# Patient Record
Sex: Female | Born: 1940
Health system: Southern US, Community
[De-identification: ages and names within clinical notes are randomized; demographics above are authoritative.]

## PROBLEM LIST (undated history)

## (undated) DIAGNOSIS — R1310 Dysphagia, unspecified: Secondary | ICD-10-CM

## (undated) DIAGNOSIS — C50919 Malignant neoplasm of unspecified site of unspecified female breast: Secondary | ICD-10-CM

## (undated) DIAGNOSIS — R011 Cardiac murmur, unspecified: Secondary | ICD-10-CM

## (undated) DIAGNOSIS — C801 Malignant (primary) neoplasm, unspecified: Secondary | ICD-10-CM

## (undated) DIAGNOSIS — E119 Type 2 diabetes mellitus without complications: Secondary | ICD-10-CM

## (undated) DIAGNOSIS — I499 Cardiac arrhythmia, unspecified: Secondary | ICD-10-CM

## (undated) DIAGNOSIS — I10 Essential (primary) hypertension: Secondary | ICD-10-CM

## (undated) DIAGNOSIS — G4730 Sleep apnea, unspecified: Secondary | ICD-10-CM

## (undated) DIAGNOSIS — F419 Anxiety disorder, unspecified: Secondary | ICD-10-CM

## (undated) DIAGNOSIS — T7840XA Allergy, unspecified, initial encounter: Secondary | ICD-10-CM

## (undated) HISTORY — DX: Sleep apnea, unspecified: G47.30

## (undated) HISTORY — DX: Type 2 diabetes mellitus without complications: E11.9

## (undated) HISTORY — DX: Dysphagia, unspecified: R13.10

## (undated) HISTORY — DX: Allergy, unspecified, initial encounter: T78.40XA

## (undated) HISTORY — DX: Cardiac murmur, unspecified: R01.1

## (undated) HISTORY — DX: Anxiety disorder, unspecified: F41.9

## (undated) HISTORY — DX: Essential (primary) hypertension: I10

## (undated) HISTORY — DX: Cardiac arrhythmia, unspecified: I49.9

## (undated) HISTORY — PX: BREAST EXCISIONAL BIOPSY: SUR124

---

## 2011-06-20 DIAGNOSIS — R05 Cough: Secondary | ICD-10-CM | POA: Insufficient documentation

## 2011-06-20 DIAGNOSIS — R059 Cough, unspecified: Secondary | ICD-10-CM | POA: Insufficient documentation

## 2011-06-20 DIAGNOSIS — I10 Essential (primary) hypertension: Secondary | ICD-10-CM | POA: Insufficient documentation

## 2012-03-15 DIAGNOSIS — M8580 Other specified disorders of bone density and structure, unspecified site: Secondary | ICD-10-CM | POA: Insufficient documentation

## 2012-12-29 DIAGNOSIS — M2390 Unspecified internal derangement of unspecified knee: Secondary | ICD-10-CM | POA: Insufficient documentation

## 2012-12-29 DIAGNOSIS — IMO0002 Reserved for concepts with insufficient information to code with codable children: Secondary | ICD-10-CM | POA: Insufficient documentation

## 2012-12-29 DIAGNOSIS — M1710 Unilateral primary osteoarthritis, unspecified knee: Secondary | ICD-10-CM | POA: Insufficient documentation

## 2013-10-13 DIAGNOSIS — E119 Type 2 diabetes mellitus without complications: Secondary | ICD-10-CM | POA: Insufficient documentation

## 2013-10-13 DIAGNOSIS — E1169 Type 2 diabetes mellitus with other specified complication: Secondary | ICD-10-CM | POA: Insufficient documentation

## 2014-05-12 HISTORY — PX: BREAST LUMPECTOMY: SHX2

## 2014-11-23 DIAGNOSIS — M1A00X0 Idiopathic chronic gout, unspecified site, without tophus (tophi): Secondary | ICD-10-CM | POA: Insufficient documentation

## 2015-02-15 DIAGNOSIS — D0512 Intraductal carcinoma in situ of left breast: Secondary | ICD-10-CM | POA: Insufficient documentation

## 2015-02-15 DIAGNOSIS — D241 Benign neoplasm of right breast: Secondary | ICD-10-CM | POA: Insufficient documentation

## 2015-06-12 DIAGNOSIS — E559 Vitamin D deficiency, unspecified: Secondary | ICD-10-CM | POA: Insufficient documentation

## 2015-06-13 DIAGNOSIS — Z86010 Personal history of colonic polyps: Secondary | ICD-10-CM

## 2015-06-13 DIAGNOSIS — Z860101 Personal history of adenomatous and serrated colon polyps: Secondary | ICD-10-CM

## 2015-06-13 HISTORY — DX: Personal history of adenomatous and serrated colon polyps: Z86.0101

## 2015-06-13 HISTORY — DX: Personal history of colonic polyps: Z86.010

## 2015-07-09 LAB — HM COLONOSCOPY

## 2015-07-17 DIAGNOSIS — K635 Polyp of colon: Secondary | ICD-10-CM | POA: Insufficient documentation

## 2015-07-17 LAB — HM COLONOSCOPY

## 2016-05-01 DIAGNOSIS — E1169 Type 2 diabetes mellitus with other specified complication: Secondary | ICD-10-CM | POA: Insufficient documentation

## 2016-05-01 DIAGNOSIS — E785 Hyperlipidemia, unspecified: Secondary | ICD-10-CM

## 2016-08-24 DIAGNOSIS — E669 Obesity, unspecified: Secondary | ICD-10-CM | POA: Insufficient documentation

## 2017-11-05 ENCOUNTER — Ambulatory Visit: Payer: Medicare Other | Admitting: Cardiovascular Disease

## 2017-11-05 ENCOUNTER — Encounter: Payer: Self-pay | Admitting: Cardiovascular Disease

## 2017-11-05 VITALS — BP 142/62 | HR 66 | Ht 63.0 in | Wt 192.0 lb

## 2017-11-05 DIAGNOSIS — I10 Essential (primary) hypertension: Secondary | ICD-10-CM

## 2017-11-05 DIAGNOSIS — R011 Cardiac murmur, unspecified: Secondary | ICD-10-CM

## 2017-11-05 DIAGNOSIS — E1169 Type 2 diabetes mellitus with other specified complication: Secondary | ICD-10-CM | POA: Diagnosis not present

## 2017-11-05 DIAGNOSIS — E785 Hyperlipidemia, unspecified: Secondary | ICD-10-CM

## 2017-11-05 NOTE — Patient Instructions (Signed)
Your physician recommends that you return for lab work FASTING to check cholesterol.  Your physician has requested that you have an echocardiogram @ [address] - [address]rd [address]. Echocardiography is a painless test that uses sound waves to create images of your heart. It provides your doctor with information about the size and shape of your heart and how well your heart's chambers and valves are working. This procedure takes approximately one hour. There are no restrictions for this procedure.  Your physician discussed the importance of regular exercise and recommended that you start or continue a regular exercise program for good health. Dr. Oval Linsey recommends a goal of 150 minutes of exercise each week.   Your physician recommends that you schedule a follow-up appointment in: [place] with Dr. Oval Linsey

## 2017-11-05 NOTE — Progress Notes (Signed)
Cardiology Office Note   Date:  11/07/2017   ID:  Eleora Sutherland, DOB 12-29-1940, MRN [id number]  PCP:  Deretha Emory, MD  Cardiologist:   Skeet Latch, MD   Chief Complaint  Patient presents with  . New Patient (Initial Visit)      History of Present Illness: Danayah Smyre is a 77 y.o. female with hypertension, breast cancer, and diabetes who is being seen today for the evaluation of a heart murmur at the request of Rodriguez-Southworth, S*.  Ms. Fittro recently moved to [place] from [place].  Her husband passed 02/2017 then her mother passed 06/2017.  She moved to be closer to her sons.  She established care with her PCP and was noted to have a murmur.  Her blood pressure was also elevated, but she was not taking her BP medication at that time.  After the passing of her husband and mother she wasn't taking great care of herself and was sporadic with her medication.  Lately she has been doing much better.  She is seeing a Social worker and is back on a positive course.  She hasn't been getting much exercise but denies exertional chest pain or shortness of breath.  She has mild edema that improves with elevation.  She denies orthopnea or PND.  Her BP at home fluctuates.  Ms. Bluett was seeing a cardiologist in [place] due to poorly controlled hypertension.  She had a stress test around 2017 that was reportedly negative for ischemia.  She was started on metoprolol which has helped her to feel less short of breath.  Past Medical History:  Diagnosis Date  . Diabetes mellitus without complication ([place])   . Hypertension   . Irregular heart beat     History reviewed. No pertinent surgical history.   Current Outpatient Medications  Medication Sig Dispense Refill  . allopurinol (ZYLOPRIM) 100 MG tablet Take 100 mg by mouth daily.  3  . aspirin 81 MG EC tablet Take 1 tablet by mouth daily.    . Biotin 10000 MCG TBDP 1 qd    . buPROPion (WELLBUTRIN XL) 300 MG 24 hr tablet TAKE  ONE TABLET (300 MG DOSE) BY MOUTH DAILY.  3  . cetirizine (ZYRTEC) 10 MG tablet TAKE ONE TABLET (10 MG TOTAL) BY MOUTH DAILY.  9  . Cholecalciferol (VITAMIN D3) 2000 units TABS Take by mouth.    Marland Kitchen glucose blood (ONE TOUCH ULTRA TEST) test strip TEST FASTING BLOOD SUGAR AND AND 2 HOURS AFTER MEALS    . losartan-hydrochlorothiazide (HYZAAR) 100-25 MG tablet Take by mouth.    . metFORMIN (GLUCOPHAGE-XR) 500 MG 24 hr tablet Take by mouth.    . metoprolol succinate (TOPROL-XL) 25 MG 24 hr tablet Take 25 mg by mouth daily.  3  . ONETOUCH DELICA LANCETS 03J MISC TEST GLUCOSE DAILY FASTING AND 2 HOURS AFTER MEALS     No current facility-administered medications for this visit.     Allergies:   Patient has no known allergies.    Social History:  The patient  reports that she has never smoked. She has never used smokeless tobacco. She reports that she does not use drugs.   Family History:  The patient's family history includes COPD in her mother; Cancer in her father; Heart attack in her maternal grandfather; Heart failure in her mother; Rectal cancer in her maternal grandmother.    ROS:  Please see the history of present illness.   Otherwise, review of systems are positive for hair  thinning.   All other systems are reviewed and negative.    PHYSICAL EXAM: VS:  BP (!) 142/62   Pulse 66   Ht 5\' 3"  (1.6 m)   Wt 192 lb (87.1 kg)   BMI 34.01 kg/m  , BMI Body mass index is 34.01 kg/m. GENERAL:  Well appearing HEENT:  Pupils equal round and reactive, fundi not visualized, oral mucosa unremarkable NECK:  No jugular venous distention, waveform within normal limits, carotid upstroke brisk and symmetric, no bruits, no thyromegaly LYMPHATICS:  No cervical adenopathy LUNGS:  Clear to auscultation bilaterally HEART:  RRR.  PMI not displaced or sustained,S1 and S2 within normal limits, no S3, no S4, no clicks, no rubs, III/VI systolic murmur at the LUSB ABD:  Flat, positive bowel sounds normal in  frequency in pitch, no bruits, no rebound, no guarding, no midline pulsatile mass, no hepatomegaly, no splenomegaly EXT:  2 plus pulses throughout, no edema, no cyanosis no clubbing SKIN:  No rashes no nodules NEURO:  Cranial nerves II through XII grossly intact, motor grossly intact throughout PSYCH:  Cognitively intact, oriented to person place and time    EKG:  EKG is not ordered today.  Recent Labs: No results found for requested labs within last 8760 hours.   10/15/2017: TSH 2.06, free T4 1.09 Hemoglobin A1c 6.6% Sodium 141, potassium 3.9, BUN 11, creatinine 0.83  Lipid Panel No results found for: CHOL, TRIG, HDL, CHOLHDL, VLDL, LDLCALC, LDLDIRECT    Wt Readings from Last 3 Encounters:  11/05/17 192 lb (87.1 kg)      ASSESSMENT AND PLAN:  # Murmur:  Systolic murmur on exam.  Concerning for aortic stenosis.  We will get an echo to assess.  She is asymptomatic.  # Hypertension: BP above goal.  She wants to work on diet and exercise before increasing medication. Continue losartan/HCTZ and metoprolol.  # CV Disease Prevention: Check fasting lipids.  Given her DM, she likely needs a statin.    Current medicines are reviewed at length with the patient today.  The patient does not have concerns regarding medicines.  The following changes have been made:  no change  Labs/ tests ordered today include:   Orders Placed This Encounter  Procedures  . Lipid panel  . ECHOCARDIOGRAM COMPLETE     Disposition:   FU with Jana Swartzlander C. Oval Linsey, MD, [facility] in 3 months.      Signed, Mariel Gaudin C. Oval Linsey, MD, [facility]  11/07/2017 11:32 PM    [place]

## 2017-11-07 ENCOUNTER — Encounter: Payer: Self-pay | Admitting: Cardiovascular Disease

## 2017-11-07 DIAGNOSIS — R011 Cardiac murmur, unspecified: Secondary | ICD-10-CM | POA: Insufficient documentation

## 2017-11-07 DIAGNOSIS — F32A Depression, unspecified: Secondary | ICD-10-CM | POA: Insufficient documentation

## 2017-11-07 DIAGNOSIS — F329 Major depressive disorder, single episode, unspecified: Secondary | ICD-10-CM | POA: Insufficient documentation

## 2017-11-17 ENCOUNTER — Other Ambulatory Visit: Payer: Self-pay

## 2017-11-17 ENCOUNTER — Ambulatory Visit (HOSPITAL_COMMUNITY): Payer: Medicare Other | Attending: Cardiovascular Disease

## 2017-11-17 DIAGNOSIS — Z6834 Body mass index (BMI) 34.0-34.9, adult: Secondary | ICD-10-CM | POA: Insufficient documentation

## 2017-11-17 DIAGNOSIS — E669 Obesity, unspecified: Secondary | ICD-10-CM | POA: Insufficient documentation

## 2017-11-17 DIAGNOSIS — E119 Type 2 diabetes mellitus without complications: Secondary | ICD-10-CM | POA: Insufficient documentation

## 2017-11-17 DIAGNOSIS — I10 Essential (primary) hypertension: Secondary | ICD-10-CM | POA: Diagnosis not present

## 2017-11-17 DIAGNOSIS — R011 Cardiac murmur, unspecified: Secondary | ICD-10-CM | POA: Diagnosis not present

## 2017-11-23 ENCOUNTER — Telehealth: Payer: Self-pay | Admitting: Cardiovascular Disease

## 2017-11-23 NOTE — Telephone Encounter (Signed)
Patient made aware of results and verbalized understanding.  Notes recorded by Skeet Latch, MD on 11/18/2017 at 2:16 PM EDT Echo shows that her heart is squeezing well. In fact it squeezes harder than average, which is likely why she has a murmur. No evidence of any significant valvular heart disease.

## 2017-11-23 NOTE — Telephone Encounter (Signed)
New Message     Patient returning nurses call for test results.

## 2018-01-21 DIAGNOSIS — R0683 Snoring: Secondary | ICD-10-CM

## 2018-01-21 DIAGNOSIS — Z1239 Encounter for other screening for malignant neoplasm of breast: Secondary | ICD-10-CM

## 2018-01-21 DIAGNOSIS — I10 Essential (primary) hypertension: Secondary | ICD-10-CM

## 2018-01-21 DIAGNOSIS — Z853 Personal history of malignant neoplasm of breast: Secondary | ICD-10-CM

## 2018-02-03 ENCOUNTER — Other Ambulatory Visit: Payer: Self-pay | Admitting: Internal Medicine

## 2018-02-03 DIAGNOSIS — Z1231 Encounter for screening mammogram for malignant neoplasm of breast: Secondary | ICD-10-CM

## 2018-02-09 ENCOUNTER — Ambulatory Visit: Payer: Medicare Other | Admitting: Cardiovascular Disease

## 2018-02-09 ENCOUNTER — Encounter: Payer: Self-pay | Admitting: Cardiovascular Disease

## 2018-02-09 VITALS — BP 168/80 | HR 54 | Ht 63.0 in | Wt 194.4 lb

## 2018-02-09 DIAGNOSIS — F331 Major depressive disorder, recurrent, moderate: Secondary | ICD-10-CM

## 2018-02-09 DIAGNOSIS — E7800 Pure hypercholesterolemia, unspecified: Secondary | ICD-10-CM | POA: Diagnosis not present

## 2018-02-09 DIAGNOSIS — I10 Essential (primary) hypertension: Secondary | ICD-10-CM | POA: Diagnosis not present

## 2018-02-09 DIAGNOSIS — R011 Cardiac murmur, unspecified: Secondary | ICD-10-CM

## 2018-02-09 MED ORDER — AMLODIPINE BESYLATE 5 MG PO TABS: 5.0000 mg | ORAL_TABLET | Freq: Every day | ORAL | 1 refills | Status: DC

## 2018-02-09 NOTE — Progress Notes (Signed)
Cardiology Office Note   Date:  02/09/2018   ID:  Jasmine Lopez, DOB [birth year], MRN [id number]  PCP:  Deretha Emory, MD  Cardiologist:   Skeet Latch, MD   No chief complaint on file.    History of Present Illness: Jasmine Lopez is a 77 y.o. female with hypertension, breast cancer, and diabetes here for follow up.  She was initially seen 10/2017 for heart murmur.  [name] recently moved to [place] from [place].  Her husband passed 02/2017 then her mother passed 06/2017.  She moved to be closer to her sons.  She established care with her PCP and was noted to have a murmur.  [name] was seeing a cardiologist in [place] due to poorly controlled hypertension.  She had a stress test around 2017 that was reportedly negative for ischemia.  She was started on metoprolol which has helped her to feel less short of breath.  She was referred for an echocardiogram 11/17/2017 that revealed LVEF 65 to 70% and no LVH or intracavitary gradient.  She had no valvular heart disease.  2 weeks ago she decided that her mood had been better so she stopped her Wellbutrin.  Since then she has been feeling very anxious and stressed.  She thinks this is because of the anniversary of her husband's passing.  She was encouraged to start exercising at her last appointment but has been unable to do so.  Her blood pressure has been persistently elevated.  It typically is in the 384Y systolic.  She saw her PCP 01/2018 and was started on atorvastatin due to hyperlipidemia.    Past Medical History:  Diagnosis Date  . Diabetes mellitus without complication ([place])   . Hypertension   . Irregular heart beat     History reviewed. No pertinent surgical history.   Current Outpatient Medications  Medication Sig Dispense Refill  . allopurinol (ZYLOPRIM) 100 MG tablet Take 100 mg by mouth daily.  3  . aspirin 81 MG EC tablet Take 1 tablet by mouth daily.    . Biotin 10000 MCG TBDP 1 qd    . buPROPion  (WELLBUTRIN XL) 300 MG 24 hr tablet TAKE ONE TABLET (300 MG DOSE) BY MOUTH DAILY.  3  . cetirizine (ZYRTEC) 10 MG tablet TAKE ONE TABLET (10 MG TOTAL) BY MOUTH DAILY.  9  . Cholecalciferol (VITAMIN D3) 2000 units TABS Take by mouth.    Marland Kitchen glucose blood (ONE TOUCH ULTRA TEST) test strip TEST FASTING BLOOD SUGAR AND AND 2 HOURS AFTER MEALS    . losartan-hydrochlorothiazide (HYZAAR) 100-25 MG tablet Take by mouth.    . metFORMIN (GLUCOPHAGE-XR) 500 MG 24 hr tablet Take by mouth.    . metoprolol succinate (TOPROL-XL) 25 MG 24 hr tablet Take 25 mg by mouth daily.  3  . ONETOUCH DELICA LANCETS 65L MISC TEST GLUCOSE DAILY FASTING AND 2 HOURS AFTER MEALS    . amLODipine (NORVASC) 5 MG tablet Take 1 tablet (5 mg total) by mouth daily. 90 tablet 1   No current facility-administered medications for this visit.     Allergies:   Patient has no known allergies.    Social History:  The patient  reports that she has never smoked. She has never used smokeless tobacco. She reports that she does not use drugs.   Family History:  The patient's family history includes COPD in her mother; Cancer in her father; Heart attack in her maternal grandfather; Heart failure in her mother; Rectal cancer in  her maternal grandmother.    ROS:  Please see the history of present illness.   Otherwise, review of systems are positive for hair thinning.   All other systems are reviewed and negative.    PHYSICAL EXAM: VS:  BP (!) 168/80   Pulse (!) 54   Ht 5\' 3"  (1.6 m)   Wt 194 lb 6.4 oz (88.2 kg)   BMI 34.44 kg/m  , BMI Body mass index is 34.44 kg/m. GENERAL:  Well appearing HEENT: Pupils equal round and reactive, fundi not visualized, oral mucosa unremarkable NECK:  No jugular venous distention, waveform within normal limits, carotid upstroke brisk and symmetric, no bruits, no thyromegaly LYMPHATICS:  No cervical adenopathy LUNGS:  Clear to auscultation bilaterally HEART:  RRR.  PMI not displaced or sustained,S1 and S2  within normal limits, no S3, no S4, no clicks, no rubs, II/VI systolic murmur at the LUSB ABD:  Flat, positive bowel sounds normal in frequency in pitch, no bruits, no rebound, no guarding, no midline pulsatile mass, no hepatomegaly, no splenomegaly EXT:  2 plus pulses throughout, no edema, no cyanosis no clubbing SKIN:  No rashes no nodules NEURO:  Cranial nerves II through XII grossly intact, motor grossly intact throughout PSYCH:  Cognitively intact, oriented to person place and time   EKG:  EKG is ordered today. 02/09/18: Sinus bradycardia.  Rate 54 bpm.  Nonspecific T wave abnormalities.  Echo 11/17/17: Study Conclusions  - Left ventricle: The cavity size was normal. Systolic function was   hyperdynamic. The estimated ejection fraction was in the range of   70% to 75%. Wall motion was normal; there were no regional wall   motion abnormalities. Features are consistent with a pseudonormal   left ventricular filling pattern, with concomitant abnormal   relaxation and increased filling pressure (grade 2 diastolic   dysfunction). - Mitral valve: Valve area by pressure half-time: 2.22 cm^2. - Left atrium: The atrium was moderately dilated. - Pulmonary arteries: PA peak pressure: 31 mm Hg (S).  Recent Labs: No results found for requested labs within last 8760 hours.   10/15/2017: TSH 2.06, free T4 1.09 Hemoglobin A1c 6.6% Sodium 141, potassium 3.9, BUN 11, creatinine 0.83  01/21/2018: Total cholesterol 198, triglycerides 101, HDL 55, LDL 123  Lipid Panel No results found for: CHOL, TRIG, HDL, CHOLHDL, VLDL, LDLCALC, LDLDIRECT    Wt Readings from Last 3 Encounters:  02/09/18 194 lb 6.4 oz (88.2 kg)  11/05/17 192 lb (87.1 kg)      ASSESSMENT AND PLAN:  # Murmur:  Systolic murmur likely 2/2 hyperdynamic LV.  No evidence of LVOT gradient or valvular disease.    # Hypertension: BP above goal.  She isn't really working on exercise.  She stopped her Wellbutrin and I think that  poorly controlled depression is making it hard for her to exercise.  I suggested that she restart it and talk to her primary care provider before stopping it in the future.  Continue metoprolol, losartan and HCTZ.  We will add amlodipine 5 mg daily.  # Hyperlipidemia: LDL 123 01/2018.  She was appropriately started on atorvastatin.  She will have repeat labs with her PCP in 3 months.   # Depression: Resume Wellbutrin as above.   Current medicines are reviewed at length with the patient today.  The patient does not have concerns regarding medicines.  The following changes have been made: Start amlodipine 5 mg daily.  Labs/ tests ordered today include:   Orders Placed This Encounter  Procedures  . EKG 12-Lead     Disposition:   FU with Shamarie Call C. Oval Linsey, MD, [facility] in 4 months.  PharmD in 1 month.     Signed, Freda Jaquith C. Oval Linsey, MD, [facility]  02/09/2018 5:31 PM    [place]

## 2018-02-09 NOTE — Patient Instructions (Signed)
Medication Instructions:  START AMLODIPINE 5 MG DAILY   Labwork: NONE  Testing/Procedures: NONE  Follow-Up: Your physician recommends that you schedule a follow-up appointment in: [place] physician wants you to follow-up in: [place] [facility]  You will receive a reminder letter in the mail two months in advance. If you don't receive a letter, please call our office to schedule the follow-up appointment.  If you need a refill on your cardiac medications before your next appointment, please call your pharmacy.

## 2018-02-18 ENCOUNTER — Other Ambulatory Visit: Payer: Self-pay | Admitting: Internal Medicine

## 2018-02-18 DIAGNOSIS — Z853 Personal history of malignant neoplasm of breast: Secondary | ICD-10-CM

## 2018-03-09 ENCOUNTER — Ambulatory Visit
Admission: RE | Admit: 2018-03-09 | Discharge: 2018-03-09 | Disposition: A | Discharge status: Home / Self Care | Payer: Medicare Other | Source: Ambulatory Visit | Attending: Internal Medicine | Admitting: Internal Medicine

## 2018-03-09 DIAGNOSIS — Z853 Personal history of malignant neoplasm of breast: Secondary | ICD-10-CM

## 2018-03-16 ENCOUNTER — Ambulatory Visit (INDEPENDENT_AMBULATORY_CARE_PROVIDER_SITE_OTHER): Payer: Medicare Other | Admitting: Pharmacist

## 2018-03-16 VITALS — BP 142/68 | HR 58 | Ht 63.0 in | Wt 194.6 lb

## 2018-03-16 DIAGNOSIS — I10 Essential (primary) hypertension: Secondary | ICD-10-CM

## 2018-03-16 MED ORDER — AMLODIPINE BESYLATE 10 MG PO TABS: 10.0000 mg | ORAL_TABLET | Freq: Every day | ORAL | 1 refills | Status: DC

## 2018-03-16 NOTE — Patient Instructions (Addendum)
Return for a  follow up appointment in 4 weeks  Check your blood pressure at home daily (if able) and keep record of the readings.  Take your BP meds as follows:  *STOP taking amlodipine 5mg * *START taking amlodipine 10mg  daily*  Bring all of your meds, your BP cuff and your record of home blood pressures to your next appointment.  Exercise as you're able, try to walk approximately 30 minutes per day.  Keep salt intake to a minimum, especially watch canned and prepared boxed foods.  Eat more fresh fruits and vegetables and fewer canned items.  Avoid eating in fast food restaurants.    HOW TO TAKE YOUR BLOOD PRESSURE: . Rest 5 minutes before taking your blood pressure. .  Don't smoke or drink caffeinated beverages for at least 30 minutes before. . Take your blood pressure before (not after) you eat. . Sit comfortably with your back supported and both feet on the floor (don't cross your legs). . Elevate your arm to heart level on a table or a desk. . Use the proper sized cuff. It should fit smoothly and snugly around your bare upper arm. There should be enough room to slip a fingertip under the cuff. The bottom edge of the cuff should be 1 inch above the crease of the elbow. . Ideally, take 3 measurements at one sitting and record the average.

## 2018-03-16 NOTE — Progress Notes (Signed)
Patient ID: Audreena Sachdeva                 DOB: 1940-06-13                      MRN: [id number]     HPI: Nury Nebergall is a 77 y.o. female referred by Dr. Oval Linsey to HTN clinic. PMH includes hypertension, breast cancer, murmur, diabetes, and depression. Denies headaches, swelling, no chest pain, or dizziness. She resumed Wellbutrin for depression management but still experiencing some symptoms of depression and difficulty getting out of the house. Reports walking a little bit more this week but not back to her normal regimen of walking 3-4 times per week.  Patient reports compliance with all medication as prescribed.  rrent HTN meds:  Amlodipine 5mg  daily (evening) Losartan-HCTZ 100-25mg  daily (evening) Metoprolol succinate 25mg  daily (evening)  BP goal: <130/80  Family History: patient's family history includes COPD in her mother; Cancer in her father; Heart attack in her maternal grandfather; Heart failure in her mother; Rectal cancer in her maternal grandmother.   Social History: patient  reports that she has never smoked. She has never used smokeless tobacco. She reports that she does not use drugs.   Diet: eating less fatty food and less salty foods. Going out less.  Exercise: not walking lately  Home BP readings: no records available; 150/70 pulse 66 few days ago per patient recollection  Wt Readings from Last 3 Encounters:  03/16/18 194 lb 9.6 oz (88.3 kg)  02/09/18 194 lb 6.4 oz (88.2 kg)  11/05/17 192 lb (87.1 kg)   BP Readings from Last 3 Encounters:  03/16/18 (!) 142/68  02/09/18 (!) 168/80  11/05/17 (!) 142/62   Pulse Readings from Last 3 Encounters:  03/16/18 (!) 58  02/09/18 (!) 54  11/05/17 66    Past Medical History:  Diagnosis Date  . Diabetes mellitus without complication ([place])   . Hypertension   . Irregular heart beat     Current Outpatient Medications on File Prior to Visit  Medication Sig Dispense Refill  . allopurinol (ZYLOPRIM) 100 MG tablet  Take 100 mg by mouth daily.  3  . aspirin 81 MG EC tablet Take 1 tablet by mouth daily.    . Biotin 10000 MCG TBDP 1 qd    . buPROPion (WELLBUTRIN XL) 300 MG 24 hr tablet TAKE ONE TABLET (300 MG DOSE) BY MOUTH DAILY.  3  . cetirizine (ZYRTEC) 10 MG tablet TAKE ONE TABLET (10 MG TOTAL) BY MOUTH DAILY.  9  . Cholecalciferol (VITAMIN D3) 2000 units TABS Take by mouth.    Marland Kitchen glucose blood (ONE TOUCH ULTRA TEST) test strip TEST FASTING BLOOD SUGAR AND AND 2 HOURS AFTER MEALS    . losartan-hydrochlorothiazide (HYZAAR) 100-25 MG tablet Take by mouth.    . metFORMIN (GLUCOPHAGE-XR) 500 MG 24 hr tablet Take by mouth.    . metoprolol succinate (TOPROL-XL) 25 MG 24 hr tablet Take 25 mg by mouth daily.  3  . ONETOUCH DELICA LANCETS 51W MISC TEST GLUCOSE DAILY FASTING AND 2 HOURS AFTER MEALS     No current facility-administered medications on file prior to visit.     No Known Allergies  Blood pressure (!) 142/68, pulse (!) 58, height 5\' 3"  (1.6 m), weight 194 lb 9.6 oz (88.3 kg).  HTN (hypertension) BP remains above desired goal of <130/80 but improved from previous readings. Patient still working on positive lifestyle modifications but having problems to achieve her  goals due to depression. PCP is adjusting her medication for depression and she is trying to eat better and walk more.  Will increase amlodipine dose to 10mg  daily and follow up with HTN clinic in 4 weeks.  Brooke Steinhilber Rodriguez-Guzman PharmD, BCPS, [place] [address] [place] [address] 03/17/2018 7:59 AM

## 2018-03-17 ENCOUNTER — Encounter: Payer: Self-pay | Admitting: Pharmacist

## 2018-03-17 NOTE — Assessment & Plan Note (Signed)
BP remains above desired goal of <130/80 but improved from previous readings. Patient still working on positive lifestyle modifications but having problems to achieve her goals due to depression. PCP is adjusting her medication for depression and she is trying to eat better and walk more.  Will increase amlodipine dose to 10mg  daily and follow up with HTN clinic in 4 weeks.

## 2018-04-08 ENCOUNTER — Other Ambulatory Visit: Payer: Self-pay | Admitting: Cardiovascular Disease

## 2018-04-12 MED ORDER — AMLODIPINE BESYLATE 5 MG PO TABS: 5.0000 mg | ORAL_TABLET | Freq: Every day | ORAL | 3 refills | Status: DC

## 2018-04-13 ENCOUNTER — Ambulatory Visit (INDEPENDENT_AMBULATORY_CARE_PROVIDER_SITE_OTHER): Payer: Medicare Other | Admitting: Pharmacist Clinician (PhC)/ Clinical Pharmacy Specialist

## 2018-04-13 DIAGNOSIS — I10 Essential (primary) hypertension: Secondary | ICD-10-CM | POA: Diagnosis not present

## 2018-04-13 NOTE — Progress Notes (Signed)
Patient ID: Jasmine Lopez                 DOB: 1940/12/10                      MRN: [id number]     HPI: Jasmine Lopez is a 77 y.o. female referred by Dr. Oval Lopez to HTN clinic. PMH includes hypertension, breast cancer, murmur, diabetes, and depression.   Today she is here for follow up and reports feeling much better in the past couple of weeks.  She has managed to get her depression under some control and has been walking more recently.    At her last visit with CVRR, her amlodipine was increased from 5 mg to 10 mg daily.  At that time she took all of her medications at bedtime, but read somewhere that morning was the best, so switched them all to mornings about 10 days ago.  She admits she has not noticed any change in her home blood pressure readings.  Today she denies headaches, swelling, lower extremity edema, chest pain, or dizziness.  Patient reports compliance with all medication as prescribed.  Current HTN meds:  Amlodipine 10mg  daily (am) Losartan-HCTZ 100-25mg  daily (am) - now in 2 tabs - combo currently on backorder at her pharmacy Metoprolol succinate 25mg  daily (am)  BP goal: <130/80  Family History: patient's family history includes COPD in her mother; Cancer in her father; Heart attack in her maternal grandfather; Heart failure in her mother; Rectal cancer in her maternal grandmother.   Social History: patient  reports that she has never smoked. She has never used smokeless tobacco. She reports that she does not use drugs.   Has been drinking 3 mugs of coffee per day recently, but has cut back.  Also likes Coke/Pepsi, still 2-3 cans per week.  No alcohol  Diet: eating less fatty food and less salty foods. Has been eating out less and trying to monitor her diet more carefully  Exercise: started walking in [place] so she can be out of the weather  Home BP readings: no records available;   Wt Readings from Last 3 Encounters:  03/16/18 194 lb 9.6 oz (88.3 kg)    02/09/18 194 lb 6.4 oz (88.2 kg)  11/05/17 192 lb (87.1 kg)   BP Readings from Last 3 Encounters:  03/16/18 (!) 142/68  02/09/18 (!) 168/80  11/05/17 (!) 142/62   Pulse Readings from Last 3 Encounters:  03/16/18 (!) 58  02/09/18 (!) 54  11/05/17 66    Past Medical History:  Diagnosis Date  . Diabetes mellitus without complication ([place])   . Hypertension   . Irregular heart beat     Current Outpatient Medications on File Prior to Visit  Medication Sig Dispense Refill  . allopurinol (ZYLOPRIM) 100 MG tablet Take 100 mg by mouth daily.  3  . amLODipine (NORVASC) 10 MG tablet Take 1 tablet (10 mg total) by mouth daily. 30 tablet 1  . aspirin 81 MG EC tablet Take 1 tablet by mouth daily.    . Biotin 10000 MCG TBDP 1 qd    . buPROPion (WELLBUTRIN XL) 300 MG 24 hr tablet TAKE ONE TABLET (300 MG DOSE) BY MOUTH DAILY.  3  . cetirizine (ZYRTEC) 10 MG tablet TAKE ONE TABLET (10 MG TOTAL) BY MOUTH DAILY.  9  . Cholecalciferol (VITAMIN D3) 2000 units TABS Take by mouth.    Marland Kitchen glucose blood (ONE TOUCH ULTRA TEST) test strip TEST  FASTING BLOOD SUGAR AND AND 2 HOURS AFTER MEALS    . losartan-hydrochlorothiazide (HYZAAR) 100-25 MG tablet Take by mouth.    . metFORMIN (GLUCOPHAGE-XR) 500 MG 24 hr tablet Take by mouth.    . metoprolol succinate (TOPROL-XL) 25 MG 24 hr tablet Take 25 mg by mouth daily.  3  . ONETOUCH DELICA LANCETS 69I MISC TEST GLUCOSE DAILY FASTING AND 2 HOURS AFTER MEALS    . amLODipine (NORVASC) 5 MG tablet Take 1 tablet (5 mg total) by mouth daily. (Patient not taking: Reported on 04/13/2018) 90 tablet 3   No current facility-administered medications on file prior to visit.     No Known Allergies  There were no vitals taken for this visit.  No problem-specific Assessment & Plan notes found for this encounter.  Tommy Medal PharmD CPP [facility] [address] [place] [address] 04/13/2018 10:16 AM

## 2018-04-13 NOTE — Patient Instructions (Addendum)
Return for a a follow up appointment in 6 weeks  Your blood pressure today is 136/62  Check your blood pressure at home daily and keep record of the readings.  Take your BP meds as follows:  AM;  Losartan 100 mg, Hydrochlorothiazide 25 mg  PM:  Amlodipine 10 mg, metoprolol 25 mg  Bring all of your meds, your BP cuff and your record of home blood pressures to your next appointment.  Exercise as you're able, try to walk approximately 30 minutes per day.  Keep salt intake to a minimum, especially watch canned and prepared boxed foods.  Eat more fresh fruits and vegetables and fewer canned items.  Avoid eating in fast food restaurants.    HOW TO TAKE YOUR BLOOD PRESSURE: . Rest 5 minutes before taking your blood pressure. .  Don't smoke or drink caffeinated beverages for at least 30 minutes before. . Take your blood pressure before (not after) you eat. . Sit comfortably with your back supported and both feet on the floor (don't cross your legs). . Elevate your arm to heart level on a table or a desk. . Use the proper sized cuff. It should fit smoothly and snugly around your bare upper arm. There should be enough room to slip a fingertip under the cuff. The bottom edge of the cuff should be 1 inch above the crease of the elbow. . Ideally, take 3 measurements at one sitting and record the average.

## 2018-04-15 ENCOUNTER — Other Ambulatory Visit: Payer: Self-pay | Admitting: Internal Medicine

## 2018-04-15 NOTE — Progress Notes (Signed)
I received a fax from Dr Hyman Hopes chiropractor at [facility] who is not improving from her back pain with adjustments and he recommends MRI of her Thoracic spine without contrast.  Hx f breast CA stage 1. Pt needs to come in for office visit, I asked Tiana to call her and make her apt.

## 2018-04-15 NOTE — Assessment & Plan Note (Signed)
Today her blood pressure is almost to the goal of < 130/80.  I am going to divide her medications so that she takes losartan and hctz in the mornings, amlodipine and metoprolol at night.  She was also given a log sheet with specific instructions on how to check her home blood pressure regularly for [place].  We will see her back in 6-8 weeks for follow up.  She was praised on her increase in physical activity and dietary restraints, especially at this time of the year.

## 2018-04-22 ENCOUNTER — Ambulatory Visit: Payer: Medicare Other | Admitting: Internal Medicine

## 2018-04-22 ENCOUNTER — Encounter: Payer: Self-pay | Admitting: Internal Medicine

## 2018-04-22 VITALS — BP 120/70 | HR 60 | Temp 97.7°F | Ht 63.0 in | Wt 194.8 lb

## 2018-04-22 DIAGNOSIS — R21 Rash and other nonspecific skin eruption: Secondary | ICD-10-CM | POA: Diagnosis not present

## 2018-04-22 DIAGNOSIS — E1165 Type 2 diabetes mellitus with hyperglycemia: Secondary | ICD-10-CM

## 2018-04-22 DIAGNOSIS — I10 Essential (primary) hypertension: Secondary | ICD-10-CM

## 2018-04-22 DIAGNOSIS — M546 Pain in thoracic spine: Secondary | ICD-10-CM | POA: Diagnosis not present

## 2018-04-22 NOTE — Patient Instructions (Addendum)
TRY  STEVIA  OR MONK FRUIT SWEATER WHICH ARE NATURAL SWEETNERS AND NOT ASSOCIATED WITH CANCER.   TRY CHIA SEED  PUDDIN  TRY HYDROCORTISONE CREAM 1% ON YOUR NECK TWICE A DAY FOR 7 DAYS, THEN ONLY AS NEEDED FOR THE NECK RASH Hives Hives (urticaria) are itchy, red, swollen areas on your skin. Hives can show up on any part of your body, and they can vary in size. They can be as small as the tip of a pen or much larger. Hives often fade within 24 hours (acute hives). In other cases, new hives show up after old ones fade. This can continue for many days or weeks (chronic hives). Hives are caused by your body's reaction to an irritant or to something that you are allergic to (trigger). You can get hives right after being around a trigger or hours later. Hives do not spread from person to person (are not contagious). Hives may get worse if you scratch them, if you exercise, or if you have worries (emotional stress). Follow these instructions at home: Medicines  Take or apply over-the-counter and prescription medicines only as told by your doctor.  If you were prescribed an antibiotic medicine, use it as told by your doctor. Do not stop taking the antibiotic even if you start to feel better. Skin Care  Apply cool, wet cloths (cool compresses) to the itchy, red, swollen areas.  Do not scratch your skin. Do not rub your skin. General instructions  Do not take hot showers or baths. This can make itching worse.  Do not wear tight clothes.  Use sunscreen and wear clothing that covers your skin when you are outside.  Avoid any triggers that cause your hives. Keep a journal to help you keep track of what causes your hives. Write down: ? What medicines you take. ? What you eat and drink. ? What products you use on your skin.  Keep all follow-up visits as told by your doctor. This is important. Contact a doctor if:  Your symptoms are not better with medicine.  Your joints are painful or  swollen. Get help right away if:  You have a fever.  You have belly pain.  Your tongue or lips are swollen.  Your eyelids are swollen.  Your chest or throat feels tight.  You have trouble breathing or swallowing. These symptoms may be an emergency. Do not wait to see if the symptoms will go away. Get medical help right away. Call your local emergency services (911 in the [place] Do not drive yourself to the hospital. This information is not intended to replace advice given to you by your health care provider. Make sure you discuss any questions you have with your health care provider. Document Released: 02/05/2008 Document Revised: 10/04/2015 Document Reviewed: 02/14/2015 Elsevier Interactive Patient Education  2018 [employer].

## 2018-04-22 NOTE — Progress Notes (Addendum)
Subjective:     Patient ID: Jasmine Lopez , female    DOB: [birth year] , 77 y.o.   MRN: [id number]   Chief Complaint  Patient presents with  . Hypertension  . Rash    around neck     HPI 1-Pt is here for HTN FU     She met with Dr Atilano Median office and discussed how totake her medications and was split bid instead of taking only ones in the am.  120-136/70 Her glucose has been 125.  2- itchy neck qhs only and gets a rash. She has not been wearing any new jewelry or new clothing. She has not changed her face lotion. Does not get a rash anywhere else.  3- The last time I saw her, we discussed about her getting a sleeping test. She has heard from Neurology for sleep test, but needs to re-schedule this. 4- She has been going to a chiropractor  And has been getting great results with less pain and flexibility, but the Dr is concerned because her mid thoracic pain is not improving and has been 3 months since she has been having visits with them and due to her past hx of breast CA, her chiropractor recommended she gets thoracic MRI and sent a rx with written instruction.   Past Medical History:  Diagnosis Date  . Diabetes mellitus without complication ([place])   . Hypertension   . Irregular heart beat      Family History  Problem Relation Age of Onset  . Heart failure Mother   . COPD Mother   . Cancer Father   . Rectal cancer Maternal Grandmother   . Heart attack Maternal Grandfather     Current Outpatient Medications:  .  allopurinol (ZYLOPRIM) 100 MG tablet, Take 100 mg by mouth daily., Disp: , Rfl: 3 .  amLODipine (NORVASC) 10 MG tablet, Take 1 tablet (10 mg total) by mouth daily., Disp: 30 tablet, Rfl: 1 .  aspirin 81 MG EC tablet, Take 1 tablet by mouth daily., Disp: , Rfl:  .  atorvastatin (LIPITOR) 20 MG tablet, Take by mouth., Disp: , Rfl:  .  Biotin 10000 MCG TBDP, 1 qd, Disp: , Rfl:  .  buPROPion (WELLBUTRIN XL) 300 MG 24 hr tablet, TAKE ONE TABLET (300 MG DOSE) BY MOUTH  DAILY., Disp: , Rfl: 3 .  cetirizine (ZYRTEC) 10 MG tablet, TAKE ONE TABLET (10 MG TOTAL) BY MOUTH DAILY., Disp: , Rfl: 9 .  Cholecalciferol (VITAMIN D3) 2000 units TABS, Take by mouth., Disp: , Rfl:  .  glucose blood (ONE TOUCH ULTRA TEST) test strip, TEST FASTING BLOOD SUGAR AND AND 2 HOURS AFTER MEALS, Disp: , Rfl:  .  losartan-hydrochlorothiazide (HYZAAR) 100-25 MG tablet, Take by mouth., Disp: , Rfl:  .  metFORMIN (GLUCOPHAGE-XR) 500 MG 24 hr tablet, Take by mouth., Disp: , Rfl:  .  metoprolol succinate (TOPROL-XL) 25 MG 24 hr tablet, Take 25 mg by mouth daily., Disp: , Rfl: 3 .  ONETOUCH DELICA LANCETS 25W MISC, TEST GLUCOSE DAILY FASTING AND 2 HOURS AFTER MEALS, Disp: , Rfl:    No Known Allergies   Review of Systems  Constitutional: Negative for appetite change, chills, diaphoresis and fever.  HENT: Negative for tinnitus.   Eyes: Negative for discharge.  Respiratory: Positive for cough. Negative for chest tightness and shortness of breath.        Has been coughing a lot and feels a tickle in her throat off and on, She feels PND, and  drinking water makes it go away.   Cardiovascular: Negative for chest pain, palpitations and leg swelling.       Knee highs support is helping with prevention of edema.   Musculoskeletal: Positive for back pain. Negative for gait problem.  Skin: Positive for rash. Negative for wound.  Neurological: Negative for headaches.       She hit the back on her L occipital scalp 2 weeks ago, no LOC, no N/V, the area was just sore to touch.   Hematological: Negative for adenopathy.  Psychiatric/Behavioral:       + snoring   Today's Vitals   04/22/18 1414  BP: 120/70  Pulse: 60  Temp: 97.7 F (36.5 C)  TempSrc: Oral  SpO2: 95%  Weight: 194 lb 12.8 oz (88.4 kg)  Height: '5\' 3"'  (1.6 m)   Body mass index is 34.51 kg/m.   Objective:  Physical Exam  Constitutional: She is oriented to person, place, and time. She appears well-developed and well-nourished.  No distress.  HENT:  Head: Normocephalic and atraumatic.  Right Ear: External ear normal.  Left Ear: External ear normal.  Nose: Nose normal.  Eyes: Conjunctivae are normal. Right eye exhibits no discharge. Left eye exhibits no discharge. No scleral icterus.  Neck: Neck supple. No thyromegaly present.  No carotid bruits bilaterally  Cardiovascular: Normal rate and regular rhythm.  2/6 murmur heard. Pulmonary/Chest: Effort normal and breath sounds normal. No respiratory distress.  SKIN- mild hives on lower neck only, none on face or thorax.  Musculoskeletal: Normal range of motion. She exhibits no edema.  Lymphadenopathy: She has no cervical adenopathy.  Neurological: She is alert and oriented to person, place, and time.  Skin: Skin is warm and dry. Capillary refill takes less than 2 seconds. She has fine hives on her anterior neck, but none on face or arms. She is not diaphoretic.  Psychiatric: She has a normal mood and affect. Her behavior is normal. Judgment and thought content normal.  Back- has point tenderness on mid thoracic vertebral region Nursing note reviewed. Assessment And Plan:    1. Thoracic spine pain-  - MR Thoracic Spine Wo Contrast; Future  2. Essential hypertension-  - CBC no Diff - CMP14 + Anion Gap  3. Uncontrolled type 2 diabetes mellitus with hyperglycemia ([place])- chronic - Hemoglobin A1c She will continue same meds. We will inform her of her results when they are back.  4- Rash- possibly some allergic reaction. May take Claritin prn and use hydrocortisone over the counter bid as needed.  [name], PA-C

## 2018-04-23 ENCOUNTER — Other Ambulatory Visit: Payer: Self-pay | Admitting: Internal Medicine

## 2018-04-23 LAB — CBC
Hematocrit: 38 % (ref 34.0–46.6)
Hemoglobin: 13.2 g/dL (ref 11.1–15.9)
MCH: 30.2 pg (ref 26.6–33.0)
MCHC: 34.7 g/dL (ref 31.5–35.7)
MCV: 87 fL (ref 79–97)
Platelets: 262 10*3/uL (ref 150–450)
RBC: 4.37 x10E6/uL (ref 3.77–5.28)
RDW: 12.8 % (ref 12.3–15.4)
WBC: 5.6 10*3/uL (ref 3.4–10.8)

## 2018-04-23 LAB — CMP14 + ANION GAP
ALT: 19 IU/L (ref 0–32)
AST: 16 IU/L (ref 0–40)
Albumin/Globulin Ratio: 1.6 (ref 1.2–2.2)
Albumin: 4.1 g/dL (ref 3.5–4.8)
Alkaline Phosphatase: 78 IU/L (ref 39–117)
Anion Gap: 14 mmol/L (ref 10.0–18.0)
BUN/Creatinine Ratio: 15 (ref 12–28)
BUN: 13 mg/dL (ref 8–27)
Bilirubin Total: 0.3 mg/dL (ref 0.0–1.2)
CO2: 24 mmol/L (ref 20–29)
Calcium: 9.3 mg/dL (ref 8.7–10.3)
Chloride: 102 mmol/L (ref 96–106)
Creatinine, Ser: 0.84 mg/dL (ref 0.57–1.00)
GFR calc Af Amer: 79 mL/min/{1.73_m2} (ref >59)
GFR calc non Af Amer: 68 mL/min/{1.73_m2} (ref >59)
Globulin, Total: 2.6 g/dL (ref 1.5–4.5)
Glucose: 105 mg/dL — ABNORMAL HIGH (ref 65–99)
Potassium: 4.4 mmol/L (ref 3.5–5.2)
Sodium: 140 mmol/L (ref 134–144)
Total Protein: 6.7 g/dL (ref 6.0–8.5)

## 2018-04-23 LAB — HEMOGLOBIN A1C
Est. average glucose Bld gHb Est-mCnc: 157 mg/dL
Hgb A1c MFr Bld: 7.1 % — ABNORMAL HIGH (ref 4.8–5.6)

## 2018-04-27 ENCOUNTER — Other Ambulatory Visit: Payer: Self-pay

## 2018-04-29 ENCOUNTER — Other Ambulatory Visit: Payer: Self-pay

## 2018-04-29 MED ORDER — METFORMIN HCL ER 500 MG PO TB24: 500.0000 mg | ORAL_TABLET | Freq: Two times a day (BID) | ORAL | 0 refills | Status: DC

## 2018-05-10 ENCOUNTER — Encounter: Payer: Self-pay | Admitting: Internal Medicine

## 2018-05-25 ENCOUNTER — Ambulatory Visit: Payer: Medicare Other

## 2018-05-26 ENCOUNTER — Other Ambulatory Visit: Payer: Self-pay

## 2018-05-26 MED ORDER — ONETOUCH ULTRA MINI W/DEVICE KIT: PACK | 1 refills | Status: DC

## 2018-06-01 ENCOUNTER — Ambulatory Visit (INDEPENDENT_AMBULATORY_CARE_PROVIDER_SITE_OTHER): Payer: Medicare Other | Admitting: Pharmacist Clinician (PhC)/ Clinical Pharmacy Specialist

## 2018-06-01 DIAGNOSIS — I10 Essential (primary) hypertension: Secondary | ICD-10-CM | POA: Diagnosis not present

## 2018-06-01 MED ORDER — VALSARTAN 160 MG PO TABS: 160.0000 mg | ORAL_TABLET | Freq: Every day | ORAL | 3 refills | Status: DC

## 2018-06-01 MED ORDER — CHLORTHALIDONE 25 MG PO TABS: 12.5000 mg | ORAL_TABLET | Freq: Every day | ORAL | 3 refills | Status: DC

## 2018-06-01 NOTE — Patient Instructions (Signed)
Return for a a follow up appointment with Dr. Oval Linsey on Feb 3  Your blood pressure today is 176/90  Check your blood pressure at home daily (if able) and keep record of the readings.  Take your BP meds as follows:  AM: valsartan 160 mg, chlorthalidone 12.5 mg (1/2 tablet)  PM: amlodipine 10 mg, metoprolol succ 25 mg  Bring all of your meds, your BP cuff and your record of home blood pressures to your next appointment.  Exercise as you're able, try to walk approximately 30 minutes per day.  Keep salt intake to a minimum, especially watch canned and prepared boxed foods.  Eat more fresh fruits and vegetables and fewer canned items.  Avoid eating in fast food restaurants.    HOW TO TAKE YOUR BLOOD PRESSURE: . Rest 5 minutes before taking your blood pressure. .  Don't smoke or drink caffeinated beverages for at least 30 minutes before. . Take your blood pressure before (not after) you eat. . Sit comfortably with your back supported and both feet on the floor (don't cross your legs). . Elevate your arm to heart level on a table or a desk. . Use the proper sized cuff. It should fit smoothly and snugly around your bare upper arm. There should be enough room to slip a fingertip under the cuff. The bottom edge of the cuff should be 1 inch above the crease of the elbow. . Ideally, take 3 measurements at one sitting and record the average.

## 2018-06-01 NOTE — Progress Notes (Signed)
Patient ID: Jasmine Lopez                 DOB: 10/06/1940                      MRN: [id number]     HPI: Jasmine Lopez is a 78 y.o. female referred by Dr. Oval Linsey to HTN clinic. PMH includes hypertension, breast cancer, murmur, diabetes, and depression.   Today she is here for follow up and reports feeling much better in the past couple of weeks.  She has managed to get her depression under some control and has been walking more recently.    At her last visit with [facility], her amlodipine was increased from 5 mg to 10 mg daily.  At that time she took all of her medications at bedtime, but read somewhere that morning was the best, so switched them all to mornings about 10 days ago.  She admits she has not noticed any change in her home blood pressure readings.  Today she denies headaches, swelling, lower extremity edema, chest pain, or dizziness.  Patient reports compliance with all medication as prescribed. Has gained weight over holidays  Was without losartan and hctz for about 1 week, ran out, pharmacy didn't refill, didn't notice.     Current HTN meds:  Amlodipine 59m daily (am) Losartan-HCTZ 100-231mdaily (am) - now in 2 tabs - combo currently on backorder at her pharmacy Metoprolol succinate 2537maily (am)  BP goal: <130/80  Family History: patient's family history includes COPD in her mother; Cancer in her father; Heart attack in her maternal grandfather; Heart failure in her mother; Rectal cancer in her maternal grandmother.   Social History: patient  reports that she has never smoked. She has never used smokeless tobacco. She reports that she does not use drugs.   Has been drinking 3 mugs of coffee per day recently, but has cut back.  Also likes Coke/Pepsi, still 2-3 cans per week.  No alcohol  Diet: eating less fatty food and less salty foods. Has been eating out less and trying to monitor her diet more carefully  Exercise[place] be [place] of  [place] weather  Home BP readings: no records available;  Wt Readings from Last 3 Encounters:  04/22/18 194 lb 12.8 oz (88.4 kg)  03/16/18 194 lb 9.6 oz (88.3 kg)  02/09/18 194 lb 6.4 oz (88.2 kg)   BP Readings from Last 3 Encounters:  06/01/18 (!) 176/90  04/22/18 120/70  04/13/18 136/62   Pulse Readings from Last 3 Encounters:  06/01/18 60  04/22/18 60  04/13/18 75    Past Medical History:  Diagnosis Date  . Diabetes mellitus without complication ([place] . Hypertension   . Irregular heart beat     Current Outpatient Medications on File Prior to Visit  Medication Sig Dispense Refill  . aspirin 81 MG EC tablet Take 1 tablet by mouth daily.    . Blood Glucose Monitoring Suppl (ONE TOUCH ULTRA MINI) w/Device KIT Use as directed to check blood sugars 2 times per day dx:e11.65 1 each 1  . glucose blood (ONE TOUCH ULTRA TEST) test strip TEST FASTING BLOOD SUGAR AND AND 2 HOURS AFTER MEALS    . metFORMIN (GLUCOPHAGE-XR) 500 MG 24 hr tablet Take 1 tablet (500 mg total) by mouth 2 (two) times daily. 180 tablet 0  . ONETOUCH DELICA LANCETS [id number] TEST GLUCOSE DAILY FASTING AND 2  HOURS AFTER MEALS    . allopurinol (ZYLOPRIM) 100 MG tablet Take 100 mg by mouth daily.  3  . amLODipine (NORVASC) 10 MG tablet Take 1 tablet (10 mg total) by mouth daily. (Patient not taking: Reported on 06/01/2018) 30 tablet 1  . atorvastatin (LIPITOR) 20 MG tablet Take by mouth.    . Biotin 10000 MCG TBDP 1 qd    . buPROPion (WELLBUTRIN XL) 300 MG 24 hr tablet TAKE ONE TABLET (300 MG DOSE) BY MOUTH DAILY.  3  . cetirizine (ZYRTEC) 10 MG tablet TAKE ONE TABLET (10 MG TOTAL) BY MOUTH DAILY.  9  . Cholecalciferol (VITAMIN D3) 2000 units TABS Take by mouth.    . hydrochlorothiazide (HYDRODIURIL) 50 MG tablet Take 50 mg by mouth daily.    Marland Kitchen losartan (COZAAR) 50 MG tablet Take 50 mg by mouth daily.    Marland Kitchen losartan-hydrochlorothiazide (HYZAAR) 100-25 MG tablet Take by mouth.    . metoprolol succinate (TOPROL-XL) 25  MG 24 hr tablet Take 25 mg by mouth daily.  3   No current facility-administered medications on file prior to visit.     No Known Allergies  Blood pressure (!) 176/90, pulse 60, resp. rate 14.  HTN (hypertension) Patient with uncontrolled blood pressure today in the office, most likely related to being without losartan and hctz for about 1 week.  Will switch her today to valsartan 160 mg and chlorthalidone 12.5 mg, both once daily.  Hopefully we will see her approach goal readings with this combination, in addition to her amlodipine and metoprolol.  She was encouraged to exercise, has [employer], but has not yet used it.  Suggested she go to her local [employer] and join the group.  She is scheduled to see Dr. Oval Linsey in early February, and we can see her soon afterward if further follow-up is needed.    Tommy Medal PharmD CPP [facility] [address] [place] [address] 06/02/2018 8:30 AM

## 2018-06-02 ENCOUNTER — Encounter: Payer: Self-pay | Admitting: Pharmacist Clinician (PhC)/ Clinical Pharmacy Specialist

## 2018-06-02 NOTE — Assessment & Plan Note (Signed)
Patient with uncontrolled blood pressure today in the office, most likely related to being without losartan and hctz for about 1 week.  Will switch her today to valsartan 160 mg and chlorthalidone 12.5 mg, both once daily.  Hopefully we will see her approach goal readings with this combination, in addition to her amlodipine and metoprolol.  She was encouraged to exercise, has [employer], but has not yet used it.  Suggested she go to her local [employer] and join the group.  She is scheduled to see Dr. Oval Linsey in early February, and we can see her soon afterward if further follow-up is needed.

## 2018-06-03 ENCOUNTER — Other Ambulatory Visit: Payer: Self-pay | Admitting: Internal Medicine

## 2018-06-03 DIAGNOSIS — E1165 Type 2 diabetes mellitus with hyperglycemia: Secondary | ICD-10-CM

## 2018-06-03 MED ORDER — BLOOD GLUCOSE MONITOR KIT: PACK | 0 refills | Status: DC

## 2018-06-08 ENCOUNTER — Other Ambulatory Visit: Payer: Self-pay

## 2018-06-08 MED ORDER — CONTOUR BLOOD GLUCOSE SYSTEM W/DEVICE KIT: PACK | 0 refills | Status: DC

## 2018-06-14 ENCOUNTER — Ambulatory Visit: Payer: Medicare Other | Admitting: Cardiovascular Disease

## 2018-06-15 ENCOUNTER — Ambulatory Visit
Admission: RE | Admit: 2018-06-15 | Discharge: 2018-06-15 | Disposition: A | Discharge status: Home / Self Care | Payer: Medicare Other | Source: Ambulatory Visit | Attending: Internal Medicine | Admitting: Internal Medicine

## 2018-06-15 ENCOUNTER — Ambulatory Visit: Payer: Medicare Other | Admitting: Internal Medicine

## 2018-06-15 ENCOUNTER — Encounter: Payer: Self-pay | Admitting: Internal Medicine

## 2018-06-15 VITALS — BP 142/92 | HR 64 | Temp 97.9°F | Ht 63.0 in | Wt 195.6 lb

## 2018-06-15 DIAGNOSIS — R059 Cough, unspecified: Secondary | ICD-10-CM

## 2018-06-15 DIAGNOSIS — T7840XA Allergy, unspecified, initial encounter: Secondary | ICD-10-CM

## 2018-06-15 DIAGNOSIS — J309 Allergic rhinitis, unspecified: Secondary | ICD-10-CM | POA: Diagnosis not present

## 2018-06-15 DIAGNOSIS — I10 Essential (primary) hypertension: Secondary | ICD-10-CM | POA: Diagnosis not present

## 2018-06-15 DIAGNOSIS — R05 Cough: Secondary | ICD-10-CM | POA: Diagnosis not present

## 2018-06-15 MED ORDER — LORATADINE 10 MG PO TABS: 10.0000 mg | ORAL_TABLET | Freq: Every day | ORAL | 2 refills | Status: DC

## 2018-06-15 MED ORDER — IPRATROPIUM BROMIDE HFA 17 MCG/ACT IN AERS: INHALATION_SPRAY | RESPIRATORY_TRACT | 0 refills | Status: DC

## 2018-06-15 MED ORDER — MONTELUKAST SODIUM 10 MG PO TABS: 10.0000 mg | ORAL_TABLET | Freq: Every day | ORAL | 2 refills | Status: DC

## 2018-06-15 NOTE — Progress Notes (Signed)
Subjective:     Patient ID: Jasmine Lopez , female    DOB: 12-15-1940 , 78 y.o.   MRN: [id number]   Chief Complaint  Patient presents with  . URI    Patient states when she goes outside she has a nonproductive cough. patient states she has some post nasal drip  . Rash    patient states she has some itching on her hands and has some dry skin.    HPI 1-  She  Has hx of chronic cough that comes when wet and cold off and on. If pretty and sunny she sneezing's and coughs. If she changes environments, she starts having a lot of nose congestion. It has happened when she went to a restaurant, anther time when ridding with her son who smokes, but was not smoking in the car. Her schedule has not allowed her to go see ENT. Last time she had a chest xray > 5 + year. She does take generic Zyrtec qd. Wonders if she should switch.  She denies fever, chills or night sweats. Cough is non productive. She does get coughing fits at times. Today she has not coughed. In the past when she did this, her past provider placed her on 2 different antibiotics which did not work, and finally the [employer] worked. So she thought she needs to get a Zpack today.  Past Medical History:  Diagnosis Date  . Diabetes mellitus without complication ([place])   . Hypertension   . Irregular heart beat      Family History  Problem Relation Age of Onset  . Heart failure Mother   . COPD Mother   . Cancer Father   . Rectal cancer Maternal Grandmother   . Heart attack Maternal Grandfather      Current Outpatient Medications:  .  allopurinol (ZYLOPRIM) 100 MG tablet, Take 100 mg by mouth daily., Disp: , Rfl: 3 .  amLODipine (NORVASC) 10 MG tablet, Take 1 tablet (10 mg total) by mouth daily., Disp: 30 tablet, Rfl: 1 .  aspirin 81 MG EC tablet, Take 1 tablet by mouth daily., Disp: , Rfl:  .  atorvastatin (LIPITOR) 20 MG tablet, Take by mouth., Disp: , Rfl:  .  Biotin 10000 MCG TBDP, 1 qd, Disp: , Rfl:  .  blood glucose meter kit and  supplies KIT, Dispense based on patient and insurance preference. Use up to two times daily as directed. (FOR ICD-9 250.00, 250.01)., Disp: 1 each, Rfl: 0 .  Blood Glucose Monitoring Suppl (CONTOUR BLOOD GLUCOSE SYSTEM) w/Device KIT, Check blood sugars two times daily. Please provide patient with glucose meter that is covered by her insurance., Disp: 1 each, Rfl: 0 .  buPROPion (WELLBUTRIN XL) 300 MG 24 hr tablet, TAKE ONE TABLET (300 MG DOSE) BY MOUTH DAILY., Disp: , Rfl: 3 .  cetirizine (ZYRTEC) 10 MG tablet, TAKE ONE TABLET (10 MG TOTAL) BY MOUTH DAILY., Disp: , Rfl: 9 .  chlorthalidone (HYGROTON) 25 MG tablet, Take 0.5 tablets (12.5 mg total) by mouth daily., Disp: 15 tablet, Rfl: 3 .  Cholecalciferol (VITAMIN D3) 2000 units TABS, Take by mouth., Disp: , Rfl:  .  glucose blood (ONE TOUCH ULTRA TEST) test strip, TEST FASTING BLOOD SUGAR AND AND 2 HOURS AFTER MEALS, Disp: , Rfl:  .  losartan-hydrochlorothiazide (HYZAAR) 100-25 MG tablet, Take by mouth., Disp: , Rfl:  .  metFORMIN (GLUCOPHAGE-XR) 500 MG 24 hr tablet, Take 1 tablet (500 mg total) by mouth 2 (two) times daily., Disp: 180 tablet,  Rfl: 0 .  metoprolol succinate (TOPROL-XL) 25 MG 24 hr tablet, Take 25 mg by mouth daily., Disp: , Rfl: 3 .  ONETOUCH DELICA LANCETS 62M MISC, TEST GLUCOSE DAILY FASTING AND 2 HOURS AFTER MEALS, Disp: , Rfl:    Allergies  Allergen Reactions  . Benadryl [Diphenhydramine] Itching    Benadryl cream     Review of Systems  Constitutional: Negative for chills, diaphoresis and fever.  HENT: Positive for postnasal drip, rhinorrhea and sneezing. Negative for congestion, ear discharge, ear pain and sore throat.   Eyes: Negative for discharge.  Respiratory: Positive for cough and chest tightness. Negative for shortness of breath, wheezing and stridor.   Cardiovascular: Negative for chest pain, palpitations and leg swelling.  Musculoskeletal: Negative for myalgias.  Skin: Negative for rash.   Allergic/Immunologic: Positive for environmental allergies.  Neurological: Negative for dizziness and headaches.  Hematological: Negative for adenopathy.     Today's Vitals   06/15/18 1111  BP: (!) 142/92  Pulse: 64  Temp: 97.9 F (36.6 C)  TempSrc: Oral  SpO2: 96%  Weight: 195 lb 9.6 oz (88.7 kg)  Height: '5\' 3"'  (1.6 m)  PainSc: 0-No pain   Body mass index is 34.65 kg/m.   Objective:  Physical Exam Vitals signs and nursing note reviewed.  Constitutional:      General: She is not in acute distress.    Appearance: Normal appearance. She is not toxic-appearing.  HENT:     Head: Normocephalic.     Right Ear: Tympanic membrane, ear canal and external ear normal.     Left Ear: Tympanic membrane, ear canal and external ear normal.     Nose: Rhinorrhea present. No congestion.     Mouth/Throat:     Pharynx: Oropharyngeal exudate present. No posterior oropharyngeal erythema.  Eyes:     General: No scleral icterus.       Right eye: Discharge present.        Left eye: No discharge.     Conjunctiva/sclera: Conjunctivae normal.  Neck:     Musculoskeletal: Neck supple. No neck rigidity.  Cardiovascular:     Rate and Rhythm: Normal rate and regular rhythm.     Heart sounds: No murmur.  Pulmonary:     Effort: Pulmonary effort is normal.     Breath sounds: Normal breath sounds.  Musculoskeletal: Normal range of motion.  Lymphadenopathy:     Cervical: No cervical adenopathy.  Skin:    General: Skin is warm and dry.     Coloration: Skin is not jaundiced.     Findings: No rash.  Neurological:     Mental Status: She is alert and oriented to person, place, and time.     Coordination: Coordination normal.     Gait: Gait normal.  Psychiatric:        Mood and Affect: Mood normal.        Behavior: Behavior normal.        Thought Content: Thought content normal.        Judgment: Judgment normal.         Assessment And Plan:    1. Cough- chronic with bronchospasms and provoked  with exposure to environment. I will have her switch her Zyrtec to Loratadine, and I also placed her on Singulair 0 mg qd, and Atrovent HFA to use prior to going to the new environments or cold.  - Ambulatory referral to ENT - DG Chest 2 View; Future  2. Allergic state, initial encounter- a new ENT  referral placed.  - Ambulatory referral to ENT  We will inform her when the CXR report is back.        Mollyann Halbert RODRIGUEZ-SOUTHWORTH, PA-C

## 2018-06-18 ENCOUNTER — Encounter: Payer: Self-pay | Admitting: Cardiology

## 2018-06-18 ENCOUNTER — Ambulatory Visit: Payer: Medicare Other | Admitting: Cardiology

## 2018-06-18 DIAGNOSIS — E785 Hyperlipidemia, unspecified: Secondary | ICD-10-CM

## 2018-06-18 DIAGNOSIS — I10 Essential (primary) hypertension: Secondary | ICD-10-CM | POA: Diagnosis not present

## 2018-06-18 DIAGNOSIS — E1169 Type 2 diabetes mellitus with other specified complication: Secondary | ICD-10-CM

## 2018-06-18 MED ORDER — CARVEDILOL 6.25 MG PO TABS: 6.2500 mg | ORAL_TABLET | Freq: Two times a day (BID) | ORAL | 2 refills | Status: DC

## 2018-06-18 NOTE — Assessment & Plan Note (Signed)
On oral agents 

## 2018-06-18 NOTE — Progress Notes (Addendum)
06/18/2018 Jasmine Lopez   1941-04-06  [id number]  Primary Physician Glendale Chard, MD Primary Cardiologist: Dr Lucretia Field  HPI: [name] is a pleasant 78 year old female followed by Dr. Baird Cancer and Dr. Oval Linsey with a history of hypertension, non-insulin-dependent diabetes, and dyslipidemia.  Echocardiogram in July 2018 showed normal LV function.  She has been followed at our hypertensive clinic and we have had some difficulty in getting her blood pressure under good control.  Today she tells me that she is having a hard time cutting the chlorthalidone in half and does not want to take it anymore.  Her pressure at home runs in the 675 systolic.  Blood pressure by me with a large cuff was 152/72.  She denies any unusual chest pain or shortness of breath.  She has chronic bradycardia and is on low-dose Toprol.   Current Outpatient Medications  Medication Sig Dispense Refill  . allopurinol (ZYLOPRIM) 100 MG tablet Take 100 mg by mouth daily.  3  . amLODipine (NORVASC) 10 MG tablet Take 1 tablet (10 mg total) by mouth daily. 30 tablet 1  . Biotin 10000 MCG TBDP 1 qd    . blood glucose meter kit and supplies KIT Dispense based on patient and insurance preference. Use up to two times daily as directed. (FOR ICD-9 250.00, 250.01). 1 each 0  . buPROPion (WELLBUTRIN XL) 300 MG 24 hr tablet TAKE ONE TABLET (300 MG DOSE) BY MOUTH DAILY.  3  . Cholecalciferol (VITAMIN D3) 2000 units TABS Take by mouth.    Marland Kitchen glucose blood (ONE TOUCH ULTRA TEST) test strip TEST FASTING BLOOD SUGAR AND AND 2 HOURS AFTER MEALS    . ipratropium (ATROVENT HFA) 17 MCG/ACT inhaler 2 puffs before going out in the cold or different enviroment that causes coughs. 1 Inhaler 0  . loratadine (CLARITIN) 10 MG tablet Take 1 tablet (10 mg total) by mouth daily. 30 tablet 2  . losartan-hydrochlorothiazide (HYZAAR) 100-25 MG tablet Take by mouth.    . metFORMIN (GLUCOPHAGE-XR) 500 MG 24 hr tablet Take 1 tablet (500 mg total) by mouth 2  (two) times daily. 180 tablet 0  . montelukast (SINGULAIR) 10 MG tablet Take 1 tablet (10 mg total) by mouth daily. 30 tablet 2  . ONETOUCH DELICA LANCETS 91M MISC TEST GLUCOSE DAILY FASTING AND 2 HOURS AFTER MEALS    . carvedilol (COREG) 6.25 MG tablet Take 1 tablet (6.25 mg total) by mouth 2 (two) times daily. 60 tablet 2   No current facility-administered medications for this visit.     Allergies  Allergen Reactions  . Ace Inhibitors Cough  . Benadryl [Diphenhydramine] Itching    Benadryl cream    Past Medical History:  Diagnosis Date  . Diabetes mellitus without complication ([place])   . Hypertension   . Irregular heart beat     Social History   Socioeconomic History  . Marital status: Widowed    Spouse name: Not on file  . Number of children: Not on file  . Years of education: Not on file  . Highest education level: Not on file  Occupational History  . Not on file  Social Needs  . Financial resource strain: Not on file  . Food insecurity:    Worry: Not on file    Inability: Not on file  . Transportation needs:    Medical: Not on file    Non-medical: Not on file  Tobacco Use  . Smoking status: Never Smoker  . Smokeless tobacco: Never Used  Substance and Sexual Activity  . Alcohol use: Never    Frequency: Never  . Drug use: Never  . Sexual activity: Not on file  Lifestyle  . Physical activity:    Days per week: Not on file    Minutes per session: Not on file  . Stress: Not on file  Relationships  . Social connections:    Talks on phone: Not on file    Gets together: Not on file    Attends religious service: Not on file    Active member of club or organization: Not on file    Attends meetings of clubs or organizations: Not on file    Relationship status: Not on file  . Intimate partner violence:    Fear of current or ex partner: Not on file    Emotionally abused: Not on file    Physically abused: Not on file    Forced sexual activity: Not on file    Other Topics Concern  . Not on file  Social History Narrative  . Not on file     Family History  Problem Relation Age of Onset  . Heart failure Mother   . COPD Mother   . Cancer Father   . Rectal cancer Maternal Grandmother   . Heart attack Maternal Grandfather      Review of Systems: General: negative for chills, fever, night sweats or weight changes.  Cardiovascular: negative for chest pain, dyspnea on exertion, edema, orthopnea, palpitations, paroxysmal nocturnal dyspnea or shortness of breath Dermatological: negative for rash Respiratory: negative for wheezing Urologic: negative for hematuria Abdominal: negative for nausea, vomiting, diarrhea, bright red blood per rectum, melena, or hematemesis Neurologic: negative for visual changes, syncope, or dizziness All other systems reviewed and are otherwise negative except as noted above.    Blood pressure (!) 157/77, pulse 87, height '5\' 3"'  (1.6 m), weight 196 lb 6.4 oz (89.1 kg), SpO2 97 %.  General appearance: alert, cooperative, no distress and mildly obese Lungs: clear to auscultation bilaterally Heart: regular rate and rhythm with 2/6 systolic murmur AOV and LSB Extremities: no edema Skin: Skin color, texture, turgor normal. No rashes or lesions Neurologic: Grossly normal   ASSESSMENT AND PLAN:   Essential hypertension D/c benazapril - cough 06/20/11 She is having a hard time cutting chlorthalidone -will stop this Try Coreg 6.25 mg BID instead of Toprol- may have more effect of B/P but we probably won't be able to titrate secondary to bradycardia.   Type 2 diabetes mellitus with hyperlipidemia (HCC) On oral agents   PLAN  Coreg 6.25 mg BID- stop Toprol, stop chlorthalidone, stop ASA.  F/U in a few weeks, if her B/P remains high consider adding clonidine.   Kerin Ransom PA-C 06/18/2018 3:57 PM

## 2018-06-18 NOTE — Patient Instructions (Addendum)
Medication Instructions:  START Coreg 6.25mg  take 1 tablet twice a day   STOP Metoprolol STOP Aspirin  STOP Chlorthalidone If you need a refill on your cardiac medications before your next appointment, please call your pharmacy.   Lab work: None  If you have labs (blood work) drawn today and your tests are completely normal, you will receive your results only by: Marland Kitchen MyChart Message (if you have MyChart) OR . A paper copy in the mail If you have any lab test that is abnormal or we need to change your treatment, we will call you to review the results.  Testing/Procedures: None   Follow-Up: At [facility], you and your health needs are our priority.  As part of our continuing mission to provide you with exceptional heart care, we have created designated Provider Care Teams.  These Care Teams include your primary Cardiologist (physician) and Advanced Practice Providers (APPs -  Physician Assistants and Nurse Practitioners) who all work together to provide you with the care you need, when you need it. . Your physician recommends that you schedule a follow-up appointment in: 3-4 weeks with Kerin Ransom, PA-C  Any Other Special Instructions Will Be Listed Below (If Applicable).

## 2018-06-18 NOTE — Assessment & Plan Note (Signed)
D/c benazapril - cough 06/20/11 She is having a hard time cutting chlorthalidone -will stop this Try Coreg 6.25 mg BID instead of Toprol- may have more effect of B/P but we probably won't be able to titrate secondary to bradycardia.

## 2018-06-24 ENCOUNTER — Other Ambulatory Visit: Payer: Self-pay | Admitting: Cardiology

## 2018-06-24 MED ORDER — LOSARTAN POTASSIUM-HCTZ 100-25 MG PO TABS: 1.0000 | ORAL_TABLET | Freq: Every day | ORAL | 5 refills | Status: DC

## 2018-06-24 NOTE — Telephone Encounter (Signed)
New Message     *STAT* If patient is at the pharmacy, call can be transferred to refill team.   1. Which medications need to be refilled? (please list name of each medication and dose if known) losartan  2. Which pharmacy/location (including street and city if local pharmacy) is medication to be sent to? [facility] [address]   3. Do they need a 30 day or 90 day supply? 30   Patient states that [facility] does not have any losartan in stock needs rx sent to [facility] but it should just be losartan only.

## 2018-06-24 NOTE — Telephone Encounter (Signed)
Rx(s) sent to pharmacy electronically.  

## 2018-06-28 ENCOUNTER — Encounter: Payer: Self-pay | Admitting: Nurse Practitioner

## 2018-06-28 ENCOUNTER — Ambulatory Visit: Payer: Medicare Other | Admitting: Nurse Practitioner

## 2018-06-28 VITALS — BP 150/82 | HR 65 | Temp 97.7°F | Ht 63.8 in | Wt 198.8 lb

## 2018-06-28 DIAGNOSIS — I10 Essential (primary) hypertension: Secondary | ICD-10-CM

## 2018-06-28 DIAGNOSIS — R059 Cough, unspecified: Secondary | ICD-10-CM

## 2018-06-28 DIAGNOSIS — J209 Acute bronchitis, unspecified: Secondary | ICD-10-CM | POA: Diagnosis not present

## 2018-06-28 DIAGNOSIS — R05 Cough: Secondary | ICD-10-CM

## 2018-06-28 MED ORDER — PREDNISONE 10 MG (21) PO TBPK: ORAL_TABLET | ORAL | 0 refills | Status: DC

## 2018-06-28 MED ORDER — MY MDI PORTABLE NEBULISER MISC: 1.0000 | 0 refills | Status: DC

## 2018-06-28 NOTE — Progress Notes (Signed)
Subjective:     Patient ID: Jasmine Lopez , female    DOB: July 01, 1940 , 78 y.o.   MRN: [id number]   Chief Complaint  Patient presents with  . URI    patient states she has some chest congestion and a productive cough. patient states when she uses her inhaler it causes her not to be able to talk as much. patient denies having any fever patient states she is currently using her inhaler and mucinex.    HPI  Blood sugar was 108 this morning.   URI   This is a new problem. The current episode started in the past 7 days. The problem has been gradually worsening. There has been no fever. Associated symptoms include coughing. Pertinent negatives include no chest pain or wheezing. She has tried nothing for the symptoms. The treatment provided no relief.     Past Medical History:  Diagnosis Date  . Diabetes mellitus without complication ([place])   . Hypertension   . Irregular heart beat      Family History  Problem Relation Age of Onset  . Heart failure Mother   . COPD Mother   . Cancer Father   . Rectal cancer Maternal Grandmother   . Heart attack Maternal Grandfather      Current Outpatient Medications:  .  allopurinol (ZYLOPRIM) 100 MG tablet, Take 100 mg by mouth daily., Disp: , Rfl: 3 .  amLODipine (NORVASC) 10 MG tablet, Take 1 tablet (10 mg total) by mouth daily., Disp: 30 tablet, Rfl: 1 .  Biotin 10000 MCG TBDP, 1 qd, Disp: , Rfl:  .  blood glucose meter kit and supplies KIT, Dispense based on patient and insurance preference. Use up to two times daily as directed. (FOR ICD-9 250.00, 250.01)., Disp: 1 each, Rfl: 0 .  buPROPion (WELLBUTRIN XL) 300 MG 24 hr tablet, TAKE ONE TABLET (300 MG DOSE) BY MOUTH DAILY., Disp: , Rfl: 3 .  carvedilol (COREG) 6.25 MG tablet, Take 1 tablet (6.25 mg total) by mouth 2 (two) times daily., Disp: 60 tablet, Rfl: 2 .  Cholecalciferol (VITAMIN D3) 2000 units TABS, Take by mouth., Disp: , Rfl:  .  glucose blood (ONE TOUCH ULTRA TEST) test strip,  TEST FASTING BLOOD SUGAR AND AND 2 HOURS AFTER MEALS, Disp: , Rfl:  .  ipratropium (ATROVENT HFA) 17 MCG/ACT inhaler, 2 puffs before going out in the cold or different enviroment that causes coughs., Disp: 1 Inhaler, Rfl: 0 .  loratadine (CLARITIN) 10 MG tablet, Take 1 tablet (10 mg total) by mouth daily., Disp: 30 tablet, Rfl: 2 .  losartan-hydrochlorothiazide (HYZAAR) 100-25 MG tablet, Take 1 tablet by mouth daily., Disp: 30 tablet, Rfl: 5 .  metFORMIN (GLUCOPHAGE-XR) 500 MG 24 hr tablet, Take 1 tablet (500 mg total) by mouth 2 (two) times daily., Disp: 180 tablet, Rfl: 0 .  montelukast (SINGULAIR) 10 MG tablet, Take 1 tablet (10 mg total) by mouth daily., Disp: 30 tablet, Rfl: 2 .  ONETOUCH DELICA LANCETS 30Q MISC, TEST GLUCOSE DAILY FASTING AND 2 HOURS AFTER MEALS, Disp: , Rfl:    Allergies  Allergen Reactions  . Ace Inhibitors Cough  . Benadryl [Diphenhydramine] Itching    Benadryl cream     Review of Systems  Constitutional: Negative.   Eyes: Negative for photophobia.  Respiratory: Positive for cough. Negative for chest tightness and wheezing.   Cardiovascular: Negative.  Negative for chest pain, palpitations and leg swelling.     Today's Vitals   06/28/18 1143  BP: Marland Kitchen)  150/82  Pulse: 65  Temp: 97.7 F (36.5 C)  TempSrc: Oral  SpO2: 97%  Weight: 198 lb 12.8 oz (90.2 kg)  Height: 5' 3.8" (1.621 m)  PainSc: 0-No pain   Body mass index is 34.34 kg/m.   Objective:  Physical Exam Constitutional:      Appearance: Normal appearance. She is not ill-appearing.  Cardiovascular:     Rate and Rhythm: Normal rate and regular rhythm.     Pulses: Normal pulses.     Heart sounds: Normal heart sounds. No murmur.  Pulmonary:     Effort: Pulmonary effort is normal.     Breath sounds: Examination of the right-upper field reveals wheezing. Examination of the left-upper field reveals wheezing. Examination of the right-lower field reveals wheezing. Examination of the left-lower field  reveals wheezing. Wheezing present.     Comments: Dry cough noted Neurological:     General: No focal deficit present.     Mental Status: She is alert and oriented to person, place, and time.  Psychiatric:        Mood and Affect: Mood normal.        Behavior: Behavior normal.        Thought Content: Thought content normal.        Judgment: Judgment normal.         Assessment And Plan:     1. Cough  Dry cough noted and mild wheezing noted bilaterally  Will send an MDI to see if helps with a better inhale  Will also treat with prednisone taper advised to monitor blood sugar - Nebulizers (MY MDI PORTABLE NEBULISER) MISC; 1 each by Does not apply route as directed.  Dispense: 1 each; Refill: 0 - predniSONE (STERAPRED UNI-[name] 21 TAB) 10 MG (21) TBPK tablet; Take as directed  Dispense: 21 tablet; Refill: 0  2. Acute bronchitis, unspecified organism  See #1 - predniSONE (STERAPRED UNI-[name] 21 TAB) 10 MG (21) TBPK tablet; Take as directed  Dispense: 21 tablet; Refill: 0       [name], FNP

## 2018-06-29 ENCOUNTER — Ambulatory Visit: Payer: Medicare Other | Admitting: Internal Medicine

## 2018-06-30 ENCOUNTER — Telehealth: Payer: Self-pay | Admitting: Cardiology

## 2018-06-30 MED ORDER — LOSARTAN POTASSIUM 100 MG PO TABS: 100.0000 mg | ORAL_TABLET | Freq: Every day | ORAL | 3 refills | Status: DC

## 2018-06-30 MED ORDER — HYDROCHLOROTHIAZIDE 25 MG PO TABS: 25.0000 mg | ORAL_TABLET | Freq: Every day | ORAL | 3 refills | Status: DC

## 2018-06-30 NOTE — Telephone Encounter (Signed)
Try splitting it in 2 prescriptions.   Losartan 100mg  daily  HCTZ 25mg  daily

## 2018-06-30 NOTE — Telephone Encounter (Signed)
New Message   Pt c/o medication issue:  1. Name of Medication: Losartan -hydrochlorothiazide 100-25mg   2. How are you currently taking this medication (dosage and times per day)? Currently not taking  3. Are you having a reaction (difficulty breathing--STAT)? No  4. What is your medication issue? PT says no drug store has the Losartan and she has not taken it at all because she has never been able to get it filled. She has been taking Zalsartan.160mg   PT states Kerin Ransom wanted to change her from the Zalsartan to Losartan but she cant get it filled   Please call

## 2018-06-30 NOTE — Telephone Encounter (Signed)
Please advise, patient has seen pharmacy in the past for HTN, she is having issues getting her Losartan/HCTZ filled from her pharmacy, they are telling her it is on backorder every time she tries to fill it.  Any recommendations on another type of medication she could use while this is on backorder? Thank you!

## 2018-06-30 NOTE — Telephone Encounter (Signed)
Called patient, she agreed to attempt to have the 2 separate RX's.  Patient medication sent to pharmacy.  Med list updated.

## 2018-07-06 ENCOUNTER — Encounter: Payer: Self-pay | Admitting: Nurse Practitioner

## 2018-07-09 ENCOUNTER — Other Ambulatory Visit: Payer: Self-pay | Admitting: Internal Medicine

## 2018-07-11 ENCOUNTER — Other Ambulatory Visit: Payer: Self-pay | Admitting: Internal Medicine

## 2018-07-14 ENCOUNTER — Other Ambulatory Visit: Payer: Self-pay

## 2018-07-14 ENCOUNTER — Ambulatory Visit: Payer: Medicare Other | Admitting: Cardiology

## 2018-07-14 ENCOUNTER — Encounter: Payer: Self-pay | Admitting: Cardiology

## 2018-07-14 DIAGNOSIS — R011 Cardiac murmur, unspecified: Secondary | ICD-10-CM

## 2018-07-14 DIAGNOSIS — I5189 Other ill-defined heart diseases: Secondary | ICD-10-CM | POA: Insufficient documentation

## 2018-07-14 DIAGNOSIS — I10 Essential (primary) hypertension: Secondary | ICD-10-CM

## 2018-07-14 DIAGNOSIS — E119 Type 2 diabetes mellitus without complications: Secondary | ICD-10-CM

## 2018-07-14 DIAGNOSIS — K429 Umbilical hernia without obstruction or gangrene: Secondary | ICD-10-CM | POA: Insufficient documentation

## 2018-07-14 NOTE — Assessment & Plan Note (Signed)
On oral agents 

## 2018-07-14 NOTE — Assessment & Plan Note (Signed)
>>  ASSESSMENT AND PLAN FOR NON-INSULIN DEPENDENT TYPE 2 DIABETES MELLITUS (HCC) WRITTEN ON 07/14/2018  4:17 PM BY KILROY, LUKE K, PA-C  On oral agents

## 2018-07-14 NOTE — Assessment & Plan Note (Signed)
Benign outflow tract murmur secondary to hyperdynamic LVF-EF 70-75%

## 2018-07-14 NOTE — Patient Instructions (Signed)
Medication Instructions:  Your physician recommends that you continue on your current medications as directed. Please refer to the Current Medication list given to you today. If you need a refill on your cardiac medications before your next appointment, please call your pharmacy.   Lab work: None  If you have labs (blood work) drawn today and your tests are completely normal, you will receive your results only by: . MyChart Message (if you have MyChart) OR . A paper copy in the mail If you have any lab test that is abnormal or we need to change your treatment, we will call you to review the results.  Testing/Procedures: None   Follow-Up: At [facility], you and your health needs are our priority.  As part of our continuing mission to provide you with exceptional heart care, we have created designated Provider Care Teams.  These Care Teams include your primary Cardiologist (physician) and Advanced Practice Providers (APPs -  Physician Assistants and Nurse Practitioners) who all work together to provide you with the care you need, when you need it. You will need a follow up appointment in 6 months.  Please call our office 2 months in advance to schedule this appointment.  You may see Tiffany Silverton, MD or one of the following Advanced Practice Providers on your designated Care Team:   Luke Kilroy, PA-C Krista Kroeger, PA-C . Callie Goodrich, PA-C  Any Other Special Instructions Will Be Listed Below (If Applicable).    

## 2018-07-14 NOTE — Assessment & Plan Note (Signed)
Better control on her current regimen.

## 2018-07-14 NOTE — Progress Notes (Signed)
07/14/2018 Jasmine Lopez   1940/07/07  [id number]  Primary Physician Glendale Chard, MD Primary Cardiologist: Dr Oval Linsey  HPI: Patient is a pleasant 78 year old female with a history of hypertension, non-insulin-dependent diabetes, and dyslipidemia.  Prior echocardiogram in July 2018 showed preserved LV function.  We seen her off and on for hypertension.  She developed a cough with an ACE inhibitor and was changed to an ARB.  She has some baseline bradycardia and was not able to tolerate higher doses of Toprol in the past.  I saw her in the office 06/18/2018.  Her blood pressure was not well controlled.  She was having a hard time with chlorthalidone, it was too small to cut.  I suggested we stop her Toprol, add carvedilol 6.25 twice daily, stop her chlorthalidone and stop her aspirin.  She is back in the office today in follow-up.  Since I saw her last she has been doing well.  She does have an upper respiratory type issue, she even saw an ENT doctor, eventually she was told it was nothing serious and that she needs to "rest her vocal cords".   Current Outpatient Medications  Medication Sig Dispense Refill  . allopurinol (ZYLOPRIM) 100 MG tablet Take 100 mg by mouth daily.  3  . amLODipine (NORVASC) 10 MG tablet Take 1 tablet (10 mg total) by mouth daily. 30 tablet 1  . Biotin 10000 MCG TBDP 1 qd    . blood glucose meter kit and supplies KIT Dispense based on patient and insurance preference. Use up to two times daily as directed. (FOR ICD-9 250.00, 250.01). 1 each 0  . buPROPion (WELLBUTRIN XL) 300 MG 24 hr tablet TAKE ONE TABLET (300 MG DOSE) BY MOUTH DAILY.  3  . carvedilol (COREG) 6.25 MG tablet Take 1 tablet (6.25 mg total) by mouth 2 (two) times daily. 60 tablet 2  . Cholecalciferol (VITAMIN D3) 2000 units TABS Take by mouth.    Marland Kitchen glucose blood (ONE TOUCH ULTRA TEST) test strip TEST FASTING BLOOD SUGAR AND AND 2 HOURS AFTER MEALS    . hydrochlorothiazide (HYDRODIURIL) 25 MG tablet Take  1 tablet (25 mg total) by mouth daily. 90 tablet 3  . ipratropium (ATROVENT HFA) 17 MCG/ACT inhaler INHALE 2 PUFFS BEFORE GOING OUT IN THE COLD OR DIFFERENT ENVIROMENT THAT CAUSES COUGHS. 12.9 Inhaler 0  . loratadine (CLARITIN) 10 MG tablet Take 1 tablet (10 mg total) by mouth daily. 30 tablet 2  . losartan (COZAAR) 100 MG tablet Take 1 tablet (100 mg total) by mouth daily. 90 tablet 3  . metFORMIN (GLUCOPHAGE-XR) 500 MG 24 hr tablet TAKE 1 TABLET BY MOUTH DAILY WITH EVENING MEAL 90 tablet 0  . montelukast (SINGULAIR) 10 MG tablet Take 1 tablet (10 mg total) by mouth daily. 30 tablet 2  . ONETOUCH DELICA LANCETS 41Y MISC TEST GLUCOSE DAILY FASTING AND 2 HOURS AFTER MEALS     No current facility-administered medications for this visit.     Allergies  Allergen Reactions  . Ace Inhibitors Cough  . Benadryl [Diphenhydramine] Itching    Benadryl cream    Past Medical History:  Diagnosis Date  . Diabetes mellitus without complication ([place])   . Hypertension   . Irregular heart beat     Social History   Socioeconomic History  . Marital status: Widowed    Spouse name: Not on file  . Number of children: Not on file  . Years of education: Not on file  . Highest education level:  Not on file  Occupational History  . Not on file  Social Needs  . Financial resource strain: Not on file  . Food insecurity:    Worry: Not on file    Inability: Not on file  . Transportation needs:    Medical: Not on file    Non-medical: Not on file  Tobacco Use  . Smoking status: Never Smoker  . Smokeless tobacco: Never Used  Substance and Sexual Activity  . Alcohol use: Never    Frequency: Never  . Drug use: Never  . Sexual activity: Not on file  Lifestyle  . Physical activity:    Days per week: Not on file    Minutes per session: Not on file  . Stress: Not on file  Relationships  . Social connections:    Talks on phone: Not on file    Gets together: Not on file    Attends religious service:  Not on file    Active member of club or organization: Not on file    Attends meetings of clubs or organizations: Not on file    Relationship status: Not on file  . Intimate partner violence:    Fear of current or ex partner: Not on file    Emotionally abused: Not on file    Physically abused: Not on file    Forced sexual activity: Not on file  Other Topics Concern  . Not on file  Social History Narrative  . Not on file     Family History  Problem Relation Age of Onset  . Heart failure Mother   . COPD Mother   . Cancer Father   . Rectal cancer Maternal Grandmother   . Heart attack Maternal Grandfather      Review of Systems: General: negative for chills, fever, night sweats or weight changes.  Cardiovascular: negative for chest pain, dyspnea on exertion, edema, orthopnea, palpitations, paroxysmal nocturnal dyspnea or shortness of breath Dermatological: negative for rash Respiratory: negative for cough or wheezing Urologic: negative for hematuria Abdominal: negative for nausea, vomiting, diarrhea, bright red blood per rectum, melena, or hematemesis Neurologic: negative for visual changes, syncope, or dizziness All other systems reviewed and are otherwise negative except as noted above.    Blood pressure 137/70, pulse 68, height _0  (1.6 m), weight 195 lb (88.5 kg).  General appearance: alert, cooperative, no distress and mildly obese Neck: no JVD and thyroid not enlarged, symmetric, no tenderness/mass/nodules Lungs: clear to auscultation bilaterally Heart: regular rate and rhythm and systolic ejection murmur Extremities: extremities normal, atraumatic, no cyanosis or edema and no edema Skin: Skin color, texture, turgor normal. No rashes or lesions Neurologic: Grossly normal   ASSESSMENT AND PLAN:   Essential hypertension Better control on her current regimen.   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  Kerin Ransom PA-C 07/14/2018 4:17 PM

## 2018-07-15 DIAGNOSIS — R490 Dysphonia: Secondary | ICD-10-CM | POA: Insufficient documentation

## 2018-07-15 DIAGNOSIS — T7840XA Allergy, unspecified, initial encounter: Secondary | ICD-10-CM | POA: Insufficient documentation

## 2018-07-15 HISTORY — DX: Dysphonia: R49.0

## 2018-07-22 ENCOUNTER — Ambulatory Visit: Payer: Medicare Other

## 2018-07-22 ENCOUNTER — Ambulatory Visit: Payer: Medicare Other | Admitting: Internal Medicine

## 2018-07-29 ENCOUNTER — Ambulatory Visit: Payer: Medicare Other | Admitting: Internal Medicine

## 2018-07-29 ENCOUNTER — Ambulatory Visit: Payer: Medicare Other

## 2018-08-01 ENCOUNTER — Other Ambulatory Visit: Payer: Self-pay | Admitting: Internal Medicine

## 2018-08-10 ENCOUNTER — Other Ambulatory Visit: Payer: Self-pay | Admitting: Internal Medicine

## 2018-08-10 ENCOUNTER — Telehealth: Payer: Self-pay | Admitting: Internal Medicine

## 2018-08-10 NOTE — Telephone Encounter (Signed)
I called the patient about scheduling virtual AWV via video.  She said that she will try to get her son to help her and then call back. VDM (DD)

## 2018-09-06 ENCOUNTER — Other Ambulatory Visit: Payer: Self-pay | Admitting: Internal Medicine

## 2018-09-13 ENCOUNTER — Other Ambulatory Visit: Payer: Self-pay | Admitting: Cardiology

## 2018-09-15 ENCOUNTER — Telehealth: Payer: Self-pay | Admitting: Internal Medicine

## 2018-09-15 NOTE — Telephone Encounter (Signed)
I called the patient and left a message asking her to call me to schedule virtual AWV if interested. VDM (DD)

## 2018-09-17 ENCOUNTER — Other Ambulatory Visit: Payer: Self-pay | Admitting: Internal Medicine

## 2018-09-17 ENCOUNTER — Other Ambulatory Visit: Payer: Self-pay | Admitting: Cardiovascular Disease

## 2018-09-17 NOTE — Telephone Encounter (Deleted)
Amlodipine 5 mg refilled. 

## 2018-10-02 ENCOUNTER — Other Ambulatory Visit: Payer: Self-pay | Admitting: Internal Medicine

## 2018-10-14 ENCOUNTER — Ambulatory Visit (INDEPENDENT_AMBULATORY_CARE_PROVIDER_SITE_OTHER): Payer: Medicare Other

## 2018-10-14 ENCOUNTER — Other Ambulatory Visit: Payer: Self-pay

## 2018-10-14 ENCOUNTER — Ambulatory Visit: Payer: Medicare Other | Admitting: Internal Medicine

## 2018-10-14 ENCOUNTER — Ambulatory Visit
Admission: RE | Admit: 2018-10-14 | Discharge: 2018-10-14 | Disposition: A | Discharge status: Home / Self Care | Payer: Medicare Other | Source: Ambulatory Visit | Attending: Internal Medicine | Admitting: Internal Medicine

## 2018-10-14 ENCOUNTER — Other Ambulatory Visit: Payer: Self-pay | Admitting: Internal Medicine

## 2018-10-14 ENCOUNTER — Encounter: Payer: Self-pay | Admitting: Internal Medicine

## 2018-10-14 VITALS — BP 148/78 | HR 66 | Temp 98.2°F | Ht 62.8 in | Wt 192.0 lb

## 2018-10-14 DIAGNOSIS — E1169 Type 2 diabetes mellitus with other specified complication: Secondary | ICD-10-CM

## 2018-10-14 DIAGNOSIS — M25512 Pain in left shoulder: Secondary | ICD-10-CM

## 2018-10-14 DIAGNOSIS — E785 Hyperlipidemia, unspecified: Secondary | ICD-10-CM | POA: Diagnosis not present

## 2018-10-14 DIAGNOSIS — R682 Dry mouth, unspecified: Secondary | ICD-10-CM | POA: Insufficient documentation

## 2018-10-14 DIAGNOSIS — Z0000 Encounter for general adult medical examination without abnormal findings: Secondary | ICD-10-CM

## 2018-10-14 DIAGNOSIS — R05 Cough: Secondary | ICD-10-CM | POA: Diagnosis not present

## 2018-10-14 DIAGNOSIS — M898X1 Other specified disorders of bone, shoulder: Secondary | ICD-10-CM

## 2018-10-14 DIAGNOSIS — E1165 Type 2 diabetes mellitus with hyperglycemia: Secondary | ICD-10-CM

## 2018-10-14 DIAGNOSIS — R8290 Unspecified abnormal findings in urine: Secondary | ICD-10-CM

## 2018-10-14 DIAGNOSIS — E782 Mixed hyperlipidemia: Secondary | ICD-10-CM

## 2018-10-14 DIAGNOSIS — M65331 Trigger finger, right middle finger: Secondary | ICD-10-CM

## 2018-10-14 DIAGNOSIS — R059 Cough, unspecified: Secondary | ICD-10-CM

## 2018-10-14 DIAGNOSIS — R0781 Pleurodynia: Secondary | ICD-10-CM

## 2018-10-14 DIAGNOSIS — R413 Other amnesia: Secondary | ICD-10-CM

## 2018-10-14 DIAGNOSIS — I10 Essential (primary) hypertension: Secondary | ICD-10-CM

## 2018-10-14 LAB — POCT URINALYSIS DIPSTICK
Bilirubin, UA: NEGATIVE
Blood, UA: NEGATIVE
Glucose, UA: NEGATIVE
Ketones, UA: NEGATIVE
Nitrite, UA: NEGATIVE
Protein, UA: NEGATIVE
Spec Grav, UA: 1.025 (ref 1.010–1.025)
Urobilinogen, UA: 0.2 E.U./dL
pH, UA: 6 (ref 5.0–8.0)

## 2018-10-14 LAB — POCT UA - MICROALBUMIN
Albumin/Creatinine Ratio, Urine, POC: <30
Creatinine, POC: 300 mg/dL
Microalbumin Ur, POC: 10 mg/L

## 2018-10-14 NOTE — Patient Instructions (Signed)

## 2018-10-14 NOTE — Addendum Note (Signed)
Addended by: Kellie Simmering on: 10/14/2018 01:42 PM   Modules accepted: Orders

## 2018-10-14 NOTE — Progress Notes (Signed)
Subjective:   Jasmine Lopez is a 78 y.o. female who presents for Medicare Annual (Subsequent) preventive examination.  Review of Systems:  n/a Cardiac Risk Factors include: advanced age (>60mn, >>64women);hypertension;diabetes mellitus;obesity (BMI >30kg/m2)     Objective:     Vitals: BP (!) 148/78 (BP Location: Left Arm, Patient Position: Sitting, Cuff Size: Normal)   Pulse 66   Temp 98.2 F (36.8 C) (Oral)   Ht 5' 2.8" (1.595 m)   Wt 192 lb (87.1 kg)   SpO2 99%   BMI 34.23 kg/m   Body mass index is 34.23 kg/m.  Advanced Directives 10/14/2018  Does Patient Have a Medical Advance Directive? Yes  Type of Advance Directive [place]? No - copy requested    Tobacco Social History   Tobacco Use  Smoking Status Never Smoker  Smokeless Tobacco Never Used     Counseling given: Not Answered   Clinical Intake:  Pre-visit preparation completed: Yes  Pain : No/denies pain Pain Score: 0-No pain     Nutritional Status: BMI > 30  Obese Nutritional Risks: None Diabetes: Yes CBG done?: No Did pt. bring in CBG monitor from home?: No  How often do you need to have someone help you when you read instructions, pamphlets, or other written materials from your doctor or pharmacy?: 1 - Never What is the last grade level you completed in school?: Master's Degree  Interpreter Needed?: No  Information entered by :: NAllen LPN  Past Medical History:  Diagnosis Date  . Diabetes mellitus without complication ([place]   . Hypertension   . Irregular heart beat    History reviewed. No pertinent surgical history. Family History  Problem Relation Age of Onset  . Heart failure Mother   . COPD Mother   . Cancer Father   . Rectal cancer Maternal Grandmother   . Heart attack Maternal Grandfather    Social History   Socioeconomic History  . Marital status: Widowed    Spouse name: Not on file  . Number of  children: Not on file  . Years of education: Not on file  . Highest education level: Not on file  Occupational History  . Occupation: retired  SScientific laboratory technician . Financial resource strain: Not hard at all  . Food insecurity:    Worry: Never true    Inability: Never true  . Transportation needs:    Medical: No    Non-medical: No  Tobacco Use  . Smoking status: Never Smoker  . Smokeless tobacco: Never Used  Substance and Sexual Activity  . Alcohol use: Never    Frequency: Never  . Drug use: Never  . Sexual activity: Not Currently  Lifestyle  . Physical activity:    Days per week: 2 days    Minutes per session: 30 min  . Stress: Not at all  Relationships  . Social connections:    Talks on phone: Not on file    Gets together: Not on file    Attends religious service: Not on file    Active member of club or organization: Not on file    Attends meetings of clubs or organizations: Not on file    Relationship status: Not on file  Other Topics Concern  . Not on file  Social History Narrative  . Not on file    Outpatient Encounter Medications as of 10/14/2018  Medication Sig  . allopurinol (ZYLOPRIM) 100 MG tablet TAKE  1 TABLET BY MOUTH EVERY DAY  . amLODipine (NORVASC) 10 MG tablet Take 1 tablet (10 mg total) by mouth daily.  . Biotin 10000 MCG TBDP 1 qd  . blood glucose meter kit and supplies KIT Dispense based on patient and insurance preference. Use up to two times daily as directed. (FOR ICD-9 250.00, 250.01).  Marland Kitchen buPROPion (WELLBUTRIN XL) 300 MG 24 hr tablet TAKE ONE TABLET (300 MG DOSE) BY MOUTH DAILY.  . carvedilol (COREG) 6.25 MG tablet TAKE 1 TABLET BY MOUTH TWICE A DAY  . chlorthalidone (HYGROTON) 25 MG tablet   . Cholecalciferol (VITAMIN D3) 2000 units TABS Take by mouth.  Marland Kitchen glucose blood (ONE TOUCH ULTRA TEST) test strip TEST FASTING BLOOD SUGAR AND AND 2 HOURS AFTER MEALS  . loratadine (CLARITIN) 10 MG tablet TAKE 1 TABLET BY MOUTH EVERY DAY  . metFORMIN  (GLUCOPHAGE-XR) 500 MG 24 hr tablet TAKE 1 TABLET BY MOUTH DAILY WITH EVENING MEAL  . ONETOUCH DELICA LANCETS 96V MISC TEST GLUCOSE DAILY FASTING AND 2 HOURS AFTER MEALS  . hydrochlorothiazide (HYDRODIURIL) 25 MG tablet Take 1 tablet (25 mg total) by mouth daily.  Marland Kitchen ipratropium (ATROVENT HFA) 17 MCG/ACT inhaler INHALE 2 PUFFS BEFORE GOING OUT IN THE COLD OR DIFFERENT ENVIROMENT THAT CAUSES COUGHS. (Patient not taking: Reported on 10/14/2018)  . losartan (COZAAR) 100 MG tablet Take 1 tablet (100 mg total) by mouth daily.  . montelukast (SINGULAIR) 10 MG tablet TAKE 1 TABLET BY MOUTH EVERY DAY (Patient not taking: Reported on 10/14/2018)   No facility-administered encounter medications on file as of 10/14/2018.     Activities of Daily Living In your present state of health, do you have any difficulty performing the following activities: 10/14/2018  Hearing? N  Vision? N  Difficulty concentrating or making decisions? N  Walking or climbing stairs? N  Dressing or bathing? N  Doing errands, shopping? N  Preparing Food and eating ? N  Using the Toilet? N  In the past six months, have you accidently leaked urine? N  Do you have problems with loss of bowel control? N  Managing your Medications? N  Managing your Finances? N  Housekeeping or managing your Housekeeping? N    Patient Care Team: Glendale Chard, MD as PCP - General (Internal Medicine) Skeet Latch, MD as PCP - Cardiology (Cardiology)    Assessment:   This is a routine wellness examination for Adan.  Exercise Activities and Dietary recommendations Current Exercise Habits: Home exercise routine, Type of exercise: walking, Time (Minutes): 30, Frequency (Times/Week): 2, Weekly Exercise (Minutes/Week): 60  Goals    . Weight (lb) < 200 lb (90.7 kg)     Wants to lose 20 pounds       Fall Risk Fall Risk  10/14/2018 06/15/2018 04/22/2018  Falls in the past year? 0 0 0  Risk for fall due to : Medication side effect - -  Follow  up Falls prevention discussed;Education provided - -   Is the patient's home free of loose throw rugs in walkways, pet beds, electrical cords, etc?   yes      Grab bars in the bathroom? no      Handrails on the stairs?   yes      Adequate lighting?   yes  Timed Get Up and Go performed: n/a  Depression Screen PHQ 2/9 Scores 10/14/2018 06/15/2018 04/22/2018  PHQ - 2 Score 0 0 0  PHQ- 9 Score 3 - -     Cognitive Function  6CIT Screen 10/14/2018  What Year? 0 points  What month? 0 points  What time? 0 points  Count back from 20 0 points  Months in reverse 0 points  Repeat phrase 0 points  Total Score 0     There is no immunization history on file for this patient.  Qualifies for Shingles Vaccine? yes  Screening Tests Health Maintenance  Topic Date Due  . FOOT EXAM  08/31/1952  . OPHTHALMOLOGY EXAM  08/31/1952  . URINE MICROALBUMIN  08/31/1952  . TETANUS/TDAP  08/31/1961  . DEXA SCAN  09/01/2007  . PNA vac Low Risk Adult (1 of 2 - PCV13) 09/01/2007  . HEMOGLOBIN A1C  10/22/2018  . INFLUENZA VACCINE  12/11/2018    Cancer Screenings: Lung: Low Dose CT Chest recommended if Age 82-80 years, 30 pack-year currently smoking OR have quit w/in 15years. Patient does not qualify. Breast:  Up to date on Mammogram? Yes   Up to date of Bone Density/Dexa? Yes Colorectal: not required  Additional Screenings: : Hepatitis C Screening: n/a     Plan:    Wants to lose 20 pounds. Declines vaccinations   I have personally reviewed and noted the following in the patient's chart:   . Medical and social history . Use of alcohol, tobacco or illicit drugs  . Current medications and supplements . Functional ability and status . Nutritional status . Physical activity . Advanced directives . List of other physicians . Hospitalizations, surgeries, and ER visits in previous 12 months . Vitals . Screenings to include cognitive, depression, and falls . Referrals and appointments  In  addition, I have reviewed and discussed with patient certain preventive protocols, quality metrics, and best practice recommendations. A written personalized care plan for preventive services as well as general preventive health recommendations were provided to patient.     Kellie Simmering, LPN  05/12/32

## 2018-10-14 NOTE — Progress Notes (Addendum)
Subjective:     Patient ID: Jasmine Lopez , female    DOB: February 04, 1941 , 78 y.o.   MRN: [id number]   Chief Complaint  Patient presents with  . Hypertension  . Diabetes    HPI Pt is here for DM and HTN FU and medicare wellness visit with our LPN. She is accompanied by her son.  Her fasting glucose  Has been 130-140 BP's 135-140/ 68-70 She has hx of intermittent dry eyes and mouth, and her lids have been getting itching and saline and antihistanmines is not helpin 2- L trap/ scapula pain off and on x 6 month. Chiropractor visits have helped but recommended her to get an xray of her L shoulder due to her breast cancer hx.  3- has been dealing with chronic cough and her dentist gave her a zpack for dental infection and seemed to help her cough, but has returned. She also has been having itching of both her eye lids and has dry mouth all the time. She did not research and wonders if she has Sjogren's and would like to be tested.   Past Medical History:  Diagnosis Date  . Diabetes mellitus without complication ([place])   . Hypertension   . Irregular heart beat      Family History  Problem Relation Age of Onset  . Heart failure Mother   . COPD Mother   . Cancer Father   . Rectal cancer Maternal Grandmother   . Heart attack Maternal Grandfather      Current Outpatient Medications:  .  allopurinol (ZYLOPRIM) 100 MG tablet, TAKE 1 TABLET BY MOUTH EVERY DAY, Disp: 90 tablet, Rfl: 0 .  amLODipine (NORVASC) 10 MG tablet, Take 1 tablet (10 mg total) by mouth daily., Disp: 30 tablet, Rfl: 1 .  Biotin 10000 MCG TBDP, 1 qd, Disp: , Rfl:  .  blood glucose meter kit and supplies KIT, Dispense based on patient and insurance preference. Use up to two times daily as directed. (FOR ICD-9 250.00, 250.01)., Disp: 1 each, Rfl: 0 .  buPROPion (WELLBUTRIN XL) 300 MG 24 hr tablet, TAKE ONE TABLET (300 MG DOSE) BY MOUTH DAILY., Disp: , Rfl: 3 .  carvedilol (COREG) 6.25 MG tablet, TAKE 1 TABLET BY MOUTH  TWICE A DAY, Disp: 180 tablet, Rfl: 3 .  chlorthalidone (HYGROTON) 25 MG tablet, , Disp: , Rfl:  .  Cholecalciferol (VITAMIN D3) 2000 units TABS, Take by mouth., Disp: , Rfl:  .  glucose blood (ONE TOUCH ULTRA TEST) test strip, TEST FASTING BLOOD SUGAR AND AND 2 HOURS AFTER MEALS, Disp: , Rfl:  .  hydrochlorothiazide (HYDRODIURIL) 25 MG tablet, Take 1 tablet (25 mg total) by mouth daily., Disp: 90 tablet, Rfl: 3 .  ipratropium (ATROVENT HFA) 17 MCG/ACT inhaler, INHALE 2 PUFFS BEFORE GOING OUT IN THE COLD OR DIFFERENT ENVIROMENT THAT CAUSES COUGHS. (Patient not taking: Reported on 10/14/2018), Disp: 12.9 Inhaler, Rfl: 0 .  loratadine (CLARITIN) 10 MG tablet, TAKE 1 TABLET BY MOUTH EVERY DAY, Disp: 90 tablet, Rfl: 0 .  losartan (COZAAR) 100 MG tablet, Take 1 tablet (100 mg total) by mouth daily., Disp: 90 tablet, Rfl: 3 .  metFORMIN (GLUCOPHAGE-XR) 500 MG 24 hr tablet, TAKE 1 TABLET BY MOUTH DAILY WITH EVENING MEAL, Disp: 90 tablet, Rfl: 0 .  montelukast (SINGULAIR) 10 MG tablet, TAKE 1 TABLET BY MOUTH EVERY DAY (Patient not taking: Reported on 10/14/2018), Disp: 90 tablet, Rfl: 0 .  ONETOUCH DELICA LANCETS 51W MISC, TEST GLUCOSE DAILY FASTING AND  2 HOURS AFTER MEALS, Disp: , Rfl:    Allergies  Allergen Reactions  . Ace Inhibitors Cough  . Benadryl [Diphenhydramine] Itching    Benadryl cream     Review of Systems  Review of Systems  Constitutional: Negative for diaphoresis and unexpected weight change.  HENT: Negative for tinnitus.   Eyes: Negative for visual disturbance.  Respiratory: Negative for chest tightness and shortness of breath.   Cardiovascular: Negative for chest pain, palpitations and leg swelling.  Gastrointestinal: Negative for constipation, diarrhea and nausea.  Endocrine: Negative for polydipsia, polyphagia and polyuria.  Genitourinary: Negative for dysuria and frequency.  Skin: Negative for rash and wound. Foot exam shows no wounds, calluses or decreased sensation.   Musculoske- has pain on L mid scapula and below it, and not getting better. See HPI Neurological: Negative for dizziness, speech difficulty, weakness, numbness and headaches. Son is concerned about her memory because she asks him some times things she they already talked about. Today's Vitals   10/14/18 1251  BP: (!) 148/78  Pulse: 66  Temp: 98.2 F (36.8 C)  TempSrc: Oral  SpO2: 99%  Weight: 192 lb (87.1 kg)  Height: 5' 2.8" (1.595 m)   Body mass index is 34.23 kg/m.   Objective:  Physical Exam  Constitutional: She is oriented to person, place, and time. She appears well-developed and well-nourished. No distress.  HENT:  Head: Normocephalic and atraumatic.  Right Ear: External ear normal.  Left Ear: External ear normal.  Nose: Nose normal.  Eyes: Conjunctivae are normal. Right eye exhibits no discharge. Left eye exhibits no discharge. No scleral icterus.her upper eye lids are dray but not scaly and slightly erythematous. They are not hot or red.   Neck: Neck supple. No thyromegaly present.  No carotid bruits bilaterally  Cardiovascular: Normal rate and regular rhythm.  No murmur heard. Pulmonary/Chest: Effort normal and breath sounds normal. No respiratory distress.  Musculoskeletal: Normal range of motion. She exhibits no edema. Feet with no wounds, ulcers or calluses.  Lymphadenopathy: she has no cervical adenopathy.  Neurological: She is alert and oriented to person, place, and time.  Skin: Skin is warm and dry. Capillary refill takes less than 2 seconds. No rash noted. She is not diaphoretic.  Psychiatric: She has a normal mood and affect. Her behavior is normal. Judgment and thought content normal.  Nursing note reviewed.    Assessment And Plan:  1. Cough- chronic. Had neg xray 06/2018. Seems to be allergy related.   2. Essential hypertension- not optimal control yet. I will await on her labs before I make any changes on her meds.  - CBC no Diff - CMP14 + Anion Gap -  Lipid Profile  3. Uncontrolled type 2 diabetes mellitus with hyperglycemia (HCC) chronic - Hemoglobin A1c - Ambulatory referral to Ophthalmology  4. Dry mouth, unspecified- chronic  - Sjogren's syndrome antibods(ssa + ssb) - Allergens(96) Foods  5. Memory change- normal MMS today.     Advised to do brain exercises and son will look for an app that is offered online.   6. Trigger finger, right middle finger- new. Offered to send her to hand specialist and I reviewed the treatments they could offer her, but she will hold off on this. Will continue doing her hand ROM.    7- cloudy urine- asymptomatic. Urine sent out for a culture.    We will inform her about her results via her son Dolores Hoose. She has signed [employer] form.  Fu in 3 months.  Cristan Scherzer RODRIGUEZ-SOUTHWORTH, PA-C    THE PATIENT IS ENCOURAGED TO PRACTICE SOCIAL DISTANCING DUE TO THE COVID-19 PANDEMIC.

## 2018-10-14 NOTE — Patient Instructions (Signed)
[name] , Thank you for taking time to come for your Medicare Wellness Visit. I appreciate your ongoing commitment to your health goals. Please review the following plan we discussed and let me know if I can assist you in the future.   Screening recommendations/referrals: Colonoscopy: requesting Mammogram: 02/2018 Bone Density: 5 yrs ago per patient Recommended yearly ophthalmology/optometry visit for glaucoma screening and checkup Recommended yearly dental visit for hygiene and checkup  Vaccinations: Influenza vaccine: declines Pneumococcal vaccine: declines Tdap vaccine: declines Shingles vaccine: discussed    Advanced directives: Please bring a copy of your POA (Power of Attorney) and/or Living Will to your next appointment.    Conditions/risks identified: Obesity  Next appointment: 10/14/2018   Preventive Care 65 Years and Older, Female Preventive care refers to lifestyle choices and visits with your health care provider that can promote health and wellness. What does preventive care include?  A yearly physical exam. This is also called an annual well check.  Dental exams once or twice a year.  Routine eye exams. Ask your health care provider how often you should have your eyes checked.  Personal lifestyle choices, including:  Daily care of your teeth and gums.  Regular physical activity.  Eating a healthy diet.  Avoiding tobacco and drug use.  Limiting alcohol use.  Practicing safe sex.  Taking low-dose aspirin every day.  Taking vitamin and mineral supplements as recommended by your health care provider. What happens during an annual well check? The services and screenings done by your health care provider during your annual well check will depend on your age, overall health, lifestyle risk factors, and family history of disease. Counseling  Your health care provider may ask you questions about your:  Alcohol use.  Tobacco use.  Drug use.  Emotional  well-being.  Home and relationship well-being.  Sexual activity.  Eating habits.  History of falls.  Memory and ability to understand (cognition).  Work and work Statistician.  Reproductive health. Screening  You may have the following tests or measurements:  Height, weight, and BMI.  Blood pressure.  Lipid and cholesterol levels. These may be checked every 5 years, or more frequently if you are over 28 years old.  Skin check.  Lung cancer screening. You may have this screening every year starting at age 78 if you have a 30-pack-year history of smoking and currently smoke or have quit within the past 15 years.  Fecal occult blood test (FOBT) of the stool. You may have this test every year starting at age 57.  Flexible sigmoidoscopy or colonoscopy. You may have a sigmoidoscopy every 5 years or a colonoscopy every 10 years starting at age 76.  Hepatitis C blood test.  Hepatitis B blood test.  Sexually transmitted disease (STD) testing.  Diabetes screening. This is done by checking your blood sugar (glucose) after you have not eaten for a while (fasting). You may have this done every 1-3 years.  Bone density scan. This is done to screen for osteoporosis. You may have this done starting at age 31.  Mammogram. This may be done every 1-2 years. Talk to your health care provider about how often you should have regular mammograms. Talk with your health care provider about your test results, treatment options, and if necessary, the need for more tests. Vaccines  Your health care provider may recommend certain vaccines, such as:  Influenza vaccine. This is recommended every year.  Tetanus, diphtheria, and acellular pertussis (Tdap, Td) vaccine. You may need a  Td booster every 10 years.  Zoster vaccine. You may need this after age 75.  Pneumococcal 13-valent conjugate (PCV13) vaccine. One dose is recommended after age 76.  Pneumococcal polysaccharide (PPSV23) vaccine. One  dose is recommended after age 47. Talk to your health care provider about which screenings and vaccines you need and how often you need them. This information is not intended to replace advice given to you by your health care provider. Make sure you discuss any questions you have with your health care provider. Document Released: 05/25/2015 Document Revised: 01/16/2016 Document Reviewed: 02/27/2015 Elsevier Interactive Patient Education  2017 [place] Prevention in the Home Falls can cause injuries. They can happen to people of all ages. There are many things you can do to make your home safe and to help prevent falls. What can I do on the outside of my home?  Regularly fix the edges of walkways and driveways and fix any cracks.  Remove anything that might make you trip as you walk through a door, such as a raised step or threshold.  Trim any bushes or trees on the path to your home.  Use bright outdoor lighting.  Clear any walking paths of anything that might make someone trip, such as rocks or tools.  Regularly check to see if handrails are loose or broken. Make sure that both sides of any steps have handrails.  Any raised decks and porches should have guardrails on the edges.  Have any leaves, snow, or ice cleared regularly.  Use sand or salt on walking paths during winter.  Clean up any spills in your garage right away. This includes oil or grease spills. What can I do in the bathroom?  Use night lights.  Install grab bars by the toilet and in the tub and shower. Do not use towel bars as grab bars.  Use non-skid mats or decals in the tub or shower.  If you need to sit down in the shower, use a plastic, non-slip stool.  Keep the floor dry. Clean up any water that spills on the floor as soon as it happens.  Remove soap buildup in the tub or shower regularly.  Attach bath mats securely with double-sided non-slip rug tape.  Do not have throw rugs and other  things on the floor that can make you trip. What can I do in the bedroom?  Use night lights.  Make sure that you have a light by your bed that is easy to reach.  Do not use any sheets or blankets that are too big for your bed. They should not hang down onto the floor.  Have a firm chair that has side arms. You can use this for support while you get dressed.  Do not have throw rugs and other things on the floor that can make you trip. What can I do in the kitchen?  Clean up any spills right away.  Avoid walking on wet floors.  Keep items that you use a lot in easy-to-reach places.  If you need to reach something above you, use a strong step stool that has a grab bar.  Keep electrical cords out of the way.  Do not use floor polish or wax that makes floors slippery. If you must use wax, use non-skid floor wax.  Do not have throw rugs and other things on the floor that can make you trip. What can I do with my stairs?  Do not leave any items on the stairs.  Make sure that there are handrails on both sides of the stairs and use them. Fix handrails that are broken or loose. Make sure that handrails are as long as the stairways.  Check any carpeting to make sure that it is firmly attached to the stairs. Fix any carpet that is loose or worn.  Avoid having throw rugs at the top or bottom of the stairs. If you do have throw rugs, attach them to the floor with carpet tape.  Make sure that you have a light switch at the top of the stairs and the bottom of the stairs. If you do not have them, ask someone to add them for you. What else can I do to help prevent falls?  Wear shoes that:  Do not have high heels.  Have rubber bottoms.  Are comfortable and fit you well.  Are closed at the toe. Do not wear sandals.  If you use a stepladder:  Make sure that it is fully opened. Do not climb a closed stepladder.  Make sure that both sides of the stepladder are locked into place.  Ask  someone to hold it for you, if possible.  Clearly mark and make sure that you can see:  Any grab bars or handrails.  First and last steps.  Where the edge of each step is.  Use tools that help you move around (mobility aids) if they are needed. These include:  Canes.  Walkers.  Scooters.  Crutches.  Turn on the lights when you go into a dark area. Replace any light bulbs as soon as they burn out.  Set up your furniture so you have a clear path. Avoid moving your furniture around.  If any of your floors are uneven, fix them.  If there are any pets around you, be aware of where they are.  Review your medicines with your doctor. Some medicines can make you feel dizzy. This can increase your chance of falling. Ask your doctor what other things that you can do to help prevent falls. This information is not intended to replace advice given to you by your health care provider. Make sure you discuss any questions you have with your health care provider. Document Released: 02/22/2009 Document Revised: 10/04/2015 Document Reviewed: 06/02/2014 Elsevier Interactive Patient Education  2017 [employer].

## 2018-10-15 LAB — CMP14 + ANION GAP
ALT: 21 IU/L (ref 0–32)
AST: 21 IU/L (ref 0–40)
Albumin/Globulin Ratio: 1.8 (ref 1.2–2.2)
Albumin: 4.6 g/dL (ref 3.7–4.7)
Alkaline Phosphatase: 75 IU/L (ref 39–117)
Anion Gap: 18 mmol/L (ref 10.0–18.0)
BUN/Creatinine Ratio: 12 (ref 12–28)
BUN: 12 mg/dL (ref 8–27)
Bilirubin Total: 0.4 mg/dL (ref 0.0–1.2)
CO2: 24 mmol/L (ref 20–29)
Calcium: 9.8 mg/dL (ref 8.7–10.3)
Chloride: 100 mmol/L (ref 96–106)
Creatinine, Ser: 1.04 mg/dL — ABNORMAL HIGH (ref 0.57–1.00)
GFR calc Af Amer: 60 mL/min/{1.73_m2} (ref >59)
GFR calc non Af Amer: 52 mL/min/{1.73_m2} — ABNORMAL LOW (ref >59)
Globulin, Total: 2.6 g/dL (ref 1.5–4.5)
Glucose: 110 mg/dL — ABNORMAL HIGH (ref 65–99)
Potassium: 4.1 mmol/L (ref 3.5–5.2)
Sodium: 142 mmol/L (ref 134–144)
Total Protein: 7.2 g/dL (ref 6.0–8.5)

## 2018-10-15 LAB — CBC
Hematocrit: 41.5 % (ref 34.0–46.6)
Hemoglobin: 14.3 g/dL (ref 11.1–15.9)
MCH: 29.9 pg (ref 26.6–33.0)
MCHC: 34.5 g/dL (ref 31.5–35.7)
MCV: 87 fL (ref 79–97)
Platelets: 243 10*3/uL (ref 150–450)
RBC: 4.79 x10E6/uL (ref 3.77–5.28)
RDW: 12.6 % (ref 11.7–15.4)
WBC: 5.6 10*3/uL (ref 3.4–10.8)

## 2018-10-15 LAB — LIPID PANEL
Chol/HDL Ratio: 3.4 ratio (ref 0.0–4.4)
Cholesterol, Total: 208 mg/dL — ABNORMAL HIGH (ref 100–199)
HDL: 62 mg/dL (ref >39)
LDL Calculated: 131 mg/dL — ABNORMAL HIGH (ref 0–99)
Triglycerides: 77 mg/dL (ref 0–149)
VLDL Cholesterol Cal: 15 mg/dL (ref 5–40)

## 2018-10-15 LAB — HEMOGLOBIN A1C
Est. average glucose Bld gHb Est-mCnc: 148 mg/dL
Hgb A1c MFr Bld: 6.8 % — ABNORMAL HIGH (ref 4.8–5.6)

## 2018-10-19 LAB — ALLERGENS(96) FOODS
Allergen Apple, IgE: <0.1 kU/L
Allergen Banana IgE: <0.1 kU/L
Allergen Barley IgE: <0.1 kU/L
Allergen Cabbage IgE: <0.1 kU/L
Allergen Carrot IgE: <0.1 kU/L
Allergen Celery IgE: <0.1 kU/L
Allergen Cinnamon IgE: <0.1 kU/L
Allergen Coconut IgE: <0.1 kU/L
Allergen Corn, IgE: <0.1 kU/L
Allergen Garlic IgE: <0.1 kU/L
Allergen Gluten IgE: <0.1 kU/L
Allergen Grapefruit IgE: <0.1 kU/L
Allergen Green Bell Pepper IgE: <0.1 kU/L
Allergen Green Pea IgE: <0.1 kU/L
Allergen Lamb IgE: <0.1 kU/L
Allergen Lettuce IgE: <0.1 kU/L
Allergen Melon IgE: <0.1 kU/L
Allergen Oat IgE: <0.1 kU/L
Allergen Onion IgE: <0.1 kU/L
Allergen Pear IgE: <0.1 kU/L
Allergen Potato, White IgE: <0.1 kU/L
Allergen Rice IgE: <0.1 kU/L
Allergen Salmon IgE: <0.1 kU/L
Allergen Strawberry IgE: <0.1 kU/L
Allergen Sweet Potato IgE: <0.1 kU/L
Allergen Tomato, IgE: <0.1 kU/L
Allergen Watermelon IgE: <0.1 kU/L
Allergen, Peach f95: <0.1 kU/L
Beef IgE: <0.1 kU/L
Chicken IgE: <0.1 kU/L
Chocolate/Cacao IgE: <0.1 kU/L
Clam IgE: <0.1 kU/L
Codfish IgE: <0.1 kU/L
Coffee: <0.1 kU/L
Cranberry IgE: <0.1 kU/L
Egg White IgE: <0.1 kU/L
F020-IgE Almond: <0.1 kU/L
F023-IgE Crab: <0.1 kU/L
F045-IgE Yeast: <0.1 kU/L
F076-IgE Alpha Lactalbumin: <0.1 kU/L
F077-IgE Beta Lactoglobulin: <0.1 kU/L
F078-IgE Casein: <0.1 kU/L
F080-IgE Lobster: <0.1 kU/L
F081-IgE Cheese, Cheddar Type: <0.1 kU/L
F089-IgE Mustard: <0.1 kU/L
F096-IgE Avocado: <0.1 kU/L
F202-IgE Cashew Nut: <0.1 kU/L
F214-IgE Spinach: <0.1 kU/L
F300-IgE Goat's Milk: <0.1 kU/L
F342-IgE Olive, Black: <0.1 kU/L
F343-IgE Raspberry: <0.1 kU/L
IgE Egg (Yolk): <0.1 kU/L
Lemon: <0.1 kU/L
Lima Bean IgE: <0.1 kU/L
Malt: <0.1 kU/L
Mushroom IgE: <0.1 kU/L
Orange: <0.1 kU/L
Peanut IgE: <0.1 kU/L
Pineapple IgE: <0.1 kU/L
Pork IgE: <0.1 kU/L
Red Beet: <0.1 kU/L
Rye IgE: <0.1 kU/L
Scallop IgE: <0.1 kU/L
Sesame Seed IgE: <0.1 kU/L
Shrimp IgE: <0.1 kU/L
Soybean IgE: <0.1 kU/L
Tuna: <0.1 kU/L
Walnut IgE: <0.1 kU/L
Wheat IgE: <0.1 kU/L
Whey: <0.1 kU/L
White Bean IgE: <0.1 kU/L

## 2018-10-19 LAB — SJOGREN'S SYNDROME ANTIBODS(SSA + SSB)
ENA SSA (RO) Ab: <0.2 AI (ref 0.0–0.9)
ENA SSB (LA) Ab: <0.2 AI (ref 0.0–0.9)

## 2018-10-28 DIAGNOSIS — E782 Mixed hyperlipidemia: Secondary | ICD-10-CM | POA: Insufficient documentation

## 2018-11-18 ENCOUNTER — Other Ambulatory Visit: Payer: Self-pay | Admitting: Internal Medicine

## 2018-11-18 NOTE — Progress Notes (Unsigned)
Will you please check with [facility] about her urine culture results. They are not in her chart.

## 2018-11-29 NOTE — Progress Notes (Signed)
[facility] Note  11/30/2018     CHIEF COMPLAINT Patient presents for Diabetic Eye Exam   HISTORY OF PRESENT ILLNESS: Jasmine Lopez is a 78 y.o. female who presents to the clinic today for:   HPI    Diabetic Eye Exam    Vision is stable.  Associated Symptoms Flashes.  Negative for Distortion, Pain, Photophobia, Trauma, Jaw Claudication, Fever, Fatigue, Floaters, Blind Spot, Redness, Glare, Scalp Tenderness, Shoulder/Hip pain and Weight Loss.  Diabetes characteristics include Type 2 and taking oral medications.  This started 4 years ago.  Blood sugar level fluctuates.  Last Blood Glucose 140.  Last A1C 6.8 (June 2020).  I, the attending physician,  performed the HPI with the patient and updated documentation appropriately.          Comments    HgA1c: 6.8 BS: 140 Patient states her vision has been stable in both eyes.  She complains of occasional flash of light when moving her eyes from side to side.  She denies any eye pain or discomfort.  Patient recently moved back to the [place] area from [place] after she lost her Mother and Husband and has not yet established a general ophthalmologist.       Last edited by Bernarda Caffey, MD on 11/30/2018  1:54 PM. (History)     NIDDM for about 4-5 years. Patient states blood sugars fluctuate, last a1c was 6.8 in June 2020. Vision seems stable in both eyes. Occasional flash if moves eyes from side to side.   Referring physician: Shelby Mattocks, PA-C [address],  [place] [id number]  HISTORICAL INFORMATION:   Selected notes from the MEDICAL RECORD NUMBER Referred by Dr. Baird Cancer for concern of diabetic retinopathy LEE:  Ocular Hx- PMH-   CURRENT MEDICATIONS: No current outpatient medications on file. (Ophthalmic Drugs)   No current facility-administered medications for this visit.  (Ophthalmic Drugs)   Current Outpatient Medications (Other)  Medication Sig  .  allopurinol (ZYLOPRIM) 100 MG tablet TAKE 1 TABLET BY MOUTH EVERY DAY  . amLODipine (NORVASC) 10 MG tablet Take 1 tablet (10 mg total) by mouth daily.  . Biotin 10000 MCG TBDP 1 qd  . blood glucose meter kit and supplies KIT Dispense based on patient and insurance preference. Use up to two times daily as directed. (FOR ICD-9 250.00, 250.01).  Marland Kitchen buPROPion (WELLBUTRIN XL) 300 MG 24 hr tablet TAKE ONE TABLET (300 MG DOSE) BY MOUTH DAILY.  . carvedilol (COREG) 6.25 MG tablet TAKE 1 TABLET BY MOUTH TWICE A DAY  . chlorthalidone (HYGROTON) 25 MG tablet   . Cholecalciferol (VITAMIN D3) 2000 units TABS Take by mouth.  Marland Kitchen glucose blood (ONE TOUCH ULTRA TEST) test strip TEST FASTING BLOOD SUGAR AND AND 2 HOURS AFTER MEALS  . hydrochlorothiazide (HYDRODIURIL) 25 MG tablet Take 1 tablet (25 mg total) by mouth daily.  Marland Kitchen ipratropium (ATROVENT HFA) 17 MCG/ACT inhaler INHALE 2 PUFFS BEFORE GOING OUT IN THE COLD OR DIFFERENT ENVIROMENT THAT CAUSES COUGHS. (Patient not taking: Reported on 10/14/2018)  . loratadine (CLARITIN) 10 MG tablet TAKE 1 TABLET BY MOUTH EVERY DAY  . losartan (COZAAR) 100 MG tablet Take 1 tablet (100 mg total) by mouth daily.  . metFORMIN (GLUCOPHAGE-XR) 500 MG 24 hr tablet TAKE 1 TABLET BY MOUTH DAILY WITH EVENING MEAL  . montelukast (SINGULAIR) 10 MG tablet TAKE 1 TABLET BY MOUTH EVERY DAY (Patient not taking: Reported on 10/14/2018)  . ONETOUCH DELICA LANCETS 59F MISC TEST  GLUCOSE DAILY FASTING AND 2 HOURS AFTER MEALS   No current facility-administered medications for this visit.  (Other)      REVIEW OF SYSTEMS: ROS    Positive for: Eyes   Negative for: Constitutional, Gastrointestinal, Neurological, Skin, Genitourinary, Musculoskeletal, HENT, Endocrine, Cardiovascular, Respiratory, Psychiatric, Allergic/Imm, Heme/Lymph   Last edited by Doneen Poisson on 11/30/2018  1:29 PM. (History)       ALLERGIES Allergies  Allergen Reactions  . Ace Inhibitors Cough  . Benadryl  [Diphenhydramine] Itching    Benadryl cream    PAST MEDICAL HISTORY Past Medical History:  Diagnosis Date  . Diabetes mellitus without complication ([place])   . Hypertension   . Irregular heart beat    History reviewed. No pertinent surgical history.  FAMILY HISTORY Family History  Problem Relation Age of Onset  . Heart failure Mother   . COPD Mother   . Cancer Father   . Rectal cancer Maternal Grandmother   . Heart attack Maternal Grandfather     SOCIAL HISTORY Social History   Tobacco Use  . Smoking status: Never Smoker  . Smokeless tobacco: Never Used  Substance Use Topics  . Alcohol use: Never    Frequency: Never  . Drug use: Never         OPHTHALMIC EXAM:  Base Eye Exam    Visual Acuity (Snellen - Linear)      Right Left   Dist [place] 20/40 -2 20/30 -3   Dist ph [place] 20/30 -2 20/25 +2       Tonometry (Tonopen, 1:27 PM)      Right Left   Pressure 13 13       Pupils      Dark Light Shape React APD   Right 3 2 Round Brisk 0   Left 3 2 Round Brisk 0       Visual Fields (Counting fingers)      Left Right    Full Full       Extraocular Movement      Right Left    Full Full       Neuro/Psych    Oriented x3: Yes   Mood/Affect: Normal       Dilation    Both eyes: 1.0% Mydriacyl, 2.5% Phenylephrine @ 1:27 PM        Slit Lamp and Fundus Exam    Slit Lamp Exam      Right Left   Lids/Lashes Dermatochalasis - upper lid Dermatochalasis - upper lid   Conjunctiva/Sclera mild melanosis mild melanosis   Cornea mild arcus, 1-2+ PEE, mild tear film debris mild arcus, 3+ PEE, decreased TBUT   Anterior Chamber deep and clear deep and clear   Iris round and dilated, no NVI round and dilated, no NVI   Lens 2+ ns, 2-3+cortical 2+ ns, 2-3+cortical   Vitreous syneresis, PVD syneresis       Fundus Exam      Right Left   Disc pink and sharp pink and sharp   C/D Ratio 0.55 0.5   Macula flat, blunted foveal reflex, no heme or edema flat, blunted foveal  reflex, no heme or edema   Vessels Vascular attenuation Vascular attenuation   Periphery attached, focal pigment clump at 07:00, no heme attached, no heme        Refraction    Wearing Rx   Has bifocals but does not wear.  Did not bring today.       Manifest Refraction  Sphere Cylinder Axis Dist VA   Right +0.50 +0.50 180 20/25-2   Left +1.25 +0.50 030 20/25-1          IMAGING AND PROCEDURES  Imaging and Procedures for '@TODAY' @  OCT, Retina - OU - Both Eyes       Right Eye Quality was good. Central Foveal Thickness: 223. Progression has no prior data. Findings include normal foveal contour, no IRF, no SRF.   Left Eye Quality was good. Central Foveal Thickness: 225. Progression has no prior data. Findings include normal foveal contour, no IRF, no SRF.   Notes *Images captured and stored on drive  Diagnosis / Impression:  OU: NFP, no IRF/SRF No DME OU  Clinical management:  See below  Abbreviations: NFP - Normal foveal profile. CME - cystoid macular edema. PED - pigment epithelial detachment. IRF - intraretinal fluid. SRF - subretinal fluid. EZ - ellipsoid zone. ERM - epiretinal membrane. ORA - outer retinal atrophy. ORT - outer retinal tubulation. SRHM - subretinal hyper-reflective material                 ASSESSMENT/PLAN:    ICD-10-CM   1. Diabetes mellitus type 2 without retinopathy ([place])  E11.9   2. Retinal edema  H35.81 OCT, Retina - OU - Both Eyes  3. Essential hypertension  I10   4. Hypertensive retinopathy of both eyes  H35.033   5. Combined forms of age-related cataract of both eyes  H25.813     1,2. Diabetes mellitus, type 2 without retinopathy OU - The incidence, risk factors for progression, natural history and treatment options for diabetic retinopathy  were discussed with patient.   - The need for close monitoring of blood glucose, blood pressure, and serum lipids, avoiding cigarette or any type of tobacco, and the need for long term  follow up was also discussed with patient. - f/u in 1 year, sooner prn  3,4. Hypertensive retinopathy OU - discussed importance of tight BP control - monitor  5. Mixed cataracts OU - The symptoms of cataract, surgical options, and treatments and risks were discussed with patient. - discussed diagnosis and progression - approaching visual significance - pt recently moved to [place] from [place], has not established w/ a general ophthalmologist yet - will [facility] to [facility] for cataract evaluation and establishment of primary eye care   Ophthalmic Meds Ordered this visit:  No orders of the defined types were placed in this encounter.      Return 12 months, for DFE, OCT.  There are no Patient Instructions on file for this visit.   Explained the diagnoses, plan, and follow up with the patient and they expressed understanding.  Patient expressed understanding of the importance of proper follow up care.   This document serves as a record of services personally performed by Gardiner Sleeper, MD, PhD. It was created on their behalf by Ernest Mallick, OA, an ophthalmic assistant. The creation of this record is the provider's dictation and/or activities during the visit.    Electronically signed by: Ernest Mallick, OA  07.20.2020 1:57 PM    Gardiner Sleeper, M.D., Ph.D. Diseases & Surgery of the Retina and Vitreous [facility]  I have reviewed the above documentation for accuracy and completeness, and I agree with the above. Gardiner Sleeper, M.D., Ph.D. 11/30/18 1:57 PM    Abbreviations: M myopia (nearsighted); A astigmatism; H hyperopia (farsighted); P presbyopia; Mrx spectacle prescription;  CTL contact lenses; OD right eye; OS  left eye; OU both eyes  XT exotropia; ET esotropia; PEK punctate epithelial keratitis; PEE punctate epithelial erosions; DES dry eye syndrome; MGD meibomian gland dysfunction; ATs artificial tears; PFAT's preservative free artificial  tears; [place] nuclear sclerotic cataract; PSC posterior subcapsular cataract; ERM epi-retinal membrane; PVD posterior vitreous detachment; RD retinal detachment; DM diabetes mellitus; DR diabetic retinopathy; NPDR non-proliferative diabetic retinopathy; PDR proliferative diabetic retinopathy; CSME clinically significant macular edema; DME diabetic macular edema; dbh dot blot hemorrhages; CWS cotton wool spot; POAG primary open angle glaucoma; C/D cup-to-disc ratio; HVF humphrey visual field; GVF goldmann visual field; OCT optical coherence tomography; IOP intraocular pressure; BRVO Branch retinal vein occlusion; CRVO central retinal vein occlusion; CRAO central retinal artery occlusion; BRAO branch retinal artery occlusion; RT retinal tear; SB scleral buckle; PPV pars plana vitrectomy; VH Vitreous hemorrhage; PRP panretinal laser photocoagulation; IVK intravitreal kenalog; VMT vitreomacular traction; MH Macular hole;  NVD neovascularization of the disc; NVE neovascularization elsewhere; AREDS age related eye disease study; ARMD age related macular degeneration; POAG primary open angle glaucoma; EBMD epithelial/anterior basement membrane dystrophy; ACIOL anterior chamber intraocular lens; IOL intraocular lens; PCIOL posterior chamber intraocular lens; Phaco/IOL phacoemulsification with intraocular lens placement; [place] photorefractive keratectomy; LASIK laser assisted in situ keratomileusis; HTN hypertension; DM diabetes mellitus; COPD chronic obstructive pulmonary disease

## 2018-11-30 ENCOUNTER — Other Ambulatory Visit: Payer: Self-pay

## 2018-11-30 ENCOUNTER — Encounter (INDEPENDENT_AMBULATORY_CARE_PROVIDER_SITE_OTHER): Payer: Self-pay | Admitting: Ophthalmology

## 2018-11-30 ENCOUNTER — Ambulatory Visit (INDEPENDENT_AMBULATORY_CARE_PROVIDER_SITE_OTHER): Payer: Medicare Other | Admitting: Ophthalmology

## 2018-11-30 DIAGNOSIS — H3581 Retinal edema: Secondary | ICD-10-CM

## 2018-11-30 DIAGNOSIS — H35033 Hypertensive retinopathy, bilateral: Secondary | ICD-10-CM | POA: Diagnosis not present

## 2018-11-30 DIAGNOSIS — I10 Essential (primary) hypertension: Secondary | ICD-10-CM | POA: Diagnosis not present

## 2018-11-30 DIAGNOSIS — E119 Type 2 diabetes mellitus without complications: Secondary | ICD-10-CM | POA: Diagnosis not present

## 2018-11-30 DIAGNOSIS — H25813 Combined forms of age-related cataract, bilateral: Secondary | ICD-10-CM

## 2018-12-01 LAB — URINE CULTURE

## 2018-12-07 ENCOUNTER — Encounter: Payer: Self-pay | Admitting: Nurse Practitioner

## 2018-12-07 ENCOUNTER — Ambulatory Visit: Payer: Medicare Other | Admitting: Nurse Practitioner

## 2018-12-07 ENCOUNTER — Ambulatory Visit (INDEPENDENT_AMBULATORY_CARE_PROVIDER_SITE_OTHER): Payer: Medicare Other | Admitting: Nurse Practitioner

## 2018-12-07 ENCOUNTER — Other Ambulatory Visit: Payer: Self-pay

## 2018-12-07 VITALS — BP 140/74 | HR 66

## 2018-12-07 DIAGNOSIS — R05 Cough: Secondary | ICD-10-CM | POA: Diagnosis not present

## 2018-12-07 DIAGNOSIS — R053 Chronic cough: Secondary | ICD-10-CM

## 2018-12-07 NOTE — Progress Notes (Signed)
Virtual Visit via video   This visit type was conducted due to national recommendations for restrictions regarding the COVID-19 Pandemic (e.g. social distancing) in an effort to limit this patient's exposure and mitigate transmission in our community.  Due to her co-morbid illnesses, this patient is at least at moderate risk for complications without adequate follow up.  This format is felt to be most appropriate for this patient at this time.  All issues noted in this document were discussed and addressed.  A limited physical exam was performed with this format.    This visit type was conducted due to national recommendations for restrictions regarding the COVID-19 Pandemic (e.g. social distancing) in an effort to limit this patient's exposure and mitigate transmission in our community.  Patients identity confirmed using two different identifiers.  This format is felt to be most appropriate for this patient at this time.  All issues noted in this document were discussed and addressed.  No physical exam was performed (except for noted visual exam findings with Video Visits).    Date:  12/07/2018   ID:  Jasmine Lopez, DOB [birth year], MRN [id number]  Patient Location:  Home - spoke with [name]  Provider location:   Office    Chief Complaint:  Persistent cough  History of Present Illness:    Jasmine Lopez is a 78 y.o. female who presents via video conferencing for a telehealth visit today.    The patient does not have symptoms concerning for COVID-19 infection (fever, chills, cough, or new shortness of breath).   She is having a persistent cough taking claritin.  Seasonal sinus.  She has been to ENT for evaluation and has a deviated septum.  With the change of atmosphere she will also have the coughing.  When she is eating she will also have coughing as well.   Cough This is a chronic problem. The current episode started more than 1 month ago. The cough is non-productive.  Associated symptoms include postnasal drip. Pertinent negatives include no chest pain, fever, nasal congestion or shortness of breath. The symptoms are aggravated by other. Treatments tried: antihistamine. There is no history of asthma.     Past Medical History:  Diagnosis Date  . Diabetes mellitus without complication ([place])   . Hypertension   . Irregular heart beat    No past surgical history on file.   Current Meds  Medication Sig  . allopurinol (ZYLOPRIM) 100 MG tablet TAKE 1 TABLET BY MOUTH EVERY DAY  . amLODipine (NORVASC) 10 MG tablet Take 1 tablet (10 mg total) by mouth daily.  . Biotin 10000 MCG TBDP 1 qd  . blood glucose meter kit and supplies KIT Dispense based on patient and insurance preference. Use up to two times daily as directed. (FOR ICD-9 250.00, 250.01).  Marland Kitchen buPROPion (WELLBUTRIN XL) 300 MG 24 hr tablet TAKE ONE TABLET (300 MG DOSE) BY MOUTH DAILY.  . carvedilol (COREG) 6.25 MG tablet TAKE 1 TABLET BY MOUTH TWICE A DAY  . chlorthalidone (HYGROTON) 25 MG tablet   . Cholecalciferol (VITAMIN D3) 2000 units TABS Take by mouth.  Marland Kitchen glucose blood (ONE TOUCH ULTRA TEST) test strip TEST FASTING BLOOD SUGAR AND AND 2 HOURS AFTER MEALS  . hydrochlorothiazide (HYDRODIURIL) 25 MG tablet Take 1 tablet (25 mg total) by mouth daily.  Marland Kitchen ipratropium (ATROVENT HFA) 17 MCG/ACT inhaler INHALE 2 PUFFS BEFORE GOING OUT IN THE COLD OR DIFFERENT ENVIROMENT THAT CAUSES COUGHS.  Marland Kitchen loratadine (CLARITIN) 10 MG tablet TAKE 1  TABLET BY MOUTH EVERY DAY  . losartan (COZAAR) 100 MG tablet Take 1 tablet (100 mg total) by mouth daily.  . metFORMIN (GLUCOPHAGE-XR) 500 MG 24 hr tablet TAKE 1 TABLET BY MOUTH DAILY WITH EVENING MEAL  . ONETOUCH DELICA LANCETS 04U MISC TEST GLUCOSE DAILY FASTING AND 2 HOURS AFTER MEALS     Allergies:   Ace inhibitors and Benadryl [diphenhydramine]   Social History   Tobacco Use  . Smoking status: Never Smoker  . Smokeless tobacco: Never Used  Substance Use Topics  .  Alcohol use: Never    Frequency: Never  . Drug use: Never     Family Hx: The patient's family history includes COPD in her mother; Cancer in her father; Heart attack in her maternal grandfather; Heart failure in her mother; Rectal cancer in her maternal grandmother.  ROS:   Please see the history of present illness.    Review of Systems  Constitutional: Negative for fever.  HENT: Positive for postnasal drip.   Respiratory: Positive for cough. Negative for shortness of breath.   Cardiovascular: Negative for chest pain.    All other systems reviewed and are negative.   Labs/Other Tests and Data Reviewed:    Recent Labs: 10/14/2018: ALT 21; BUN 12; Creatinine, Ser 1.04; Hemoglobin 14.3; Platelets 243; Potassium 4.1; Sodium 142   Recent Lipid Panel Lab Results  Component Value Date/Time   CHOL 208 (H) 10/14/2018 02:22 PM   TRIG 77 10/14/2018 02:22 PM   HDL 62 10/14/2018 02:22 PM   CHOLHDL 3.4 10/14/2018 02:22 PM   LDLCALC 131 (H) 10/14/2018 02:22 PM    Wt Readings from Last 3 Encounters:  10/14/18 192 lb (87.1 kg)  10/14/18 192 lb (87.1 kg)  07/14/18 195 lb (88.5 kg)     Exam:    Vital Signs:  BP 140/74   Pulse 66     Physical Exam  Constitutional: She is oriented to person, place, and time and well-developed, well-nourished, and in no distress. No distress.  Neurological: She is alert and oriented to person, place, and time.  Psychiatric: Mood, memory, affect and judgment normal.    ASSESSMENT & PLAN:    1. Persistent cough  Will send her for a barium swallow with a Mobile.  If negative will refer to lung specialist     COVID-19 Education: The signs and symptoms of COVID-19 were discussed with the patient and how to seek care for testing (follow up with PCP or arrange E-visit).  The importance of social distancing was discussed today.  Patient Risk:   After full review of this patients clinical status, I feel that they are at least moderate risk at this  time.   Medication Adjustments/Labs and Tests Ordered: Current medicines are reviewed at length with the patient today.  Concerns regarding medicines are outlined above.   Tests Ordered: No orders of the defined types were placed in this encounter.   Medication Changes: No orders of the defined types were placed in this encounter.   Disposition:  Follow up prn  Signed, [name], FNP

## 2018-12-18 ENCOUNTER — Other Ambulatory Visit: Payer: Self-pay | Admitting: Internal Medicine

## 2018-12-22 ENCOUNTER — Other Ambulatory Visit: Payer: Self-pay | Admitting: Nurse Practitioner

## 2018-12-22 DIAGNOSIS — R1310 Dysphagia, unspecified: Secondary | ICD-10-CM

## 2018-12-22 DIAGNOSIS — R059 Cough, unspecified: Secondary | ICD-10-CM

## 2018-12-22 DIAGNOSIS — R05 Cough: Secondary | ICD-10-CM

## 2018-12-27 ENCOUNTER — Other Ambulatory Visit (HOSPITAL_COMMUNITY): Payer: Self-pay | Admitting: *Deleted

## 2018-12-27 DIAGNOSIS — R1310 Dysphagia, unspecified: Secondary | ICD-10-CM

## 2018-12-28 ENCOUNTER — Ambulatory Visit: Payer: Medicare Other | Admitting: Nurse Practitioner

## 2018-12-28 ENCOUNTER — Other Ambulatory Visit: Payer: Self-pay

## 2018-12-28 ENCOUNTER — Encounter: Payer: Self-pay | Admitting: Nurse Practitioner

## 2018-12-28 VITALS — BP 124/76 | HR 60 | Temp 98.3°F | Ht 63.4 in | Wt 195.6 lb

## 2018-12-28 DIAGNOSIS — Z86000 Personal history of in-situ neoplasm of breast: Secondary | ICD-10-CM | POA: Insufficient documentation

## 2018-12-28 DIAGNOSIS — N644 Mastodynia: Secondary | ICD-10-CM

## 2018-12-28 NOTE — Progress Notes (Signed)
Subjective:     Patient ID: Jasmine Lopez , female    DOB: March 20, 1941 , 78 y.o.   MRN: [id number]   Chief Complaint  Patient presents with  . Referral    patient needs a referral for oncology. patient stated she has history of breast cancer.    HPI  In 2015-2016 she had cancer cells after having left breast pain.  She was diagnosed with ductal carcinoma in situ.  She did not have chemo at that time however she reports she had taken an oral agent.  At that time she had been seeing Dr. Laurance [facility] in [place].  She relocated in 2018 and did not have any further follow up with Oncology.   Today she comes in reporting a 2 week history of having similar pain to her left breast underneath after having a "too tight" sports bra.  She is concerned this could be "breast cancer"    Past Medical History:  Diagnosis Date  . Diabetes mellitus without complication ([place])   . Hypertension   . Irregular heart beat      Family History  Problem Relation Age of Onset  . Heart failure Mother   . COPD Mother   . Cancer Father   . Rectal cancer Maternal Grandmother   . Heart attack Maternal Grandfather      Current Outpatient Medications:  .  allopurinol (ZYLOPRIM) 100 MG tablet, TAKE 1 TABLET BY MOUTH EVERY DAY, Disp: 90 tablet, Rfl: 0 .  amLODipine (NORVASC) 10 MG tablet, Take 1 tablet (10 mg total) by mouth daily., Disp: 30 tablet, Rfl: 1 .  Biotin 10000 MCG TBDP, 1 qd, Disp: , Rfl:  .  blood glucose meter kit and supplies KIT, Dispense based on patient and insurance preference. Use up to two times daily as directed. (FOR ICD-9 250.00, 250.01)., Disp: 1 each, Rfl: 0 .  buPROPion (WELLBUTRIN XL) 300 MG 24 hr tablet, TAKE ONE TABLET (300 MG DOSE) BY MOUTH DAILY., Disp: , Rfl: 3 .  carvedilol (COREG) 6.25 MG tablet, TAKE 1 TABLET BY MOUTH TWICE A DAY, Disp: 180 tablet, Rfl: 3 .  chlorthalidone (HYGROTON) 25 MG tablet, , Disp: , Rfl:  .  Cholecalciferol (VITAMIN D3) 2000 units TABS, Take by  mouth., Disp: , Rfl:  .  glucose blood (ONE TOUCH ULTRA TEST) test strip, TEST FASTING BLOOD SUGAR AND AND 2 HOURS AFTER MEALS, Disp: , Rfl:  .  hydrochlorothiazide (HYDRODIURIL) 25 MG tablet, Take 1 tablet (25 mg total) by mouth daily., Disp: 90 tablet, Rfl: 3 .  loratadine (CLARITIN) 10 MG tablet, TAKE 1 TABLET BY MOUTH EVERY DAY, Disp: 90 tablet, Rfl: 0 .  losartan (COZAAR) 100 MG tablet, Take 1 tablet (100 mg total) by mouth daily., Disp: 90 tablet, Rfl: 3 .  metFORMIN (GLUCOPHAGE-XR) 500 MG 24 hr tablet, TAKE 1 TABLET BY MOUTH DAILY WITH EVENING MEAL, Disp: 90 tablet, Rfl: 0 .  ONETOUCH DELICA LANCETS 08M MISC, TEST GLUCOSE DAILY FASTING AND 2 HOURS AFTER MEALS, Disp: , Rfl:    Allergies  Allergen Reactions  . Ace Inhibitors Cough  . Benadryl [Diphenhydramine] Itching    Benadryl cream     Review of Systems  Constitutional: Negative.   Respiratory: Negative.  Negative for cough.   Cardiovascular: Negative.  Negative for chest pain, palpitations and leg swelling.  Genitourinary:       Left breast pain/tenderness  Musculoskeletal: Negative.   Neurological: Negative for dizziness and headaches.  Psychiatric/Behavioral: Negative.  Today's Vitals   12/28/18 1124  BP: 124/76  Pulse: 60  Temp: 98.3 F (36.8 C)  TempSrc: Oral  Weight: 195 lb 9.6 oz (88.7 kg)  Height: 5' 3.4" (1.61 m)  PainSc: 0-No pain   Body mass index is 34.21 kg/m.   Objective:  Physical Exam Constitutional:      Appearance: Normal appearance.  Cardiovascular:     Rate and Rhythm: Normal rate and regular rhythm.     Pulses: Normal pulses.     Heart sounds: Normal heart sounds.  Pulmonary:     Effort: Pulmonary effort is normal.     Breath sounds: Normal breath sounds.  Chest:     Breasts:        Right: Normal. No mass or tenderness.        Left: Tenderness (left medial breast) present. No mass.  Musculoskeletal: Normal range of motion.  Skin:    General: Skin is warm and dry.      Capillary Refill: Capillary refill takes less than 2 seconds.  Neurological:     General: No focal deficit present.     Mental Status: She is alert and oriented to person, place, and time.  Psychiatric:        Mood and Affect: Mood normal.        Behavior: Behavior normal.        Thought Content: Thought content normal.        Judgment: Judgment normal.         Assessment And Plan:   1. Pain of left breast  Tenderness to medial breast  Will send for diagnostic mammogram for further evaluation - MM Digital Diagnostic Unilat L; Future  2. History of ductal carcinoma in situ (DCIS) of breast  Treated in 2014-2015 with oral chemo and had been followed by Oncology   [name], FNP    THE PATIENT IS ENCOURAGED TO PRACTICE SOCIAL DISTANCING DUE TO THE COVID-19 PANDEMIC.

## 2018-12-29 ENCOUNTER — Other Ambulatory Visit: Payer: Self-pay | Admitting: Internal Medicine

## 2018-12-29 ENCOUNTER — Other Ambulatory Visit: Payer: Self-pay | Admitting: Nurse Practitioner

## 2018-12-29 DIAGNOSIS — N644 Mastodynia: Secondary | ICD-10-CM

## 2018-12-31 ENCOUNTER — Ambulatory Visit (HOSPITAL_COMMUNITY)
Admission: RE | Admit: 2018-12-31 | Discharge: 2018-12-31 | Disposition: A | Discharge status: Home / Self Care | Payer: Medicare Other | Source: Ambulatory Visit | Attending: Nurse Practitioner | Admitting: Nurse Practitioner

## 2018-12-31 ENCOUNTER — Other Ambulatory Visit: Payer: Self-pay

## 2018-12-31 DIAGNOSIS — R1310 Dysphagia, unspecified: Secondary | ICD-10-CM

## 2018-12-31 DIAGNOSIS — R059 Cough, unspecified: Secondary | ICD-10-CM

## 2018-12-31 DIAGNOSIS — R05 Cough: Secondary | ICD-10-CM | POA: Diagnosis present

## 2018-12-31 NOTE — Progress Notes (Addendum)
Modified Barium Swallow Progress Note  Patient Details  Name: Jasmine Lopez MRN: [id number] Date of Birth: [birth year]  Today's Date: 12/31/2018  Modified Barium Swallow completed.  Full report located under Chart Review in the Imaging Section.  Brief recommendations include the following:  Clinical Impression  Pt demonstrates normal oral and oropharyngeal function. Discussed symptoms suggesting cough hypersensitivty syndrome and encouraged her to discuss with MD. OP SLP can address cough therapy if warranted.    Swallow Evaluation Recommendations       SLP Diet Recommendations: Regular solids;Thin liquid   Liquid Administration via: Cup;Straw   Medication Administration: Whole meds with liquid   Supervision: Patient able to self feed                  Herbie [place], MA [address] Pager [phone] Office [phone]   Lynann Beaver 12/31/2018,2:08 PM

## 2019-01-04 ENCOUNTER — Ambulatory Visit
Admission: RE | Admit: 2019-01-04 | Discharge: 2019-01-04 | Disposition: A | Discharge status: Home / Self Care | Payer: Medicare Other | Source: Ambulatory Visit | Attending: Nurse Practitioner | Admitting: Nurse Practitioner

## 2019-01-04 ENCOUNTER — Other Ambulatory Visit: Payer: Self-pay

## 2019-01-04 DIAGNOSIS — N644 Mastodynia: Secondary | ICD-10-CM

## 2019-01-10 ENCOUNTER — Other Ambulatory Visit: Payer: Self-pay | Admitting: Nurse Practitioner

## 2019-01-10 DIAGNOSIS — R05 Cough: Secondary | ICD-10-CM

## 2019-01-10 DIAGNOSIS — R053 Chronic cough: Secondary | ICD-10-CM

## 2019-01-20 ENCOUNTER — Encounter: Payer: Self-pay | Admitting: Internal Medicine

## 2019-01-20 ENCOUNTER — Ambulatory Visit (INDEPENDENT_AMBULATORY_CARE_PROVIDER_SITE_OTHER): Payer: Medicare Other | Admitting: Internal Medicine

## 2019-01-20 ENCOUNTER — Telehealth: Payer: Self-pay

## 2019-01-20 ENCOUNTER — Other Ambulatory Visit: Payer: Self-pay

## 2019-01-20 VITALS — BP 132/78 | HR 73 | Temp 98.5°F | Ht 63.4 in | Wt 194.8 lb

## 2019-01-20 DIAGNOSIS — E1169 Type 2 diabetes mellitus with other specified complication: Secondary | ICD-10-CM

## 2019-01-20 DIAGNOSIS — E782 Mixed hyperlipidemia: Secondary | ICD-10-CM | POA: Diagnosis not present

## 2019-01-20 DIAGNOSIS — R011 Cardiac murmur, unspecified: Secondary | ICD-10-CM

## 2019-01-20 DIAGNOSIS — I10 Essential (primary) hypertension: Secondary | ICD-10-CM | POA: Diagnosis not present

## 2019-01-20 DIAGNOSIS — R05 Cough: Secondary | ICD-10-CM | POA: Diagnosis not present

## 2019-01-20 DIAGNOSIS — E785 Hyperlipidemia, unspecified: Secondary | ICD-10-CM

## 2019-01-20 DIAGNOSIS — R059 Cough, unspecified: Secondary | ICD-10-CM

## 2019-01-20 NOTE — Addendum Note (Signed)
Addended by: Nicki Guadalajara on: 01/20/2019 02:07 PM   Modules accepted: Orders

## 2019-01-20 NOTE — Telephone Encounter (Signed)
  Spoke with patient, she is ok with coming in for labs on Monday 01/24/2019 [place], Sunday Spillers, PA-C  Candiss Norse T, CMA        Please inform them that I was reviewing all the labs pt had and noticed the allergy panel test from June did not get ran and I tried to add it to the blood we have, but was not enough blood. They may come Monday to have it done.

## 2019-01-20 NOTE — Patient Instructions (Addendum)
DO A DIARY RECORDING WHEN YOUR COUGH GETS WORSE AND ALSO LOG THE FOODS YOU ARE EATING AND SEE IF THERE IS A COMMON FOOD IN WHICH THOSE DAYS YOU ARE COUGHING MORE.  IF YOU FIND IT, THEN ELIMINATE IT FOR A WEEK AND SEE HOW YOU DO.   PLEASE START WALKING AGAIN AND WORK ON REDUCING YOUR PORTIONS TO HELP YOU LOOSE WEIGHT.  IF YOU WANT SWEETS FIND RESEPIES MADE WITH [employer]   PLEASE BRING YOUR GLUCOSE READINGS NEXT VISIT. YOUR FASTING GLUCOSE SHOULD BE 100 OR LESS. GLUCOSE AFTER EATING SHOULD BE 140 OR LESS.   PLEASE BRING THE RECORDS OF YOUR IMMUNIZATIONS NEXT TIME AS WELL, SO WE CAN UPDATE OUR RECORDS.   AS I WAS LOOKING BACK AT YOUR PAST LABS I NOTICED THE ALLERGY TEST PANEL I ORDERED THE LAB COULD NOT RAN IT DUE TO NOT HAVING ENOUGH SPECIMEN, SO I AM ASKING THE LAB TO ADD IT TO THE CURRENT BLOOD WE HAVE TODAY.

## 2019-01-20 NOTE — Progress Notes (Addendum)
Subjective:     Patient ID: Jasmine Lopez , female    DOB: [birth year] , 78 y.o.   MRN: [id number]   Chief Complaint  Patient presents with  . Hypertension    f/u review test results     HPI Here for HTN  And DM FU. And also wants to revdiew barium swallow which was told is normal by the radiologist and was given reflux education and she has been following this. She has never seen a pulmonologist.  Per her son her cough has been getting worse x 1.5 y. She gets into a coughing fit. She has seen ENT last year with no findings and was given exercises for her vocal cords to do which help  while doing them.  Has also seen cardiology who made changes in her meds and helped but the cough came back.  She now lives in an old house that smells like mildew and her son wonders if she could be tested for mold.  She has not been walking.  Past Medical History:  Diagnosis Date  . Diabetes mellitus without complication ([place])   . Hypertension   . Irregular heart beat     Hear murmur  Family History  Problem Relation Age of Onset  . Heart failure Mother   . COPD Mother   . Cancer Father   . Rectal cancer Maternal Grandmother   . Heart attack Maternal Grandfather      Current Outpatient Medications:  .  allopurinol (ZYLOPRIM) 100 MG tablet, TAKE 1 TABLET BY MOUTH EVERY DAY, Disp: 90 tablet, Rfl: 0 .  amLODipine (NORVASC) 10 MG tablet, Take 1 tablet (10 mg total) by mouth daily., Disp: 30 tablet, Rfl: 1 .  Biotin 10000 MCG TBDP, 1 qd, Disp: , Rfl:  .  blood glucose meter kit and supplies KIT, Dispense based on patient and insurance preference. Use up to two times daily as directed. (FOR ICD-9 250.00, 250.01)., Disp: 1 each, Rfl: 0 .  buPROPion (WELLBUTRIN XL) 300 MG 24 hr tablet, TAKE ONE TABLET (300 MG DOSE) BY MOUTH DAILY., Disp: , Rfl: 3 .  carvedilol (COREG) 6.25 MG tablet, TAKE 1 TABLET BY MOUTH TWICE A DAY, Disp: 180 tablet, Rfl: 3 .  Cholecalciferol (VITAMIN D3) 2000 units TABS, Take  by mouth., Disp: , Rfl:  .  glucose blood (ONE TOUCH ULTRA TEST) test strip, TEST FASTING BLOOD SUGAR AND AND 2 HOURS AFTER MEALS, Disp: , Rfl:  .  metFORMIN (GLUCOPHAGE-XR) 500 MG 24 hr tablet, TAKE 1 TAB BY MOUTH TWICE A DAY, Disp: 180 tablet, Rfl: 1 .  ONETOUCH DELICA LANCETS 88F MISC, TEST GLUCOSE DAILY FASTING AND 2 HOURS AFTER MEALS, Disp: , Rfl:  .  chlorthalidone (HYGROTON) 25 MG tablet, , Disp: , Rfl:  .  hydrochlorothiazide (HYDRODIURIL) 25 MG tablet, Take 1 tablet (25 mg total) by mouth daily., Disp: 90 tablet, Rfl: 3 .  loratadine (CLARITIN) 10 MG tablet, TAKE 1 TABLET BY MOUTH EVERY DAY, Disp: 90 tablet, Rfl: 0 .  losartan (COZAAR) 100 MG tablet, Take 1 tablet (100 mg total) by mouth daily., Disp: 90 tablet, Rfl: 3   Allergies  Allergen Reactions  . Ace Inhibitors Cough  . Benadryl [Diphenhydramine] Itching    Benadryl cream     Review of Systems  + for intermittent cough, post nasal drainage, heart murmur( last echo 11/2017), occasional night time feet numbness, but does not know if associated with elevated glucose. Denies CP, SOB, edema, orthopnea or PND. Her  cough does get worse with laying down on her back. Denies rashes, poor healing wounds.  Today's Vitals   01/20/19 1134  BP: 132/78  Pulse: 73  Temp: 98.5 F (36.9 C)  TempSrc: Oral  Weight: 194 lb 12.8 oz (88.4 kg)  Height: 5' 3.4" (1.61 m)   Body mass index is 34.07 kg/m.   Objective:  Physical Exam  She is here with her son who is helping with the history. Constitutional: She is oriented to person, place, and time. She appears well-developed and well-nourished. No distress.  HENT:  Head: Normocephalic and atraumatic.  Right Ear: External ear normal.  Left Ear: External ear normal.  Nose: Nose normal.  Eyes: Conjunctivae are normal. Right eye exhibits no discharge. Left eye exhibits no discharge. No scleral icterus.  Neck: Neck supple. No thyromegaly present.  No carotid bruits bilaterally   Cardiovascular: Normal rate and regular rhythm.  A faint murmur heard. Pulmonary/Chest: Effort normal and breath sounds normal. No respiratory distress.  Musculoskeletal: Normal range of motion. She exhibits no edema.  Lymphadenopathy:    She has no cervical adenopathy.  Neurological: She is alert and oriented to person, place, and time.  Skin: Skin is warm and dry. Capillary refill takes less than 2 seconds. No rash noted. She is not diaphoretic.  Psychiatric: She has a normal mood and affect. Her behavior is normal. Judgment and thought content normal.  Nursing note reviewed. Assessment And Plan:    1. Cough- persistent. Has had neg ENT eval, normal swallow test, cardiology work up. - Ambulatory referral to Pulmonology - Fungus culture, urine - [id number] Stachybotrys atra - Allergen Profile, Mold - Allergens(96) Foods- this had been cancelled in June. I tried to add it, but cant be ran. I will inform pt or son if they want to come get it next week.   2. Essential hypertension- stable. May continue current meds.  - CBC with Diff - CMP14 + Anion Gap  3. Type 2 diabetes mellitus with hyperlipidemia ([place])- chronic. Needs to be more strict with her diet avoiding sweets and needs to get back to walking.  - Hemoglobin A1c  4. Mixed hyperlipidemia- chronic. Has normal thyroid function last year.  - Lipid Profile - TSH - T4, Free - T3, free  5. Heart murmur- chronic. Needs yearly cardiology FU.  - Ambulatory referral to Cardiology  FU in 1-2 weeks to review labs with her and her son.   Lorin Gawron RODRIGUEZ-SOUTHWORTH, PA-C    THE PATIENT IS ENCOURAGED TO PRACTICE SOCIAL DISTANCING DUE TO THE COVID-19 PANDEMIC.

## 2019-01-23 LAB — CBC WITH DIFFERENTIAL/PLATELET
Basophils Absolute: 0.1 10*3/uL (ref 0.0–0.2)
Basos: 1 %
EOS (ABSOLUTE): 0.3 10*3/uL (ref 0.0–0.4)
Eos: 6 %
Hematocrit: 41.2 % (ref 34.0–46.6)
Hemoglobin: 13.4 g/dL (ref 11.1–15.9)
Immature Grans (Abs): 0 10*3/uL (ref 0.0–0.1)
Immature Granulocytes: 0 %
Lymphocytes Absolute: 2 10*3/uL (ref 0.7–3.1)
Lymphs: 37 %
MCH: 29.5 pg (ref 26.6–33.0)
MCHC: 32.5 g/dL (ref 31.5–35.7)
MCV: 91 fL (ref 79–97)
Monocytes Absolute: 0.5 10*3/uL (ref 0.1–0.9)
Monocytes: 9 %
Neutrophils Absolute: 2.5 10*3/uL (ref 1.4–7.0)
Neutrophils: 47 %
Platelets: 238 10*3/uL (ref 150–450)
RBC: 4.55 x10E6/uL (ref 3.77–5.28)
RDW: 13.3 % (ref 11.7–15.4)
WBC: 5.4 10*3/uL (ref 3.4–10.8)

## 2019-01-23 LAB — ALLERGEN PROFILE, MOLD
Alternaria Alternata IgE: <0.1 kU/L
Aspergillus Fumigatus IgE: <0.1 kU/L
Aureobasidi Pullulans IgE: <0.1 kU/L
Candida Albicans IgE: <0.1 kU/L
Cladosporium Herbarum IgE: <0.1 kU/L
M009-IgE Fusarium proliferatum: <0.1 kU/L
M014-IgE Epicoccum purpur: <0.1 kU/L
Mucor Racemosus IgE: <0.1 kU/L
Penicillium Chrysogen IgE: <0.1 kU/L
Phoma Betae IgE: <0.1 kU/L
Setomelanomma Rostrat: <0.1 kU/L
Stemphylium Herbarum IgE: <0.1 kU/L

## 2019-01-23 LAB — M024-IGE STACHYBOTRYS ATRA: M024-IgE Stachybotrys atra: <0.1 kU/L

## 2019-01-23 LAB — CMP14 + ANION GAP
ALT: 14 IU/L (ref 0–32)
AST: 17 IU/L (ref 0–40)
Albumin/Globulin Ratio: 1.7 (ref 1.2–2.2)
Albumin: 4.3 g/dL (ref 3.7–4.7)
Alkaline Phosphatase: 65 IU/L (ref 39–117)
Anion Gap: 17 mmol/L (ref 10.0–18.0)
BUN/Creatinine Ratio: 16 (ref 12–28)
BUN: 16 mg/dL (ref 8–27)
Bilirubin Total: 0.3 mg/dL (ref 0.0–1.2)
CO2: 23 mmol/L (ref 20–29)
Calcium: 9.6 mg/dL (ref 8.7–10.3)
Chloride: 101 mmol/L (ref 96–106)
Creatinine, Ser: 0.98 mg/dL (ref 0.57–1.00)
GFR calc Af Amer: 65 mL/min/{1.73_m2} (ref >59)
GFR calc non Af Amer: 56 mL/min/{1.73_m2} — ABNORMAL LOW (ref >59)
Globulin, Total: 2.6 g/dL (ref 1.5–4.5)
Glucose: 136 mg/dL — ABNORMAL HIGH (ref 65–99)
Potassium: 4.4 mmol/L (ref 3.5–5.2)
Sodium: 141 mmol/L (ref 134–144)
Total Protein: 6.9 g/dL (ref 6.0–8.5)

## 2019-01-23 LAB — TSH: TSH: 1.49 u[IU]/mL (ref 0.450–4.500)

## 2019-01-23 LAB — T4, FREE: Free T4: 1.06 ng/dL (ref 0.82–1.77)

## 2019-01-23 LAB — HEMOGLOBIN A1C
Est. average glucose Bld gHb Est-mCnc: 151 mg/dL
Hgb A1c MFr Bld: 6.9 % — ABNORMAL HIGH (ref 4.8–5.6)

## 2019-01-23 LAB — LIPID PANEL
Chol/HDL Ratio: 4.1 ratio (ref 0.0–4.4)
Cholesterol, Total: 194 mg/dL (ref 100–199)
HDL: 47 mg/dL (ref >39)
LDL Chol Calc (NIH): 132 mg/dL — ABNORMAL HIGH (ref 0–99)
Triglycerides: 84 mg/dL (ref 0–149)
VLDL Cholesterol Cal: 15 mg/dL (ref 5–40)

## 2019-01-23 LAB — T3, FREE: T3, Free: 2.9 pg/mL (ref 2.0–4.4)

## 2019-01-24 ENCOUNTER — Other Ambulatory Visit: Payer: Self-pay

## 2019-01-25 ENCOUNTER — Telehealth: Payer: Self-pay

## 2019-01-25 NOTE — Telephone Encounter (Signed)
I called patient to see if the pharmacy has called her stating that her metformin is on the recall list. I have left her a v/m to call the office. YRL,RMA

## 2019-01-28 LAB — FUNGUS CULTURE, BLOOD

## 2019-02-02 ENCOUNTER — Other Ambulatory Visit: Payer: Self-pay

## 2019-02-02 ENCOUNTER — Ambulatory Visit: Payer: Medicare Other | Admitting: Internal Medicine

## 2019-02-02 ENCOUNTER — Encounter: Payer: Self-pay | Admitting: Internal Medicine

## 2019-02-02 ENCOUNTER — Other Ambulatory Visit: Payer: Medicare Other

## 2019-02-02 DIAGNOSIS — R05 Cough: Secondary | ICD-10-CM | POA: Diagnosis not present

## 2019-02-02 DIAGNOSIS — R058 Other specified cough: Secondary | ICD-10-CM | POA: Insufficient documentation

## 2019-02-02 MED ORDER — PANTOPRAZOLE SODIUM 40 MG PO TBEC: 40.0000 mg | DELAYED_RELEASE_TABLET | Freq: Every day | ORAL | 2 refills | Status: DC

## 2019-02-02 MED ORDER — BENZONATATE 200 MG PO CAPS: 200.0000 mg | ORAL_CAPSULE | Freq: Three times a day (TID) | ORAL | 1 refills | Status: DC | PRN

## 2019-02-02 MED ORDER — FAMOTIDINE 20 MG PO TABS: ORAL_TABLET | ORAL | 11 refills | Status: DC

## 2019-02-02 MED ORDER — PREDNISONE 10 MG PO TABS: ORAL_TABLET | ORAL | 0 refills | Status: DC

## 2019-02-02 NOTE — Progress Notes (Signed)
Jasmine Lopez, female    DOB: April 23, 1941,     MRN: [id number]   Brief patient profile:  33 yobf never smoker hay fever as child better p left [place] in her 48s and then started pattern of chronic cough after h1n1 and worse  fall 2018 so referred to pulmonary clinic 02/02/2019      History of Present Illness  02/02/2019  Pulmonary/ 1st office eval/Shaquala Broeker  Chief Complaint  Patient presents with  . Pulmonary Consult    Self referral. Pt c/o cough x 2 yrs- non prod and occurs when she is exposed to certain smells and occ when she lies down.   cough is daily assoc hoarseness with ent eval 08/14/8183  [facility]  "neg" - no notes in care everywhere Dyspnea:  Can walk up to an hour  Cough: dry hack/ better while asleep and not waking her up / worse with smells certain foods with sense of globus worse immediately when lies down then resolves within  Few min and does not disturb sleep Sleep: sits up for a while immediately then later able to lie flat/ sleep fine SABA use: none  Prednisone helped / mint or water helps  No obvious day to day or daytime variability or assoc excess/ purulent sputum or mucus plugs or hemoptysis or cp or chest tightness, subjective wheeze or overt sinus or hb symptoms.   sleeping without nocturnal  or early am exacerbation  of respiratory  c/o's or need for noct saba. Also denies any obvious fluctuation of symptoms with weather or environmental changes or other aggravating or alleviating factors except as outlined above   No unusual exposure hx or h/o childhood pna/ asthma or knowledge of premature birth.  Current Allergies, Complete Past Medical History, Past Surgical History, Family History, and Social History were reviewed in [employer] record.  ROS  The following are not active complaints unless bolded Hoarseness, sore throat, dysphagia, dental problems, itching, sneezing,  nasal congestion or discharge of excess mucus or purulent  secretions, ear ache,   fever, chills, sweats, unintended wt loss or wt gain, classically pleuritic or exertional cp,  orthopnea pnd or arm/hand swelling  or leg swelling, presyncope, palpitations, abdominal pain, anorexia, nausea, vomiting, diarrhea  or change in bowel habits or change in bladder habits, change in stools or change in urine, dysuria, hematuria,  rash, arthralgias, visual complaints, headache, numbness, weakness or ataxia or problems with walking or coordination,  change in mood or  memory.             Past Medical History:  Diagnosis Date  . Diabetes mellitus without complication ([place])   . Hypertension   . Irregular heart beat     Outpatient Medications Prior to Visit  Medication Sig Dispense Refill  . acetaminophen (TYLENOL) 500 MG tablet Take 500 mg by mouth every 6 (six) hours as needed.    Marland Kitchen allopurinol (ZYLOPRIM) 100 MG tablet TAKE 1 TABLET BY MOUTH EVERY DAY 90 tablet 0  . amLODipine (NORVASC) 5 MG tablet Take 1 tablet by mouth daily.    Marland Kitchen aspirin 325 MG EC tablet Take 325 mg by mouth every 4 (four) hours as needed for pain.    . Biotin 10000 MCG TBDP 1 qd    . blood glucose meter kit and supplies KIT Dispense based on patient and insurance preference. Use up to two times daily as directed. (FOR ICD-9 250.00, 250.01). 1 each 0  . buPROPion (WELLBUTRIN XL) 300 MG 24  hr tablet TAKE ONE TABLET (300 MG DOSE) BY MOUTH DAILY.  3  . carvedilol (COREG) 6.25 MG tablet TAKE 1 TABLET BY MOUTH TWICE A DAY 180 tablet 3  . Cholecalciferol (VITAMIN D3) 2000 units TABS Take by mouth.    Marland Kitchen Cod Liver Oil 1000 MG CAPS Take 1 capsule by mouth daily.    . Garlic 7867 MG CAPS Take 1 capsule by mouth daily.    Marland Kitchen glucose blood (ONE TOUCH ULTRA TEST) test strip TEST FASTING BLOOD SUGAR AND AND 2 HOURS AFTER MEALS    . hydrochlorothiazide (HYDRODIURIL) 25 MG tablet Take 1 tablet (25 mg total) by mouth daily. 90 tablet 3  . loratadine (CLARITIN) 10 MG tablet TAKE 1 TABLET BY MOUTH EVERY DAY  90 tablet 0  . losartan (COZAAR) 100 MG tablet Take 1 tablet (100 mg total) by mouth daily. 90 tablet 3  . metFORMIN (GLUCOPHAGE-XR) 500 MG 24 hr tablet TAKE 1 TAB BY MOUTH TWICE A DAY 180 tablet 1  . nystatin (MYCOSTATIN) 100000 UNIT/ML suspension Take 5 mLs by mouth 4 (four) times daily as needed.    Glory Rosebush DELICA LANCETS 67M MISC TEST GLUCOSE DAILY FASTING AND 2 HOURS AFTER MEALS    . amLODipine (NORVASC) 10 MG tablet Take 1 tablet (10 mg total) by mouth daily. 30 tablet 1  . chlorthalidone (HYGROTON) 25 MG tablet         Objective:     BP (!) 156/78 (BP Location: Left Arm, Cuff Size: Normal)   Pulse (!) 56   Temp (!) 97.2 F (36.2 C) (Oral)   Ht _0  (1.626 m)   Wt 197 lb 12.8 oz (89.7 kg)   SpO2 97% Comment: on RA  BMI 33.95 kg/m   SpO2: 97 %(on RA)   amb hoarse bf nad   HEENT : pt wearing mask not removed for exam due to covid -19 concerns.    NECK :  without JVD/Nodes/TM/ nl carotid upstrokes bilaterally   LUNGS: no acc muscle use,  Nl contour chest which is clear to A and P bilaterally without cough on insp or exp maneuvers   CV:  RRR  no s3 or murmur or increase in P2, and no edema   ABD:  soft and nontender with nl inspiratory excursion in the supine position. No bruits or organomegaly appreciated, bowel sounds nl  MS:  Nl gait/ ext warm without deformities, calf tenderness, cyanosis or clubbing No obvious joint restrictions   SKIN: warm and dry without lesions    NEURO:  alert, approp, nl sensorium with  no motor or cerebellar deficits apparent.       Assessment   Upper airway cough syndrome Onset ? p H1N1 but daily since fall 0947  - cyclical cough rx 0/96/2836 with tessalon suppression   The most common causes of chronic cough in immunocompetent adults include the following: upper airway cough syndrome (UACS), previously referred to as postnasal drip syndrome (PNDS), which is caused by variety of rhinosinus conditions; (2) asthma; (3) GERD; (4)  chronic bronchitis from cigarette smoking or other inhaled environmental irritants; (5) nonasthmatic eosinophilic bronchitis; and (6) bronchiectasis.   These conditions, singly or in combination, have accounted for up to 94% of the causes of chronic cough in prospective studies.   Other conditions have constituted no >6% of the causes in prospective studies These have included bronchogenic carcinoma, chronic interstitial pneumonia, sarcoidosis, left ventricular failure, ACEI-induced cough, and aspiration from a condition associated with pharyngeal dysfunction.  Chronic cough is often simultaneously caused by more than one condition. A single cause has been found from 38 to 82% of the time, multiple causes from 18 to 62%. Multiply caused cough has been the result of three diseases up to 42% of the time.       Upper airway cough syndrome (previously labeled PNDS),  is so named because it's frequently impossible to sort out how much is  CR/sinusitis with freq throat clearing (which can be related to primary GERD)   vs  causing  secondary (" extra esophageal")  GERD from wide swings in gastric pressure that occur with throat clearing, often  promoting self use of mint and menthol lozenges that reduce the lower esophageal sphincter tone and exacerbate the problem further in a cyclical fashion.   These are the same pts (now being labeled as having "irritable larynx syndrome" by some cough centers) who not infrequently have a history of having failed to tolerate ace inhibitors,  dry powder inhalers or biphosphonates or report having atypical/extraesophageal reflux symptoms that don't respond to standard doses of PPI  and are easily confused as having aecopd or asthma flares by even experienced allergists/ pulmonologists (myself included).  Of the three most common causes of  Sub-acute / recurrent or chronic cough, only one (GERD)  can actually contribute to/ trigger  the other two (asthma and post nasal  drip syndrome)  and perpetuate the cylce of cough.  While not intuitively obvious, many patients with chronic low grade reflux do not cough until there is a primary insult that disturbs the protective epithelial barrier and exposes sensitive nerve endings.   This is typically viral but can due to PNDS and  either may apply here.     >>> The point is that once this occurs, it is difficult to eliminate the cycle  using anything but a maximally effective acid suppression regimen at least in the short run, accompanied by an appropriate diet to address non acid GERD and control / eliminate the cough itself for at least 3 days with tessalon around the clock then use it prn  Also added 6 days of Prednisone in case of component of Th-2 driven upper or lower airways inflammation (if cough responds short term only to relapse befor return while will on rx for uacs that would point to allergic rhinitis/ asthma or eos bronchitis)    >>> f/u in 4 weeks with ENT eval review w/a    Total time devoted to counseling  > 50 % of initial 60 min office visit:  review case with pt/ discussion of options/alternatives/ personally creating written customized instructions  in presence of pt  then going over those specific  Instructions directly with the pt including how to use all of the meds but in particular covering each new medication in detail and the difference between the maintenance= "automatic" meds and the prns using an action plan format for the latter (If this problem/symptom => do that organization reading Left to right).  Please see AVS from this visit for a full list of these instructions which I personally wrote for this pt and  are unique to this visit.       Christinia Gully, MD 02/02/2019

## 2019-02-02 NOTE — Patient Instructions (Signed)
The key to effective treatment for your cough is eliminating the non-stop cycle of cough you're stuck in long enough to let your airway heal completely and then see if there is anything still making you cough once you stop the cough suppression, but this should take no more than 5 days to figure out  First take tessalon 200 mg up to every 6 hour until no cough at all x 3 straight days   Prednisone 10 mg take  4 each am x 2 days,   2 each am x 2 days,  1 each am x 2 days and stop (this is to eliminate allergies and inflammation from coughing)  Protonix (pantoprazole) Take 30-60 min before first meal of the day and Pepcid 20 mg one bedtime plus chlorpheniramine 4 mg x1 at bedtime (both available over the counter)  until cough is completely gone for at least a week without the need for cough suppression  GERD (REFLUX)  is an extremely common cause of respiratory symptoms, many times with no significant heartburn at all.    It can be treated with medication, but also with lifestyle changes including avoidance of late meals, excessive alcohol, smoking cessation, and avoid fatty foods, chocolate, peppermint, colas, red wine, and acidic juices such as orange juice.  NO MINT OR MENTHOL PRODUCTS SO NO COUGH DROPS   USE HARD CANDY INSTEAD (jolley ranchers or Stover's or Lifesavers (all available in sugarless versions) NO OIL BASED VITAMINS - use powdered substitutes.  Please schedule a follow up office visit in 4 weeks, sooner if needed  with all medications /inhalers/ solutions in hand so we can verify exactly what you are taking. This includes all medications from all doctors and over the counters  We will do cxr on return

## 2019-02-03 ENCOUNTER — Encounter: Payer: Self-pay | Admitting: Internal Medicine

## 2019-02-03 ENCOUNTER — Ambulatory Visit: Payer: Medicare Other | Admitting: Internal Medicine

## 2019-02-03 NOTE — Assessment & Plan Note (Addendum)
Onset ? p H1N1 but daily since fall [id number]  - cyclical cough rx [id number] with tessalon suppression   The most common causes of chronic cough in immunocompetent adults include the following: upper airway cough syndrome (UACS), previously referred to as postnasal drip syndrome (PNDS), which is caused by variety of rhinosinus conditions; (2) asthma; (3) GERD; (4) chronic bronchitis from cigarette smoking or other inhaled environmental irritants; (5) nonasthmatic eosinophilic bronchitis; and (6) bronchiectasis.   These conditions, singly or in combination, have accounted for up to 94% of the causes of chronic cough in prospective studies.   Other conditions have constituted no >6% of the causes in prospective studies These have included bronchogenic carcinoma, chronic interstitial pneumonia, sarcoidosis, left ventricular failure, ACEI-induced cough, and aspiration from a condition associated with pharyngeal dysfunction.    Chronic cough is often simultaneously caused by more than one condition. A single cause has been found from 38 to 82% of the time, multiple causes from 18 to 62%. Multiply caused cough has been the result of three diseases up to 42% of the time.       Upper airway cough syndrome (previously labeled PNDS),  is so named because it's frequently impossible to sort out how much is  CR/sinusitis with freq throat clearing (which can be related to primary GERD)   vs  causing  secondary (" extra esophageal")  GERD from wide swings in gastric pressure that occur with throat clearing, often  promoting self use of mint and menthol lozenges that reduce the lower esophageal sphincter tone and exacerbate the problem further in a cyclical fashion.   These are the same pts (now being labeled as having "irritable larynx syndrome" by some cough centers) who not infrequently have a history of having failed to tolerate ace inhibitors,  dry powder inhalers or biphosphonates or report having  atypical/extraesophageal reflux symptoms that don't respond to standard doses of PPI  and are easily confused as having aecopd or asthma flares by even experienced allergists/ pulmonologists (myself included).  Of the three most common causes of  Sub-acute / recurrent or chronic cough, only one (GERD)  can actually contribute to/ trigger  the other two (asthma and post nasal drip syndrome)  and perpetuate the cylce of cough.  While not intuitively obvious, many patients with chronic low grade reflux do not cough until there is a primary insult that disturbs the protective epithelial barrier and exposes sensitive nerve endings.   This is typically viral but can due to PNDS and  either may apply here.     >>> The point is that once this occurs, it is difficult to eliminate the cycle  using anything but a maximally effective acid suppression regimen at least in the short run, accompanied by an appropriate diet to address non acid GERD and control / eliminate the cough itself for at least 3 days with tessalon around the clock then use it prn and  Also added 6 days of Prednisone in case of component of Th-2 driven upper or lower airways inflammation (if cough responds short term only to relapse befor return while will on rx for uacs that would point to allergic rhinitis/ asthma or eos bronchitis)    >>> f/u in 4 weeks with ENT eval review w/a    Total time devoted to counseling  > 50 % of initial 60 min office visit:  review case with pt/ discussion of options/alternatives/ personally creating written customized instructions  in presence of pt  then going over  those specific  Instructions directly with the pt including how to use all of the meds but in particular covering each new medication in detail and the difference between the maintenance= "automatic" meds and the prns using an action plan format for the latter (If this problem/symptom => do that organization reading Left to right).  Please see AVS from  this visit for a full list of these instructions which I personally wrote for this pt and  are unique to this visit.

## 2019-02-04 ENCOUNTER — Other Ambulatory Visit: Payer: Self-pay | Admitting: Internal Medicine

## 2019-02-04 DIAGNOSIS — Z853 Personal history of malignant neoplasm of breast: Secondary | ICD-10-CM

## 2019-02-04 DIAGNOSIS — Z1231 Encounter for screening mammogram for malignant neoplasm of breast: Secondary | ICD-10-CM

## 2019-02-14 ENCOUNTER — Encounter: Payer: Self-pay | Admitting: Speech Pathology

## 2019-02-14 ENCOUNTER — Other Ambulatory Visit: Payer: Self-pay

## 2019-02-14 ENCOUNTER — Ambulatory Visit: Payer: Medicare Other | Attending: Nurse Practitioner | Admitting: Speech Pathology

## 2019-02-14 ENCOUNTER — Telehealth: Payer: Self-pay

## 2019-02-14 DIAGNOSIS — R1310 Dysphagia, unspecified: Secondary | ICD-10-CM | POA: Diagnosis present

## 2019-02-14 DIAGNOSIS — R498 Other voice and resonance disorders: Secondary | ICD-10-CM | POA: Insufficient documentation

## 2019-02-14 NOTE — Patient Instructions (Signed)
  [email or web address]    ABDOMINAL BREATHING FOR VCD   . Shoulders down - this is a cue to relax . Place your hand on your abdomen - this helps you focus on easy abdominal breath support - the best and most relaxed way to breathe . Breathe in through your nose and fill your belly with air, watching your hand move outward . Breathe out through your mouth and watch your belly move in. An audible "sh"  may help   Think of your belly as a balloon, when you fill with air (inhale), the balloon gets bigger. As the air goes out (exhale), the balloon deflates.  If you are having difficulty coordinating this, lay on your back with a plastic cup on your belly and repeat the above steps, watching you belly move up with inhalation and down with exhalations  Practice breathing in and out in front of a mirror, watching your belly Breathe in for a count of 5 and breathe out for a count of 5  Now as you breathe out, get a picture of relaxing in your mind Feel the constant in-out of your breathing with your belly Picture the tension in your throat and chest evaporate like steam, melting away and FEEL it do so Picture your throat opening up so wide that a grapefruit or softball could fit through your throat.   Practice this throughout the day when you are not having symptoms. For example: in the car, when watching TV, before medications. Regular practice when you are feeling well is important.  Make it automatic and use it at the first sense of throat tightness to prevent or suppress the VCD. You may start with the inhale or exhale.    Be patient when completing the breathing. It may take several minutes to start feeling relief  Use the breathing to "pre-treat" yourself before a known trigger for VCD. Possible triggers could be: change in air temperature, strong odors, perfume, and exercise.   There's an App for that: [email or web address]  I will also email you some handouts I  will scan into your email.  Provided by: Georgiann Hahn. SLP [phone]

## 2019-02-14 NOTE — Telephone Encounter (Signed)
LVM for pt and or pt son to call the office to receive the message below from [facility], [place], PA-C  [place], [place], [place]        Please call her son and tell him, I was supposed to review the results the week after I saw her with him, but I still have not see them.  The cell count, chemistry, thyroid test and culture were negative. Her DM test is stable, was 6.8 now is 6.9. Her bad cholesterol LDL is a little up at 132 and for a diabetic needs to be 70 or less to prevent cardiac event. I dont see a cholesterol medication in her list, so I would recommend placing her on one if OK with her.

## 2019-02-14 NOTE — Therapy (Addendum)
[facility] [address] [place], [place], [address] Phone: [phone]   Fax:  [phone]  Speech Language Pathology Evaluation  Patient Details  Name: Jasmine Lopez MRN: [id number] Date of Birth: [birth year] Referring Provider (SLP): Dr. Christinia Gully   Encounter Date: 02/14/2019  End of Session - 02/14/19 1211    Visit Number  1    Number of Visits  17    Date for SLP Re-Evaluation  04/11/19    SLP Start Time  1059    SLP Stop Time   1138    SLP Time Calculation (min)  39 min    Activity Tolerance  Patient tolerated treatment well       Past Medical History:  Diagnosis Date  . Diabetes mellitus without complication ([place])   . Hypertension   . Irregular heart beat     Past Surgical History:  Procedure Laterality Date  . BREAST EXCISIONAL BIOPSY Bilateral    Speech Therapy Telehealth Visit:  I connected with Lucile Coch today at 11:00y Webex video conference and verified that I am speaking with the correct person using two identifiers.  I discussed the limitations, risks, security and privacy concerns of performing an evaluation and management service by Webex and the availability of in person appointments.  I also discussed with the patient that there may be a patient responsible charge related to this service. The patient expressed understanding and agreed to proceed.    The patient's address was confirmed.  Identified to the patient that therapist is a licensed Electrical engineer in the state of [place].  Verified phone # as [phone] to call in case of technical difficulties.       There were no vitals filed for this visit.  Subjective Assessment - 02/14/19 1155    Subjective  "My son pointed out when I coughed  - I didn't even realize it"    Patient is accompained by:  Family member   daughter present at end of eval   Currently in Pain?  No/denies         SLP Evaluation [facility] -  02/14/19 1155      SLP Visit Information   SLP Received On  02/14/19    Referring Provider (SLP)  Dr. Christinia Gully    Onset Date  2018    Medical Diagnosis  Chronic Cough      Subjective   Patient/Family Stated Goal  "To get this cough under control"      General Information   HPI  Ms. Castellani has h/o cough for over 2 years. She has been evaluated by GI, ENT, PCP without relief. She is referred to [place] by pulmonolgy. MBSS 01/02/19 WNL oropharyngeal swallow.    Mobility Status  walks independently      Balance Screen   Has the patient fallen in the past 6 months  No    Has the patient had a decrease in activity level because of a fear of falling?   No    Is the patient reluctant to leave their home because of a fear of falling?   No      Prior Functional Status   Cognitive/Linguistic Baseline  Within functional limits    Type of Home  House     Lives With  Alone    Available Support  Family    Vocation  Retired      Cognition   Overall Cognitive Status  Within Functional Limits for tasks assessed  Auditory Comprehension   Overall Auditory Comprehension  Appears within functional limits for tasks assessed      Verbal Expression   Overall Verbal Expression  Appears within functional limits for tasks assessed      Oral Motor/Sensory Function   Overall Oral Motor/Sensory Function  Appears within functional limits for tasks assessed      Motor Speech   Overall Motor Speech  Impaired    Respiration  Impaired    Level of Impairment  Sentence    Phonation  Hoarse    Resonance  Within functional limits    Articulation  Within functional limitis    Intelligibility  Intelligible    Motor Planning  Witnin functional limits    Phonation  Impaired    Vocal Abuses  Habitual Cough/Throat Clear    Tension Present  Neck;Shoulder    Volume  Appropriate    Pitch  Appropriate                      SLP Education - 02/14/19 1209    Education Details  practice abdominal  sniff/blow with shoulders down - follow you tube video link for abdominal breathing practice; cough/throat clear alternatives, VCD handouts    Person(s) Educated  Patient;Child(ren)    Methods  Explanation;Demonstration;Verbal cues;Handout;Other (comment)   visual cues   Comprehension  Verbalized understanding;Returned demonstration;Verbal cues required       SLP Short Term Goals - 02/14/19 1225      SLP SHORT TERM GOAL #1   Title  Pt will converse for 8 minutes using abdominal breathing with occasional min A over 2 sessions    Time  4    Period  Weeks    Status  New      SLP SHORT TERM GOAL #2   Title  Pt will report successful achievement of relaxation techniques 2x outside of therpay sessions over 3 sessions    Time  4    Period  Weeks    Status  New      SLP SHORT TERM GOAL #3   Title  Pt will report decreased frequency of VCD symptoms outside of therapy session by 25% subjectively       SLP Long Term Goals - 02/14/19 1227      SLP LONG TERM GOAL #1   Title  Pt will demonstrate or report use of abdominal breathing with sniff/blow to reduce VCD episode with occasional min A    Time  8    Period  Weeks    Status  New      SLP LONG TERM GOAL #2   Title  Pt will converse for 12 minutes with abdominal breathing with rare min A    Time  8    Period  Weeks    Status  New      SLP LONG TERM GOAL #3   Title  Pt will report decreased frequency of VCD symptoms outside of therapy room by 50%    Time  8    Period  Weeks    Status  New       Plan - 02/14/19 1220    Clinical Impression Statement  Ms. Saragoza presents today with c/o moderate to severe chronic cough/vocal cord dysfunction. She scored a 36/40 on the Cough Severity Index, rating all items a 3-4/4. She reports coughing throughout the day. She states that she has to leave restaurants and stores until cough ceases. She feels emabrassed when coughing in  public. Her children become frustrated with her cough at times.  Ms.  adayla radebaugh that she avoids places and her social life is restricted due to her cough. Ms. Allaway identified 3 triggers for her cough: car deoderizers, lysol and smoke. She has been on predisone as prescribed by pulmonologist and she does feel her coughing is reduced. Ms. Bingle says she can't control her cough, resulting in increased anxiety during couging episdes. Occasionally she can feel a tickle in the back of her throat prior to cougining episode. Ms. Janik also shares concern re: her "gravelly" voice. Educated pt that chronic cough can irritate and inflame the vocal folds and that reducing couging may elimiate hoarseness. Today, I introduced abdominal breathing with pt. She required usual mod A to complete abdominal vs thoracic respiration. She also required mod A for sniff/blow. I recommend skilled ST to reduce chronic cough episodes and duration of episodes for improved QOL.    Speech Therapy Frequency  2x / week    Duration  --   8 weeks or 17 visits   Treatment/Interventions  SLP instruction and feedback;Compensatory strategies;Functional tasks;Compensatory techniques;Internal/external aids;Patient/family education;Other (comment)    Potential to Achieve Goals  Good    Potential Considerations  Severity of impairments;Other (comment)   duration of cough   Consulted and Agree with Plan of Care  Patient       Patient will benefit from skilled therapeutic intervention in order to improve the following deficits and impairments:   Other voice and resonance disorders    Problem List Patient Active Problem List   Diagnosis Date Noted  . Upper airway cough syndrome 02/02/2019  . History of ductal carcinoma in situ (DCIS) of breast 12/28/2018  . Pain of left breast 12/28/2018  . Mixed hyperlipidemia 10/28/2018  . Trigger finger, right middle finger 10/14/2018  . Dry mouth, unspecified 10/14/2018  . Allergic 07/15/2018  . Hoarseness of voice 07/15/2018  . Umbilical hernia 99991111  .  Diastolic dysfunction 99991111  . Depression 11/07/2017  . Heart murmur 11/07/2017  . Obesity (BMI 30-39.9) 08/24/2016  . Type 2 diabetes mellitus with hyperlipidemia ([place]) 05/01/2016  . Colon polyps 07/17/2015  . Vitamin D deficiency 06/12/2015  . Ductal carcinoma in situ (DCIS) of left breast 02/15/2015  . Papilloma of right breast 02/15/2015  . Idiopathic chronic gout 11/23/2014  . Non-insulin dependent type 2 diabetes mellitus ([place]) 10/13/2013  . Internal derangement of knee 12/29/2012  . Osteoarthrosis involving lower leg 12/29/2012  . Osteopenia 03/15/2012  . Essential hypertension 06/20/2011  . Cough 06/20/2011    Kody Brandl, Annye Rusk MS, CCC-SLP 02/14/2019, 12:30 PM  [facility] [address] [place], [place], [address] Phone: [phone]   Fax:  [phone]  Name: TAIMANE RIBEIRO MRN: [id number] Date of Birth: [birth year]

## 2019-02-15 ENCOUNTER — Encounter: Payer: Self-pay | Admitting: Cardiology

## 2019-02-15 ENCOUNTER — Ambulatory Visit: Payer: Medicare Other | Admitting: Cardiology

## 2019-02-15 ENCOUNTER — Telehealth: Payer: Self-pay

## 2019-02-15 VITALS — BP 143/74 | HR 65 | Temp 98.4°F | Ht 64.0 in | Wt 196.0 lb

## 2019-02-15 DIAGNOSIS — E119 Type 2 diabetes mellitus without complications: Secondary | ICD-10-CM | POA: Diagnosis not present

## 2019-02-15 DIAGNOSIS — R011 Cardiac murmur, unspecified: Secondary | ICD-10-CM

## 2019-02-15 DIAGNOSIS — I5189 Other ill-defined heart diseases: Secondary | ICD-10-CM

## 2019-02-15 DIAGNOSIS — R05 Cough: Secondary | ICD-10-CM

## 2019-02-15 DIAGNOSIS — I10 Essential (primary) hypertension: Secondary | ICD-10-CM

## 2019-02-15 DIAGNOSIS — R059 Cough, unspecified: Secondary | ICD-10-CM

## 2019-02-15 NOTE — Assessment & Plan Note (Signed)
Chronic- work up in progress- she has seen ENT, she is to have an endoscopy

## 2019-02-15 NOTE — Assessment & Plan Note (Signed)
Benign outflow tract murmur secondary to hyperdynamic LVF-EF 70-75%

## 2019-02-15 NOTE — Progress Notes (Signed)
Cardiology Office Note:    Date:  02/15/2019   ID:  [name], [name] 11/05/1940, MRN [id number]  PCP:  Shelby Mattocks, PA-C  Cardiologist:  Skeet Latch, MD  Electrophysiologist:  None   Referring MD: Rodriguez-Southworth, S*   No chief complaint on file. Referred by her PCP  History of Present Illness:    Jasmine Lopez is a 78 y.o. female with a hx of HTN, NIDDM, HLD, and chronic cough.  Echo done in July 2019 showed normal LVF, no significant valvular heart disease, and grade 2 DD. Her last OV with cardiology was March 2020.    She is in the office today for f/u of her HTN.  The patient is in the process of an evaluation for chronic cough.  She just finished a steroid dose pack.  She notes her B/P has been running higher than usual.  She also admitted to me that she was not taking her Coreg twice a day, just once a day because she felt she was on too many medications and it was the only medication she had to take in the pm.    Past Medical History:  Diagnosis Date  . Diabetes mellitus without complication ([place])   . Hypertension   . Irregular heart beat     Past Surgical History:  Procedure Laterality Date  . BREAST EXCISIONAL BIOPSY Bilateral     Current Medications: Current Meds  Medication Sig  . acetaminophen (TYLENOL) 500 MG tablet Take 500 mg by mouth every 6 (six) hours as needed.  Marland Kitchen allopurinol (ZYLOPRIM) 100 MG tablet TAKE 1 TABLET BY MOUTH EVERY DAY  . amLODipine (NORVASC) 5 MG tablet Take 1 tablet by mouth daily.  Marland Kitchen aspirin 325 MG EC tablet Take 325 mg by mouth every 4 (four) hours as needed for pain.  . benzonatate (TESSALON) 200 MG capsule Take 1 capsule (200 mg total) by mouth 3 (three) times daily as needed for cough.  . Biotin 10000 MCG TBDP 1 qd  . blood glucose meter kit and supplies KIT Dispense based on patient and insurance preference. Use up to two times daily as directed. (FOR ICD-9 250.00, 250.01).  Marland Kitchen buPROPion (WELLBUTRIN  XL) 300 MG 24 hr tablet TAKE ONE TABLET (300 MG DOSE) BY MOUTH DAILY.  . carvedilol (COREG) 6.25 MG tablet TAKE 1 TABLET BY MOUTH TWICE A DAY  . Cholecalciferol (VITAMIN D3) 2000 units TABS Take by mouth.  . famotidine (PEPCID) 20 MG tablet One after supper  . glucose blood (ONE TOUCH ULTRA TEST) test strip TEST FASTING BLOOD SUGAR AND AND 2 HOURS AFTER MEALS  . hydrochlorothiazide (HYDRODIURIL) 25 MG tablet Take 1 tablet (25 mg total) by mouth daily.  Marland Kitchen loratadine (CLARITIN) 10 MG tablet TAKE 1 TABLET BY MOUTH EVERY DAY  . losartan (COZAAR) 100 MG tablet Take 1 tablet (100 mg total) by mouth daily.  . metFORMIN (GLUCOPHAGE) 500 MG tablet Take 500 mg by mouth daily.  Glory Rosebush DELICA LANCETS 37J MISC TEST GLUCOSE DAILY FASTING AND 2 HOURS AFTER MEALS  . pantoprazole (PROTONIX) 40 MG tablet Take 1 tablet (40 mg total) by mouth daily. Take 30-60 min before first meal of the day     Allergies:   Ace inhibitors and Benadryl [diphenhydramine]   Social History   Socioeconomic History  . Marital status: Widowed    Spouse name: Not on file  . Number of children: Not on file  . Years of education: Not on file  . Highest education  level: Not on file  Occupational History  . Occupation: retired  Scientific laboratory technician  . Financial resource strain: Not hard at all  . Food insecurity    Worry: Never true    Inability: Never true  . Transportation needs    Medical: No    Non-medical: No  Tobacco Use  . Smoking status: Never Smoker  . Smokeless tobacco: Never Used  Substance and Sexual Activity  . Alcohol use: Never    Frequency: Never  . Drug use: Never  . Sexual activity: Not Currently  Lifestyle  . Physical activity    Days per week: 2 days    Minutes per session: 30 min  . Stress: Not at all  Relationships  . Social Herbalist on phone: Not on file    Gets together: Not on file    Attends religious service: Not on file    Active member of club or organization: Not on file     Attends meetings of clubs or organizations: Not on file    Relationship status: Not on file  Other Topics Concern  . Not on file  Social History Narrative  . Not on file     Family History: The patient's family history includes COPD in her mother; Cancer in her father; Heart attack in her maternal grandfather; Heart failure in her mother; Rectal cancer in her maternal grandmother.  ROS:   Please see the history of present illness.     All other systems reviewed and are negative.  EKGs/Labs/Other Studies Reviewed:    The following studies were reviewed today: Echo 11/18/2018  Recent Labs: 01/20/2019: ALT 14; BUN 16; Creatinine, Ser 0.98; Hemoglobin 13.4; Platelets 238; Potassium 4.4; Sodium 141; TSH 1.490  Recent Lipid Panel    Component Value Date/Time   CHOL 194 01/20/2019 1255   TRIG 84 01/20/2019 1255   HDL 47 01/20/2019 1255   CHOLHDL 4.1 01/20/2019 1255   LDLCALC 132 (H) 01/20/2019 1255    Physical Exam:    VS:  BP (!) 143/74   Pulse 65   Temp 98.4 F (36.9 C)   Ht _0  (1.626 m)   Wt 196 lb (88.9 kg)   SpO2 96%   BMI 33.64 kg/m     Wt Readings from Last 3 Encounters:  02/15/19 196 lb (88.9 kg)  02/02/19 197 lb 12.8 oz (89.7 kg)  01/20/19 194 lb 12.8 oz (88.4 kg)     GEN:  Well nourished, well developed in no acute distress HEENT: Normal NECK: No JVD; No carotid bruits LYMPHATICS: No lymphadenopathy CARDIAC: RRR 1-2 systolic murmur LSB, no murmurs, rubs, gallops RESPIRATORY:  Clear to auscultation without rales, wheezing or rhonchi  ABDOMEN: Soft, non-tender, non-distended MUSCULOSKELETAL:  No edema; No deformity  SKIN: Warm and dry NEUROLOGIC:  Alert and oriented x 3 PSYCHIATRIC:  Normal affect   ASSESSMENT:    Cough Chronic- work up in progress- she has seen ENT, she is to have an endoscopy  Essential hypertension Under good control on her past regimin- Coreg BID, Losartan, HCTZ, and Norvasc. Recent increase secondary to steroid dose pack and  non compliance with BID Coreg dosing  Diastolic dysfunction Grade 2 DD on echo July 2019- no symptoms or findings to suggest CHF  Non-insulin dependent type 2 diabetes mellitus ([place]) On oral agents  Heart murmur Benign outflow tract murmur secondary to hyperdynamic LVF-EF 70-75%  PLAN:    Increase Coreg back to scheduled dose of 6.25 mg BID.  I think her B/P will come back down now that she has completed her dose pack.  Keep f/u with Dr Oval Linsey in Nov as scheduled.    Medication Adjustments/Labs and Tests Ordered: Current medicines are reviewed at length with the patient today.  Concerns regarding medicines are outlined above.  No orders of the defined types were placed in this encounter.  No orders of the defined types were placed in this encounter.   Patient Instructions  Medication Instructions:  TAKE COREG TWICE A DAY  If you need a refill on your cardiac medications before your next appointment, please call your pharmacy.   Lab work: NONE  If you have labs (blood work) drawn today and your tests are completely normal, you will receive your results only by: Marland Kitchen MyChart Message (if you have MyChart) OR . A paper copy in the mail If you have any lab test that is abnormal or we need to change your treatment, we will call you to review the results.  Testing/Procedures: NONE   Follow-Up: At [facility], you and your health needs are our priority.  As part of our continuing mission to provide you with exceptional heart care, we have created designated Provider Care Teams.  These Care Teams include your primary Cardiologist (physician) and Advanced Practice Providers (APPs -  Physician Assistants and Nurse Practitioners) who all work together to provide you with the care you need, when you need it.  . FOLLOW UP WITH DR Yale AS SCHEDULED  Any Other Special Instructions Will Be Listed Below (If Applicable).      Signed, Kerin Ransom, PA-C  02/15/2019 4:01 PM    [facility]

## 2019-02-15 NOTE — Assessment & Plan Note (Signed)
Under good control on her past regimin- Coreg BID, Losartan, HCTZ, and Norvasc. Recent increase secondary to steroid dose pack and non compliance with BID Coreg dosing

## 2019-02-15 NOTE — Assessment & Plan Note (Signed)
Grade 2 DD on echo July 2019- no symptoms or findings to suggest CHF

## 2019-02-15 NOTE — Telephone Encounter (Signed)
Pt returned my call today, gave her Sunday Spillers message down below. Pt did not wish to start any other meds at this time, she would like to work on her cholesterol and then speak with Sunday Spillers about it at her next appt.  Pt also stated she did not complete all of her blood work, the allergen test was not done pt stated she will be in tomorrow to have test done  Rodriguez-Southworth, Sunday Spillers, Iantha Fallen T, CMA        Please call her son and tell him, I was supposed to review the results the week after I saw her with him, but I still have not see them.  The cell count, chemistry, thyroid test and culture were negative. Her DM test is stable, was 6.8 now is 6.9. Her bad cholesterol LDL is a little up at 132 and for a diabetic needs to be 70 or less to prevent cardiac event. I dont see a cholesterol medication in her list, so I would recommend placing her on one if OK with her.

## 2019-02-15 NOTE — Patient Instructions (Signed)
Medication Instructions:  TAKE COREG TWICE A DAY  If you need a refill on your cardiac medications before your next appointment, please call your pharmacy.   Lab work: NONE  If you have labs (blood work) drawn today and your tests are completely normal, you will receive your results only by: Marland Kitchen MyChart Message (if you have MyChart) OR . A paper copy in the mail If you have any lab test that is abnormal or we need to change your treatment, we will call you to review the results.  Testing/Procedures: NONE   Follow-Up: At [facility], you and your health needs are our priority.  As part of our continuing mission to provide you with exceptional heart care, we have created designated Provider Care Teams.  These Care Teams include your primary Cardiologist (physician) and Advanced Practice Providers (APPs -  Physician Assistants and Nurse Practitioners) who all work together to provide you with the care you need, when you need it.  . FOLLOW UP WITH DR [place] AS SCHEDULED  Any Other Special Instructions Will Be Listed Below (If Applicable).

## 2019-02-15 NOTE — Assessment & Plan Note (Signed)
>>  ASSESSMENT AND PLAN FOR NON-INSULIN DEPENDENT TYPE 2 DIABETES MELLITUS ([facility]) WRITTEN ON 02/15/2019  4:01 PM BY KILROY, LUKE K, PA-C  On oral agents

## 2019-02-15 NOTE — Assessment & Plan Note (Signed)
On oral agents 

## 2019-02-16 ENCOUNTER — Other Ambulatory Visit: Payer: Medicare Other

## 2019-02-17 ENCOUNTER — Telehealth: Payer: Self-pay

## 2019-02-17 NOTE — Telephone Encounter (Signed)
LVM for pt to call the office or stop by today for more lab work

## 2019-02-21 ENCOUNTER — Other Ambulatory Visit: Payer: Self-pay

## 2019-02-21 ENCOUNTER — Encounter: Payer: Self-pay | Admitting: Speech Pathology

## 2019-02-21 ENCOUNTER — Ambulatory Visit: Payer: Medicare Other | Admitting: Speech Pathology

## 2019-02-21 DIAGNOSIS — R498 Other voice and resonance disorders: Secondary | ICD-10-CM

## 2019-02-21 NOTE — Patient Instructions (Signed)
  Good seeing you today, Jasmine Lopez!  You have done a great job practicing your belly sniff and blow - I'm impressed - you even tried it during a coughing spell!  We are adding relaxation to your abdominal breathing. After you practice sniff blow with your belly, add some relaxation, reducing tension in your neck, throat and shoulders - picture the beach  Practice this so it becomes natural and you don't have to think, just go into the breathing/relaxation when you sense a cough coming - or before you go somewhere that you know may cause coughing. For example the detergent aisle or your sons car - start the sniff blow a few minutes before you encounter those places.  Read aloud slowly, taking a belly sniff at the periods - this will feel awkward at first - the goal is to have you talking with this type of breath.  Be aware of your voice "fry" and any tension in your throat - this means you need to breathe - don't try to push words out with your throat when you are running out of air - just breathe  Re-read the handouts every once in a while to be refreshed about what we have done in therapy

## 2019-02-22 NOTE — Therapy (Signed)
[name] [address] [place], [place], [address] Phone: [phone]   Fax:  [phone]  Speech Language Pathology Treatment  Patient Details  Name: Jasmine Lopez MRN: [id number] Date of Birth: 05-Jun-1940 Referring Provider (SLP): Dr. Christinia Gully   Encounter Date: 02/21/2019  End of Session - 02/22/19 0959    Visit Number  2    Number of Visits  17    Date for SLP Re-Evaluation  04/11/19       Past Medical History:  Diagnosis Date  . Diabetes mellitus without complication ([place])   . Hypertension   . Irregular heart beat     Past Surgical History:  Procedure Laterality Date  . BREAST EXCISIONAL BIOPSY Bilateral     There were no vitals filed for this visit.  Subjective Assessment - 02/22/19 0949    Subjective  "I have been practicing the belly breathing - it is easier laying down - I used a cup"        Speech Therapy Telehealth Visit:  I connected with Jasmine Lopez today at 13:20 by [employer] video conference and verified that I am speaking with the correct person using two identifiers.  I discussed the limitations, risks, security and privacy concerns of performing an evaluation and management service by Webex and the availability of in person appointments.  I also discussed with the patient that there may be a patient responsible charge related to this service. The patient expressed understanding and agreed to proceed.    The patient's address was confirmed.  Identified to the patient that therapist is a licensed Psychologist, clinical in the state of [place].  Verified phone # as [phone] to call in case of technical difficulties.        ADULT SLP TREATMENT - 02/22/19 0949      General Information   Behavior/Cognition  Alert;Cooperative;Pleasant mood      Treatment Provided   Treatment provided  Cognitive-Linquistic      Cognitive-Linquistic Treatment   Treatment focused on   Voice;Patient/family/caregiver education    Skilled Treatment  Pt with excellent compliance practicing abdominal breathing with sniff/blow. She reported 1 couging episode in her son's car - he uses air freshener, but takes it out when [place] enters his car. Jasmine Lopez reported she did attempt to use abdmonimal sniff and blow during this episode. She did state this helped reduce the duration of the coughing epidose. Today we added relaxation strategies to her breathing practice. Guided pt through relaxing her neck, shoulders and throat and using a beach image. Pt to practice this at home 3x a day. Instructed to start her abdominal sniff blow before she knows her cough will be triggered. For example start the breathing 5 minutes before she walks down the detergent aisle, or 15 minutes before she gets in her son's car, to pre-treat her cough. In structed speech task at sentence level, trained pt to use abdominal breathing with usual mod A.      Assessment / Recommendations / Plan   Plan  Continue with current plan of care      Progression Toward Goals   Progression toward goals  Progressing toward goals       SLP Education - 02/22/19 0956    Education Details  add relaxation to breathing practice, pre-treat cough when you are entering situations that trigger the cough,when you hear vocal fry - take a breath   handout emailed to pt   Person(s) Educated  Patient  Methods  Explanation;Demonstration;Verbal cues;Handout    Comprehension  Verbalized understanding;Returned demonstration;Verbal cues required;Need further instruction       SLP Short Term Goals - 02/22/19 0958      SLP SHORT TERM GOAL #1   Title  Pt will converse for 8 minutes using abdominal breathing with occasional min A over 2 sessions    Time  4    Period  Weeks    Status  On-going      SLP SHORT TERM GOAL #2   Title  Pt will report successful achievement of relaxation techniques 2x outside of therpay sessions over 3  sessions    Time  4    Period  Weeks    Status  On-going      SLP SHORT TERM GOAL #3   Title  Pt will report decreased frequency of VCD symptoms outside of therapy session by 25% subjectively    Status  On-going       SLP Long Term Goals - 02/22/19 0958      SLP LONG TERM GOAL #1   Title  Pt will demonstrate or report use of abdominal breathing with sniff/blow to reduce VCD episode with occasional min A    Time  8    Period  Weeks    Status  On-going      SLP LONG TERM GOAL #2   Title  Pt will converse for 12 minutes with abdominal breathing with rare min A    Time  8    Period  Weeks    Status  On-going      SLP LONG TERM GOAL #3   Title  Pt will report decreased frequency of VCD symptoms outside of therapy room by 50%    Time  8    Period  Weeks    Status  On-going       Plan - 02/22/19 [id number]    Clinical Impression Statement  Jasmine Lopez presents today with c/o moderate to severe chronic cough/vocal cord dysfunction. She scored a 36/40 on the Cough Severity Index, rating all items a 3-4/4. She reports coughing throughout the day. She states that she has to leave restaurants and stores until cough ceases. She feels emabrassed when coughing in public. Her children become frustrated with her cough at times.  [name] that she avoids places and her social life is restricted due to her cough. Jasmine Lopez identified 3 triggers for her cough: car deoderizers, lysol and smoke. She has been on predisone as prescribed by pulmonologist and she does feel her coughing is reduced. Jasmine Lopez says she can't control her cough, resulting in increased anxiety during couging episdes. Occasionally she can feel a tickle in the back of her throat prior to cougining episode. Jasmine Lopez also shares concern re: her "gravelly" voice. Educated pt that chronic cough can irritate and inflame the vocal folds and that reducing couging may elimiate hoarseness. Today, I introduced abdominal breathing with pt. She  required usual mod A to complete abdominal vs thoracic respiration. She also required mod A for sniff/blow. I recommend skilled ST to reduce chronic cough episodes and duration of episodes for improved QOL.       Patient will benefit from skilled therapeutic intervention in order to improve the following deficits and impairments:   Other voice and resonance disorders    Problem List Patient Active Problem List   Diagnosis Date Noted  . Upper airway cough syndrome 02/02/2019  . History of ductal carcinoma  in situ (DCIS) of breast 12/28/2018  . Pain of left breast 12/28/2018  . Mixed hyperlipidemia 10/28/2018  . Trigger finger, right middle finger 10/14/2018  . Dry mouth, unspecified 10/14/2018  . Allergic 07/15/2018  . Hoarseness of voice 07/15/2018  . Umbilical hernia 99991111  . Diastolic dysfunction 99991111  . Depression 11/07/2017  . Heart murmur 11/07/2017  . Obesity (BMI 30-39.9) 08/24/2016  . Type 2 diabetes mellitus with hyperlipidemia ([facility] [facility]) 05/01/2016  . Colon polyps 07/17/2015  . Vitamin D deficiency 06/12/2015  . Ductal carcinoma in situ (DCIS) of left breast 02/15/2015  . Papilloma of right breast 02/15/2015  . Idiopathic chronic gout 11/23/2014  . Non-insulin dependent type 2 diabetes mellitus ([place]) 10/13/2013  . Internal derangement of knee 12/29/2012  . Osteoarthrosis involving lower leg 12/29/2012  . Osteopenia 03/15/2012  . Essential hypertension 06/20/2011  . Cough 06/20/2011    Lovvorn, Annye Rusk MS, CCC-SLP 02/22/2019, 9:59 AM  [facility] [address] [place], [place], [address] Phone: [phone]   Fax:  [phone]   Name: MARRISSA TEZENO MRN: [id number] Date of Birth: July 02, 1940

## 2019-02-23 ENCOUNTER — Other Ambulatory Visit: Payer: Self-pay

## 2019-02-23 ENCOUNTER — Ambulatory Visit: Payer: Medicare Other

## 2019-02-23 DIAGNOSIS — R1310 Dysphagia, unspecified: Secondary | ICD-10-CM

## 2019-02-23 DIAGNOSIS — R498 Other voice and resonance disorders: Secondary | ICD-10-CM | POA: Diagnosis not present

## 2019-02-23 NOTE — Therapy (Signed)
[name] [address] [name], [place], [address] Phone: [phone]   Fax:  [phone]  Speech Language Pathology Treatment  Patient Details  Name: Jasmine Lopez MRN: [id number] Date of Birth: 06/14/1940 Referring Provider (SLP): Dr. Christinia Gully   Encounter Date: 02/23/2019  End of Session - 02/23/19 1228    Visit Number  3    Number of Visits  17    Date for SLP Re-Evaluation  04/11/19    SLP Start Time  34    SLP Stop Time   1015    SLP Time Calculation (min)  40 min    Activity Tolerance  Patient tolerated treatment well       Past Medical History:  Diagnosis Date  . Diabetes mellitus without complication ([place])   . Hypertension   . Irregular heart beat     Past Surgical History:  Procedure Laterality Date  . BREAST EXCISIONAL BIOPSY Bilateral     There were no vitals filed for this visit.  Subjective Assessment - 02/23/19 0943    Subjective  "We went out to eat and I got "throaty" - I practiced my breathing."    Currently in Pain?  No/denies            ADULT SLP TREATMENT - 02/23/19 0945      General Information   Behavior/Cognition  Alert;Cooperative;Pleasant mood      Treatment Provided   Treatment provided  Cognitive-Linquistic      Cognitive-Linquistic Treatment   Treatment focused on  Voice;Patient/family/caregiver education    Skilled Treatment  Pt was seen via Webex at her home. She gave permission for this visit and for her insurance to be charged. Additionally, SLP verified pt indentity for today's visit. Pt "caught" herself starting to cough at a restaurant yesterday, but breathed focusing on abdominal breathing (AB) and drank water and got through it. Question and answer task - pt with 90% AB, with SLP cue for pt to take second breath PRN instead of rush and get all the utterance out on one breath. SLP educated pt about 2 more relaxation images (her picture of the beach,  and picturing her throat widening to the size of a grapefruit while feeling the tension/cough feeling subside/rela). SLP told pt that could have been something else she used yesterday at the restaurant. Pt states coughing has decr'd frequency approx 75% since initiation of ST. And pt with two throat clears today during session - likely due to amount of talking pt was doing bringing on feeling of throat clear/cough necessary. In conversation pt was attempting to use AB however would easily lose track of abdominal movemetn and would compensate by arching her back. Since pt was not successful at practicing - SLP suggested she set an alarm to work on her homework.       Assessment / Recommendations / Plan   Plan  Continue with current plan of care      Progression Toward Goals   Progression toward goals  Progressing toward goals       SLP Education - 02/23/19 1228    Education Details  set alarm for homework may be helpful    Person(s) Educated  Patient    Methods  Explanation   visual cues   Comprehension  Verbalized understanding       SLP Short Term Goals - 02/23/19 1230      SLP SHORT TERM GOAL #1   Title  Pt will converse for  8 minutes using abdominal breathing with occasional min A over 2 sessions    Time  4    Period  Weeks    Status  On-going      SLP SHORT TERM GOAL #2   Title  Pt will report successful achievement of relaxation techniques 2x outside of therpay sessions over 3 sessions    Time  4    Period  Weeks    Status  On-going      SLP SHORT TERM GOAL #3   Title  Pt will report decreased frequency of VCD symptoms outside of therapy session by 25% subjectively    Status  On-going       SLP Long Term Goals - 02/22/19 0958      SLP LONG TERM GOAL #1   Title  Pt will demonstrate or report use of abdominal breathing with sniff/blow to reduce VCD episode with occasional min A    Time  8    Period  Weeks    Status  On-going      SLP LONG TERM GOAL #2   Title  Pt  will converse for 12 minutes with abdominal breathing with rare min A    Time  8    Period  Weeks    Status  On-going      SLP LONG TERM GOAL #3   Title  Pt will report decreased frequency of VCD symptoms outside of therapy room by 50%    Time  8    Period  Weeks    Status  On-going       Plan - 02/23/19 1228    Clinical Impression Statement  [name] presents today with c/o moderate to severe chronic cough/vocal cord dysfunction, but she reports improving. She scored a 36/40 on the Cough Severity Index, rating all items a 3-4/4. She reports her coughing throughout the day has decr'd approx 75% since initiation of ST. She states that she has had to leave restaurants and stores until cough ceases. She feels emabrassed when coughing in public. Her children become frustrated with her cough at times.  [name] that she avoids places and her social life is restricted due to her cough. [name] identified 3 triggers for her cough: car deoderizers, lysol and smoke. She has been on predisone as prescribed by pulmonologist and she does feel her coughing is reduced. [name] says she can't control her cough, resulting in increased anxiety during couging episdes. Occasionally she can feel a tickle in the back of her throat prior to cougining episode. [name] also shares concern re: her "gravelly" voice. Educated pt that chronic cough can irritate and inflame the vocal folds and that reducing couging may elimiate hoarseness. Today, I introduced abdominal breathing with pt. She required usual mod A to complete abdominal vs thoracic respiration. She also required mod A for sniff/blow. I recommend skilled ST to reduce chronic cough episodes and duration of episodes for improved QOL.    Speech Therapy Frequency  2x / week    Duration  --   8 weeks/17 visits   Treatment/Interventions  SLP instruction and feedback;Compensatory strategies;Functional tasks;Compensatory techniques;Internal/external  aids;Patient/family education;Other (comment)    Potential to Achieve Goals  Good    Potential Considerations  Severity of impairments;Other (comment)    Consulted and Agree with Plan of Care  Patient       Patient will benefit from skilled therapeutic intervention in order to improve the following deficits and impairments:  Other voice and resonance disorders  Dysphagia, unspecified type    Problem List Patient Active Problem List   Diagnosis Date Noted  . Upper airway cough syndrome 02/02/2019  . History of ductal carcinoma in situ (DCIS) of breast 12/28/2018  . Pain of left breast 12/28/2018  . Mixed hyperlipidemia 10/28/2018  . Trigger finger, right middle finger 10/14/2018  . Dry mouth, unspecified 10/14/2018  . Allergic 07/15/2018  . Hoarseness of voice 07/15/2018  . Umbilical hernia 99991111  . Diastolic dysfunction 99991111  . Depression 11/07/2017  . Heart murmur 11/07/2017  . Obesity (BMI 30-39.9) 08/24/2016  . Type 2 diabetes mellitus with hyperlipidemia ([facility]) 05/01/2016  . Colon polyps 07/17/2015  . Vitamin D deficiency 06/12/2015  . Ductal carcinoma in situ (DCIS) of left breast 02/15/2015  . Papilloma of right breast 02/15/2015  . Idiopathic chronic gout 11/23/2014  . Non-insulin dependent type 2 diabetes mellitus ([place]) 10/13/2013  . Internal derangement of knee 12/29/2012  . Osteoarthrosis involving lower leg 12/29/2012  . Osteopenia 03/15/2012  . Essential hypertension 06/20/2011  . Cough 06/20/2011    Alfons Sulkowski ,MS, CCC-SLP  02/23/2019, 12:30 PM  [facility] [address] [place], [place], [address] Phone: [phone]   Fax:  [phone]   Name: JONATHA HABICHT MRN: [id number] Date of Birth: [birth year]

## 2019-02-25 ENCOUNTER — Other Ambulatory Visit: Payer: Self-pay | Admitting: Internal Medicine

## 2019-02-25 DIAGNOSIS — R05 Cough: Secondary | ICD-10-CM

## 2019-02-25 DIAGNOSIS — R058 Other specified cough: Secondary | ICD-10-CM

## 2019-02-28 ENCOUNTER — Ambulatory Visit: Payer: Medicare Other | Admitting: Speech Pathology

## 2019-03-02 ENCOUNTER — Ambulatory Visit: Payer: Medicare Other | Admitting: Speech Pathology

## 2019-03-02 ENCOUNTER — Other Ambulatory Visit: Payer: Self-pay

## 2019-03-02 ENCOUNTER — Encounter: Payer: Self-pay | Admitting: Speech Pathology

## 2019-03-02 DIAGNOSIS — R498 Other voice and resonance disorders: Secondary | ICD-10-CM | POA: Diagnosis not present

## 2019-03-02 NOTE — Therapy (Signed)
[facility] [address] [place], [place], [address] Phone: [phone]   Fax:  [phone]  Speech Language Pathology Treatment  Patient Details  Name: Jasmine Lopez MRN: [id number] Date of Birth: 1941/04/05 Referring Provider (SLP): Dr. Christinia Gully   Encounter Date: 03/02/2019  End of Session - 03/02/19 1201    Visit Number  4    Number of Visits  17    Date for SLP Re-Evaluation  04/11/19    SLP Start Time  1025   pt arrived late   SLP Stop Time   1104    SLP Time Calculation (min)  39 min    Activity Tolerance  Patient tolerated treatment well       Past Medical History:  Diagnosis Date  . Diabetes mellitus without complication ([facility])   . Hypertension   . Irregular heart beat     Past Surgical History:  Procedure Laterality Date  . BREAST EXCISIONAL BIOPSY Bilateral     There were no vitals filed for this visit.  Subjective Assessment - 03/02/19 1147    Subjective  "I thought I was home free from the cough, but I see that I am not"    Currently in Pain?  No/denies            ADULT SLP TREATMENT - 03/02/19 1016      General Information   Behavior/Cognition  Alert;Cooperative;Pleasant mood      Cognitive-Linquistic Treatment   Treatment focused on  Voice;Patient/family/caregiver education    Skilled Treatment  Pt reports she has utilized sniff-blow in the grocery store to reduce her cough episode successfully.  She didn't feel it coming on, but once she began coughing, she was able to use sniff blow to control cough. Pt expressed pleasure that she didn't have to leave the store like she usually does, but was able to stand in place and use her breathing. Pt enters room with gravely voice.  Initially she required 4 cues to relax her shoulders, as they would elevate with abdominal breathing in isolation. Pt ID's when she is "doing it wrong" and is able to correct herself with rare min A. Pt  reports she has practiced relaxation at home with her breathing. She demonstrated this today with rare min A. Structured speech task where pt utilized abdominal sniff blow before each sentence generation in picture description task with occasional min verbal cues and modeling. Vocal quality does improve with adequate breath support. Of note, pt cleared her throat 8x this session. We practice throat clear alternatives that will need to be reinforced next session. Pt instructed to put a sign up where she talks on the phone for "Shoulders down" and "sniff blow" to facilitate carry over in conversations at home. She does report a greater than 50% reduction in coughing episodes and duration      Assessment / Recommendations / Plan   Plan  Continue with current plan of care      Progression Toward Goals   Progression toward goals  Progressing toward goals       SLP Education - 03/02/19 1153    Education Details  Practice breathing all day - throughout the day. Add relaxation 3x a day. Reduce throat clears!, Use signs to remind you to sniff/blow with your belly and keep shoulders and neck relaxed    Person(s) Educated  Patient    Methods  Explanation;Demonstration;Handout;Verbal cues    Comprehension  Verbalized understanding;Returned demonstration;Verbal cues required  SLP Short Term Goals - 03/02/19 1159      SLP SHORT TERM GOAL #1   Title  Pt will converse for 8 minutes using abdominal breathing with occasional min A over 2 sessions    Time  3    Period  Weeks    Status  On-going      SLP SHORT TERM GOAL #2   Title  Pt will report successful achievement of relaxation techniques 2x outside of therpay sessions over 3 sessions    Time  3    Period  Weeks    Status  On-going      SLP SHORT TERM GOAL #3   Title  Pt will report decreased frequency of VCD symptoms outside of therapy session by 25% subjectively    Status  Achieved       SLP Long Term Goals - 03/02/19 1200      SLP  LONG TERM GOAL #1   Title  Pt will demonstrate or report use of abdominal breathing with sniff/blow to reduce VCD episode with occasional min A    Time  7    Period  Weeks    Status  On-going      SLP LONG TERM GOAL #2   Title  Pt will converse for 12 minutes with abdominal breathing with rare min A    Time  7    Period  Weeks    Status  On-going      SLP LONG TERM GOAL #3   Title  Pt will report decreased frequency of VCD symptoms outside of therapy room by 75%    Time  8    Period  Weeks    Status  Revised   upgraded from 50% to 75%      Plan - 03/02/19 1155    Clinical Impression Statement  Jasmine Lopez continues to  practice abdominal sniff/blow at home. She noticies when she is breathing wrong (abdoman in on inspiration) and can self correct with min A.  She has had success using this strategy to control her cough in her son's car and at the grocery store. Jasmine Lopez continues to require cues to reduce tension in her shoulders and neck. Frequent throat clears persist, ongoing training for throat clear alternative. Continue skilled ST to reduce chronic cough for improved QOL    Speech Therapy Frequency  2x / week    Duration  --   8 weeks or 17 visits   Treatment/Interventions  SLP instruction and feedback;Compensatory strategies;Functional tasks;Compensatory techniques;Internal/external aids;Patient/family education;Other (comment)    Potential to Achieve Goals  Good       Patient will benefit from skilled therapeutic intervention in order to improve the following deficits and impairments:   Other voice and resonance disorders    Problem List Patient Active Problem List   Diagnosis Date Noted  . Upper airway cough syndrome 02/02/2019  . History of ductal carcinoma in situ (DCIS) of breast 12/28/2018  . Pain of left breast 12/28/2018  . Mixed hyperlipidemia 10/28/2018  . Trigger finger, right middle finger 10/14/2018  . Dry mouth, unspecified 10/14/2018  . Allergic  07/15/2018  . Hoarseness of voice 07/15/2018  . Umbilical hernia 99991111  . Diastolic dysfunction 99991111  . Depression 11/07/2017  . Heart murmur 11/07/2017  . Obesity (BMI 30-39.9) 08/24/2016  . Type 2 diabetes mellitus with hyperlipidemia ([place]) 05/01/2016  . Colon polyps 07/17/2015  . Vitamin D deficiency 06/12/2015  . Ductal carcinoma in situ (DCIS) of left  breast 02/15/2015  . Papilloma of right breast 02/15/2015  . Idiopathic chronic gout 11/23/2014  . Non-insulin dependent type 2 diabetes mellitus ([place]) 10/13/2013  . Internal derangement of knee 12/29/2012  . Osteoarthrosis involving lower leg 12/29/2012  . Osteopenia 03/15/2012  . Essential hypertension 06/20/2011  . Cough 06/20/2011    Lovvorn, Annye Rusk MS, CCC-SLP 03/02/2019, 12:01 PM  [facility] [address] [place], [place], [address] Phone: [phone]   Fax:  [phone]   Name: ADDALYNN SOKOLOV MRN: [id number] Date of Birth: [birth year]

## 2019-03-02 NOTE — Patient Instructions (Signed)
  Review your handouts  Shoulders down!!   Continue to practice sniff/blow all day - when you are driving, watching TV, when it's your turn to listen on the phone  Continue to practice relaxation with your breathing  Read aloud with belly sniff and blow at the periods with relaxed shoulders  Reduce throat clears - take a hard swallow, sip or forceful HUH and swallow instead of throat clearing  Use signs to remind you to sniff blow and shoulders down

## 2019-03-04 ENCOUNTER — Ambulatory Visit: Payer: Medicare Other | Admitting: Internal Medicine

## 2019-03-04 ENCOUNTER — Encounter: Payer: Self-pay | Admitting: Internal Medicine

## 2019-03-04 ENCOUNTER — Other Ambulatory Visit: Payer: Self-pay

## 2019-03-04 ENCOUNTER — Ambulatory Visit (INDEPENDENT_AMBULATORY_CARE_PROVIDER_SITE_OTHER): Payer: Medicare Other

## 2019-03-04 DIAGNOSIS — R05 Cough: Secondary | ICD-10-CM

## 2019-03-04 DIAGNOSIS — R058 Other specified cough: Secondary | ICD-10-CM

## 2019-03-04 NOTE — Progress Notes (Signed)
Jasmine Lopez, female    DOB: Jan 08, 1941,     MRN: [id number]   Brief patient profile:  58 yobf never smoker hay fever as child better p left [place] in her 40s and then started pattern of chronic cough after h1n1 and worse  fall 2018 so referred to pulmonary clinic 02/02/2019      History of Present Illness  02/02/2019  Pulmonary/ 1st office eval/Saleh Ulbrich  Chief Complaint  Patient presents with  . Pulmonary Consult    Self referral. Pt c/o cough x 2 yrs- non prod and occurs when she is exposed to certain smells and occ when she lies down.   cough is daily assoc hoarseness with ent eval 01/16/7892  wolicki  "neg" - no notes in care everywhere Dyspnea:  Can walk up to an hour  Cough: dry hack/ better while asleep and not waking her up / worse with smells certain foods with sense of globus worse immediately when lies down then resolves within  Few min and does not disturb sleep Sleep: sits up for a while immediately then later able to lie flat/ sleep fine SABA use: none  Prednisone helped / mint or water helps rec The key to effective treatment for your cough is eliminating   First take tessalon 200 mg up to every 6 hour until no cough at all x 3 straight days  Prednisone 10 mg take  4 each am x 2 days,   2 each am x 2 days,  1 each am x 2 days and stop (this is to eliminate allergies and inflammation from coughing) Protonix (pantoprazole) Take 30-60 min before first meal of the day and Pepcid 20 mg one bedtime plus chlorpheniramine 4 mg x1 at bedtime GERD (REFLUX)  Please schedule a follow up office visit in 4 weeks, sooner if needed  with all medications /inhalers/ solutions in hand so we can verify exactly what you are taking. This includes all medications from all doctors and over the counters We will do cxr on return    03/04/2019  f/u ov/Bassem Bernasconi re:  cough x 2016  Chief Complaint  Patient presents with  . Follow-up    Cough is 50% better since the last visit.    Dyspnea:  Not  limited by breathing from desired activities   Cough: dry  Very sensitive to temp changes and perfume Sleeping: sleeps fine  SABA use: none  02: none  Has not tried 1st gen H1 blockers per recs     No obvious day to day or daytime variability or assoc excess/ purulent sputum or mucus plugs or hemoptysis or cp or chest tightness, subjective wheeze or overt sinus or hb symptoms.   Sleeping flat  without nocturnal  or early am exacerbation  of respiratory  c/o's or need for noct saba. Also denies any obvious fluctuation of symptoms with weather or environmental changes or other aggravating or alleviating factors except as outlined above   No unusual exposure hx or h/o childhood pna/ asthma or knowledge of premature birth.  Current Allergies, Complete Past Medical History, Past Surgical History, Family History, and Social History were reviewed in [employer] record.  ROS  The following are not active complaints unless bolded Hoarseness, sore throat, dysphagia, dental problems, itching, sneezing,  nasal congestion or discharge of excess mucus or purulent secretions, ear ache,   fever, chills, sweats, unintended wt loss or wt gain, classically pleuritic or exertional cp,  orthopnea pnd or arm/hand swelling  or leg swelling, presyncope, palpitations, abdominal pain, anorexia, nausea, vomiting, diarrhea  or change in bowel habits or change in bladder habits, change in stools or change in urine, dysuria, hematuria,  rash, arthralgias, visual complaints, headache, numbness, weakness or ataxia or problems with walking or coordination,  change in mood or  memory.        Current Meds  Medication Sig  . acetaminophen (TYLENOL) 500 MG tablet Take 500 mg by mouth every 6 (six) hours as needed.  Marland Kitchen allopurinol (ZYLOPRIM) 100 MG tablet TAKE 1 TABLET BY MOUTH EVERY DAY  . amLODipine (NORVASC) 5 MG tablet Take 1 tablet by mouth daily.  Marland Kitchen aspirin 325 MG EC tablet Take 325 mg by mouth every  4 (four) hours as needed for pain.  . benzonatate (TESSALON) 200 MG capsule Take 1 capsule (200 mg total) by mouth 3 (three) times daily as needed for cough.  . Biotin 10000 MCG TBDP 1 qd  . blood glucose meter kit and supplies KIT Dispense based on patient and insurance preference. Use up to two times daily as directed. (FOR ICD-9 250.00, 250.01).  Marland Kitchen buPROPion (WELLBUTRIN XL) 300 MG 24 hr tablet TAKE ONE TABLET (300 MG DOSE) BY MOUTH DAILY.  . carvedilol (COREG) 6.25 MG tablet TAKE 1 TABLET BY MOUTH TWICE A DAY  . Cholecalciferol (VITAMIN D3) 2000 units TABS Take by mouth.  Marland Kitchen Cod Liver Oil 1000 MG CAPS Take 1 capsule by mouth daily.  . famotidine (PEPCID) 20 MG tablet One after supper  . Garlic 3976 MG CAPS Take 1 capsule by mouth daily.  Marland Kitchen glucose blood (ONE TOUCH ULTRA TEST) test strip TEST FASTING BLOOD SUGAR AND AND 2 HOURS AFTER MEALS  . loratadine (CLARITIN) 10 MG tablet TAKE 1 TABLET BY MOUTH EVERY DAY  . metFORMIN (GLUCOPHAGE) 500 MG tablet Take 500 mg by mouth daily.  Glory Rosebush DELICA LANCETS 73A MISC TEST GLUCOSE DAILY FASTING AND 2 HOURS AFTER MEALS  . pantoprazole (PROTONIX) 40 MG tablet Take 1 tablet (40 mg total) by mouth daily. Take 30-60 min before first meal of the day           Past Medical History:  Diagnosis Date  . Diabetes mellitus without complication ([place])   . Hypertension   . Irregular heart beat        Objective:    amb  mod obese bf nad occ throat clearing    Wt Readings from Last 3 Encounters:  03/04/19 196 lb (88.9 kg)  02/15/19 196 lb (88.9 kg)  02/02/19 197 lb 12.8 oz (89.7 kg)     Vital signs reviewed - Note on arrival 02 sats  98% on RA      HEENT : pt wearing mask not removed for exam due to covid -19 concerns.    NECK :  without JVD/Nodes/TM/ nl carotid upstrokes bilaterally   LUNGS: no acc muscle use,  Nl contour chest which is clear to A and P bilaterally without cough on insp or exp maneuvers   CV:  RRR  no s3 or murmur or  increase in P2, and no edema   ABD: obese  soft and nontender with nl inspiratory excursion in the supine position. No bruits or organomegaly appreciated, bowel sounds nl  MS:  Nl gait/ ext warm without deformities, calf tenderness, cyanosis or clubbing No obvious joint restrictions   SKIN: warm and dry without lesions    NEURO:  alert, approp, nl sensorium with  no motor or cerebellar deficits  apparent.           Assessment

## 2019-03-04 NOTE — Progress Notes (Signed)
Left detailed msg with results ok per DPR 

## 2019-03-04 NOTE — Patient Instructions (Addendum)
An hour before bed take pepcid 20 mg and chlorpheniramine 4 mg   For drainage / throat tickle try take CHLORPHENIRAMINE  4 mg  (Chlortab 4mg   at [employer] should be easiest to find in the green box)  take one every 4 hours as needed - available over the counter- may cause drowsiness so start with just a bedtime dose or two and see how you tolerate it before trying in daytime     Please remember to go to the  x-ray department  for your tests - we will call you with the results when they are available     Please schedule a follow up office visit in 6 weeks, call sooner if needed with all medications /inhalers/ solutions in hand so we can verify exactly what you are taking. This includes all medications from all doctors and over the [place] separate them into two bags:  the ones you take automatically, no matter what, vs the ones you take just when you feel you need them "BAG #2 is UP TO YOU"  - this will really help [place] help you take your medications more effectively.

## 2019-03-07 ENCOUNTER — Encounter: Payer: Self-pay | Admitting: Speech Pathology

## 2019-03-07 ENCOUNTER — Encounter: Payer: Self-pay | Admitting: Internal Medicine

## 2019-03-07 ENCOUNTER — Ambulatory Visit: Payer: Medicare Other | Admitting: Speech Pathology

## 2019-03-07 ENCOUNTER — Other Ambulatory Visit: Payer: Self-pay

## 2019-03-07 DIAGNOSIS — R498 Other voice and resonance disorders: Secondary | ICD-10-CM

## 2019-03-07 NOTE — Therapy (Signed)
[facility] [address] [place], [place], [address] Phone: [phone]   Fax:  [phone]  Speech Language Pathology Treatment  Patient Details  Name: Jasmine Lopez MRN: [id number] Date of Birth: [birth year] Referring Provider (SLP): Dr. Christinia Gully   Encounter Date: 03/07/2019  End of Session - 03/07/19 1512    Visit Number  5    Number of Visits  17    Date for SLP Re-Evaluation  04/11/19    SLP Start Time  65    SLP Stop Time   [id number]    SLP Time Calculation (min)  42 min    Activity Tolerance  Patient tolerated treatment well       Past Medical History:  Diagnosis Date  . Diabetes mellitus without complication ([place])   . Hypertension   . Irregular heart beat     Past Surgical History:  Procedure Laterality Date  . BREAST EXCISIONAL BIOPSY Bilateral     There were no vitals filed for this visit.  Subjective Assessment - 03/07/19 1456    Subjective  "I thought it would get worse going from hot to cold in the weather, but it didn't"    Currently in Pain?  No/denies            ADULT SLP TREATMENT - 03/07/19 1243      General Information   Behavior/Cognition  Alert;Cooperative;Pleasant mood      Treatment Provided   Treatment provided  Cognitive-Linquistic      Cognitive-Linquistic Treatment   Treatment focused on  Voice;Patient/family/caregiver education    Skilled Treatment  Pt reports using sniff/blow to reduce duration of VCD episode on several occasions. She also states "I'm starting to sound like my old self" re: her voice. Pt felt a cough coming due to cooking smoke and was able to prevent cough by using sniff blow and going outside to eliminate trigger. Pt had VCD episode in ST. Pt stopped cough with using sniff blow and relaxation with verbal cues from ST. Instructed pt not to talk or use her vocal folds again until she feels the tickle or cough episode has passed. Pt also cued to  sniff/blow after throat clears. Educated pt that throat clear and cough are both part of the same symptom. She cleared her throat 3x during session with out awareness until I pointed it out to her and cued her to cycles of sniff and blow      Assessment / Recommendations / Plan   Plan  Continue with current plan of care      Progression Toward Goals   Progression toward goals  Progressing toward goals       SLP Education - 03/07/19 1509    Education Details  continue sniff blow throughout the day, continue reduce shoulder/neck tension, reduce/elimiate throat clears, sniff/blow to pretreat VCD symptoms    Person(s) Educated  Patient    Methods  Explanation;Demonstration;Verbal cues;Handout    Comprehension  Verbalized understanding;Returned demonstration;Verbal cues required       SLP Short Term Goals - 03/07/19 1511      SLP SHORT TERM GOAL #1   Title  Pt will converse for 8 minutes using abdominal breathing with occasional min A over 2 sessions    Baseline  03/07/19    Time  2    Period  Weeks    Status  On-going      SLP SHORT TERM GOAL #2   Title  Pt will report successful  achievement of relaxation techniques 2x outside of therpay sessions over 3 sessions    Baseline  03/07/19;    Time  2    Period  Weeks    Status  On-going      SLP SHORT TERM GOAL #3   Title  Pt will report decreased frequency of VCD symptoms outside of therapy session by 25% subjectively    Status  Achieved       SLP Long Term Goals - 03/07/19 1512      SLP LONG TERM GOAL #1   Title  Pt will demonstrate or report use of abdominal breathing with sniff/blow to reduce VCD episode with occasional min A    Baseline  03/07/19;    Time  6    Period  Weeks    Status  On-going      SLP LONG TERM GOAL #2   Title  Pt will converse for 12 minutes with abdominal breathing with rare min A    Time  6    Period  Weeks    Status  On-going      SLP LONG TERM GOAL #3   Title  Pt will report decreased  frequency of VCD symptoms outside of therapy room by 75%    Time  6    Period  Weeks    Status  On-going   upgraded from 50% to 75%      Plan - 03/07/19 1510    Clinical Impression Statement  Jasmine Lopez continues to  practice abdominal sniff/blow at home with relaxation  She has had success using this strategy to control her cough when she was exposed to cooking smoke at her daughters house and when she feels a "tickle" . Jasmine Lopez continues to require cues to reduce tension in her shoulders and neck. Frequent throat clears persist, ongoing training for throat clear alternative. Continue skilled ST to reduce chronic cough for improved QOL    Speech Therapy Frequency  2x / week    Duration  --   8 weeks or 17 visits      Patient will benefit from skilled therapeutic intervention in order to improve the following deficits and impairments:   Other voice and resonance disorders    Problem List Patient Active Problem List   Diagnosis Date Noted  . Upper airway cough syndrome 02/02/2019  . History of ductal carcinoma in situ (DCIS) of breast 12/28/2018  . Pain of left breast 12/28/2018  . Mixed hyperlipidemia 10/28/2018  . Trigger finger, right middle finger 10/14/2018  . Dry mouth, unspecified 10/14/2018  . Allergic 07/15/2018  . Hoarseness of voice 07/15/2018  . Umbilical hernia 99991111  . Diastolic dysfunction 99991111  . Depression 11/07/2017  . Heart murmur 11/07/2017  . Obesity (BMI 30-39.9) 08/24/2016  . Type 2 diabetes mellitus with hyperlipidemia ([place]) 05/01/2016  . Colon polyps 07/17/2015  . Vitamin D deficiency 06/12/2015  . Ductal carcinoma in situ (DCIS) of left breast 02/15/2015  . Papilloma of right breast 02/15/2015  . Idiopathic chronic gout 11/23/2014  . Non-insulin dependent type 2 diabetes mellitus ([place]) 10/13/2013  . Internal derangement of knee 12/29/2012  . Osteoarthrosis involving lower leg 12/29/2012  . Osteopenia 03/15/2012  . Essential  hypertension 06/20/2011  . Cough 06/20/2011    Jeffree Cazeau, Annye Rusk MS, CCC-SLP 03/07/2019, 3:13 PM  [facility] [address] [place], [place], [address] Phone: [phone]   Fax:  [phone]   Name: Jasmine Lopez MRN:  [id number] Date of Birth: 1940-08-21

## 2019-03-07 NOTE — Assessment & Plan Note (Signed)
Onset ? p H1N1 but daily since fall 99991111  - cyclical cough rx 123456 with tessalon suppression   Add 1st gen H1 blockers per guidelines    Advised:  The standardized cough guidelines published in Chest by Lissa Morales in 2006 are still the best available and consist of a multiple step process (up to 12!) , not a single office visit,  and are intended  to address this problem logically,  with an alogrithm dependent on response to empiric treatment at  each progressive step  to determine a specific diagnosis with  minimal addtional testing needed. Therefore if adherence is an issue or can't be accurately verified,  it's very unlikely the standard evaluation and treatment will be successful here.    Furthermore, response to therapy (other than acute cough suppression, which should only be used short term with avoidance of narcotic containing cough syrups if possible), can be a gradual process for which the patient is not likely to  perceive immediate benefit.  Unlike going to an eye doctor where the best perscription is almost always the first one and is immediately effective, this is almost never the case in the management of chronic cough syndromes. Therefore the patient needs to commit up front to consistently adhere to recommendations  for up to 6 weeks of therapy directed at the likely underlying problem(s) before the response can be reasonably evaluated.    F/u in 6 weeks with all meds in hand using a trust but verify approach to confirm accurate Medication  Reconciliation The principal here is that until we are certain that the  patients are doing what we've asked, it makes no sense to ask them to do more.

## 2019-03-07 NOTE — Patient Instructions (Signed)
   Use sniff/blow when you feel a tickle come on, to try to prevent cough  When you are having a tickle, throat clear or cough, use sniff blow and relaxation until the sensation has passed - No Talking during this until the sensation has passed  Use sniff/blow all day, even when you are talking, sniff, then talk   You are doing a great job - I know it is work and you have to think about it  Cough and throat clears are the same - they are both part of the vocal cord dysfunction or chronic cough

## 2019-03-09 ENCOUNTER — Ambulatory Visit: Payer: Medicare Other | Admitting: Speech Pathology

## 2019-03-11 ENCOUNTER — Other Ambulatory Visit: Payer: Self-pay

## 2019-03-11 ENCOUNTER — Ambulatory Visit
Admission: RE | Admit: 2019-03-11 | Discharge: 2019-03-11 | Disposition: A | Discharge status: Home / Self Care | Payer: Medicare Other | Source: Ambulatory Visit | Attending: Internal Medicine | Admitting: Internal Medicine

## 2019-03-11 DIAGNOSIS — Z853 Personal history of malignant neoplasm of breast: Secondary | ICD-10-CM

## 2019-03-14 ENCOUNTER — Encounter: Payer: Self-pay | Admitting: Speech Pathology

## 2019-03-14 ENCOUNTER — Other Ambulatory Visit: Payer: Self-pay

## 2019-03-14 ENCOUNTER — Ambulatory Visit: Payer: Medicare Other | Attending: Nurse Practitioner | Admitting: Speech Pathology

## 2019-03-14 DIAGNOSIS — R1310 Dysphagia, unspecified: Secondary | ICD-10-CM | POA: Insufficient documentation

## 2019-03-14 DIAGNOSIS — R498 Other voice and resonance disorders: Secondary | ICD-10-CM | POA: Insufficient documentation

## 2019-03-14 NOTE — Therapy (Signed)
[facility] [address] [place], [place], [address] Phone: [phone]   Fax:  [phone]  Speech Language Pathology Treatment  Patient Details  Name: Jasmine Lopez MRN: [id number] Date of Birth: 1940-09-24 Referring Provider (SLP): Dr. Christinia Gully   Encounter Date: 03/14/2019  End of Session - 03/14/19 1524    Visit Number  6    Number of Visits  17    Date for SLP Re-Evaluation  04/11/19    SLP Start Time  0930    SLP Stop Time   1013    SLP Time Calculation (min)  43 min    Activity Tolerance  Patient tolerated treatment well       Past Medical History:  Diagnosis Date  . Diabetes mellitus without complication ([place])   . Hypertension   . Irregular heart beat     Past Surgical History:  Procedure Laterality Date  . BREAST EXCISIONAL BIOPSY Bilateral     There were no vitals filed for this visit.  Subjective Assessment - 03/14/19 1516    Subjective  "I haven't regressed - I'm still at 60% to 70%    Currently in Pain?  No/denies            ADULT SLP TREATMENT - 03/14/19 1516      General Information   Behavior/Cognition  Alert;Cooperative;Pleasant mood      Cognitive-Linquistic Treatment   Treatment focused on  Voice;Patient/family/caregiver education    Skilled Treatment  Pt reports she has remained at 70% improvement in coughing. She continues to strain her voice with glottal fry when she runs out of air. Targeted used of more frequent abdominal sniff during simple conversation today. I pointed out when she uses appropriate breath support her voice is more clear. Pt conversed with abdominal sniff for 18 minutes without cough or throat clear. Pt reports she has been able to use sniff/blow to reduce duration of coughing episodes. She demonstrated 4 throat clears today - she is unaware when she clears her throat. I suggested she have family members raise their finger when they hear her clear her  throat. Pt instructed to take a sniff when she hears her voice get gravelly or feels tension in her throat due to running out of air in conversation      Assessment / Recommendations / [place] with current plan of care      Progression Toward Goals   Progression toward goals  Progressing toward goals       SLP Education - 03/14/19 1522    Education Details  compensations for VCD       SLP Short Term Goals - 03/14/19 1523      SLP SHORT TERM GOAL #1   Title  Pt will converse for 8 minutes using abdominal breathing with occasional min A over 2 sessions    Baseline  03/07/19; 03/14/19    Time  2    Period  Weeks    Status  Achieved      SLP SHORT TERM GOAL #2   Title  Pt will report successful achievement of relaxation techniques 2x outside of therpay sessions over 3 sessions    Baseline  03/07/19;    Time  2    Period  Weeks    Status  Achieved      SLP SHORT TERM GOAL #3   Title  Pt will report decreased frequency of VCD symptoms outside of therapy session by  25% subjectively    Status  Achieved       SLP Long Term Goals - 03/14/19 1523      SLP LONG TERM GOAL #1   Title  Pt will demonstrate or report use of abdominal breathing with sniff/blow to reduce VCD episode with occasional min A    Baseline  03/07/19; 03/14/19    Time  5    Period  Weeks    Status  On-going      SLP LONG TERM GOAL #2   Title  Pt will converse for 12 minutes with abdominal breathing with rare min A    Time  5    Period  Weeks    Status  On-going      SLP LONG TERM GOAL #3   Title  Pt will report decreased frequency of VCD symptoms outside of therapy room by 75%    Time  5    Period  Weeks    Status  On-going   upgraded from 50% to 75%      Plan - 03/14/19 1522    Clinical Impression Statement  Ms. Applebaum continues to  practice abdominal sniff/blow at home with relaxation  She has had success using this strategy to control her cough when she was exposed to cooking smoke at  her daughters house and when she feels a "tickle" . Ms. Gleim continues to require cues to reduce tension in her shoulders and neck. Frequent throat clears persist, ongoing training for throat clear alternative. Continue skilled ST to reduce chronic cough for improved QOL    Speech Therapy Frequency  2x / week    Duration  --   8 weeks or 17 visits   Treatment/Interventions  SLP instruction and feedback;Compensatory strategies;Functional tasks;Compensatory techniques;Internal/external aids;Patient/family education;Other (comment)    Potential to Achieve Goals  Good    Potential Considerations  Severity of impairments;Other (comment)       Patient will benefit from skilled therapeutic intervention in order to improve the following deficits and impairments:   Other voice and resonance disorders    Problem List Patient Active Problem List   Diagnosis Date Noted  . Upper airway cough syndrome 02/02/2019  . History of ductal carcinoma in situ (DCIS) of breast 12/28/2018  . Pain of left breast 12/28/2018  . Mixed hyperlipidemia 10/28/2018  . Trigger finger, right middle finger 10/14/2018  . Dry mouth, unspecified 10/14/2018  . Allergic 07/15/2018  . Hoarseness of voice 07/15/2018  . Umbilical hernia 99991111  . Diastolic dysfunction 99991111  . Depression 11/07/2017  . Heart murmur 11/07/2017  . Obesity (BMI 30-39.9) 08/24/2016  . Type 2 diabetes mellitus with hyperlipidemia ([place]) 05/01/2016  . Colon polyps 07/17/2015  . Vitamin D deficiency 06/12/2015  . Ductal carcinoma in situ (DCIS) of left breast 02/15/2015  . Papilloma of right breast 02/15/2015  . Idiopathic chronic gout 11/23/2014  . Non-insulin dependent type 2 diabetes mellitus ([place]) 10/13/2013  . Internal derangement of knee 12/29/2012  . Osteoarthrosis involving lower leg 12/29/2012  . Osteopenia 03/15/2012  . Essential hypertension 06/20/2011  . Cough 06/20/2011    Lovvorn, Annye Rusk MS, CCC-SLP 03/14/2019, 3:24  PM  [facility] [address] [place], [place], [address] Phone: [phone]   Fax:  [phone]   Name: Jasmine Lopez MRN: [id number] Date of Birth: June 09, 1940

## 2019-03-14 NOTE — Patient Instructions (Signed)
Great job today talking with a gentle voice and sniffing during conversation  Go over your speech notes with your family so they can support your relaxed throat and gentle voice  Let the person you are talking with know that you may have to stop and sniff in a weird spot in the conversation and they should try to not interrupt you - you can use your finger to let them know you aren't done   Tell family to limit air fresheners, scented candles perfumes etc during larger holiday gatherings - have your niece tell them  Practice sniffing more frequently during conversations  When you hear crackle voice - you need to take a break and relax tension in your throat - gentle voice  Have someone put up a finger every time you clear your throat

## 2019-03-16 ENCOUNTER — Other Ambulatory Visit: Payer: Self-pay

## 2019-03-16 ENCOUNTER — Ambulatory Visit: Payer: Medicare Other

## 2019-03-16 DIAGNOSIS — R498 Other voice and resonance disorders: Secondary | ICD-10-CM

## 2019-03-16 NOTE — Patient Instructions (Signed)
Use a note card or put a note in your notes app with all the "tools" you have to lessen or eliminate the feeling of VCD

## 2019-03-16 NOTE — Therapy (Signed)
[facility] [address] [place], [place], [address] Phone: [phone]   Fax:  [phone]  Speech Language Pathology Treatment  Patient Details  Name: Jasmine Lopez MRN: [id number] Date of Birth: Mar 16, 1941 Referring Provider (SLP): Dr. Christinia Gully   Encounter Date: 03/16/2019  End of Session - 03/16/19 1053    Visit Number  7    Number of Visits  17    Date for SLP Re-Evaluation  04/11/19    SLP Start Time  0932    SLP Stop Time   [id number]    SLP Time Calculation (min)  43 min    Activity Tolerance  Patient tolerated treatment well       Past Medical History:  Diagnosis Date  . Diabetes mellitus without complication ([place])   . Hypertension   . Irregular heart beat     Past Surgical History:  Procedure Laterality Date  . BREAST EXCISIONAL BIOPSY Bilateral     There were no vitals filed for this visit.  Subjective Assessment - 03/16/19 0940    Subjective  Pt brought her water to therapy.            ADULT SLP TREATMENT - 03/16/19 0949      General Information   Behavior/Cognition  Alert;Cooperative;Pleasant mood      Cognitive-Linquistic Treatment   Treatment focused on  Voice;Patient/family/caregiver education    Skilled Treatment  Pt reports still 70% improvement with cough. SLP added relaxation visualization as a "tool" to use for those times when pt has a surprise episode. Pt with vocal fry throughout conversation however pt c/o putting "a lot of thought" into using the abdominal breathing (AB) so SLP did not target this in conversation today. Approx 35 minutes into session pt with more intense feeling of need to throat - pt was cued to use relaxation visualization, feeling AB, sniff-blow, Using this episode as an example SLP suggested pt write down all the "tools" she has learned so far on a small notecard or in a note in a notes app so she can more efficiently bring strategies to mind  to quell  the VCD symptom. 6 throat clears today - 4 of those in last 8 minutes of therapy      Assessment / Recommendations / [place] with current plan of care      Progression Toward Goals   Progression toward goals  Progressing toward goals       SLP Education - 03/16/19 1052    Education Details  using a note card for ways to decr VCD symptoms    Person(s) Educated  Patient    Methods  Explanation    Comprehension  Verbalized understanding       SLP Short Term Goals - 03/16/19 1053      SLP SHORT TERM GOAL #1   Title  Pt will converse for 8 minutes using abdominal breathing with occasional min A over 2 sessions    Baseline  03/07/19; 03/14/19    Time  2    Period  Weeks    Status  Achieved      SLP SHORT TERM GOAL #2   Title  Pt will report successful achievement of relaxation techniques 2x outside of therpay sessions over 3 sessions    Baseline  03/07/19;    Time  2    Period  Weeks    Status  Achieved      SLP SHORT TERM GOAL #  3   Title  Pt will report decreased frequency of VCD symptoms outside of therapy session by 25% subjectively    Status  Achieved       SLP Long Term Goals - 03/16/19 1053      SLP LONG TERM GOAL #1   Title  Pt will demonstrate or report use of abdominal breathing with sniff/blow to reduce VCD episode with occasional min A    Baseline  03/07/19; 03/14/19    Time  5    Period  Weeks    Status  On-going      SLP LONG TERM GOAL #2   Title  Pt will converse for 12 minutes with abdominal breathing with rare min A    Time  5    Period  Weeks    Status  On-going      SLP LONG TERM GOAL #3   Title  Pt will report decreased frequency of VCD symptoms outside of therapy room by 75%    Time  5    Period  Weeks    Status  On-going   upgraded from 50% to 75%      Plan - 03/16/19 1053    Clinical Impression Statement  Ms. Loyal continues to  practice abdominal sniff/blow at home with relaxation  She has had success using this strategy to  control her cough when she was exposed to cooking smoke at her daughters house and when she feels a "tickle" . Ms. Musto continues to require cues to reduce tension in her shoulders and neck. Frequent throat clears persist, ongoing training for throat clear alternative. Continue skilled ST to reduce chronic cough for improved QOL    Speech Therapy Frequency  2x / week    Duration  --   8 weeks or 17 visits   Treatment/Interventions  SLP instruction and feedback;Compensatory strategies;Functional tasks;Compensatory techniques;Internal/external aids;Patient/family education;Other (comment)    Potential to Achieve Goals  Good    Potential Considerations  Severity of impairments;Other (comment)       Patient will benefit from skilled therapeutic intervention in order to improve the following deficits and impairments:   Other voice and resonance disorders    Problem List Patient Active Problem List   Diagnosis Date Noted  . Upper airway cough syndrome 02/02/2019  . History of ductal carcinoma in situ (DCIS) of breast 12/28/2018  . Pain of left breast 12/28/2018  . Mixed hyperlipidemia 10/28/2018  . Trigger finger, right middle finger 10/14/2018  . Dry mouth, unspecified 10/14/2018  . Allergic 07/15/2018  . Hoarseness of voice 07/15/2018  . Umbilical hernia 99991111  . Diastolic dysfunction 99991111  . Depression 11/07/2017  . Heart murmur 11/07/2017  . Obesity (BMI 30-39.9) 08/24/2016  . Type 2 diabetes mellitus with hyperlipidemia ([place]) 05/01/2016  . Colon polyps 07/17/2015  . Vitamin D deficiency 06/12/2015  . Ductal carcinoma in situ (DCIS) of left breast 02/15/2015  . Papilloma of right breast 02/15/2015  . Idiopathic chronic gout 11/23/2014  . Non-insulin dependent type 2 diabetes mellitus ([place]) 10/13/2013  . Internal derangement of knee 12/29/2012  . Osteoarthrosis involving lower leg 12/29/2012  . Osteopenia 03/15/2012  . Essential hypertension 06/20/2011  . Cough  06/20/2011    Caprisha Bridgett ,MS, CCC-SLP  03/16/2019, 10:55 AM  [facility] [address] [place], [place], [address] Phone: [phone]   Fax:  [phone]   Name: DENYLA TAM MRN: [id number] Date of Birth: 11/19/1940

## 2019-03-17 ENCOUNTER — Telehealth: Payer: Self-pay

## 2019-03-17 NOTE — Telephone Encounter (Signed)
Spoke with pt gave her providers message  Rodriguez[place], Sandrea Matte  Candiss Norse T, CMA        Please inform her that her mammogram is normal.

## 2019-03-21 ENCOUNTER — Ambulatory Visit: Payer: Medicare Other | Admitting: Speech Pathology

## 2019-03-22 ENCOUNTER — Telehealth: Payer: Self-pay

## 2019-03-22 ENCOUNTER — Other Ambulatory Visit: Payer: Self-pay | Admitting: Nurse Practitioner

## 2019-03-22 MED ORDER — AMOXICILLIN 500 MG PO TABS: ORAL_TABLET | ORAL | 0 refills | Status: DC

## 2019-03-22 NOTE — Telephone Encounter (Signed)
I have sent a prescription for amoxicillin take 4 tablets by mouth 1 hour prior to dental procedure

## 2019-03-22 NOTE — Telephone Encounter (Signed)
First time going to dentist since she has been in [place] has always had a antibiotic whenever going to the dentist because she has high blood pressure, and a heart mumor she is not sure what antibiotic it was it's been over 28yrs ago. She has called the pharmacy to see if they can send over a request pharmacy stated she would need to have her Dr. Gabriel Carina send over a order since they don't have one in the system.

## 2019-03-22 NOTE — Telephone Encounter (Signed)
LVM with message from provider   Per Doreene Burke  I have sent a prescription for amoxicillin take 4 tablets by mouth 1 hour prior to dental procedure

## 2019-03-22 NOTE — Telephone Encounter (Signed)
Did they send a request for a refill?

## 2019-03-22 NOTE — Telephone Encounter (Signed)
Pt called asking for 1 day antibiotic she has a dentist appt tomorrow 03/23/2019 and stated she always has one before getting her teeth cleaned.  I made pt aware that her provider is not in the office until Thursday, pt stated she seen Dr. Laurance Flatten before in the past and asked if she can do it. I advised pt to have her pharmacy send over a request

## 2019-03-22 NOTE — Telephone Encounter (Signed)
Not yet

## 2019-03-23 ENCOUNTER — Ambulatory Visit: Payer: Medicare Other | Admitting: Speech Pathology

## 2019-03-25 ENCOUNTER — Ambulatory Visit: Payer: Medicare Other

## 2019-03-25 DIAGNOSIS — R1310 Dysphagia, unspecified: Secondary | ICD-10-CM

## 2019-03-25 DIAGNOSIS — R498 Other voice and resonance disorders: Secondary | ICD-10-CM | POA: Diagnosis not present

## 2019-03-25 NOTE — Therapy (Signed)
[facility] [address] [place], [place], [address] Phone: [phone]   Fax:  [phone]  Speech Language Pathology Treatment  Patient Details  Name: Jasmine Lopez MRN: [id number] Date of Birth: January 07, 1941 Referring Provider (SLP): Dr. Christinia Gully   Encounter Date: 03/25/2019  End of Session - 03/25/19 1543    Visit Number  8    Number of Visits  17    Date for SLP Re-Evaluation  04/11/19    SLP Start Time  [id number]    SLP Stop Time   1445    SLP Time Calculation (min)  42 min    Activity Tolerance  Patient tolerated treatment well       Past Medical History:  Diagnosis Date  . Diabetes mellitus without complication ([place])   . Hypertension   . Irregular heart beat     Past Surgical History:  Procedure Laterality Date  . BREAST EXCISIONAL BIOPSY Bilateral     There were no vitals filed for this visit.  Subjective Assessment - 03/25/19 1406    Subjective  "It (note card) has been helpful for helping me relax and focus. I used to get anxious (about coughing)."            ADULT SLP TREATMENT - 03/25/19 1408      General Information   Behavior/Cognition  Alert;Cooperative;Pleasant mood      Cognitive-Linquistic Treatment   Treatment focused on  Voice    Skilled Treatment  Pt continues with vocal fry and SLP reiterated how to mitigate vocal fry. "I know, I keep that "groggy" voice sometimes, I'm trying to make it better." SLP worked with pt on reducing vocal fry with consistent cues to feel abdominal muscles working when speaking and to keep pitch up throughout the entire utterance - pt completed sentences SLP began with 50% succcess with WNL voicing throughout her utterance. Pt clearing her throat throughout the session, saying, "I know I'm not supposed to do that, it feels dry. I need to stop and drink my water (pt water bottle was sitting beside her)." Pt did so, 3 times during the session, take some  water. Pt ID'd when she felt symptoms of vocal cord dysfunction (VCD) and then gave a weak drawn out sniff for first 15 minutes of session. SLP corrected pt to give short, forceful sniff and provided rationale for this (which was provided previous session as well). Once pt performed quick sniff she said, "Oh yeah, I feel it now - like my throat opened up." however she did not use note card and began talking again and then cycle started over again. SLP reviewed previous SLP notes that pt must not talk until feeling of cough/throat clear subsides.  SLP told pt to practice "good voice" and her abdominal breathing 15-20 minutes twice each day when reading 1-2 sentences at a time.       Assessment / Recommendations / Plan   Plan  Continue with current plan of care      Progression Toward Goals   Progression toward goals  Not progressing toward goals (comment)   ? pt's practice at home      SLP Education - 03/25/19 1542    Education Details  quick sniff and easy blow instead of long drawn out sniff/inhale, rationale for quick sniff-blow, must not talk until s/sx VCD subside    Person(s) Educated  Patient    Methods  Explanation;Demonstration;Verbal cues    Comprehension  Verbalized understanding;Returned demonstration;Verbal  cues required;Need further instruction       SLP Short Term Goals - 03/25/19 1544      SLP SHORT TERM GOAL #1   Title  Pt will converse for 8 minutes using abdominal breathing with occasional min A over 2 sessions    Baseline  03/07/19; 03/14/19    Time  2    Period  Weeks    Status  Achieved      SLP SHORT TERM GOAL #2   Title  Pt will report successful achievement of relaxation techniques 2x outside of therpay sessions over 3 sessions    Baseline  03/07/19;    Time  2    Period  Weeks    Status  Achieved      SLP SHORT TERM GOAL #3   Title  Pt will report decreased frequency of VCD symptoms outside of therapy session by 25% subjectively    Status  Achieved        SLP Long Term Goals - 03/25/19 1425      SLP LONG TERM GOAL #1   Title  Pt will demonstrate or report use of abdominal breathing with sniff/blow to reduce VCD episode with occasional min A    Baseline  03/07/19; 03/14/19, 03-25-19    Time  4    Period  Weeks    Status  On-going      SLP LONG TERM GOAL #2   Title  Pt will converse for 12 minutes with abdominal breathing with rare min A    Time  4    Period  Weeks    Status  On-going      SLP LONG TERM GOAL #3   Title  Pt will report decreased frequency of VCD symptoms outside of therapy room by 75%    Status  Achieved   upgraded from 50% to 75%      Plan - 03/25/19 1543    Clinical Impression Statement  Pt cont to present with s/sx vocal cord dysfunction (VCD) during session today, req'd cues from SLP to subside including quick sniff-blow and cues not to talk again until s/sx subside. Jasmine Lopez continues to require cues to reduce tension in her shoulders and neck. Frequent throat clears persist, ongoing training for throat clear alternative. Continue skilled ST to reduce chronic cough for improved QOL    Speech Therapy Frequency  2x / week    Duration  --   8 weeks or 17 visits   Treatment/Interventions  SLP instruction and feedback;Compensatory strategies;Functional tasks;Compensatory techniques;Internal/external aids;Patient/family education;Other (comment)    Potential to Achieve Goals  Good    Potential Considerations  Severity of impairments;Other (comment)       Patient will benefit from skilled therapeutic intervention in order to improve the following deficits and impairments:   Other voice and resonance disorders  Dysphagia, unspecified type    Problem List Patient Active Problem List   Diagnosis Date Noted  . Upper airway cough syndrome 02/02/2019  . History of ductal carcinoma in situ (DCIS) of breast 12/28/2018  . Pain of left breast 12/28/2018  . Mixed hyperlipidemia 10/28/2018  . Trigger finger, right  middle finger 10/14/2018  . Dry mouth, unspecified 10/14/2018  . Allergic 07/15/2018  . Hoarseness of voice 07/15/2018  . Umbilical hernia [id number]  . Diastolic dysfunction 99991111  . Depression 11/07/2017  . Heart murmur 11/07/2017  . Obesity (BMI 30-39.9) 08/24/2016  . Type 2 diabetes mellitus with hyperlipidemia ([place]) 05/01/2016  . Colon polyps 07/17/2015  .  Vitamin D deficiency 06/12/2015  . Ductal carcinoma in situ (DCIS) of left breast 02/15/2015  . Papilloma of right breast 02/15/2015  . Idiopathic chronic gout 11/23/2014  . Non-insulin dependent type 2 diabetes mellitus ([facility]) 10/13/2013  . Internal derangement of knee 12/29/2012  . Osteoarthrosis involving lower leg 12/29/2012  . Osteopenia 03/15/2012  . Essential hypertension 06/20/2011  . Cough 06/20/2011    Ahmiyah Coil ,MS, CCC-SLP  03/25/2019, 3:45 PM  [facility] [address] [place], [place], [address] Phone: [phone]   Fax:  [phone]   Name: SHEINDY KLIPP MRN: [id number] Date of Birth: [birth year]

## 2019-03-28 ENCOUNTER — Telehealth: Payer: Self-pay | Admitting: Speech Pathology

## 2019-03-28 ENCOUNTER — Ambulatory Visit: Payer: Medicare Other | Admitting: Speech Pathology

## 2019-03-28 NOTE — Telephone Encounter (Signed)
Phoned Jasmine Lopez at 2:15, after 2 attempts to join [place] today, once and 9:30 then at 2:00. She was unable to join meetings. Reviewed strategies for abdominal sniff/blow, reviewed gentle onset voice and eliminating throat clears. Pt's voice remains harsh and hoarse. Reviewed possible ENT consult with Dr. Blenda Nicely. Jasmine Lopez reports cough remains controlled with abdominal sniff  Blow. Informed pt her next session is Monday 04/04/19 at 2:00 pm. She affirms she will have a family member present to assist with joining [facility] telehealth ST session.

## 2019-03-30 ENCOUNTER — Ambulatory Visit: Payer: Medicare Other | Admitting: Speech Pathology

## 2019-04-01 ENCOUNTER — Telehealth: Payer: Self-pay | Admitting: Internal Medicine

## 2019-04-01 DIAGNOSIS — R490 Dysphonia: Secondary | ICD-10-CM

## 2019-04-01 DIAGNOSIS — J383 Other diseases of vocal cords: Secondary | ICD-10-CM

## 2019-04-01 NOTE — Telephone Encounter (Signed)
lmtcb for pt.  

## 2019-04-04 ENCOUNTER — Other Ambulatory Visit: Payer: Self-pay

## 2019-04-04 ENCOUNTER — Ambulatory Visit: Payer: Medicare Other | Admitting: Speech Pathology

## 2019-04-04 ENCOUNTER — Encounter: Payer: Self-pay | Admitting: Speech Pathology

## 2019-04-04 DIAGNOSIS — R498 Other voice and resonance disorders: Secondary | ICD-10-CM | POA: Diagnosis not present

## 2019-04-04 NOTE — Telephone Encounter (Signed)
Order placed.   LM informing patient.   Nothing further needed at this time.

## 2019-04-04 NOTE — Telephone Encounter (Signed)
Spoke with pt, states that her speech therapist is recommending that she get a referral to a ENT for hoarseness, vocal cord dysfunction.  Pt wants to know if MW is willing to refer her.  Pt wishes to be referred to Dr. Gavin Pound through [facility].    MW please advise if you're ok with this referral.  Thanks!

## 2019-04-04 NOTE — Therapy (Signed)
[facility] [address] [place], [place], [address] Phone: [phone]   Fax:  [phone]  Speech Language Pathology Treatment  Patient Details  Name: Jasmine Lopez MRN: [id number] Date of Birth: [birth year] Referring Provider (SLP): Dr. Christinia Gully   Encounter Date: 04/04/2019  End of Session - 04/04/19 1458    Visit Number  10    Number of Visits  17    Date for SLP Re-Evaluation  04/22/19    SLP Start Time  [id number]    SLP Stop Time   1440    SLP Time Calculation (min)  35 min    Activity Tolerance  Patient tolerated treatment well       Past Medical History:  Diagnosis Date  . Diabetes mellitus without complication ([place])   . Hypertension   . Irregular heart beat     Past Surgical History:  Procedure Laterality Date  . BREAST EXCISIONAL BIOPSY Bilateral     There were no vitals filed for this visit.  Speech Therapy Telehealth Visit:  I connected with Jasmine Lopez today at 14:00 by [employer] and verified that I am speaking with the correct person using two identifiers.  I discussed the limitations, risks, security and privacy concerns of performing an evaluation and management service by [facility] and the availability of in person appointments.  I also discussed with the patient that there may be a patient responsible charge related to this service. The patient expressed understanding and agreed to proceed.    The patient's address was confirmed.  Identified to the patient that therapist is a licensed Psychologist, clinical in the state of [place].  Verified phone # as [phone] to call in case of technical difficulties.      Subjective Assessment - 04/04/19 1442    Subjective  "The control of the cough has released me of anxiety"    Currently in Pain?  No/denies             Speech Therapy Progress Note  Dates of Reporting Period: 02/14/19 to 04/04/19  Objective Reports of  Subjective Statement: Pt and son report coughing reduced to 1x a day  Objective Measurements: Pt continues to use strained voice with throat clears   Goal Update: Continue goals  Plan: Continue POC  Reason Skilled Services are Required: Pt continues to cough daily and is using strained hoarse voice with throat clears, if not improved, VCD may remain      ADULT SLP TREATMENT - 04/04/19 1443      General Information   Behavior/Cognition  Alert;Cooperative;Pleasant mood      Treatment Provided   Treatment provided  Cognitive-Linquistic      Cognitive-Linquistic Treatment   Treatment focused on  Voice    Skilled Treatment  Jasmine Lopez reports continued improvement in reduction of cough. She states she coughs around 1x a day and is able to control the cough with the sniff blow breathing. Her son, [name], who briefly joined the telehealth session also reported short, controlled cough 1x a day. Jasmine Lopez continues to work on reducing her throat clears. Often, she is not awanre she clears her throat. I instructed Jasmine Lopez to giver her a little signal to alert her to her throat clearing frequency. Jasmine Lopez requires ongoing cueing for relaxation of her shoulders and neck usually. Abdominal sniff duirng conversation targeted today. Pt required occasional min modeling and verbal cues to carryover sniff to conversation. [name] and [place] educated to not talk when she is  coughing, but to stop talking, sniff and blow until the sensation passes.      Progression Toward Goals   Progression toward goals  Progressing toward goals       SLP Education - 04/04/19 1447    Education Details  Continue audible sniff and blow with abdomen - practice throughout the day, add in relaxation    Person(s) Educated  Patient;Child(ren)    Methods  Explanation;Demonstration;Verbal cues;Handout    Comprehension  Verbalized understanding;Returned demonstration;Need further instruction       SLP Short Term Goals -  04/04/19 1449      SLP SHORT TERM GOAL #1   Title  Pt will converse for 8 minutes using abdominal breathing with occasional min A over 2 sessions    Baseline  03/07/19; 03/14/19    Time  2    Period  Weeks    Status  Achieved      SLP SHORT TERM GOAL #2   Title  Pt will report successful achievement of relaxation techniques 2x outside of therpay sessions over 3 sessions    Baseline  03/07/19;    Time  2    Period  Weeks    Status  Achieved      SLP SHORT TERM GOAL #3   Title  Pt will report decreased frequency of VCD symptoms outside of therapy session by 25% subjectively    Status  Achieved       SLP Long Term Goals - 04/04/19 1450      SLP LONG TERM GOAL #1   Title  Pt will demonstrate or report use of abdominal breathing with sniff/blow to reduce VCD episode with occasional min A    Baseline  03/07/19; 03/14/19, 03-25-19, 04/04/19    Time  3    Period  Weeks    Status  Achieved      SLP LONG TERM GOAL #2   Title  Pt will converse for 12 minutes with abdominal breathing with rare min A    Time  3    Period  Weeks    Status  On-going      SLP LONG TERM GOAL #3   Title  Pt will report decreased frequency of VCD symptoms outside of therapy room by 75%    Status  Achieved   upgraded from 50% to 75%      Plan - 04/04/19 1448    Clinical Impression Statement  Pt cont to present with s/sx vocal cord dysfunction (VCD) during session today, req'd cues from SLP to subside including quick sniff-blow and cues not to talk again until s/sx subside. Jasmine Lopez continues to require cues to reduce tension in her shoulders and neck. Frequent throat clears persist, ongoing training for throat clear alternative. Continue skilled ST to reduce chronic cough for improved QOL    Speech Therapy Frequency  1x /week    Duration  --   8 weeks or 17 visits   Treatment/Interventions  SLP instruction and feedback;Compensatory strategies;Functional tasks;Compensatory techniques;Internal/external  aids;Patient/family education;Other (comment)    Potential to Achieve Goals  Good    Potential Considerations  Severity of impairments       Patient will benefit from skilled therapeutic intervention in order to improve the following deficits and impairments:   Other voice and resonance disorders    Problem List Patient Active Problem List   Diagnosis Date Noted  . Upper airway cough syndrome 02/02/2019  . History of ductal carcinoma in situ (DCIS) of breast 12/28/2018  .  Pain of left breast 12/28/2018  . Mixed hyperlipidemia 10/28/2018  . Trigger finger, right middle finger 10/14/2018  . Dry mouth, unspecified 10/14/2018  . Allergic 07/15/2018  . Hoarseness of voice 07/15/2018  . Umbilical hernia 99991111  . Diastolic dysfunction 99991111  . Depression 11/07/2017  . Heart murmur 11/07/2017  . Obesity (BMI 30-39.9) 08/24/2016  . Type 2 diabetes mellitus with hyperlipidemia ([place]) 05/01/2016  . Colon polyps 07/17/2015  . Vitamin D deficiency 06/12/2015  . Ductal carcinoma in situ (DCIS) of left breast 02/15/2015  . Papilloma of right breast 02/15/2015  . Idiopathic chronic gout 11/23/2014  . Non-insulin dependent type 2 diabetes mellitus ([place]) 10/13/2013  . Internal derangement of knee 12/29/2012  . Osteoarthrosis involving lower leg 12/29/2012  . Osteopenia 03/15/2012  . Essential hypertension 06/20/2011  . Cough 06/20/2011    Lovvorn, Annye Rusk MS, CCC-SLP 04/04/2019, 3:01 PM  [facility] [address] [place], [place], [address] Phone: [phone]   Fax:  [phone]   Name: AVITA LARGAESPADA MRN: [id number] Date of Birth: 1940-12-15

## 2019-04-04 NOTE — Patient Instructions (Signed)
     Great job sensing when a cough is coming and using the sniff blow to help it pass. Audible sniffs and blows are OK and can be helpful  Continue the good work using a gentle voice with little strain. When you feel the strain, sniff, blow and relax tension in shoulders and nect  If you get to see Dr. Blenda Nicely, let her know that you feel strain when you talk in your "normal" pitch  When your voice gets hoarse or gravelly, take a big sniff  Practice conversation using sniff then talk - you need to sniff more frequently than you realize  Jasmine Lopez will give you a signal when you clear your throat - sniff blow after he lets you know  Have a happy Thanksgiving!

## 2019-04-04 NOTE — Telephone Encounter (Signed)
Fine with me, dx hoarseness / vcd

## 2019-04-04 NOTE — Telephone Encounter (Signed)
Pt returning call.  [phone]. Referral to Dr. Glenetta Borg(?) ENT

## 2019-04-05 ENCOUNTER — Ambulatory Visit: Payer: Medicare Other | Admitting: Cardiovascular Disease

## 2019-04-05 ENCOUNTER — Encounter: Payer: Self-pay | Admitting: Cardiovascular Disease

## 2019-04-05 VITALS — BP 150/80 | HR 58 | Ht 64.0 in | Wt 199.0 lb

## 2019-04-05 DIAGNOSIS — R413 Other amnesia: Secondary | ICD-10-CM | POA: Diagnosis not present

## 2019-04-05 DIAGNOSIS — I10 Essential (primary) hypertension: Secondary | ICD-10-CM

## 2019-04-05 DIAGNOSIS — E7800 Pure hypercholesterolemia, unspecified: Secondary | ICD-10-CM | POA: Diagnosis not present

## 2019-04-05 DIAGNOSIS — Z5181 Encounter for therapeutic drug level monitoring: Secondary | ICD-10-CM

## 2019-04-05 MED ORDER — ATORVASTATIN CALCIUM 40 MG PO TABS: 40.0000 mg | ORAL_TABLET | Freq: Every day | ORAL | 3 refills | Status: DC

## 2019-04-05 MED ORDER — AMLODIPINE BESYLATE 10 MG PO TABS: 10.0000 mg | ORAL_TABLET | Freq: Every day | ORAL | 3 refills | Status: DC

## 2019-04-05 NOTE — Patient Instructions (Addendum)
Medication Instructions:  INCREASE YOUR AMLODIPINE TO 10 MG DAILY  START ATORVASTATIN 40 MG DAILY   *If you need a refill on your cardiac medications before your next appointment, please call your pharmacy*  Lab Work: FASTING LP/CMET IN 6-8 WEEKS   Testing/Procedures: NONE   Follow-Up: At [facility], you and your health needs are our priority.  As part of our continuing mission to provide you with exceptional heart care, we have created designated Provider Care Teams.  These Care Teams include your primary Cardiologist (physician) and Advanced Practice Providers (APPs -  Physician Assistants and Nurse Practitioners) who all work together to provide you with the care you need, when you need it.  Your next appointment:   4 month(s)  The format for your next appointment:   Either In Person or Virtual  Provider:   You may see Skeet Latch, MD or one of the following Advanced Practice Providers on your designated Care Team:    Kerin Ransom, PA-C  [place], [place]  Coletta Memos, [place]  Your physician recommends that you schedule a follow-up appointment in: [place] have been referred to NEUROLOGY   IF YOU DO NOT HEAR FROM THEM YOU CAN CALL THEM DIRECTLY AT [place] [facility]    Ambulatory referral to Neurology  Where: [facility] Address: [address] [place] [address] Phone: [phone] Expires: 04/04/2020 (requested)   Other Instructions  You will receive a phone call from the PREP exercise and nutrition program to schedule an initial assessment.

## 2019-04-05 NOTE — Progress Notes (Addendum)
Cardiology Office Note   Date:  04/05/2019   ID:  Jasmine, Lopez 08-11-1940, MRN [id number]  PCP:  Shelby Mattocks, PA-C  Cardiologist:   Skeet Latch, MD   No chief complaint on file.   History of Present Illness: Jasmine Lopez is a 78 y.o. female with hypertension, breast cancer, and diabetes here for follow up.  She was initially seen 10/2017 for heart murmur.  [name] recently moved to [place] from [place].  Her husband passed 02/2017 then her mother passed 06/2017.  She moved to be closer to her sons.  She established care with her PCP and was noted to have a murmur.  [name] was seeing a cardiologist in [place] due to poorly controlled hypertension.  She had a stress test around 2017 that was reportedly negative for ischemia.  She was started on metoprolol which helped her to feel less short of breath.  She was referred for an echocardiogram 11/17/2017 that revealed LVEF 65 to 70% and no LVH or intracavitary gradient.  She had no valvular heart disease. Her BP was above goal so amlodipine was added.  She followed up with Kerin Ransom on 06/2018 and her BP was still elevated.  She had a hard time cutting chlorthalidone in half so it was discontinued.  Metoprolol was switched to carvedilol due to bradycardia.  Her BP was a little elevated 02/2019, but this was 2/2 steroid use and she was only taking carvedilol once daily.  She thinks that her BP has been runningh high lately.  She attributes this to weight gain and inactivity.  She also doesn't always know what to eat..  She struggled with cough and has been diagnosed with vocal cord dysfunction.  She gets rehab and has GERD that is being treated.  This has helped her cough.  She still cannot speak in her normal voice.  She has no chest pain since her GERD has been treated.  She denies lower extremity edema, orthopnea or PND.  She notes that she has gained weight due to poor diet and not exercising due to the  pandemic.  She had an episode of shortness of breath going up and down steps two months ago.  She notes that her memory has been getting worse and her children are concerned.     Past Medical History:  Diagnosis Date   Diabetes mellitus without complication ([place])    Hypertension    Irregular heart beat     Past Surgical History:  Procedure Laterality Date   BREAST EXCISIONAL BIOPSY Bilateral      Current Outpatient Medications  Medication Sig Dispense Refill   acetaminophen (TYLENOL) 500 MG tablet Take 500 mg by mouth every 6 (six) hours as needed.     allopurinol (ZYLOPRIM) 100 MG tablet TAKE 1 TABLET BY MOUTH EVERY DAY 90 tablet 0   amLODipine (NORVASC) 10 MG tablet Take 1 tablet (10 mg total) by mouth daily. 90 tablet 3   amoxicillin (AMOXIL) 500 MG tablet Take 4 tablets by mouth 1 hour prior to dental procedure 4 tablet 0   aspirin 325 MG EC tablet Take 325 mg by mouth every 4 (four) hours as needed for pain.     benzonatate (TESSALON) 200 MG capsule Take 1 capsule (200 mg total) by mouth 3 (three) times daily as needed for cough. 30 capsule 1   Biotin 10000 MCG TBDP 1 qd     blood glucose meter kit and supplies KIT Dispense based on  patient and insurance preference. Use up to two times daily as directed. (FOR ICD-9 250.00, 250.01). 1 each 0   buPROPion (WELLBUTRIN XL) 300 MG 24 hr tablet TAKE ONE TABLET (300 MG DOSE) BY MOUTH DAILY.  3   carvedilol (COREG) 6.25 MG tablet TAKE 1 TABLET BY MOUTH TWICE A DAY 180 tablet 3   Cholecalciferol (VITAMIN D3) 2000 units TABS Take by mouth.     Cod Liver Oil 1000 MG CAPS Take 1 capsule by mouth daily.     famotidine (PEPCID) 20 MG tablet One after supper 30 tablet 11   Garlic 3354 MG CAPS Take 1 capsule by mouth daily.     glucose blood (ONE TOUCH ULTRA TEST) test strip TEST FASTING BLOOD SUGAR AND AND 2 HOURS AFTER MEALS     hydrochlorothiazide (HYDRODIURIL) 25 MG tablet Take 1 tablet (25 mg total) by mouth daily. 90  tablet 3   losartan (COZAAR) 100 MG tablet Take 1 tablet (100 mg total) by mouth daily. 90 tablet 3   metFORMIN (GLUCOPHAGE) 500 MG tablet Take 500 mg by mouth daily.     ONETOUCH DELICA LANCETS 56Y MISC TEST GLUCOSE DAILY FASTING AND 2 HOURS AFTER MEALS     pantoprazole (PROTONIX) 40 MG tablet Take 1 tablet (40 mg total) by mouth daily. Take 30-60 min before first meal of the day 30 tablet 2   atorvastatin (LIPITOR) 40 MG tablet Take 1 tablet (40 mg total) by mouth daily. 90 tablet 3   No current facility-administered medications for this visit.     Allergies:   Ace inhibitors and Benadryl [diphenhydramine]    Social History:  The patient  reports that she has never smoked. She has never used smokeless tobacco. She reports that she does not drink alcohol or use drugs.   Family History:  The patient's family history includes COPD in her mother; Cancer in her father; Heart attack in her maternal grandfather; Heart failure in her mother; Rectal cancer in her maternal grandmother.    ROS:  Please see the history of present illness.   Otherwise, review of systems are positive for hair thinning.   All other systems are reviewed and negative.    PHYSICAL EXAM: VS:  BP (!) 150/80    Pulse (!) 58    Ht '5\' 4"'  (1.626 m)    Wt 199 lb (90.3 kg)    SpO2 100%    BMI 34.16 kg/m  , BMI Body mass index is 34.16 kg/m. GENERAL:  Well appearing HEENT: Pupils equal round and reactive, fundi not visualized, oral mucosa unremarkable NECK:  No jugular venous distention, waveform within normal limits, carotid upstroke brisk and symmetric, no bruits, no thyromegaly LYMPHATICS:  No cervical adenopathy LUNGS:  Clear to auscultation bilaterally HEART:  RRR.  PMI not displaced or sustained,S1 and S2 within normal limits, no S3, no S4, no clicks, no rubs, II/VI systolic murmur at the LUSB ABD:  Flat, positive bowel sounds normal in frequency in pitch, no bruits, no rebound, no guarding, no midline pulsatile  mass, no hepatomegaly, no splenomegaly EXT:  2 plus pulses throughout, no edema, no cyanosis no clubbing SKIN:  No rashes no nodules NEURO:  Cranial nerves II through XII grossly intact, motor grossly intact throughout PSYCH:  Cognitively intact, oriented to person place and time   EKG:  EKG is ordered today. 02/09/18: Sinus bradycardia.  Rate 54 bpm.  Nonspecific T wave abnormalities. 04/05/19: Sinus bradycardia.  Rate 58 bpm.  Nonspecific T wave abnormalities  Echo 11/17/17: Study Conclusions  - Left ventricle: The cavity size was normal. Systolic function was   hyperdynamic. The estimated ejection fraction was in the range of   70% to 75%. Wall motion was normal; there were no regional wall   motion abnormalities. Features are consistent with a pseudonormal   left ventricular filling pattern, with concomitant abnormal   relaxation and increased filling pressure (grade 2 diastolic   dysfunction). - Mitral valve: Valve area by pressure half-time: 2.22 cm^2. - Left atrium: The atrium was moderately dilated. - Pulmonary arteries: PA peak pressure: 31 mm Hg (S).  Recent Labs: 01/20/2019: ALT 14; BUN 16; Creatinine, Ser 0.98; Hemoglobin 13.4; Platelets 238; Potassium 4.4; Sodium 141; TSH 1.490   10/15/2017: TSH 2.06, free T4 1.09 Hemoglobin A1c 6.6% Sodium 141, potassium 3.9, BUN 11, creatinine 0.83  01/21/2018: Total cholesterol 198, triglycerides 101, HDL 55, LDL 123  Lipid Panel    Component Value Date/Time   CHOL 194 01/20/2019 1255   TRIG 84 01/20/2019 1255   HDL 47 01/20/2019 1255   CHOLHDL 4.1 01/20/2019 1255   LDLCALC 132 (H) 01/20/2019 1255      Wt Readings from Last 3 Encounters:  04/05/19 199 lb (90.3 kg)  03/04/19 196 lb (88.9 kg)  02/15/19 196 lb (88.9 kg)      ASSESSMENT AND PLAN:  # Murmur:  Systolic murmur likely 2/2 hyperdynamic LV.  No evidence of LVOT gradient or valvular disease.    # Hypertension: BP remains above goal.  We will increase  amlodipine to 10 mg.  Continue carvedilol, hydrochlorothiazide, and losartan.  We will also refer her to the PREP program at the [facility] for nutritional education and exercise.  She knows that some of her poorly controlled blood pressure is due to physical inactivity.  # Hyperlipidemia: LDL 132 on 01/2019.  She self discontinued atorvastatin because she was doing better.  She is amenable to resuming 40 mg daily.  Repeat lipids and CMP in 6 to 8 weeks.  # Memory loss: Ms. Kage is concerned about her memory loss.  She did seem to lose her train of thought and have word finding difficulty.  Will refer to Neurology for a formal assessment.  Current medicines are reviewed at length with the patient today.  The patient does not have concerns regarding medicines.  The following changes have been made: Increase amlodipine and resume atorvastatin  Labs/ tests ordered today include:   Orders Placed This Encounter  Procedures   Lipid Profile   Comprehensive Metabolic Panel (CMET)   Ambulatory referral to Neurology   EKG 12-Lead     Disposition:   FU with Astraea Gaughran C. Oval Linsey, MD, [facility] in 4 months.  PharmD in 1 month.     Signed, Dmarius Reeder C. Oval Linsey, MD, [facility]  04/05/2019 1:09 PM    [place]

## 2019-04-11 ENCOUNTER — Telehealth: Payer: Self-pay | Admitting: Neurology

## 2019-04-11 NOTE — Telephone Encounter (Signed)
Ok to do virtual visit, please change to 1pm visit. The first slot in the afternoon

## 2019-04-11 NOTE — Progress Notes (Signed)
Received referral from [facility] for PREP Left VMT patient requesting call back about interest in starting 12/7 230pm class.

## 2019-04-11 NOTE — Telephone Encounter (Signed)
Pt is coming in for her first visit and wants to know if she can do a video appointment?

## 2019-04-13 ENCOUNTER — Ambulatory Visit: Payer: Medicare Other

## 2019-04-14 ENCOUNTER — Ambulatory Visit: Payer: Medicare Other | Admitting: Neurology

## 2019-04-15 ENCOUNTER — Encounter: Payer: Self-pay | Admitting: Internal Medicine

## 2019-04-15 ENCOUNTER — Other Ambulatory Visit: Payer: Self-pay

## 2019-04-15 ENCOUNTER — Ambulatory Visit (INDEPENDENT_AMBULATORY_CARE_PROVIDER_SITE_OTHER): Payer: Medicare Other | Admitting: Internal Medicine

## 2019-04-15 DIAGNOSIS — R058 Other specified cough: Secondary | ICD-10-CM

## 2019-04-15 DIAGNOSIS — R635 Abnormal weight gain: Secondary | ICD-10-CM | POA: Insufficient documentation

## 2019-04-15 DIAGNOSIS — R05 Cough: Secondary | ICD-10-CM

## 2019-04-15 MED ORDER — BENZONATATE 200 MG PO CAPS: 200.0000 mg | ORAL_CAPSULE | Freq: Three times a day (TID) | ORAL | 11 refills | Status: DC | PRN

## 2019-04-15 MED ORDER — FAMOTIDINE 20 MG PO TABS: ORAL_TABLET | ORAL | 11 refills | Status: DC

## 2019-04-15 NOTE — Patient Instructions (Addendum)
Change pepcid (famotdine) 20 mg after bfast and supper   Remember to keep the candy handy   Do not refill pantoprazole   For drainage / throat tickle try take CHLORPHENIRAMINE  4 mg  (Chlortab 4mg   at [employer] should be easiest to find in the green box)  take one every 4 hours as needed - available over the counter- may cause drowsiness so start with just a bedtime dose or two and see how you tolerate it before trying in daytime   (1 penny per pill on [facility] prime)   Please schedule a follow up visit in 6  months but call sooner if needed

## 2019-04-15 NOTE — Assessment & Plan Note (Signed)
Advised' Weight control is simply a matter of calorie balance which needs to be tilted in your favor by eating less and exercising more.  To get the most out of exercise, you need to be continuously aware that you are short of breath, but never out of breath, for 30 minutes daily. As you improve, it will actually be easier for you to do the same amount of exercise  in  30 minutes so always push to the level where you are short of breath.  If this does not result in gradual weight reduction then I strongly recommend you see a nutritionist with a food diary x 2 weeks so that we can work out a negative calorie balance which is universally effective in steady weight loss programs.  Think of your calorie balance like you do your bank account where in this case you want the balance to go down so you must take in less calories than you burn up.  It's just that simple:  Hard to do, but easy to understand.  Good luck!

## 2019-04-15 NOTE — Progress Notes (Signed)
Jasmine Lopez, female    DOB: [birth year],     MRN: [id number]   Brief patient profile:  42 yobf never smoker hay fever as child better p left [place] in her 44s and then started pattern of chronic cough after h1n1 and worse  fall 2018 so referred to pulmonary clinic 02/02/2019      History of Present Illness  02/02/2019  Pulmonary/ 1st office eval/Brax Walen  Chief Complaint  Patient presents with  . Pulmonary Consult    Self referral. Pt c/o cough x 2 yrs- non prod and occurs when she is exposed to certain smells and occ when she lies down.   cough is daily assoc hoarseness with ent eval 7/0/7867  w[place]icki  "neg" - no notes in care everywhere Dyspnea:  Can walk up to an hour  Cough: dry hack/ better while asleep and not waking her up / worse with smells certain foods with sense of globus worse immediately when lies down then resolves within  Few min and does not disturb sleep Sleep: sits up for a while immediately then later able to lie flat/ sleep fine SABA use: none  Prednisone helped / mint or water helps rec The key to effective treatment for your cough is eliminating   First take tessalon 200 mg up to every 6 hour until no cough at all x 3 straight days  Prednisone 10 mg take  4 each am x 2 days,   2 each am x 2 days,  1 each am x 2 days and stop (this is to eliminate allergies and inflammation from coughing) Protonix (pantoprazole) Take 30-60 min before first meal of the day and Pepcid 20 mg one bedtime plus chlorpheniramine 4 mg x1 at bedtime GERD (REFLUX)  Please schedule a follow up office visit in 4 weeks, sooner if needed  with all medications /inhalers/ solutions in hand so we can verify exactly what you are taking. This includes all medications from all doctors and over the counters      03/04/2019  f/u ov/Jalissa Heinzelman re:  cough x 2016 off ppi x two weeks Chief Complaint  Patient presents with  . Follow-up    Cough is 50% better since the last visit.    Dyspnea:  Not  limited by breathing from desired activities   Cough: dry  Very sensitive to temp changes and perfume Sleeping: sleeps fine  SABA use: none  02: none  Has not tried 1st gen H1 blockers per recs   rec An hour before bed take pepcid 20 mg and chlorpheniramine 4 mg  For drainage / throat tickle try take CHLORPHENIRAMINE  4 mg  (Chlortab 86m     Please schedule a follow up office visit in 6 weeks, call sooner if needed with all medications /inhalers/ solutions in hand     04/15/2019  f/u ov/Trentyn Boisclair re: cough x 2016 / did not bring any meds/not sure she has chlorpheniramine Chief Complaint  Patient presents with  . Follow-up    Cough is some better. Still has some hoarseness.   Dyspnea:  Not limited by breathing from desired activities  But very limited with activity since covid Cough: resolved to her satisfaction/ just throat clearing / better with tessalon  Sleeping: fine /sleeping 30 degrees electric bed  SABA use: none 02: none   No obvious day to day or daytime variability or assoc excess/ purulent sputum or mucus plugs or hemoptysis or cp or chest tightness, subjective wheeze or overt sinus or  hb symptoms.   Sleeping as above without nocturnal  or early am exacerbation  of respiratory  c/o's or need for noct saba. Also denies any obvious fluctuation of symptoms with weather or environmental changes or other aggravating or alleviating factors except as outlined above   No unusual exposure hx or h/o childhood pna/ asthma or knowledge of premature birth.  Current Allergies, Complete Past Medical History, Past Surgical History, Family History, and Social History were reviewed in [employer] record.  ROS  The following are not active complaints unless bolded Hoarseness, sore throat, dysphagia, dental problems, itching, sneezing,  nasal congestion or discharge of excess mucus or purulent secretions, ear ache,   fever, chills, sweats, unintended wt loss or wt gain,  classically pleuritic or exertional cp,  orthopnea pnd or arm/hand swelling  or leg swelling, presyncope, palpitations, abdominal pain, anorexia, nausea, vomiting, diarrhea  or change in bowel habits or change in bladder habits, change in stools or change in urine, dysuria, hematuria,  rash, arthralgias, visual complaints, headache, numbness, weakness or ataxia or problems with walking or coordination,  change in mood or  memory.        Current Meds  Medication Sig  . acetaminophen (TYLENOL) 500 MG tablet Take 500 mg by mouth every 6 (six) hours as needed.  Marland Kitchen allopurinol (ZYLOPRIM) 100 MG tablet TAKE 1 TABLET BY MOUTH EVERY DAY  . amLODipine (NORVASC) 10 MG tablet Take 1 tablet (10 mg total) by mouth daily.  Marland Kitchen amoxicillin (AMOXIL) 500 MG tablet Take 4 tablets by mouth 1 hour prior to dental procedure  . aspirin 325 MG EC tablet Take 325 mg by mouth every 4 (four) hours as needed for pain.  Marland Kitchen atorvastatin (LIPITOR) 40 MG tablet Take 1 tablet (40 mg total) by mouth daily.  . benzonatate (TESSALON) 200 MG capsule Take 1 capsule (200 mg total) by mouth 3 (three) times daily as needed for cough.  . Biotin 10000 MCG TBDP 1 qd  . blood glucose meter kit and supplies KIT Dispense based on patient and insurance preference. Use up to two times daily as directed. (FOR ICD-9 250.00, 250.01).  . carvedilol (COREG) 6.25 MG tablet TAKE 1 TABLET BY MOUTH TWICE A DAY  . Cholecalciferol (VITAMIN D3) 2000 units TABS Take by mouth.  Marland Kitchen Cod Liver Oil 1000 MG CAPS Take 1 capsule by mouth daily.  . famotidine (PEPCID) 20 MG tablet One after supper  . Garlic 8016 MG CAPS Take 1 capsule by mouth daily.  Marland Kitchen glucose blood (ONE TOUCH ULTRA TEST) test strip TEST FASTING BLOOD SUGAR AND AND 2 HOURS AFTER MEALS  . metFORMIN (GLUCOPHAGE) 500 MG tablet Take 500 mg by mouth daily.  Glory Rosebush DELICA LANCETS 55V MISC TEST GLUCOSE DAILY FASTING AND 2 HOURS AFTER MEALS  . pantoprazole (PROTONIX) 40 MG tablet Take 1 tablet (40 mg  total) by mouth daily. Take 30-60 min before first meal of the day             Past Medical History:  Diagnosis Date  . Diabetes mellitus without complication ([place])   . Hypertension   . Irregular heart beat        Objective:    amb pleasant obese bf nad / slt hoarse and occ throat clearing    04/15/2019        202   03/04/19 196 lb (88.9 kg)  02/15/19 196 lb (88.9 kg)  02/02/19 197 lb 12.8 oz (89.7 kg)    Vital  signs reviewed - Note on arrival 02 sats  98% on RA     HEENT : pt wearing mask not removed for exam due to covid -19 concerns.    NECK :  without JVD/Nodes/TM/ nl carotid upstrokes bilaterally   LUNGS: no acc muscle use,  Nl contour chest which is clear to A and P bilaterally without cough on insp or exp maneuvers   CV:  RRR  no s3 or murmur or increase in P2, and no edema   ABD:  Obese/soft and nontender with nl inspiratory excursion in the supine position. No bruits or organomegaly appreciated, bowel sounds nl  MS:  Nl gait/ ext warm without deformities, calf tenderness, cyanosis or clubbing No obvious joint restrictions   SKIN: warm and dry without lesions    NEURO:  alert, approp, nl sensorium with  no motor or cerebellar deficits apparent.             Assessment

## 2019-04-15 NOTE — Assessment & Plan Note (Addendum)
Onset ? p H1N1 but daily since fall 99991111  - cyclical cough rx 123456 with tessalon suppression  - 04/04/2019 referred to wfu/ [place]> rec speech therapy/ no change in rx - 04/15/2019 changed to pepcid 20 mg bid as no worse since stopped ppi on her own  Reminded about how/ when to 1st gen H1 blockers per guidelines  And supplement with tessalon pearls.  Discussed the recent press about ppi's in the context of a statistically significant (but questionably clinically relevant) increase in CRI in pts on ppi vs h2's > bottom line is the lowest dose of ppi that controls   gerd is the right dose and if that dose is zero that's fine esp since h2's are cheaper so changed to bid h2 and f/u can be in 6 m, sooner if needed   I had an extended discussion with the patient reviewing all relevant studies completed to date and  lasting 15 to 20 minutes of a 25 minute visit    Each maintenance medication was reviewed in detail including most importantly the difference between maintenance and prns and under what circumstances the prns are to be triggered using an action plan format that is not reflected in the computer generated alphabetically organized AVS.     Please see AVS for specific instructions unique to this visit that I personally wrote and verbalized to the the pt in detail and then reviewed with pt  by my nurse highlighting any  changes in therapy recommended at today's visit to their plan of care.

## 2019-04-18 ENCOUNTER — Telehealth: Payer: Self-pay

## 2019-04-18 NOTE — Telephone Encounter (Signed)
Pt LVM for someone to call her back. Called pt she spent Thanksgiving with her family, a couple days later her niece and great grand niece tested positive for COVID and wanted to know where she can go and get tested at. [facility] [place] [facility] to be tested today 04/18/2019

## 2019-04-20 ENCOUNTER — Ambulatory Visit: Payer: Medicare Other | Attending: Nurse Practitioner | Admitting: Speech Pathology

## 2019-04-20 ENCOUNTER — Encounter: Payer: Self-pay | Admitting: Speech Pathology

## 2019-04-20 ENCOUNTER — Other Ambulatory Visit: Payer: Self-pay

## 2019-04-20 DIAGNOSIS — R498 Other voice and resonance disorders: Secondary | ICD-10-CM | POA: Diagnosis present

## 2019-04-20 NOTE — Patient Instructions (Signed)
Muscle Tension Dysphonia  Do 3x a day:  Blow bubbles 15 breaths quiet (no voice)  Blow bubbles 15 breaths voice on  Blow bubbles with accents (like a siren) 15 breaths  Voice on through straw (out of water) 15 breaths  Accents through straw 15 breaths  Long song ([employer])  Skinny straw is best - even a coffee stirrer   When we do the "flow voice" with the tissue - the goal is 80% air and only 20% voice coming out continuously. We will practice this together in therapy.  Great Job with reducing throat clears!!!   Semi-occluded vocal tract exercises (SOVTE)  These allow your vocal folds to vibrate without excess tension and promotes high placement of the voice  Use SOVTE as a warm up before prolonged speaking and vocal exercises   High resistance: voicing through a stirring straw  Medium resistance: voicing through a drinking straw   Watch and DO Vocal Straw Exercises with Lolita Cram on YouTube:  [email or web address]  Pitch Glides for 2 minutes  Accents (siren)  Hum the [employer]   As always, use good belly breathing while completing SOVTE

## 2019-04-22 NOTE — Progress Notes (Signed)
LVMT patient reference PREP class start 05/16/2019 Offered virtual exercise (online via WebEx) if pt is interested.

## 2019-04-25 NOTE — Therapy (Signed)
[facility] [address] [place], [place], [address] Phone: [phone]   Fax:  [phone]  Speech Language Pathology Treatment  Patient Details  Name: Jasmine Lopez MRN: [id number] Date of Birth: 01-28-1941 Referring Provider (SLP): Dr. Christinia Gully   Encounter Date: 04/20/2019  End of Session - 04/25/19 1249    Visit Number  11    Number of Visits  35    Date for SLP Re-Evaluation  06/20/19    SLP Start Time  4401    SLP Stop Time   1443    SLP Time Calculation (min)  40 min    Activity Tolerance  Patient tolerated treatment well       Past Medical History:  Diagnosis Date  . Diabetes mellitus without complication ([place])   . Hypertension   . Irregular heart beat     Past Surgical History:  Procedure Laterality Date  . BREAST EXCISIONAL BIOPSY Bilateral     There were no vitals filed for this visit.           SLP Education - 04/25/19 1242    Education Details  HEP for MTD    Person(s) Educated  Patient    Methods  Explanation;Demonstration;Verbal cues;Handout   Link to [employer] video   Comprehension  Verbalized understanding;Returned demonstration;Need further instruction       SLP Short Term Goals - 04/25/19 1247      SLP SHORT TERM GOAL #1   Title  Pt will converse for 8 minutes using abdominal breathing with occasional min A over 2 sessions    Baseline  03/07/19; 03/14/19    Time  2    Period  Weeks    Status  Achieved      SLP SHORT TERM GOAL #2   Title  Pt will report successful achievement of relaxation techniques 2x outside of therpay sessions over 3 sessions    Baseline  03/07/19;    Time  2    Period  Weeks    Status  Achieved      SLP SHORT TERM GOAL #3   Title  Pt will report decreased frequency of VCD symptoms outside of therapy session by 25% subjectively    Status  Achieved       SLP Long Term Goals - 04/25/19 1247      SLP LONG TERM GOAL #1   Title  Pt will  demonstrate or report use of abdominal breathing with sniff/blow to reduce VCD episode with occasional min A    Baseline  03/07/19; 03/14/19, 03-25-19, 04/04/19    Time  3    Period  Weeks    Status  Achieved      SLP LONG TERM GOAL #2   Title  Pt will converse for 12 minutes with abdominal breathing with rare min A    Time  8    Period  Weeks    Status  On-going      SLP LONG TERM GOAL #3   Title  Pt will report decreased frequency of VCD symptoms outside of therapy room by 75%    Status  Achieved   upgraded from 50% to 75%     SLP LONG TERM GOAL #4   Title  Pt will complete HEP for MTD with occasional min A over 3 sessions    Time  8    Period  Weeks    Status  New      SLP LONG TERM GOAL #  5   Title  Pt will uitlize flow phonation in sentence responses 18/20 with occasional min A    Time  8    Period  Weeks    Status  New      Additional Long Term Goals   Additional Long Term Goals  Yes      SLP LONG TERM GOAL #6   Title  Pt will converse for 15 minutes in complex conversation with adequate breath support and WNL phonation with occasional min A    Time  8    Period  Weeks       Plan - 04/25/19 1243    Clinical Impression Statement  Jasmine Lopez has met her goals for chronic cough. She is cotrolling her cough with breathing techniques and reports subjectively that her cough is at least 75% reduced. Jasmine Lopez voice remains gravelly, hoarse with significant glottal fry. ENT consult revealved  Muscle Tension Dysphonia (MTD), which in my opinion is a secondary behavior from Jasmine Lopez as she holds in air during speech to prevent cough. Jasmine Lopez reports her family does have to ask her to repeat herself as her voice affects intelligilbity. I initiated HEP for MTD. Jasmine Lopez required max A for flow phonation, so I have not included this in HEP.  I reocmmend pt continue skilled ST to maximize voice and intelligilbilit. Goals updated,    Speech Therapy Frequency  2x / week    Duration  --    16 weeks or 35 visits   Treatment/Interventions  SLP instruction and feedback;Compensatory strategies;Functional tasks;Compensatory techniques;Internal/external aids;Patient/family education;Other (comment)       Patient will benefit from skilled therapeutic intervention in order to improve the following deficits and impairments:   Other voice and resonance disorders    Problem List Patient Active Problem List   Diagnosis Date Noted  . Weight gain 04/15/2019  . Upper airway cough syndrome 02/02/2019  . History of ductal carcinoma in situ (DCIS) of breast 12/28/2018  . Pain of left breast 12/28/2018  . Mixed hyperlipidemia 10/28/2018  . Trigger finger, right middle finger 10/14/2018  . Dry mouth, unspecified 10/14/2018  . Allergic 07/15/2018  . Hoarseness of voice 07/15/2018  . Umbilical hernia 41/66/0630  . Diastolic dysfunction 16/05/930  . Depression 11/07/2017  . Heart murmur 11/07/2017  . Obesity (BMI 30-39.9) 08/24/2016  . Type 2 diabetes mellitus with hyperlipidemia ([place]) 05/01/2016  . Colon polyps 07/17/2015  . Vitamin D deficiency 06/12/2015  . Ductal carcinoma in situ (DCIS) of left breast 02/15/2015  . Papilloma of right breast 02/15/2015  . Idiopathic chronic gout 11/23/2014  . Non-insulin dependent type 2 diabetes mellitus ([place]) 10/13/2013  . Internal derangement of knee 12/29/2012  . Osteoarthrosis involving lower leg 12/29/2012  . Osteopenia 03/15/2012  . Essential hypertension 06/20/2011  . Cough 06/20/2011    Novia Lansberry, Jasmine Rusk MS, CCC-SLP 04/25/2019, 12:50 PM  [facility] [address] [place], [place], [address] Phone: [phone]   Fax:  [phone]   Name: Jasmine Lopez MRN: [id number] Date of Birth: 31-Jan-1941

## 2019-04-28 ENCOUNTER — Telehealth: Payer: Self-pay | Admitting: Cardiovascular Disease

## 2019-04-28 NOTE — Telephone Encounter (Signed)
Sure.  Let's stop it for two weeks.  Then start rosuvastatin 20mg .

## 2019-04-28 NOTE — Telephone Encounter (Signed)
New Message  Pt c/o medication issue:  1. Name of Medication: atorvastatin (LIPITOR) 40 MG tablet  2. How are you currently taking this medication (dosage and times per day)? Take 1 tablet (40 mg total) by mouth daily.  3. Are you having a reaction (difficulty breathing--STAT)? Yes. Leg weakness and memory issues  4. What is your medication issue? Patient wants to know if she can be switched to Crestor instead. Please give patient a call back.

## 2019-04-29 MED ORDER — ROSUVASTATIN CALCIUM 20 MG PO TABS: 20.0000 mg | ORAL_TABLET | Freq: Every day | ORAL | 5 refills | Status: DC

## 2019-04-29 NOTE — Telephone Encounter (Signed)
Left message to call back  

## 2019-04-29 NOTE — Addendum Note (Signed)
Addended by: Alvina Filbert B on: 04/29/2019 03:11 PM   Modules accepted: Orders

## 2019-04-29 NOTE — Telephone Encounter (Signed)
Advised patient, verbalized understanding  

## 2019-05-02 ENCOUNTER — Other Ambulatory Visit: Payer: Self-pay

## 2019-05-02 ENCOUNTER — Ambulatory Visit: Payer: Medicare Other | Admitting: Speech Pathology

## 2019-05-02 ENCOUNTER — Other Ambulatory Visit: Payer: Self-pay | Admitting: Internal Medicine

## 2019-05-02 ENCOUNTER — Other Ambulatory Visit: Payer: Self-pay | Admitting: Cardiovascular Disease

## 2019-05-02 DIAGNOSIS — R498 Other voice and resonance disorders: Secondary | ICD-10-CM | POA: Diagnosis not present

## 2019-05-02 NOTE — Patient Instructions (Signed)
  Do voice exercises 2-3x a day  Start with 5 long gargles  10 bubble hums as long as you can  10 bubble accents  10 blow with hum (no water)  10 accents  [employer]  "who" with tissue blow 10x (no voice) "he" 10x no voice "ha" 10x no voice  Who He Ha 10x no voice

## 2019-05-02 NOTE — Therapy (Signed)
[facility] [address] [place], [place], [address] Phone: [phone]   Fax:  [phone]  Speech Language Pathology Treatment  Patient Details  Name: Jasmine Lopez MRN: [id number] Date of Birth: May 31, 1940 Referring Provider (SLP): Dr. Christinia Gully   Encounter Date: 05/02/2019  End of Session - 05/02/19 1519    Visit Number  12    Number of Visits  35    Date for SLP Re-Evaluation  06/20/19    SLP Start Time  [id number]    SLP Stop Time   1314    SLP Time Calculation (min)  39 min    Activity Tolerance  Patient tolerated treatment well       Past Medical History:  Diagnosis Date  . Diabetes mellitus without complication ([place])   . Hypertension   . Irregular heart beat     Past Surgical History:  Procedure Laterality Date  . BREAST EXCISIONAL BIOPSY Bilateral     There were no vitals filed for this visit.  Subjective Assessment - 05/02/19 1510    Subjective  "I saw the video link, I didn't know I should watch it"    Currently in Pain?  No/denies          Speech Therapy Telehealth Visit:  I connected with Mackynzi Hafler today at 1230 by [employer] and verified that I am speaking with the correct person using two identifiers.  I discussed the limitations, risks, security and privacy concerns of performing an evaluation and management service by Webex and the availability of in person appointments.  I also discussed with the patient that there may be a patient responsible charge related to this service. The patient expressed understanding and agreed to proceed.    The patient's address was confirmed.  Identified to the patient that therapist is a licensed Psychologist, clinical in the state of [place].  Verified phone # as [phone] to call in case of technical difficulties.      ADULT SLP TREATMENT - 05/02/19 1511      General Information   Behavior/Cognition   Alert;Cooperative;Pleasant mood      Treatment Provided   Treatment provided  Cognitive-Linquistic      Cognitive-Linquistic Treatment   Treatment focused on  Voice;Patient/family/caregiver education    Skilled Treatment  Ongoing training of HEP for muscle tension dysphonia (MTD). Rayssa demonstrated gargle with head back and phonation blowing bubbles with occasional min A. Straw phonation with halting phonation and irregular phonation, c/w MTD. Pt cued for breath support and repeated gargle to reduce tension holding in airflow in larynx. Flow phonation initiated with unarticulated and articulated non voiced airflow release, inconsistently halting. Required usual min to mod A.       Assessment / Recommendations / Plan   Plan  Continue with current plan of care      Progression Toward Goals   Progression toward goals  Progressing toward goals       SLP Education - 05/02/19 1514    Education Details  HEP for MTD    Person(s) Educated  Patient    Methods  Explanation;Demonstration;Verbal cues;Handout    Comprehension  Verbalized understanding;Returned demonstration;Verbal cues required;Need further instruction       SLP Short Term Goals - 05/02/19 1518      SLP SHORT TERM GOAL #1   Title  Pt will converse for 8 minutes using abdominal breathing with occasional min A over 2 sessions    Baseline  03/07/19; 03/14/19  Time  2    Period  Weeks    Status  Achieved      SLP SHORT TERM GOAL #2   Title  Pt will report successful achievement of relaxation techniques 2x outside of therpay sessions over 3 sessions    Baseline  03/07/19;    Time  2    Period  Weeks    Status  Achieved      SLP SHORT TERM GOAL #3   Title  Pt will report decreased frequency of VCD symptoms outside of therapy session by 25% subjectively    Status  Achieved       SLP Long Term Goals - 05/02/19 1518      SLP LONG TERM GOAL #1   Title  Pt will demonstrate or report use of abdominal breathing with  sniff/blow to reduce VCD episode with occasional min A    Baseline  03/07/19; 03/14/19, 03-25-19, 04/04/19    Time  3    Period  Weeks    Status  Achieved      SLP LONG TERM GOAL #2   Title  Pt will converse for 12 minutes with abdominal breathing with rare min A    Time  7    Period  Weeks    Status  On-going      SLP LONG TERM GOAL #3   Title  Pt will report decreased frequency of VCD symptoms outside of therapy room by 75%    Status  Achieved   upgraded from 50% to 75%     SLP LONG TERM GOAL #4   Title  Pt will complete HEP for MTD with occasional min A over 3 sessions    Time  8    Period  Weeks    Status  On-going      SLP LONG TERM GOAL #5   Title  Pt will uitlize flow phonation in sentence responses 18/20 with occasional min A    Time  8    Period  Weeks    Status  On-going      SLP LONG TERM GOAL #6   Title  Pt will converse for 15 minutes in complex conversation with adequate breath support and WNL phonation with occasional min A    Time  8    Period  Weeks    Status  On-going       Plan - 05/02/19 1515    Clinical Impression Statement  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 HEP for MTD initiated with usual mod A. Pt continues to run out of air and report reduced intelligilbility with family and friends. Airflow release at non voiced vowels and fricatives result in halting, irregular exhalation. Continue skiled ST to maximize voice and intelligibility.    Speech Therapy Frequency  2x / week    Duration  --   16 weeks or 35 visits   Treatment/Interventions  SLP instruction and feedback;Compensatory strategies;Functional tasks;Compensatory techniques;Internal/external aids;Patient/family education;Other (comment)    Potential Considerations  Severity of impairments       Patient will benefit from skilled therapeutic intervention in order to improve the following deficits and impairments:   Other voice  and resonance disorders    Problem List Patient Active Problem List   Diagnosis Date Noted  . Weight gain 04/15/2019  . Upper airway cough syndrome 02/02/2019  . History of ductal carcinoma in situ (DCIS) of breast 12/28/2018  . Pain of left  breast 12/28/2018  . Mixed hyperlipidemia 10/28/2018  . Trigger finger, right middle finger 10/14/2018  . Dry mouth, unspecified 10/14/2018  . Allergic 07/15/2018  . Hoarseness of voice 07/15/2018  . Umbilical hernia 99991111  . Diastolic dysfunction 99991111  . Depression 11/07/2017  . Heart murmur 11/07/2017  . Obesity (BMI 30-39.9) 08/24/2016  . Type 2 diabetes mellitus with hyperlipidemia ([place]) 05/01/2016  . Colon polyps 07/17/2015  . Vitamin D deficiency 06/12/2015  . Ductal carcinoma in situ (DCIS) of left breast 02/15/2015  . Papilloma of right breast 02/15/2015  . Idiopathic chronic gout 11/23/2014  . Non-insulin dependent type 2 diabetes mellitus ([place]) 10/13/2013  . Internal derangement of knee 12/29/2012  . Osteoarthrosis involving lower leg 12/29/2012  . Osteopenia 03/15/2012  . Essential hypertension 06/20/2011  . Cough 06/20/2011    Sheryl Towell, Annye Rusk MS, CCC-SLP 05/02/2019, 3:20 PM  [facility] [address] [place], [place], [address] Phone: [phone]   Fax:  [phone]   Name: Jasmine Lopez MRN: [id number] Date of Birth: 02-15-1941

## 2019-05-04 ENCOUNTER — Ambulatory Visit: Payer: Medicare Other | Admitting: Speech Pathology

## 2019-05-04 DIAGNOSIS — R498 Other voice and resonance disorders: Secondary | ICD-10-CM

## 2019-05-04 NOTE — Patient Instructions (Signed)
   Pressed speech is when we hold in our breath when we talk - this is what you are doing.  Flow speech is when we let the air flow from our throat and lips as we talk - this is the goal   Do the following focusing on feeling the air flow from your lips  You should be able to say this and make the tissue move   FOOO-SHOOO  -10x  Jasmine Lopez - 10x  Who is Collie Siad? - 5x  Who are you? - 5x

## 2019-05-04 NOTE — Therapy (Signed)
[facility] [address] [place], [place], [address] Phone: [phone]   Fax:  [phone]  Speech Language Pathology Treatment  Patient Details  Name: Jasmine Lopez MRN: [id number] Date of Birth: 09/16/1940 Referring Provider (SLP): Dr. Christinia Gully   Encounter Date: 05/04/2019  End of Session - 05/04/19 1049    Visit Number  13    Number of Visits  35    Date for SLP Re-Evaluation  06/20/19    SLP Start Time  0850    SLP Stop Time   0925    SLP Time Calculation (min)  35 min    Activity Tolerance  Patient tolerated treatment well       Past Medical History:  Diagnosis Date  . Diabetes mellitus without complication ([place])   . Hypertension   . Irregular heart beat     Past Surgical History:  Procedure Laterality Date  . BREAST EXCISIONAL BIOPSY Bilateral     There were no vitals filed for this visit.  Subjective Assessment - 05/04/19 1033    Subjective  "I'm 5 minutes behind today, I don't know what happened"    Currently in Pain?  No/denies        Speech Therapy Telehealth Visit:  I connected with Jasmine Lopez today at 08:45 by [facility] video conference and verified that I am speaking with the correct person using two identifiers.  I discussed the limitations, risks, security and privacy concerns of performing an evaluation and management service by Webex and the availability of in person appointments.  I also discussed with the patient that there may be a patient responsible charge related to this service. The patient expressed understanding and agreed to proceed.    The patient's address was confirmed.  Identified to the patient that therapist is a licensed Psychologist, clinical in the state of [place].  Verified phone # as [phone] to call in case of technical difficulties.        ADULT SLP TREATMENT - 05/04/19 1034      General Information   Behavior/Cognition   Alert;Cooperative;Pleasant mood      Treatment Provided   Treatment provided  Cognitive-Linquistic      Cognitive-Linquistic Treatment   Treatment focused on  Voice;Patient/family/caregiver education    Skilled Treatment  Ongoing training of HEP for MTD. Pt is gargling and phonating with bubbles with occasional min A. Straw phonation with some improvement in halting, indicaitng some muscle tension reduction. Pt benefits from cue to "just relax and give a big sigh" for straw phonation. Airlfow release in unarticulated vowels and fricatives with mod I and tissue feedback initially. Articulated airflow (fu-shu; su-pu etc) Jasmine Lopez benefits from verbal cues to focus on the airflow on the fricative sound. Iniitated unarticulated airflow and voicing with frequent mod to max A to alternate from voiceless to voiced vowels and fricatives. (s to z; f to v; sh to dg and adding voice to /u/. Educated pt in pressed speech, vs breathy speech vs flow speech. I demonstrated pressed speech and normal (flow) speech - Jasmine Lopez is able to ID the difference acoustically. She contines to throat clear, with consistent cues for awareness of throat clear behavior.       Assessment / Recommendations / Plan   Plan  Continue with current plan of care      Progression Toward Goals   Progression toward goals  Progressing toward goals       SLP Education - 05/04/19 1047  Education Details  HEP for MTD; pressed speech vs flow speech vs breathy speech    Person(s) Educated  Patient    Methods  Explanation;Demonstration;Verbal cues;Handout;Other (comment)   visual cues (tissue) tactice cue (finger in front of mouth)      SLP Short Term Goals - 05/04/19 1049      SLP SHORT TERM GOAL #1   Title  Pt will converse for 8 minutes using abdominal breathing with occasional min A over 2 sessions    Baseline  03/07/19; 03/14/19    Time  2    Period  Weeks    Status  Achieved      SLP SHORT TERM GOAL #2   Title  Pt will  report successful achievement of relaxation techniques 2x outside of therpay sessions over 3 sessions    Baseline  03/07/19;    Time  2    Period  Weeks    Status  Achieved      SLP SHORT TERM GOAL #3   Title  Pt will report decreased frequency of VCD symptoms outside of therapy session by 25% subjectively    Status  Achieved       SLP Long Term Goals - 05/04/19 1049      SLP LONG TERM GOAL #1   Title  Pt will demonstrate or report use of abdominal breathing with sniff/blow to reduce VCD episode with occasional min A    Baseline  03/07/19; 03/14/19, 03-25-19, 04/04/19    Time  3    Period  Weeks    Status  Achieved      SLP LONG TERM GOAL #2   Title  Pt will converse for 12 minutes with abdominal breathing with rare min A    Time  7    Period  Weeks    Status  On-going      SLP LONG TERM GOAL #3   Title  Pt will report decreased frequency of VCD symptoms outside of therapy room by 75%    Status  Achieved   upgraded from 50% to 75%     SLP LONG TERM GOAL #4   Title  Pt will complete HEP for MTD with occasional min A over 3 sessions    Time  8    Period  Weeks    Status  On-going      SLP LONG TERM GOAL #5   Title  Pt will uitlize flow phonation in sentence responses 18/20 with occasional min A    Time  8    Period  Weeks    Status  On-going      SLP LONG TERM GOAL #6   Title  Pt will converse for 15 minutes in complex conversation with adequate breath support and WNL phonation with occasional min A    Time  8    Period  Weeks    Status  On-going       Plan - 05/04/19 1048    Clinical Impression Statement  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 HEP for MTD initiated with usual mod A. Pt continues to run out of air and report reduced intelligilbility with family and friends. Airflow release at non voiced vowels and fricatives result in halting, irregular exhalation. Continue skiled ST to maximize voice and  intelligibility.    Speech Therapy Frequency  2x / week    Duration  --   16 weeks or 35 visits  Treatment/Interventions  SLP instruction and feedback;Compensatory strategies;Functional tasks;Compensatory techniques;Internal/external aids;Patient/family education;Other (comment)    Potential to Achieve Goals  Good    Potential Considerations  Severity of impairments       Patient will benefit from skilled therapeutic intervention in order to improve the following deficits and impairments:   Other voice and resonance disorders    Problem List Patient Active Problem List   Diagnosis Date Noted  . Weight gain 04/15/2019  . Upper airway cough syndrome 02/02/2019  . History of ductal carcinoma in situ (DCIS) of breast 12/28/2018  . Pain of left breast 12/28/2018  . Mixed hyperlipidemia 10/28/2018  . Trigger finger, right middle finger 10/14/2018  . Dry mouth, unspecified 10/14/2018  . Allergic 07/15/2018  . Hoarseness of voice 07/15/2018  . Umbilical hernia 99991111  . Diastolic dysfunction 99991111  . Depression 11/07/2017  . Heart murmur 11/07/2017  . Obesity (BMI 30-39.9) 08/24/2016  . Type 2 diabetes mellitus with hyperlipidemia ([place]) 05/01/2016  . Colon polyps 07/17/2015  . Vitamin D deficiency 06/12/2015  . Ductal carcinoma in situ (DCIS) of left breast 02/15/2015  . Papilloma of right breast 02/15/2015  . Idiopathic chronic gout 11/23/2014  . Non-insulin dependent type 2 diabetes mellitus ([place]) 10/13/2013  . Internal derangement of knee 12/29/2012  . Osteoarthrosis involving lower leg 12/29/2012  . Osteopenia 03/15/2012  . Essential hypertension 06/20/2011  . Cough 06/20/2011    Mickle Campton, Annye Rusk MS, CCC-SLP 05/04/2019, 10:50 AM  [facility] [address] [place], [place], [address] Phone: [phone]   Fax:  [phone]   Name: Jasmine Lopez MRN: [id number] Date of Birth: 07-29-1940

## 2019-05-09 ENCOUNTER — Other Ambulatory Visit: Payer: Self-pay

## 2019-05-09 ENCOUNTER — Ambulatory Visit: Payer: Medicare Other

## 2019-05-09 DIAGNOSIS — R498 Other voice and resonance disorders: Secondary | ICD-10-CM | POA: Diagnosis not present

## 2019-05-09 NOTE — Therapy (Addendum)
[facility] [address] [place], [place], [address] Phone: [phone]   Fax:  [phone]  Speech Language Pathology Treatment  Patient Details  Name: Jasmine Lopez MRN: [id number] Date of Birth: [birth year] Referring Provider (SLP): Dr. Christinia Gully   Encounter Date: 05/09/2019  End of Session - 05/09/19 1313    Visit Number  14    Number of Visits  35    Date for SLP Re-Evaluation  06/20/19    SLP Start Time  [id number]    SLP Stop Time   [id number]    SLP Time Calculation (min)  35 min       Past Medical History:  Diagnosis Date  . Diabetes mellitus without complication ([place])   . Hypertension   . Irregular heart beat     Past Surgical History:  Procedure Laterality Date  . BREAST EXCISIONAL BIOPSY Bilateral     There were no vitals filed for this visit.  Subjective Assessment - 05/09/19 1150    Subjective  "I can tell I'm tense. I will do better on Wednesday."    Currently in Pain?  No/denies         I connected with Gwennette Camarata today at 1145 by Webex video conference and verified that I am speaking with the correct person using two identifiers.  I discussed the limitations, risks, security and privacy concerns of performing an evaluation and management service by Webex and the availability of in person appointments.  I also discussed with the patient that there may be a patient responsible charge related to this service. The patient expressed understanding and agreed to proceed.      The patient's address was confirmed.  Identified to the patient that therapist is a licensed Psychologist, clinical in the state of [place].   Verified phone # as [phone] to call in case of technical difficulties.     ADULT SLP TREATMENT - 05/09/19 1150      General Information   Behavior/Cognition  Alert;Cooperative;Pleasant mood      Treatment Provided   Treatment provided  Cognitive-Linquistic       Cognitive-Linquistic Treatment   Treatment focused on  Voice;Patient/family/caregiver education    Skilled Treatment  Ongoing training of HEP for MTD. Pt with consistent intermittent harsh vocal quality. Pt reports she needs to be better with consistency of homework practice. She has really only been practicing prior to her [facility] appointments. Pt gargled, and phonated with a straw in water with rare min A. Straw phonation in and out of water with some minor intermittent improvement in halting, indicaitng some intermittent reduction in muscle tension. however this did not transfer to other modes of phonation. Pt had arresting vocal quality with long, drawn out "whhhhhoooooooo" despite demo, verbal and visual cues. At 55 pt's niece called and pt wanted to call her back so she ended session early.       Assessment / Recommendations / Plan   Plan  Continue with current plan of care      Progression Toward Goals   Progression toward goals  Progressing toward goals         SLP Short Term Goals - 05/04/19 1049      SLP SHORT TERM GOAL #1   Title  Pt will converse for 8 minutes using abdominal breathing with occasional min A over 2 sessions    Baseline  03/07/19; 03/14/19    Time  2    Period  [place]  Status  Achieved      SLP SHORT TERM GOAL #2   Title  Pt will report successful achievement of relaxation techniques 2x outside of therpay sessions over 3 sessions    Baseline  03/07/19;    Time  2    Period  Weeks    Status  Achieved      SLP SHORT TERM GOAL #3   Title  Pt will report decreased frequency of VCD symptoms outside of therapy session by 25% subjectively    Status  Achieved       SLP Long Term Goals - 05/09/19 1323      SLP LONG TERM GOAL #1   Title  Pt will demonstrate or report use of abdominal breathing with sniff/blow to reduce VCD episode with occasional min A    Baseline  03/07/19; 03/14/19, 03-25-19, 04/04/19    Time  3    Period  Weeks    Status  Achieved      SLP  LONG TERM GOAL #2   Title  Pt will converse for 12 minutes with abdominal breathing with rare min A    Time  6    Period  Weeks    Status  On-going      SLP LONG TERM GOAL #3   Title  Pt will report decreased frequency of VCD symptoms outside of therapy room by 75%    Status  Achieved   upgraded from 50% to 75%     SLP LONG TERM GOAL #4   Title  Pt will complete HEP for MTD with occasional min A over 3 sessions    Time  7    Period  Weeks    Status  On-going      SLP LONG TERM GOAL #5   Title  Pt will uitlize flow phonation in sentence responses 18/20 with occasional min A    Time  7    Period  Weeks    Status  On-going      SLP LONG TERM GOAL #6   Title  Pt will converse for 15 minutes in complex conversation with adequate breath support and WNL phonation with occasional min A    Time  7    Period  Weeks    Status  On-going       Plan - 05/09/19 1321    Clinical Impression Statement  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 SLP cont'd to work with pt for effective production of her HEP, which pt states she does not practice enough outside of therapy sessions.  Pt continues to run out of air and report reduced intelligilbility with family and friends. Airflow release at non voiced vowels and fricatives result in halting, irregular exhalation. Continue skiled ST to maximize voice and intelligibility.    Speech Therapy Frequency  2x / week    Duration  --   16 weeks or 35 visits   Treatment/Interventions  SLP instruction and feedback;Compensatory strategies;Functional tasks;Compensatory techniques;Internal/external aids;Patient/family education;Other (comment)    Potential to Achieve Goals  Good    Potential Considerations  Severity of impairments       Patient will benefit from skilled therapeutic intervention in order to improve the following deficits and impairments:   Other voice and resonance  disorders    Problem List Patient Active Problem List   Diagnosis Date Noted  . Weight gain 04/15/2019  . Upper airway cough syndrome 02/02/2019  . History  of ductal carcinoma in situ (DCIS) of breast 12/28/2018  . Pain of left breast 12/28/2018  . Mixed hyperlipidemia 10/28/2018  . Trigger finger, right middle finger 10/14/2018  . Dry mouth, unspecified 10/14/2018  . Allergic 07/15/2018  . Hoarseness of voice 07/15/2018  . Umbilical hernia 99991111  . Diastolic dysfunction 99991111  . Depression 11/07/2017  . Heart murmur 11/07/2017  . Obesity (BMI 30-39.9) 08/24/2016  . Type 2 diabetes mellitus with hyperlipidemia ([place]) 05/01/2016  . Colon polyps 07/17/2015  . Vitamin D deficiency 06/12/2015  . Ductal carcinoma in situ (DCIS) of left breast 02/15/2015  . Papilloma of right breast 02/15/2015  . Idiopathic chronic gout 11/23/2014  . Non-insulin dependent type 2 diabetes mellitus ([place]) 10/13/2013  . Internal derangement of knee 12/29/2012  . Osteoarthrosis involving lower leg 12/29/2012  . Osteopenia 03/15/2012  . Essential hypertension 06/20/2011  . Cough 06/20/2011    Keisa Blow ,MS, CCC-SLP  05/09/2019, 1:23 PM  [facility] [address] [place], [place], [address] Phone: [phone]   Fax:  [phone]   Name: CALEA HEMPSTEAD MRN: [id number] Date of Birth: October 26, 1940

## 2019-05-10 ENCOUNTER — Ambulatory Visit: Payer: Medicare Other

## 2019-05-11 ENCOUNTER — Other Ambulatory Visit: Payer: Self-pay

## 2019-05-11 ENCOUNTER — Ambulatory Visit: Payer: Medicare Other

## 2019-05-11 DIAGNOSIS — R498 Other voice and resonance disorders: Secondary | ICD-10-CM | POA: Diagnosis not present

## 2019-05-11 NOTE — Therapy (Signed)
[facility] [address] [place], [place], [address] Phone: [phone]   Fax:  [phone]  Speech Language Pathology Treatment  Patient Details  Name: Jasmine Lopez MRN: [id number] Date of Birth: 1941/02/21 Referring Provider (SLP): Dr. Christinia Lopez   Encounter Date: 05/11/2019  End of Session - 05/11/19 1535    Visit Number  15    Number of Visits  35    Date for SLP Re-Evaluation  06/20/19    SLP Start Time  [id number]   could not find email invite for webex   SLP Stop Time   1445    SLP Time Calculation (min)  30 min    Activity Tolerance  Patient tolerated treatment well       Past Medical History:  Diagnosis Date  . Diabetes mellitus without complication ([place])   . Hypertension   . Irregular heart beat     Past Surgical History:  Procedure Laterality Date  . BREAST EXCISIONAL BIOPSY Bilateral     There were no vitals filed for this visit.   I connected with Jasmine Lopez today at 1415 by [employer] and verified that I am speaking with the correct person using two identifiers.  I discussed the limitations, risks, security and privacy concerns of performing an evaluation and management service by Webex and the availability of in person appointments.  I also discussed with the patient that there may be a patient responsible charge related to this service. The patient expressed understanding and agreed to proceed.      The patient's address was confirmed.  Identified to the patient that therapist is a licensed Psychologist, clinical in the state of [place].   Verified phone # as [phone] to call in case of technical difficulties.    Subjective Assessment - 05/11/19 1432    Subjective  "(my voice) seems better when I hum in the water. Out of the water there's something that makes me push."    Currently in Pain?  No/denies            ADULT SLP TREATMENT - 05/11/19 1433       General Information   Behavior/Cognition  Alert;Cooperative;Pleasant mood      Treatment Provided   Treatment provided  Cognitive-Linquistic      Cognitive-Linquistic Treatment   Treatment focused on  Voice;Patient/family/caregiver education    Skilled Treatment  Late start due to pt not being able to find the email invitation for Webex. Pt told SLP she had "practiced her sounds" yesterday and today in response to SLP "How has it been going with getting your exercises done?" Pt with hoarseness approx 50% of the time during vocalization and vocal fry at the ends of phrases/utterances 75% throughout the session today. SLP had pt use SOVT tasks such as straw phonation with and without water and pt told SLP that with water she felt her voice was more relaxed than out of the water - in fact pt had consistent phonation pattern 75% of the time in the water and approx 25% consistent phonatory pattern (arrested/random pattern) out of the water. Over time, her consistency of a more WNL phonatory pattern out of the water was not evident. Pt could not verbalize what she was doing differently with her voice in the water vs out of the water. SLP req'd to consistently cue pt for incr'd breath support with straw vocalization both in and out of the water.      Assessment /  Recommendations / Plan   Plan  Continue with current plan of care      Progression Toward Goals   Progression toward goals  --   pt gives good effort during ST sessions      SLP Education - 05/11/19 1535    Education Details  Maintaining HEP frequency is crucial for her to have success    Person(s) Educated  Patient    Methods  Explanation    Comprehension  Verbalized understanding;Need further instruction       SLP Short Term Goals - 05/04/19 1049      SLP SHORT TERM GOAL #1   Title  Pt will converse for 8 minutes using abdominal breathing with occasional min A over 2 sessions    Baseline  03/07/19; 03/14/19    Time  2    Period   Weeks    Status  Achieved      SLP SHORT TERM GOAL #2   Title  Pt will report successful achievement of relaxation techniques 2x outside of therpay sessions over 3 sessions    Baseline  03/07/19;    Time  2    Period  Weeks    Status  Achieved      SLP SHORT TERM GOAL #3   Title  Pt will report decreased frequency of VCD symptoms outside of therapy session by 25% subjectively    Status  Achieved       SLP Long Term Goals - 05/11/19 1640      SLP LONG TERM GOAL #1   Title  Pt will demonstrate or report use of abdominal breathing with sniff/blow to reduce VCD episode with occasional min A    Baseline  03/07/19; 03/14/19, 03-25-19, 04/04/19    Time  3    Period  Weeks    Status  Achieved      SLP LONG TERM GOAL #2   Title  Pt will converse for 12 minutes with abdominal breathing with rare min A    Time  6    Period  Weeks    Status  On-going      SLP LONG TERM GOAL #3   Title  Pt will report decreased frequency of VCD symptoms outside of therapy room by 75%    Status  Achieved   upgraded from 50% to 75%     SLP LONG TERM GOAL #4   Title  Pt will complete HEP for MTD with occasional min A over 3 sessions    Time  7    Period  Weeks    Status  On-going      SLP LONG TERM GOAL #5   Title  Pt will uitlize flow phonation in sentence responses 18/20 with occasional min A    Time  7    Period  Weeks    Status  On-going      SLP LONG TERM GOAL #6   Title  Pt will converse for 15 minutes in complex conversation with adequate breath support and WNL phonation with occasional min A    Time  7    Period  Weeks    Status  On-going       Plan - 05/11/19 1639    Clinical Impression Statement  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 Pt cont to have suboptimal completion of HEP outside ST sessions.  Pt continues to run out of air and report reduced intelligilbility with family  and friends. Continue skiled ST to maximize  voice and intelligibility.    Speech Therapy Frequency  2x / week    Duration  --   16 weeks or 35 visits   Treatment/Interventions  SLP instruction and feedback;Compensatory strategies;Functional tasks;Compensatory techniques;Internal/external aids;Patient/family education;Other (comment)    Potential to Achieve Goals  Good    Potential Considerations  Severity of impairments       Patient will benefit from skilled therapeutic intervention in order to improve the following deficits and impairments:   Other voice and resonance disorders    Problem List Patient Active Problem List   Diagnosis Date Noted  . Weight gain 04/15/2019  . Upper airway cough syndrome 02/02/2019  . History of ductal carcinoma in situ (DCIS) of breast 12/28/2018  . Pain of left breast 12/28/2018  . Mixed hyperlipidemia 10/28/2018  . Trigger finger, right middle finger 10/14/2018  . Dry mouth, unspecified 10/14/2018  . Allergic 07/15/2018  . Hoarseness of voice 07/15/2018  . Umbilical hernia 99991111  . Diastolic dysfunction 99991111  . Depression 11/07/2017  . Heart murmur 11/07/2017  . Obesity (BMI 30-39.9) 08/24/2016  . Type 2 diabetes mellitus with hyperlipidemia ([facility]) 05/01/2016  . Colon polyps 07/17/2015  . Vitamin D deficiency 06/12/2015  . Ductal carcinoma in situ (DCIS) of left breast 02/15/2015  . Papilloma of right breast 02/15/2015  . Idiopathic chronic gout 11/23/2014  . Non-insulin dependent type 2 diabetes mellitus ([place]) 10/13/2013  . Internal derangement of knee 12/29/2012  . Osteoarthrosis involving lower leg 12/29/2012  . Osteopenia 03/15/2012  . Essential hypertension 06/20/2011  . Cough 06/20/2011    Aaren Krog ,[place], CCC-SLP  05/11/2019, 4:41 PM  [facility] [address] [place], [place], [address] Phone: [phone]   Fax:  [phone]   Name: Jasmine Lopez MRN: [id number] Date of Birth: 03-13-1941

## 2019-05-16 ENCOUNTER — Other Ambulatory Visit: Payer: Self-pay

## 2019-05-16 ENCOUNTER — Ambulatory Visit: Payer: Medicare PPO | Attending: Nurse Practitioner | Admitting: Speech Pathology

## 2019-05-16 ENCOUNTER — Encounter: Payer: Self-pay | Admitting: Speech Pathology

## 2019-05-16 DIAGNOSIS — R498 Other voice and resonance disorders: Secondary | ICD-10-CM | POA: Diagnosis not present

## 2019-05-16 NOTE — Patient Instructions (Signed)
Great work keeping up with your exercises. We can start adding voice to your flow sounds  Add: (with a gentle flow voice)  Who  He   Lamonte Sakai - focus on the wh and h  She Sells Shells  Feel the fur  See Sue's Shoes  Fee Fie Foe Fum  Zip the Dell Far  My mom makes mashed potatoes  Lip trills or tongue trills can be used to reset your voice if you are somewhere where you don't have a straw

## 2019-05-16 NOTE — Therapy (Signed)
[facility] [address] [place], [place], [address] Phone: [phone]   Fax:  [phone]  Speech Language Pathology Treatment  Patient Details  Name: Jasmine Lopez MRN: [id number] Date of Birth: 1941/04/12 Referring Provider (SLP): Dr. Christinia Gully   Encounter Date: 05/16/2019  End of Session - 05/16/19 1503    Visit Number  16    Number of Visits  35    Date for SLP Re-Evaluation  06/20/19    SLP Start Time  [id number]    SLP Stop Time   1447    SLP Time Calculation (min)  45 min    Activity Tolerance  Patient tolerated treatment well       Past Medical History:  Diagnosis Date  . Diabetes mellitus without complication ([place])   . Hypertension   . Irregular heart beat     Past Surgical History:  Procedure Laterality Date  . BREAST EXCISIONAL BIOPSY Bilateral     There were no vitals filed for this visit.  Subjective Assessment - 05/16/19 1449    Subjective  "I couldn't get my computer to download - [name] was using it" pt came to session in person    Currently in Pain?  No/denies            ADULT SLP TREATMENT - 05/16/19 1449      General Information   Behavior/Cognition  Alert;Cooperative;Pleasant mood      Treatment Provided   Treatment provided  Cognitive-Linquistic      Cognitive-Linquistic Treatment   Treatment focused on  Voice;Patient/family/caregiver education    Skilled Treatment  Pt enters ST room with pressed speech (muscle tension dysphonia (MTD). Initiated flow at vowel level with occasional min A after warming up with sighs. Jasmine Lopez transitioned to articulated flow phonation in syllables and short phrases with occasional min A. She ID's when she is using pressed speech with rare min A and attempts to self correct. She benefits from going back to articulated flow phonation vowels and straw phonation to reset her voice. Jasmine Lopez did require instruction and demonstration to breathe more  frequently in structured phrases to achieve flow phonation. Carryover of flow phonation to simple conversation was achieved with occasional min A. I added flow phonation with fricatives and affricate phrases to her HEP as she was successful with this in therapy today. Instructed pt to use straw phonation, lip or tongue trills to reset her voice when she is having pressed, strained voice. Pt with 2 episodes of throat clears today - she reqiured ST cues for awareness of this. In straw phonation, Jasmine Lopez is achieving smooth, continuous phonation, indicating reduced laryngeal tension      Assessment / Recommendations / Plan   Plan  Continue with current plan of care      Progression Toward Goals   Progression toward goals  Progressing toward goals       SLP Education - 05/16/19 1455    Education Details  added flow phonation phrases to HEP; reset pressed voice with straw, hum or lip trills    Person(s) Educated  Patient    Methods  Explanation    Comprehension  Verbalized understanding;Returned demonstration;Verbal cues required;Need further instruction       SLP Short Term Goals - 05/16/19 1456      SLP SHORT TERM GOAL #1   Title  Pt will converse for 8 minutes using abdominal breathing with occasional min A over 2 sessions    Baseline  03/07/19; 03/14/19  Time  2    Period  Weeks    Status  Achieved      SLP SHORT TERM GOAL #2   Title  Pt will report successful achievement of relaxation techniques 2x outside of therpay sessions over 3 sessions    Baseline  03/07/19;    Time  2    Period  Weeks    Status  Achieved      SLP SHORT TERM GOAL #3   Title  Pt will report decreased frequency of VCD symptoms outside of therapy session by 25% subjectively    Status  Achieved       SLP Long Term Goals - 05/16/19 1457      SLP LONG TERM GOAL #1   Title  Pt will demonstrate or report use of abdominal breathing with sniff/blow to reduce VCD episode with occasional min A    Baseline   03/07/19; 03/14/19, 03-25-19, 04/04/19    Time  3    Period  Weeks    Status  Achieved      SLP LONG TERM GOAL #2   Title  Pt will converse for 12 minutes with abdominal breathing with rare min A    Time  6    Period  Weeks    Status  On-going      SLP LONG TERM GOAL #3   Title  Pt will report decreased frequency of VCD symptoms outside of therapy room by 75%    Status  Achieved   upgraded from 50% to 75%     SLP LONG TERM GOAL #4   Title  Pt will complete HEP for MTD with occasional min A over 3 sessions    Baseline  05/16/19;    Time  6    Period  Weeks    Status  On-going      SLP LONG TERM GOAL #5   Title  Pt will uitlize flow phonation in sentence responses 18/20 with occasional min A    Time  6    Period  Weeks    Status  On-going      SLP LONG TERM GOAL #6   Title  Pt will converse for 15 minutes in complex conversation with adequate breath support and WNL phonation with occasional min A    Time  6    Period  Weeks    Status  On-going       Plan - 05/16/19 1456    Clinical Impression Statement  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 Pt cont to have suboptimal completion of HEP outside ST sessions.  Pt continues to run out of air and report reduced intelligilbility with family and friends. Continue skiled ST to maximize voice and intelligibility.    Speech Therapy Frequency  2x / week    Duration  --   16 weeks or 35 visits   Treatment/Interventions  SLP instruction and feedback;Compensatory strategies;Functional tasks;Compensatory techniques;Internal/external aids;Patient/family education;Other (comment)    Potential to Achieve Goals  Good    Potential Considerations  Severity of impairments       Patient will benefit from skilled therapeutic intervention in order to improve the following deficits and impairments:   Other voice and resonance disorders    Problem List Patient Active Problem List    Diagnosis Date Noted  . Weight gain 04/15/2019  . Upper airway cough syndrome 02/02/2019  . History of ductal carcinoma in situ (DCIS) of breast  12/28/2018  . Pain of left breast 12/28/2018  . Mixed hyperlipidemia 10/28/2018  . Trigger finger, right middle finger 10/14/2018  . Dry mouth, unspecified 10/14/2018  . Allergic 07/15/2018  . Hoarseness of voice 07/15/2018  . Umbilical hernia 99991111  . Diastolic dysfunction 99991111  . Depression 11/07/2017  . Heart murmur 11/07/2017  . Obesity (BMI 30-39.9) 08/24/2016  . Type 2 diabetes mellitus with hyperlipidemia ([place]) 05/01/2016  . Colon polyps 07/17/2015  . Vitamin D deficiency 06/12/2015  . Ductal carcinoma in situ (DCIS) of left breast 02/15/2015  . Papilloma of right breast 02/15/2015  . Idiopathic chronic gout 11/23/2014  . Non-insulin dependent type 2 diabetes mellitus ([place]) 10/13/2013  . Internal derangement of knee 12/29/2012  . Osteoarthrosis involving lower leg 12/29/2012  . Osteopenia 03/15/2012  . Essential hypertension 06/20/2011  . Cough 06/20/2011    Jasmine Lopez, Annye Rusk MS, CCC-SLP 05/16/2019, 3:03 PM  [facility] [address] [place], [place], [address] Phone: [phone]   Fax:  [phone]   Name: Jasmine Lopez MRN: [id number] Date of Birth: Mar 30, 1941

## 2019-05-17 ENCOUNTER — Ambulatory Visit: Payer: Medicare PPO | Admitting: Neurology

## 2019-05-17 ENCOUNTER — Encounter: Payer: Self-pay | Admitting: Neurology

## 2019-05-17 ENCOUNTER — Ambulatory Visit: Payer: Medicare Other | Admitting: Neurology

## 2019-05-17 VITALS — BP 146/68 | HR 65 | Ht 64.0 in | Wt 201.8 lb

## 2019-05-17 DIAGNOSIS — R413 Other amnesia: Secondary | ICD-10-CM | POA: Diagnosis not present

## 2019-05-17 NOTE — Progress Notes (Signed)
PATIENT: Jasmine Lopez DOB: May 21, 1940  Chief Complaint  Patient presents with  . New Patient (Initial Visit)    Rm hall. Son   . Memory Loss, cognitive impairment     HISTORICAL  Jasmine Lopez is a 79 year old female, seen in request by his primary care physician Dr.Kramer, Jonelle Sidle for evaluation of mild cognitive impairment, he is accompanied by his son at today's clinical visit on May 17, 2019.  I have reviewed and summarized the referring note from the referring physician.  She has past medical history of hypertension, hyperlipidemia, diabetes, left breast calcified mass surgery,  She retired as a Management consultant at age 67, she went through excessive stress in early 2019, her husband, and her mother passed away few months apart, she moved from [place] to [place] to be close to her son, she lives independently, still driving, tends to rely on her GPS, she has good appetite, has trouble sleeping, tends to be sedentary,  She was noted to have gradual onset mild cognitive malfunction around 2016, she has word finding difficulties, sometimes difficulty to carry on her train of thoughts, only has mild memory difficulty noted.  There was no family history of memory loss  Laboratory evaluations in September 2020 showed normal CBC, CMP showed elevated glucose 136, LDL 132, normal thyroid functional test, A1c was 6.9  REVIEW OF SYSTEMS: Full 14 system review of systems performed and notable only for as above All other review of systems were negative.  ALLERGIES: Allergies  Allergen Reactions  . Ace Inhibitors Cough  . Atorvastatin Other (See Comments)    Leg pain   . Benadryl [Diphenhydramine] Itching    Benadryl cream    HOME MEDICATIONS: Current Outpatient Medications  Medication Sig Dispense Refill  . acetaminophen (TYLENOL) 500 MG tablet Take 500 mg by mouth every 6 (six) hours as needed.    Marland Kitchen allopurinol (ZYLOPRIM) 100 MG tablet TAKE 1 TABLET BY MOUTH  EVERY DAY 90 tablet 0  . amLODipine (NORVASC) 10 MG tablet Take 1 tablet (10 mg total) by mouth daily. 90 tablet 3  . amoxicillin (AMOXIL) 500 MG tablet Take 4 tablets by mouth 1 hour prior to dental procedure 4 tablet 0  . aspirin 325 MG EC tablet Take 325 mg by mouth every 4 (four) hours as needed for pain.    . benzonatate (TESSALON) 200 MG capsule Take 1 capsule (200 mg total) by mouth 3 (three) times daily as needed for cough. 45 capsule 11  . blood glucose meter kit and supplies KIT Dispense based on patient and insurance preference. Use up to two times daily as directed. (FOR ICD-9 250.00, 250.01). 1 each 0  . carvedilol (COREG) 6.25 MG tablet TAKE 1 TABLET BY MOUTH TWICE A DAY 180 tablet 3  . Cholecalciferol (VITAMIN D3) 2000 units TABS Take by mouth.    . famotidine (PEPCID) 20 MG tablet One after supper 60 tablet 11  . Garlic 7412 MG CAPS Take 1 capsule by mouth daily.    Marland Kitchen glucose blood (ONE TOUCH ULTRA TEST) test strip TEST FASTING BLOOD SUGAR AND AND 2 HOURS AFTER MEALS    . metFORMIN (GLUCOPHAGE) 500 MG tablet Take 500 mg by mouth daily.    Jasmine Lopez DELICA LANCETS 87O MISC TEST GLUCOSE DAILY FASTING AND 2 HOURS AFTER MEALS    . Biotin 10000 MCG TBDP 1 qd    . hydrochlorothiazide (HYDRODIURIL) 25 MG tablet Take 1 tablet (25 mg total) by mouth daily. Jasmine Lopez  tablet 3  . losartan (COZAAR) 100 MG tablet Take 1 tablet (100 mg total) by mouth daily. 90 tablet 3  . rosuvastatin (CRESTOR) 20 MG tablet Take 1 tablet (20 mg total) by mouth daily. (Patient not taking: Reported on 05/17/2019) 30 tablet 5   No current facility-administered medications for this visit.    PAST MEDICAL HISTORY: Past Medical History:  Diagnosis Date  . Diabetes mellitus without complication ([place])   . Hypertension   . Irregular heart beat     PAST SURGICAL HISTORY: Past Surgical History:  Procedure Laterality Date  . BREAST EXCISIONAL BIOPSY Bilateral     FAMILY HISTORY: Family History  Problem Relation  Age of Onset  . Heart failure Mother   . COPD Mother   . Cancer Father   . Rectal cancer Maternal Grandmother   . Heart attack Maternal Grandfather     SOCIAL HISTORY: Social History   Socioeconomic History  . Marital status: Widowed    Spouse name: Not on file  . Number of children: Not on file  . Years of education: Not on file  . Highest education level: Not on file  Occupational History  . Occupation: retired  Tobacco Use  . Smoking status: Never Smoker  . Smokeless tobacco: Never Used  Substance and Sexual Activity  . Alcohol use: Never  . Drug use: Never  . Sexual activity: Not Currently  Other Topics Concern  . Not on file  Social History Narrative   Lives home alone.  Widowed.  Retired.  MS counseling education. 4 cups coffee/ wk.     Social Determinants of Health   Financial Resource Strain: Low Risk   . Difficulty of Paying Living Expenses: Not hard at all  Food Insecurity: No Food Insecurity  . Worried About Charity fundraiser in the Last Year: Never true  . Ran Out of Food in the Last Year: Never true  Transportation Needs: No Transportation Needs  . Lack of Transportation (Medical): No  . Lack of Transportation (Non-Medical): No  Physical Activity: Insufficiently Active  . Days of Exercise per Week: 2 days  . Minutes of Exercise per Session: 30 min  Stress: No Stress Concern Present  . Feeling of Stress : Not at all  Social Connections:   . Frequency of Communication with Friends and Family: Not on file  . Frequency of Social Gatherings with Friends and Family: Not on file  . Attends Religious Services: Not on file  . Active Member of Clubs or Organizations: Not on file  . Attends Archivist Meetings: Not on file  . Marital Status: Not on file  Intimate Partner Violence: Not At Risk  . Fear of Current or Ex-Partner: No  . Emotionally Abused: No  . Physically Abused: No  . Sexually Abused: No     PHYSICAL EXAM   Vitals:   05/17/19  1307  BP: (!) 146/68  Pulse: 65  SpO2: 98%  Weight: 201 lb 12.8 oz (91.5 kg)  Height: _0  (1.626 m)    Not recorded      Body mass index is 34.64 kg/m.  PHYSICAL EXAMNIATION:  Gen: NAD, conversant, well nourised, well groomed                     Cardiovascular: Regular rate rhythm, no peripheral edema, warm, nontender. Eyes: Conjunctivae clear without exudates or hemorrhage Neck: Supple, no carotid bruits. Pulmonary: Clear to auscultation bilaterally   NEUROLOGICAL EXAM:  MMSE -  Mini Mental State Exam 05/17/2019  Orientation to time 5  Orientation to Place 5  Registration 3  Attention/ Calculation 2  Recall 3  Language- name 2 objects 2  Language- repeat 1  Language- follow 3 step command 3  Language- read & follow direction 1  Write a sentence 1  Copy design 0  Total score 26  Animal naming 18   CRANIAL NERVES: CN II: Visual fields are full to confrontation. Pupils are round equal and briskly reactive to light. CN III, IV, VI: extraocular movement are normal. No ptosis. CN V: Facial sensation is intact to light touch CN VII: Face is symmetric with normal eye closure  CN VIII: Hearing is normal to causal conversation. CN IX, X: Phonation is normal. CN XI: Head turning and shoulder shrug are intact  MOTOR: There is no pronator drift of out-stretched arms. Muscle bulk and tone are normal. Muscle strength is normal.  REFLEXES: Reflexes are 2+ and symmetric at the biceps, triceps, knees, and ankles. Plantar responses are flexor.  SENSORY: Intact to light touch, pinprick and vibratory sensation are intact in fingers and toes.  COORDINATION: There is no trunk or limb dysmetria noted.  GAIT/STANCE: She needs push-up to get up from seated position, steady Romberg is absent.   DIAGNOSTIC DATA (LABS, IMAGING, TESTING) - I reviewed patient records, labs, notes, testing and imaging myself where available.   ASSESSMENT AND PLAN  Jasmine Lopez is a 79 y.o.  female   Mild cognitive impairment  Stress, mood disorder related, versus early onset central nervous system degenerative disorder  MRI of brain  Laboratory evaluation including B12 level  Encouraged her moderate exercise  Marcial Pacas, M.D. Ph.D.  [facility] [address] [place], [place] [address] Ph: [phone] Fax: [phone]  CC: Referring Provider

## 2019-05-18 ENCOUNTER — Ambulatory Visit: Payer: Medicare PPO | Admitting: Speech Pathology

## 2019-05-18 ENCOUNTER — Other Ambulatory Visit: Payer: Self-pay

## 2019-05-18 DIAGNOSIS — R498 Other voice and resonance disorders: Secondary | ICD-10-CM | POA: Diagnosis not present

## 2019-05-18 LAB — VITAMIN B12: Vitamin B-12: 670 pg/mL (ref 232–1245)

## 2019-05-18 NOTE — Patient Instructions (Signed)
Jasmine Lopez - please complete your bubbles, gargles and straw voicing at least twice a day as prescribed in your previous handouts  In addition to your other vowels and phrases, say each 3x in flow voice twice a day I have underlined the flow sounds you should focus on  [name] is coming tomorrow   [name] is a Pharmacist, hospital in [place] is an Environmental consultant principal at [employer] came to visit on Sunday  Zebras zig zag at the zoo  See Gay Filler sleep soundly  [name] vowed to vote  You can practice flow in conversation with family - let them know what you are doing

## 2019-05-18 NOTE — Therapy (Signed)
[facility] [address] [place], [place], [address] Phone: [phone]   Fax:  [phone]  Speech Language Pathology Treatment  Patient Details  Name: Jasmine Lopez MRN: [id number] Date of Birth: [birth year] Referring Provider (SLP): Dr. Christinia Gully   Encounter Date: 05/18/2019  End of Session - 05/18/19 1506    Visit Number  17    Number of Visits  35    Date for SLP Re-Evaluation  06/20/19    SLP Start Time  [id number]    SLP Stop Time   1444    SLP Time Calculation (min)  41 min    Activity Tolerance  Patient tolerated treatment well       Past Medical History:  Diagnosis Date  . Diabetes mellitus without complication ([place])   . Hypertension   . Irregular heart beat     Past Surgical History:  Procedure Laterality Date  . BREAST EXCISIONAL BIOPSY Bilateral     There were no vitals filed for this visit.  Subjective Assessment - 05/18/19 1446    Subjective  "I went to the neurologist yesterday, she said I'm on the border" re: MoCA    Currently in Pain?  No/denies         Speech Therapy Telehealth Visit:  I connected with Jovonna Olds today at 14:00 by secure, live face-to-face video conference and verified that I am speaking with the correct person using two identifiers. I discussed the limitations, risks, security and privacy concerns of performing a video visit. I also discussed with the patient or legal guardian that there may be a patient responsible charge related to this service. The patient or legal guardian expressed understanding and verbal consent was obtained by [name].  The patient's address was confirmed.  Identified to the patient that therapist is a licensed Psychologist, clinical in the state of [place].  Verified phone # as [phone] to call in case of technical difficulties.       ADULT SLP TREATMENT - 05/18/19 1447      General Information   Behavior/Cognition   Alert;Cooperative;Pleasant mood      Treatment Provided   Treatment provided  Cognitive-Linquistic      Cognitive-Linquistic Treatment   Treatment focused on  Voice;Patient/family/caregiver education    Skilled Treatment  Pt is completing HEP for voice somewhat inconsistently at home. She demonstrated HEP with rare min A today. Focused on flow phonation at sentence and structured conversation level after warming up with vowles and fricative syllables. Generate personal sentences for [place] to practice. Sara utilized flow phonation in structured tasks. She stated "I sound like my self when I talk this way. This is how I want to sound." In structured description task, Heniretta maintained flow phonation and she ID'd and self corrected 2x when she went into pressed speech. In conversation, she conitnues to require consistent verbal cues to carryover flow phonation.       Assessment / Recommendations / Plan   Plan  Continue with current plan of care      Progression Toward Goals   Progression toward goals  Progressing toward goals       SLP Education - 05/18/19 1503    Education Details  Added flow phonation at sentence level to HEP; keep doing HEP 2x a day at least    Person(s) Educated  Patient    Methods  Explanation    Comprehension  Verbalized understanding;Returned demonstration;Verbal cues required;Need further instruction  SLP Short Term Goals - 05/18/19 1505      SLP SHORT TERM GOAL #1   Title  Pt will converse for 8 minutes using abdominal breathing with occasional min A over 2 sessions    Baseline  03/07/19; 03/14/19    Time  2    Period  Weeks    Status  Achieved      SLP SHORT TERM GOAL #2   Title  Pt will report successful achievement of relaxation techniques 2x outside of therpay sessions over 3 sessions    Baseline  03/07/19;    Time  2    Period  Weeks    Status  Achieved      SLP SHORT TERM GOAL #3   Title  Pt will report decreased frequency of VCD  symptoms outside of therapy session by 25% subjectively    Status  Achieved       SLP Long Term Goals - 05/18/19 1505      SLP LONG TERM GOAL #1   Title  Pt will demonstrate or report use of abdominal breathing with sniff/blow to reduce VCD episode with occasional min A    Baseline  03/07/19; 03/14/19, 03-25-19, 04/04/19    Time  3    Period  Weeks    Status  Achieved      SLP LONG TERM GOAL #2   Title  Pt will converse for 12 minutes with abdominal breathing with rare min A    Time  5    Period  Weeks    Status  On-going      SLP LONG TERM GOAL #3   Title  Pt will report decreased frequency of VCD symptoms outside of therapy room by 75%    Status  Achieved   upgraded from 50% to 75%     SLP LONG TERM GOAL #4   Title  Pt will complete HEP for MTD with occasional min A over 3 sessions    Baseline  05/16/19; 05/18/19    Time  6    Period  Weeks    Status  On-going      SLP LONG TERM GOAL #5   Title  Pt will uitlize flow phonation in sentence responses 18/20 with occasional min A    Time  6    Period  Weeks    Status  On-going      SLP LONG TERM GOAL #6   Title  Pt will converse for 15 minutes in complex conversation with adequate breath support and WNL phonation with occasional min A    Time  6    Period  Weeks    Status  On-going       Plan - 05/18/19 1503    Clinical Impression Statement  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 Pt cont to have suboptimal completion of HEP outside ST sessions.  Ikran has progressed with using flow phonation to reduce MTD from vowels to sentence level. She continues to required consistent cueing to carryover flow phonation to conversation. Pt continues to run out of air and report reduced intelligilbility with family and friends. Continue skiled ST to maximize voice and intelligibility.    Speech Therapy Frequency  2x / week    Treatment/Interventions  SLP instruction and  feedback;Compensatory strategies;Functional tasks;Compensatory techniques;Internal/external aids;Patient/family education;Other (comment)    Potential to Achieve Goals  Good    Potential Considerations  Severity of impairments  Patient will benefit from skilled therapeutic intervention in order to improve the following deficits and impairments:   Other voice and resonance disorders    Problem List Patient Active Problem List   Diagnosis Date Noted  . Memory loss 05/17/2019  . Weight gain 04/15/2019  . Upper airway cough syndrome 02/02/2019  . History of ductal carcinoma in situ (DCIS) of breast 12/28/2018  . Pain of left breast 12/28/2018  . Mixed hyperlipidemia 10/28/2018  . Trigger finger, right middle finger 10/14/2018  . Dry mouth, unspecified 10/14/2018  . Allergic 07/15/2018  . Hoarseness of voice 07/15/2018  . Umbilical hernia [id number]  . Diastolic dysfunction 99991111  . Depression 11/07/2017  . Heart murmur 11/07/2017  . Obesity (BMI 30-39.9) 08/24/2016  . Type 2 diabetes mellitus with hyperlipidemia ([place]) 05/01/2016  . Colon polyps 07/17/2015  . Vitamin D deficiency 06/12/2015  . Ductal carcinoma in situ (DCIS) of left breast 02/15/2015  . Papilloma of right breast 02/15/2015  . Idiopathic chronic gout 11/23/2014  . Non-insulin dependent type 2 diabetes mellitus ([place]) 10/13/2013  . Internal derangement of knee 12/29/2012  . Osteoarthrosis involving lower leg 12/29/2012  . Osteopenia 03/15/2012  . Essential hypertension 06/20/2011  . Cough 06/20/2011    Daxon Kyne, Annye Rusk MS, CCC-SLP 05/18/2019, 3:06 PM  [facility] [address] [place], [place], [address] Phone: [phone]   Fax:  [phone]   Name: VANEZA TIEMEYER MRN: [id number] Date of Birth: 1940-06-06

## 2019-05-19 ENCOUNTER — Ambulatory Visit: Payer: Medicare PPO | Admitting: Internal Medicine

## 2019-05-19 VITALS — BP 140/82 | HR 63 | Temp 98.4°F | Ht 63.2 in | Wt 198.6 lb

## 2019-05-19 DIAGNOSIS — E782 Mixed hyperlipidemia: Secondary | ICD-10-CM | POA: Diagnosis not present

## 2019-05-19 DIAGNOSIS — R4189 Other symptoms and signs involving cognitive functions and awareness: Secondary | ICD-10-CM

## 2019-05-19 DIAGNOSIS — M25561 Pain in right knee: Secondary | ICD-10-CM

## 2019-05-19 DIAGNOSIS — I10 Essential (primary) hypertension: Secondary | ICD-10-CM

## 2019-05-19 MED ORDER — AMOXICILLIN 500 MG PO TABS: ORAL_TABLET | ORAL | 0 refills | Status: DC

## 2019-05-19 NOTE — Progress Notes (Signed)
This visit occurred during the SARS-CoV-2 public health emergency.  Safety protocols were in place, including screening questions prior to the visit, additional usage of staff PPE, and extensive cleaning of exam room while observing appropriate contact time as indicated for disinfecting solutions.  Subjective:     Patient ID: Jasmine Lopez , female    DOB: 03-30-1941 , 79 y.o.   MRN: [id number]   Chief Complaint  Patient presents with  . Hypertension  . Knee Pain    rt knee pain 4 months   . Memory Loss  . Diabetes    HPI  1- questions about her cognitive test she had done with Neurologist     05/17/2119  2- dell on her R knee when she was leaning over and her Re foot slipped a little and she fell on her R knee and got up and walked in the house and no one knew she had fallen. The pain resolved that day, then 2-3 weeks later when she was getting into her son's SUV and felt strange sensation. But in the am she feels something.   Past Medical History:  Diagnosis Date  . Diabetes mellitus without complication ([place])   . Hypertension   . Irregular heart beat      Family History  Problem Relation Age of Onset  . Heart failure Mother   . COPD Mother   . Cancer Father   . Rectal cancer Maternal Grandmother   . Heart attack Maternal Grandfather      Current Outpatient Medications:  .  acetaminophen (TYLENOL) 500 MG tablet, Take 500 mg by mouth every 6 (six) hours as needed., Disp: , Rfl:  .  allopurinol (ZYLOPRIM) 100 MG tablet, TAKE 1 TABLET BY MOUTH EVERY DAY, Disp: 90 tablet, Rfl: 0 .  amLODipine (NORVASC) 10 MG tablet, Take 1 tablet (10 mg total) by mouth daily., Disp: 90 tablet, Rfl: 3 .  amoxicillin (AMOXIL) 500 MG tablet, Take 4 tablets by mouth 1 hour prior to dental procedure, Disp: 4 tablet, Rfl: 0 .  aspirin 325 MG EC tablet, Take 325 mg by mouth every 4 (four) hours as needed for pain., Disp: , Rfl:  .  Biotin 10000 MCG TBDP, 1 qd, Disp: , Rfl:  .  blood glucose  meter kit and supplies KIT, Dispense based on patient and insurance preference. Use up to two times daily as directed. (FOR ICD-9 250.00, 250.01)., Disp: 1 each, Rfl: 0 .  carvedilol (COREG) 6.25 MG tablet, TAKE 1 TABLET BY MOUTH TWICE A DAY, Disp: 180 tablet, Rfl: 3 .  Cholecalciferol (VITAMIN D3) 2000 units TABS, Take by mouth., Disp: , Rfl:  .  famotidine (PEPCID) 20 MG tablet, One after supper, Disp: 60 tablet, Rfl: 11 .  Garlic 4174 MG CAPS, Take 1 capsule by mouth daily., Disp: , Rfl:  .  glucose blood (ONE TOUCH ULTRA TEST) test strip, TEST FASTING BLOOD SUGAR AND AND 2 HOURS AFTER MEALS, Disp: , Rfl:  .  hydrochlorothiazide (HYDRODIURIL) 25 MG tablet, Take 1 tablet (25 mg total) by mouth daily., Disp: 90 tablet, Rfl: 3 .  losartan (COZAAR) 100 MG tablet, Take 1 tablet (100 mg total) by mouth daily., Disp: 90 tablet, Rfl: 3 .  metFORMIN (GLUCOPHAGE) 500 MG tablet, Take 500 mg by mouth daily., Disp: , Rfl:  .  ONETOUCH DELICA LANCETS 08X MISC, TEST GLUCOSE DAILY FASTING AND 2 HOURS AFTER MEALS, Disp: , Rfl:  .  benzonatate (TESSALON) 200 MG capsule, Take 1 capsule (200  mg total) by mouth 3 (three) times daily as needed for cough. (Patient not taking: Reported on 05/19/2019), Disp: 45 capsule, Rfl: 11 .  rosuvastatin (CRESTOR) 20 MG tablet, Take 1 tablet (20 mg total) by mouth daily. (Patient not taking: Reported on 05/17/2019), Disp: 30 tablet, Rfl: 5   Allergies  Allergen Reactions  . Ace Inhibitors Cough  . Atorvastatin Other (See Comments)    Leg pain   . Benadryl [Diphenhydramine] Itching    Benadryl cream     Review of Systems  Working with Electrical engineer for voice since got injured from coughing so much for a long time. Admits of wt gain. + for R knee pain.  Today's Vitals   05/19/19 1116  BP: 140/82  Pulse: 63  Temp: 98.4 F (36.9 C)  Weight: 198 lb 9.6 oz (90.1 kg)  Height: 5' 3.2" (1.605 m)   Body mass index is 34.96 kg/m.   Objective:  Physical Exam Vitals and  nursing note reviewed.  Constitutional:      General: She is not in acute distress.    Appearance: She is obese. She is not toxic-appearing.  HENT:     Right Ear: Ear canal normal.     Left Ear: Ear canal normal.     Nose: Nose normal.  Eyes:     General: No scleral icterus.    Conjunctiva/sclera: Conjunctivae normal.  Cardiovascular:     Rate and Rhythm: Normal rate and regular rhythm.     Pulses: Normal pulses.  Pulmonary:     Effort: Pulmonary effort is normal.     Breath sounds: Normal breath sounds.  Musculoskeletal:        General: Tenderness present. No swelling, deformity or signs of injury.     Cervical back: Neck supple.     Right lower leg: No edema.     Left lower leg: No edema.     Comments: R KNEE- No swelling or effusion noted, has tenderness of medial joint line area and lateral knee area but more posterior. ROM reveals crepitations. No signs of masses palpated on back of her R knee. ROM is normal, but flexion > 90 degrees provokes pain.   Skin:    General: Skin is warm and dry.     Findings: No bruising, erythema, lesion or rash.  Neurological:     Mental Status: She is alert and oriented to person, place, and time.     Motor: No weakness.     Gait: Gait abnormal.     Comments: Has a limp when she walks  Psychiatric:        Mood and Affect: Mood normal.        Behavior: Behavior normal.        Judgment: Judgment normal.         Assessment And Plan:     1. Acute pain of right knee- chronic. Advised to apply Voltaren gel prn pain.  - DG Knee Complete 4 Views Right; Future If not improving with Voltaren gel, then may see ortho at [facility].   2. Mixed hyperlipidemia- chronic. She never started the Crestor, so will get on it and will Fu in April for labs.   3. Impaired cognition- new. SSME score was 26. Her neurologist has ordered brain MRI and will continue Fu with her.   4. Essential hypertension- stable. May continue same med.    Jasmine Jeane  [facility]RIGUE[facility], PA-C    THE PATIENT IS ENCOURAGED TO PRACTICE SOCIAL DISTANCING DUE TO  THE COVID-19 PANDEMIC.

## 2019-05-20 ENCOUNTER — Ambulatory Visit
Admission: RE | Admit: 2019-05-20 | Discharge: 2019-05-20 | Disposition: A | Discharge status: Home / Self Care | Payer: Medicare PPO | Source: Ambulatory Visit | Attending: Internal Medicine | Admitting: Internal Medicine

## 2019-05-20 DIAGNOSIS — M25561 Pain in right knee: Secondary | ICD-10-CM

## 2019-05-23 ENCOUNTER — Ambulatory Visit: Payer: Medicare PPO | Admitting: Speech Pathology

## 2019-05-23 ENCOUNTER — Other Ambulatory Visit: Payer: Self-pay

## 2019-05-23 DIAGNOSIS — R498 Other voice and resonance disorders: Secondary | ICD-10-CM

## 2019-05-23 NOTE — Patient Instructions (Signed)
  I sent an exercise log to [facility]. He can print you a few copies.  Here are some sentences to add to your practice since you are doing so well. Focus on the flow sounds (ch, sh, s, z, p, m, n)  Teachers eat rip peaches at the [place] at the [place] should polish your new shoes  You may choose to remain detached  Laurey Arrow bought an apple pie  I told [name] you can practice with him and he should expect that you will talk a little slower while you get used to talking in flow speech.   Remember - throat clear = 5 relaxed sniff blows   I know it doesn't feel natural to converse with flow speech, but it is much less distracting to your listener than strained and froggy pressed speech  Keep straws around so you can hum in the straw during commercials or waiting for food to heat or waiting for a visitor etc. Exercises don't have to be done all at once.   Honey, you sounded great today!!! You used flow speech for 45 minutes and you corrected yourself when you went into pressed speech - super job!

## 2019-05-25 ENCOUNTER — Other Ambulatory Visit: Payer: Self-pay

## 2019-05-25 ENCOUNTER — Ambulatory Visit: Payer: Medicare PPO | Admitting: Speech Pathology

## 2019-05-25 ENCOUNTER — Encounter: Payer: Self-pay | Admitting: Speech Pathology

## 2019-05-25 DIAGNOSIS — R498 Other voice and resonance disorders: Secondary | ICD-10-CM | POA: Diagnosis not present

## 2019-05-25 NOTE — Therapy (Signed)
[facility] [address] [place], [place], [address] Phone: [phone]   Fax:  [phone]  Speech Language Pathology Treatment  Patient Details  Name: Jasmine Lopez MRN: [id number] Date of Birth: 1940-11-06 Referring Provider (SLP): Dr. Christinia Gully   Encounter Date: 05/23/2019  End of Session - 05/25/19 0828    Visit Number  18       Past Medical History:  Diagnosis Date  . Diabetes mellitus without complication ([place])   . Hypertension   . Irregular heart beat     Past Surgical History:  Procedure Laterality Date  . BREAST EXCISIONAL BIOPSY Bilateral     There were no vitals filed for this visit.  Subjective Assessment - 05/25/19 0816    Subjective  "I have been practicing"    Currently in Pain?  No/denies        Speech Therapy Telehealth Visit:  I connected with Jasmine Lopez today by secure, live face-to-face video conference and verified that I am speaking with the correct person using two identifiers. I discussed the limitations, risks, security and privacy concerns of performing a video visit. I also discussed with the patient or legal guardian that there may be a patient responsible charge related to this service. The patient or legal guardian expressed understanding and verbal consent was obtained by Jasmine Lopez.  The patient's address was confirmed.  Identified to the patient that therapist is a licensed Psychologist, clinical in the state of [place].  Verified phone # as [phone] to call in case of technical difficulties.          ADULT SLP TREATMENT - 05/25/19 0817      General Information   Behavior/Cognition  Alert;Cooperative;Pleasant mood      Treatment Provided   Treatment provided  Cognitive-Linquistic      Cognitive-Linquistic Treatment   Treatment focused on  Voice;Patient/family/caregiver education    Skilled Treatment  Jasmine Lopez reports completing HEP daily,  but not twice a day consistently. I provided her, via email, a check off log for her HEP to assist her in keeping track and facilitate consistent completion of HEP. Recalibrated voice with articulated fricative syllables and phrases with flow phonatoin with occasional min A. Jasmine Lopez participated in 20 minute simple conversation utilizing flow phonation with occasional min A.  Jasmine Lopez stated, "I'm getting back to my normal tone." [name] also states that Jasmine Lopez feels "slow' and that others may question her "mental status" if Jasmine Lopez talks slow due to focusing on flow phonation. I demonstrated pressed phonation, then flow phonatoin. Jasmine Lopez affirms that pressed phonation is more distracting to the listener. Jasmine Lopez agreed to practice flow phonation with her son. I  email her son, Jasmine Lopez, explaining that Jasmine Lopez needs to practice conversation with her strategies to facilitate carryover. I explained that Jasmine Lopez may talk slower as Jasmine Lopez is focusing on learning a new way of using her voice, to [place] in feeling more comfortable practicing with family.      Progression Toward Goals   Progression toward goals  Progressing toward goals       SLP Education - 05/25/19 0824    Education Details  added to HEP; practice with family; compensations for MTD    Person(s) Educated  Patient    Methods  Explanation;Demonstration;Verbal cues;Handout       SLP Short Term Goals - 05/25/19 0827      SLP SHORT TERM GOAL #1   Title  Pt will converse for 8 minutes using  abdominal breathing with occasional min A over 2 sessions    Baseline  03/07/19; 03/14/19    Time  2    Period  Weeks    Status  Achieved      SLP SHORT TERM GOAL #2   Title  Pt will report successful achievement of relaxation techniques 2x outside of therpay sessions over 3 sessions    Baseline  03/07/19;    Time  2    Period  Weeks    Status  Achieved      SLP SHORT TERM GOAL #3   Title  Pt will report decreased frequency of VCD symptoms outside of  therapy session by 25% subjectively    Status  Achieved       SLP Long Term Goals - 05/25/19 0827      SLP LONG TERM GOAL #1   Title  Pt will demonstrate or report use of abdominal breathing with sniff/blow to reduce VCD episode with occasional min A    Baseline  03/07/19; 03/14/19, 03-25-19, 04/04/19    Time  3    Period  Weeks    Status  Achieved      SLP LONG TERM GOAL #2   Title  Pt will converse for 12 minutes with abdominal breathing with rare min A    Time  5    Period  Weeks    Status  Partially Met      SLP LONG TERM GOAL #3   Title  Pt will report decreased frequency of VCD symptoms outside of therapy room by 75%    Status  Achieved   upgraded from 50% to 75%     SLP LONG TERM GOAL #4   Title  Pt will complete HEP for MTD with occasional min A over 3 sessions    Baseline  05/16/19; 05/18/19; 05/23/19    Time  6    Period  Weeks    Status  Achieved      SLP LONG TERM GOAL #5   Title  Pt will uitlize flow phonation in sentence responses 18/20 with occasional min A    Time  6    Period  Weeks    Status  Achieved      SLP LONG TERM GOAL #6   Title  Pt will converse for 15 minutes in complex conversation with adequate breath support and WNL phonation with occasional min A    Time  4    Period  Weeks    Status  On-going       Plan - 05/25/19 0824    Clinical Impression Statement  Jasmine Lopez has progressed to using flow phonation to achieve WNL voice at sentence and simple conversation level with ST. Jasmine Lopez conitnues to report completion of HEP 1x a day. Ideally, Jasmine Lopez would complete HEP twice a day. Jasmine Lopez requires rare to occasional min A to maintain flow phonatoin. Recommend continue skilled ST to maximize carryover of compensations with family and friends to maintain normal phonation and maximize intelligilbity.    Speech Therapy Frequency  2x / week    Duration  --   16 weeks or 35 visits   Treatment/Interventions  SLP instruction and feedback;Compensatory  strategies;Functional tasks;Compensatory techniques;Internal/external aids;Patient/family education;Other (comment)    Potential Considerations  Severity of impairments       Patient will benefit from skilled therapeutic intervention in order to improve the following deficits and impairments:   Other voice and resonance disorders    Problem List Patient  Active Problem List   Diagnosis Date Noted  . Memory loss 05/17/2019  . Weight gain 04/15/2019  . Upper airway cough syndrome 02/02/2019  . History of ductal carcinoma in situ (DCIS) of breast 12/28/2018  . Pain of left breast 12/28/2018  . Mixed hyperlipidemia 10/28/2018  . Trigger finger, right middle finger 10/14/2018  . Dry mouth, unspecified 10/14/2018  . Allergic 07/15/2018  . Hoarseness of voice 07/15/2018  . Umbilical hernia 60/60/0459  . Diastolic dysfunction 97/74/1423  . Depression 11/07/2017  . Heart murmur 11/07/2017  . Obesity (BMI 30-39.9) 08/24/2016  . Type 2 diabetes mellitus with hyperlipidemia ([place]) 05/01/2016  . Colon polyps 07/17/2015  . Vitamin D deficiency 06/12/2015  . Ductal carcinoma in situ (DCIS) of left breast 02/15/2015  . Papilloma of right breast 02/15/2015  . Idiopathic chronic gout 11/23/2014  . Non-insulin dependent type 2 diabetes mellitus ([place]) 10/13/2013  . Internal derangement of knee 12/29/2012  . Osteoarthrosis involving lower leg 12/29/2012  . Osteopenia 03/15/2012  . Essential hypertension 06/20/2011  . Cough 06/20/2011   Georgiann Hahn MS, CCC-SLP 05/25/2019, 8:29 AM  [facility] [address] [place], [place], [address] Phone: [phone]   Fax:  [phone]   Name: EBONEY CLAYBROOK MRN: [id number] Date of Birth: [birth year]

## 2019-05-26 ENCOUNTER — Telehealth: Payer: Self-pay

## 2019-05-26 ENCOUNTER — Ambulatory Visit
Admission: RE | Admit: 2019-05-26 | Discharge: 2019-05-26 | Disposition: A | Discharge status: Home / Self Care | Payer: Medicare PPO | Source: Ambulatory Visit | Attending: Neurology | Admitting: Neurology

## 2019-05-26 ENCOUNTER — Other Ambulatory Visit: Payer: Self-pay | Admitting: Internal Medicine

## 2019-05-26 DIAGNOSIS — R413 Other amnesia: Secondary | ICD-10-CM

## 2019-05-26 DIAGNOSIS — M25561 Pain in right knee: Secondary | ICD-10-CM

## 2019-05-26 NOTE — Telephone Encounter (Signed)
Spoke w/pt gave her provider message   Rodriguez-[place], Sandrea Matte  Candiss Norse T, CMA  Please inform pt that her xray looks normal. I will send her to ortho.

## 2019-05-27 ENCOUNTER — Encounter: Payer: Self-pay | Admitting: Speech Pathology

## 2019-05-27 NOTE — Therapy (Signed)
[facility] [address] [place], [place], [address] Phone: [phone]   Fax:  [phone]  Speech Language Pathology Treatment  Patient Details  Name: Jasmine Lopez MRN: [id number] Date of Birth: 02-01-1941 Referring Provider (SLP): Dr. Christinia Gully   Encounter Date: 05/25/2019  End of Session - 05/27/19 1223    Visit Number  19    Number of Visits  35    Date for SLP Re-Evaluation  06/20/19    SLP Start Time  2376    SLP Stop Time   1441    SLP Time Calculation (min)  39 min    Activity Tolerance  Patient tolerated treatment well       Past Medical History:  Diagnosis Date  . Diabetes mellitus without complication ([place])   . Hypertension   . Irregular heart beat     Past Surgical History:  Procedure Laterality Date  . BREAST EXCISIONAL BIOPSY Bilateral     There were no vitals filed for this visit.  Subjective Assessment - 05/27/19 1215    Subjective  "Tommy hasn't gotten the log printed and to me yet"    Currently in Pain?  No/denies            ADULT SLP TREATMENT - 05/27/19 1216      General Information   Behavior/Cognition  Alert;Cooperative;Pleasant mood      Treatment Provided   Treatment provided  Cognitive-Linquistic      Cognitive-Linquistic Treatment   Treatment focused on  Voice;Patient/family/caregiver education    Skilled Treatment  Facilitated reduced tension in larynx and airflow for speech with flow phonation at syllable, word and sentence level to re-calibrate her voice. Jasmine Lopez reports practicing flow phonation with others. Today, increased laryngeal tension, with usual min to mod cues to speak in flow phonation at conversation level. When she looses focus on flow phonation, she reverts back to muscle tension dysphonia and "pressed" speech. Two throat clears today, which she required cues to notice them. I cued Jasmine Lopez to do short SOVTE (semiocculded vocal tract  exercises) 3x during conversation to reset vocal folds and reduce laryngeal tension. Instructed her to do this when she is having difficulty maintaining flow phonation or noting laryngeal tension. Encouraged her to keep straws and water around the house so she can do SOVTE anywhere      Assessment / Recommendations / [facility] with current plan of care      Progression Toward Goals   Progression toward goals  Progressing toward goals       SLP Education - 05/27/19 1221    Education Details  Do SOVTE throughout the day, when you are having difficulty using flow phonation or feel tension in your larynx    Person(s) Educated  Patient    Methods  Explanation;Demonstration;Verbal cues    Comprehension  Verbalized understanding;Returned demonstration;Verbal cues required;Need further instruction       SLP Short Term Goals - 05/27/19 1223      SLP SHORT TERM GOAL #1   Title  Pt will converse for 8 minutes using abdominal breathing with occasional min A over 2 sessions    Baseline  03/07/19; 03/14/19    Time  2    Period  Weeks    Status  Achieved      SLP SHORT TERM GOAL #2   Title  Pt will report successful achievement of relaxation techniques 2x outside of therpay sessions over 3 sessions  Baseline  03/07/19;    Time  2    Period  Weeks    Status  Achieved      SLP SHORT TERM GOAL #3   Title  Pt will report decreased frequency of VCD symptoms outside of therapy session by 25% subjectively    Status  Achieved       SLP Long Term Goals - 05/27/19 1223      SLP LONG TERM GOAL #1   Title  Pt will demonstrate or report use of abdominal breathing with sniff/blow to reduce VCD episode with occasional min A    Baseline  03/07/19; 03/14/19, 03-25-19, 04/04/19    Time  3    Period  Weeks    Status  Achieved      SLP LONG TERM GOAL #2   Title  Pt will converse for 12 minutes with abdominal breathing with rare min A    Time  5    Period  Weeks    Status  Partially Met       SLP LONG TERM GOAL #3   Title  Pt will report decreased frequency of VCD symptoms outside of therapy room by 75%    Status  Achieved   upgraded from 50% to 75%     SLP LONG TERM GOAL #4   Title  Pt will complete HEP for MTD with occasional min A over 3 sessions    Baseline  05/16/19; 05/18/19; 05/23/19    Time  6    Period  Weeks    Status  Achieved      SLP LONG TERM GOAL #5   Title  Pt will uitlize flow phonation in sentence responses 18/20 with occasional min A    Time  6    Period  Weeks    Status  Achieved      SLP LONG TERM GOAL #6   Title  Pt will converse for 15 minutes in complex conversation with adequate breath support and WNL phonation with occasional min A    Time  4    Period  Weeks    Status  On-going       Plan - 05/27/19 1222    Clinical Impression Statement  Jasmine Lopez has progressed to using flow phonation to achieve WNL voice at sentence and simple conversation level with ST. She conitnues to report completion of HEP 1x a day. Ideally, she would complete HEP twice a day. She requires rare to occasional min A to maintain flow phonatoin. Recommend continue skilled ST to maximize carryover of compensations with family and friends to maintain normal phonation and maximize intelligilbity.    Speech Therapy Frequency  2x / week    Duration  --   16 weeks or 15 visits   Treatment/Interventions  SLP instruction and feedback;Compensatory strategies;Functional tasks;Compensatory techniques;Internal/external aids;Patient/family education;Other (comment)    Potential to Achieve Goals  Good    Potential Considerations  Severity of impairments       Patient will benefit from skilled therapeutic intervention in order to improve the following deficits and impairments:   Other voice and resonance disorders    Problem List Patient Active Problem List   Diagnosis Date Noted  . Memory loss 05/17/2019  . Weight gain 04/15/2019  . Upper airway cough syndrome 02/02/2019   . History of ductal carcinoma in situ (DCIS) of breast 12/28/2018  . Pain of left breast 12/28/2018  . Mixed hyperlipidemia 10/28/2018  . Trigger finger, right middle finger 10/14/2018  .  Dry mouth, unspecified 10/14/2018  . Allergic 07/15/2018  . Hoarseness of voice 07/15/2018  . Umbilical hernia 39/76/7341  . Diastolic dysfunction 93/79/0240  . Depression 11/07/2017  . Heart murmur 11/07/2017  . Obesity (BMI 30-39.9) 08/24/2016  . Type 2 diabetes mellitus with hyperlipidemia ([place]) 05/01/2016  . Colon polyps 07/17/2015  . Vitamin D deficiency 06/12/2015  . Ductal carcinoma in situ (DCIS) of left breast 02/15/2015  . Papilloma of right breast 02/15/2015  . Idiopathic chronic gout 11/23/2014  . Non-insulin dependent type 2 diabetes mellitus ([place]) 10/13/2013  . Internal derangement of knee 12/29/2012  . Osteoarthrosis involving lower leg 12/29/2012  . Osteopenia 03/15/2012  . Essential hypertension 06/20/2011  . Cough 06/20/2011    Avy Barlett, Annye Rusk MS, CCC-SLP 05/27/2019, 12:24 PM  [facility] [address] [place], [place], [address] Phone: [phone]   Fax:  [phone]   Name: Jasmine Lopez MRN: [id number] Date of Birth: Jun 11, 1940

## 2019-05-30 ENCOUNTER — Ambulatory Visit: Payer: Medicare PPO | Admitting: Speech Pathology

## 2019-05-30 ENCOUNTER — Other Ambulatory Visit: Payer: Self-pay

## 2019-05-30 DIAGNOSIS — R498 Other voice and resonance disorders: Secondary | ICD-10-CM

## 2019-05-30 NOTE — Patient Instructions (Signed)
Keep up bubbles with and without voice; straw with voice; gargle and your flow phrases and sentences twice a day  Sticky notes on computer and table that say FLOW  When you are practicing flow over the phone, sit in one spot so you can focus on using flow, rather than walking around  Remember, when you feel post nasal drip or Vocal Cord Dysfunction symptoms, swallow hard then sniff blow until the feeling passes (no talking until it passes)  Please try to not clear your throat, but swallow hard for forceful blow and swallow to clear anything out of your throat.  Let [name] and Horris Latino know to alert you when you clear your throat.  Practice with [name] - teach her about flow and pressed speech  You are sounding much better!!! Keep up the good practicing!!

## 2019-05-30 NOTE — Therapy (Signed)
[facility] [address] [place], [place], [address] Phone: [phone]   Fax:  [phone]  Speech Language Pathology Treatment  Patient Details  Name: Jasmine Lopez MRN: [id number] Date of Birth: 1940-11-20 Referring Provider (SLP): Dr. Christinia Gully   Encounter Date: 05/30/2019  End of Session - 05/30/19 1504    Visit Number  20    Number of Visits  35    Date for SLP Re-Evaluation  06/20/19    SLP Start Time  1400    SLP Stop Time   1440    SLP Time Calculation (min)  40 min       Past Medical History:  Diagnosis Date  . Diabetes mellitus without complication ([place])   . Hypertension   . Irregular heart beat     Past Surgical History:  Procedure Laterality Date  . BREAST EXCISIONAL BIOPSY Bilateral     There were no vitals filed for this visit.  Subjective Assessment - 05/30/19 1453    Subjective  "I practice Flow speech with Jasmine Lopez (son) and Jasmine Lopez"    Currently in Pain?  No/denies        Speech Therapy Progress Note  Dates of Reporting Period: 02/14/2019  to 05/30/2019  Objective Reports of Subjective Statement: Pt has progressed to using WNL phonation with SLP and 2 family members with occasional cues required to maintain WNL phonation  Objective Measurements: Pt required 4 cues during session today to carryover and maintain flow phonation, however she used press phonation with glottal fry when speaking with our front office.  Goal Update: Continue goal  Plan: Continue POC  Reason Skilled Services are Required: Jasmine Lopez has made progress reducing VCD to less than 1 time a week. She has improved phonation with reduced laryngeal tension, however she has not carried over WNL phonation consistently in or outside of ST. Continue skilled ST to reduce muscle tension dysphonia and improve intelligibility across settings and situations.      ADULT SLP TREATMENT - 05/30/19 1454      General  Information   Behavior/Cognition  Alert;Cooperative;Pleasant mood      Treatment Provided   Treatment provided  Cognitive-Linquistic      Cognitive-Linquistic Treatment   Treatment focused on  Voice;Patient/family/caregiver education    Skilled Treatment  Jasmine Lopez reports she has been completing HEP for voice twice a day consistently. She has practice flow phonation with 2 family members. She states that they cue her when she is not using flow phonation and that "they say I sound so much better" when I speak correctly. Jasmine Lopez required occasional min cues to consistently use flow phonation in conversations today. She reports VCD sx yesterday and today with change in temperature and post nasal drip. She require re-instruction to not clear her throat, but to swallow hard to clear post nasal drip and to use sniff blow with no talking until the feeling passes. Educated her in the difference in VCD symptoms/behaviors vs using pressed speech. They are related, but have different strategies.       Assessment / Recommendations / Plan   Plan  Continue with current plan of care      Progression Toward Goals   Progression toward goals  Progressing toward goals       SLP Education - 05/30/19 1459    Education Details  Continue HEP 2x a day; place sticky notes as reminder to use flow phonation; keep practicing with family members    Person(s) Educated  Patient    Methods  Explanation;Demonstration;Verbal cues    Comprehension  Verbalized understanding;Returned demonstration;Verbal cues required       SLP Short Term Goals - 05/30/19 1503      SLP SHORT TERM GOAL #1   Title  Pt will converse for 8 minutes using abdominal breathing with occasional min A over 2 sessions    Baseline  03/07/19; 03/14/19    Time  2    Period  Weeks    Status  Achieved      SLP SHORT TERM GOAL #2   Title  Pt will report successful achievement of relaxation techniques 2x outside of therpay sessions over 3 sessions     Baseline  03/07/19;    Time  2    Period  Weeks    Status  Achieved      SLP SHORT TERM GOAL #3   Title  Pt will report decreased frequency of VCD symptoms outside of therapy session by 25% subjectively    Status  Achieved       SLP Long Term Goals - 05/30/19 1503      SLP LONG TERM GOAL #1   Title  Pt will demonstrate or report use of abdominal breathing with sniff/blow to reduce VCD episode with occasional min A    Baseline  03/07/19; 03/14/19, 03-25-19, 04/04/19    Time  3    Period  Weeks    Status  Achieved      SLP LONG TERM GOAL #2   Title  Pt will converse for 12 minutes with abdominal breathing with rare min A    Time  5    Period  Weeks    Status  Partially Met      SLP LONG TERM GOAL #3   Title  Pt will report decreased frequency of VCD symptoms outside of therapy room by 75%    Status  Achieved   upgraded from 50% to 75%     SLP LONG TERM GOAL #4   Title  Pt will complete HEP for MTD with occasional min A over 3 sessions    Baseline  05/16/19; 05/18/19; 05/23/19    Time  6    Period  Weeks    Status  Achieved      SLP LONG TERM GOAL #5   Title  Pt will uitlize flow phonation in sentence responses 18/20 with occasional min A    Time  6    Period  Weeks    Status  Achieved      SLP LONG TERM GOAL #6   Title  Pt will converse for 15 minutes in complex conversation with adequate breath support and WNL phonation with occasional min A    Time  4    Period  Weeks    Status  On-going       Plan - 05/30/19 1501    Clinical Impression Statement  Jasmine Lopez has progressed to using flow phonation to achieve WNL voice at sentence and simple conversation level with ST with verbal cues.  She has carried over use of flow phonation with 2 family members. She  report completion of HEP 2x a day.  She requires rare to occasional min A to maintain flow phonatoin. Recommend continue skilled ST to maximize carryover of compensations with family and friends to maintain normal  phonation and maximize intelligilbity.    Speech Therapy Frequency  2x / week    Duration  --   16 weeks or  35 visits   Treatment/Interventions  SLP instruction and feedback;Compensatory strategies;Functional tasks;Compensatory techniques;Internal/external aids;Patient/family education;Other (comment)    Potential to Achieve Goals  Good       Patient will benefit from skilled therapeutic intervention in order to improve the following deficits and impairments:   Other voice and resonance disorders    Problem List Patient Active Problem List   Diagnosis Date Noted  . Memory loss 05/17/2019  . Weight gain 04/15/2019  . Upper airway cough syndrome 02/02/2019  . History of ductal carcinoma in situ (DCIS) of breast 12/28/2018  . Pain of left breast 12/28/2018  . Mixed hyperlipidemia 10/28/2018  . Trigger finger, right middle finger 10/14/2018  . Dry mouth, unspecified 10/14/2018  . Allergic 07/15/2018  . Hoarseness of voice 07/15/2018  . Umbilical hernia 72/89/7915  . Diastolic dysfunction 08/22/6436  . Depression 11/07/2017  . Heart murmur 11/07/2017  . Obesity (BMI 30-39.9) 08/24/2016  . Type 2 diabetes mellitus with hyperlipidemia ([place]) 05/01/2016  . Colon polyps 07/17/2015  . Vitamin D deficiency 06/12/2015  . Ductal carcinoma in situ (DCIS) of left breast 02/15/2015  . Papilloma of right breast 02/15/2015  . Idiopathic chronic gout 11/23/2014  . Non-insulin dependent type 2 diabetes mellitus ([place]) 10/13/2013  . Internal derangement of knee 12/29/2012  . Osteoarthrosis involving lower leg 12/29/2012  . Osteopenia 03/15/2012  . Essential hypertension 06/20/2011  . Cough 06/20/2011    Jasmine Lopez, Jasmine Rusk MS, CCC-SLP 05/30/2019, 3:04 PM  [facility] [address] [place], [place], [address] Phone: [phone]   Fax:  [phone]   Name: ABBI MANCINI MRN: [id number] Date of Birth: 08-16-1940

## 2019-05-31 ENCOUNTER — Telehealth: Payer: Self-pay | Admitting: *Deleted

## 2019-05-31 NOTE — Telephone Encounter (Signed)
Left patient a detailed message, with results, on voicemail (ok per DPR).  Provided our number to call back with any questions.  

## 2019-05-31 NOTE — Telephone Encounter (Signed)
-----   Message from Penni Bombard, MD sent at 05/31/2019 10:44 AM EST ----- Unremarkable imaging results. Please call patient. Continue current plan. -VRP

## 2019-06-01 ENCOUNTER — Ambulatory Visit: Payer: Medicare PPO | Admitting: Speech Pathology

## 2019-06-03 ENCOUNTER — Ambulatory Visit: Payer: Medicare PPO

## 2019-06-06 ENCOUNTER — Ambulatory Visit: Payer: Medicare PPO

## 2019-06-06 DIAGNOSIS — R498 Other voice and resonance disorders: Secondary | ICD-10-CM | POA: Diagnosis not present

## 2019-06-06 NOTE — Therapy (Signed)
[facility] [address] [place], [place], [address] Phone: [phone]   Fax:  [phone]  Speech Language Pathology Treatment  Patient Details  Name: Jasmine Lopez MRN: [id number] Date of Birth: 11-Oct-1940 Referring Provider (SLP): Dr. Christinia Gully   Encounter Date: 06/06/2019  End of Session - 06/06/19 1644    Visit Number  21    Number of Visits  35    Date for SLP Re-Evaluation  06/20/19    SLP Start Time  1410   pt had difficulty logging on to [employer]   SLP Stop Time   1445    SLP Time Calculation (min)  35 min    Activity Tolerance  Patient tolerated treatment well       Past Medical History:  Diagnosis Date  . Diabetes mellitus without complication ([place])   . Hypertension   . Irregular heart beat     Past Surgical History:  Procedure Laterality Date  . BREAST EXCISIONAL BIOPSY Bilateral     There were no vitals filed for this visit.  Subjective Assessment - 06/06/19 1630    Subjective  Pt reported practicing flow speech with [name] (son) but not putting reminders up in all rooms of house for flow speech.    Currently in Pain?  No/denies            ADULT SLP TREATMENT - 06/06/19 1634      General Information   Behavior/Cognition  Alert;Cooperative;Pleasant mood      Treatment Provided   Treatment provided  Cognitive-Linquistic      Cognitive-Linquistic Treatment   Treatment focused on  Voice;Patient/family/caregiver education    Skilled Treatment  [name] reports she has been completing HEP for voice consistently and has practiced flow phonation with 2 family members. SLP suggested they cue her AS SOON AS THEY KNOW she is not using flow phonation, as pt stated they tell her at the end of the conversation she has "switched back in my talking."  [name] required occasional min cues to consistently use flow phonation in conversations today. She reports VCD sx yesterday and today with post  nasal drip - in session today pt cleared throat x2 and performed blowing compensation each time to assist in tension subsiding. SLP encouaged pt to swallow hard with post nasal drip.       Assessment / Recommendations / Plan   Plan  Continue with current plan of care      Progression Toward Goals   Progression toward goals  Progressing toward goals       SLP Education - 06/06/19 1643    Education Details  use a humidifier at night if throat is dry in AMs, use hard swallow with post nasal drip instead of throat clear    Person(s) Educated  Patient    Methods  Explanation    Comprehension  Verbalized understanding       SLP Short Term Goals - 05/30/19 1503      SLP SHORT TERM GOAL #1   Title  Pt will converse for 8 minutes using abdominal breathing with occasional min A over 2 sessions    Baseline  03/07/19; 03/14/19    Time  2    Period  Weeks    Status  Achieved      SLP SHORT TERM GOAL #2   Title  Pt will report successful achievement of relaxation techniques 2x outside of therpay sessions over 3 sessions    Baseline  03/07/19;  Time  2    Period  Weeks    Status  Achieved      SLP SHORT TERM GOAL #3   Title  Pt will report decreased frequency of VCD symptoms outside of therapy session by 25% subjectively    Status  Achieved       SLP Long Term Goals - 06/06/19 1646      SLP LONG TERM GOAL #1   Title  Pt will demonstrate or report use of abdominal breathing with sniff/blow to reduce VCD episode with occasional min A    Baseline  03/07/19; 03/14/19, 03-25-19, 04/04/19    Time  3    Period  Weeks    Status  Achieved      SLP LONG TERM GOAL #2   Title  Pt will converse for 12 minutes with abdominal breathing with rare min A    Time  5    Period  Weeks    Status  Partially Met      SLP LONG TERM GOAL #3   Title  Pt will report decreased frequency of VCD symptoms outside of therapy room by 75%    Status  Achieved   upgraded from 50% to 75%     SLP LONG TERM  GOAL #4   Title  Pt will complete HEP for MTD with occasional min A over 3 sessions    Baseline  05/16/19; 05/18/19; 05/23/19    Time  6    Period  Weeks    Status  Achieved      SLP LONG TERM GOAL #5   Title  Pt will uitlize flow phonation in sentence responses 18/20 with occasional min A    Time  6    Period  Weeks    Status  Achieved      SLP LONG TERM GOAL #6   Title  Pt will converse for 15 minutes in complex conversation with adequate breath support and WNL phonation with occasional min A    Time  3    Period  Weeks    Status  On-going       Plan - 06/06/19 1645    Clinical Impression Statement  Brandin has progressed to using flow phonation to achieve WNL voice at sentence and simple conversation level with ST with consistent verbal cues.  She has carried over use of flow phonation with 2 family members. She reports completion of HEP regularly.  She requires rare to occasional min A to maintain flow phonatoin. Recommend continue skilled ST to maximize carryover of compensations with family and friends to maintain normal phonation and maximize intelligilbity.    Speech Therapy Frequency  2x / week    Duration  --   16 weeks or 35 visits   Treatment/Interventions  SLP instruction and feedback;Compensatory strategies;Functional tasks;Compensatory techniques;Internal/external aids;Patient/family education;Other (comment)    Potential to Achieve Goals  Good       Patient will benefit from skilled therapeutic intervention in order to improve the following deficits and impairments:   Other voice and resonance disorders    Problem List Patient Active Problem List   Diagnosis Date Noted  . Memory loss 05/17/2019  . Weight gain 04/15/2019  . Upper airway cough syndrome 02/02/2019  . History of ductal carcinoma in situ (DCIS) of breast 12/28/2018  . Pain of left breast 12/28/2018  . Mixed hyperlipidemia 10/28/2018  . Trigger finger, right middle finger 10/14/2018  . Dry mouth,  unspecified 10/14/2018  . Allergic 07/15/2018  .  Hoarseness of voice 07/15/2018  . Umbilical hernia 22/30/0979  . Diastolic dysfunction 49/97/1820  . Depression 11/07/2017  . Heart murmur 11/07/2017  . Obesity (BMI 30-39.9) 08/24/2016  . Type 2 diabetes mellitus with hyperlipidemia ([place]) 05/01/2016  . Colon polyps 07/17/2015  . Vitamin D deficiency 06/12/2015  . Ductal carcinoma in situ (DCIS) of left breast 02/15/2015  . Papilloma of right breast 02/15/2015  . Idiopathic chronic gout 11/23/2014  . Non-insulin dependent type 2 diabetes mellitus ([place]) 10/13/2013  . Internal derangement of knee 12/29/2012  . Osteoarthrosis involving lower leg 12/29/2012  . Osteopenia 03/15/2012  . Essential hypertension 06/20/2011  . Cough 06/20/2011    Zariel Capano ,MS, CCC-SLP  06/06/2019, 4:46 PM  [facility] [address] [place], [place], [address] Phone: [phone]   Fax:  [phone]   Name: LIAT MAYOL MRN: [id number] Date of Birth: 08/06/1940

## 2019-06-08 ENCOUNTER — Ambulatory Visit: Payer: Medicare PPO

## 2019-06-09 ENCOUNTER — Ambulatory Visit: Payer: Medicare PPO

## 2019-06-13 ENCOUNTER — Ambulatory Visit: Payer: Medicare PPO | Attending: Nurse Practitioner

## 2019-06-13 ENCOUNTER — Other Ambulatory Visit: Payer: Self-pay

## 2019-06-13 DIAGNOSIS — R498 Other voice and resonance disorders: Secondary | ICD-10-CM | POA: Diagnosis not present

## 2019-06-13 NOTE — Therapy (Signed)
[facility] [address] [place], [place], [address] Phone: [phone]   Fax:  [phone]  Speech Language Pathology Treatment/Discharge summary  Patient Details  Name: Jasmine Lopez MRN: [id number] Date of Birth: May 15, 1940 Referring Provider (SLP): Dr. Christinia Gully   Encounter Date: 06/13/2019  End of Session - 06/13/19 1640    Visit Number  22    Number of Visits  35    Date for SLP Re-Evaluation  06/20/19    SLP Start Time  63    SLP Stop Time   1605    SLP Time Calculation (min)  31 min    Activity Tolerance  Patient tolerated treatment well       Past Medical History:  Diagnosis Date  . Diabetes mellitus without complication ([facility])   . Hypertension   . Irregular heart beat     Past Surgical History:  Procedure Laterality Date  . BREAST EXCISIONAL BIOPSY Bilateral     There were no vitals filed for this visit.  Subjective Assessment - 06/13/19 1601    Subjective  Pt has put up signs in her house to assist with "talking in flow".    Currently in Pain?  No/denies        Pt provided authorization for skilled ST session today, and for billing her insurance.  SLP ensured pt was at same telephone number as previous appointment should emergency arise. SLP informed pt I was authorized to provide ST services in the state of [place].    ADULT SLP TREATMENT - 06/13/19 1635      General Information   Behavior/Cognition  Alert;Cooperative;Pleasant mood      Treatment Provided   Treatment provided  Cognitive-Linquistic      Cognitive-Linquistic Treatment   Treatment focused on  Voice;Patient/family/caregiver education    Skilled Treatment  Jasmine Lopez reports she has completed HEP for voice consistently, has practiced flow phonation with 2 family members, and has placed signs up around her home to remind her to "talk in flow". Pt stated family cues her AS SOON AS THEY KNOW she is not using flow phonation.  Jasmine Lopez required rare min cues to consistently use flow phonation in 25 minutes of conversations today. She was able to self correct x2 during this conversation from non-flow, gravelly-tone to flow phonation, successfully. Pt without any throat clearing in today's session. SLp again encourged pt to use external cues for more habitual flow phonation during the day and pt agreed this would help.       Assessment / Recommendations / Plan   Plan  --   d/c - pt agrees     Progression Toward Goals   Progression toward goals  --   d/c - see summary        SLP Short Term Goals - 05/30/19 1503      SLP SHORT TERM GOAL #1   Title  Pt will converse for 8 minutes using abdominal breathing with occasional min A over 2 sessions    Baseline  03/07/19; 03/14/19    Time  2    Period  Weeks    Status  Achieved      SLP SHORT TERM GOAL #2   Title  Pt will report successful achievement of relaxation techniques 2x outside of therpay sessions over 3 sessions    Baseline  03/07/19;    Time  2    Period  Weeks    Status  Achieved      SLP  SHORT TERM GOAL #3   Title  Pt will report decreased frequency of VCD symptoms outside of therapy session by 25% subjectively    Status  Achieved       SLP Long Term Goals - 06/13/19 1601      SLP LONG TERM GOAL #1   Title  Pt will demonstrate or report use of abdominal breathing with sniff/blow to reduce VCD episode with occasional min A    Baseline  03/07/19; 03/14/19, 03-25-19, 04/04/19    Time  3    Period  Weeks    Status  Achieved      SLP LONG TERM GOAL #2   Title  Pt will converse for 12 minutes with abdominal breathing with rare min A    Time  5    Period  Weeks    Status  Partially Met      SLP LONG TERM GOAL #3   Title  Pt will report decreased frequency of VCD symptoms outside of therapy room by 75%    Status  Achieved   upgraded from 50% to 75%     SLP LONG TERM GOAL #4   Title  Pt will complete HEP for MTD with occasional min A over 3  sessions    Baseline  05/16/19; 05/18/19; 05/23/19    Time  6    Period  Weeks    Status  Achieved      SLP LONG TERM GOAL #5   Title  Pt will uitlize flow phonation in sentence responses 18/20 with occasional min A    Time  6    Period  Weeks    Status  Achieved      SLP LONG TERM GOAL #6   Title  Pt will converse for 15 minutes in complex conversation with adequate breath support and WNL phonation with occasional min A    Time  3    Period  Weeks    Status  Achieved       Plan - 06/13/19 [place] has progressed to using flow phonation to achieve WNL voice at conversation level with ST with less verbal cues than previous weeks.  She has carried over use of flow phonation with 2 family members. She reports completion of HEP regularly.  She reports better carryover in the past week than in previous weeks. She agrees with d/c today.    Treatment/Interventions  SLP instruction and feedback;Compensatory strategies;Functional tasks;Compensatory techniques;Internal/external aids;Patient/family education;Other (comment)    Potential to Achieve Goals  Good       Patient will benefit from skilled therapeutic intervention in order to improve the following deficits and impairments:   Other voice and resonance disorders   SPEECH THERAPY DISCHARGE SUMMARY  Visits from Start of Care: 22  Current functional level related to goals / functional outcomes: See goal summary above. Pt worked hard to [employer] VCD s/sx, and to maintain WNL vocal quality as a result of the VCD. Pt has minimized throat clearing.   Remaining deficits: Mild vocal quality changes when pt loses attention/focus on maintaining good vocal quality.   Education / Equipment: Compensations for VCD, flow phonation.   Plan: Patient agrees to discharge.  Patient goals were partially met. Patient is being discharged due to                                                     ?????  maxing her current  rehabilitative outcomes and being pleased with current functional level.       Problem List Patient Active Problem List   Diagnosis Date Noted  . Memory loss 05/17/2019  . Weight gain 04/15/2019  . Upper airway cough syndrome 02/02/2019  . History of ductal carcinoma in situ (DCIS) of breast 12/28/2018  . Pain of left breast 12/28/2018  . Mixed hyperlipidemia 10/28/2018  . Trigger finger, right middle finger 10/14/2018  . Dry mouth, unspecified 10/14/2018  . Allergic 07/15/2018  . Hoarseness of voice 07/15/2018  . Umbilical hernia 54/65/0354  . Diastolic dysfunction 65/68/1275  . Depression 11/07/2017  . Heart murmur 11/07/2017  . Obesity (BMI 30-39.9) 08/24/2016  . Type 2 diabetes mellitus with hyperlipidemia ([facility]) 05/01/2016  . Colon polyps 07/17/2015  . Vitamin D deficiency 06/12/2015  . Ductal carcinoma in situ (DCIS) of left breast 02/15/2015  . Papilloma of right breast 02/15/2015  . Idiopathic chronic gout 11/23/2014  . Non-insulin dependent type 2 diabetes mellitus ([place]) 10/13/2013  . Internal derangement of knee 12/29/2012  . Osteoarthrosis involving lower leg 12/29/2012  . Osteopenia 03/15/2012  . Essential hypertension 06/20/2011  . Cough 06/20/2011    , ,MS, CCC-SLP  06/13/2019, 4:42 PM  [facility] [address] [place], [place], [address] Phone: [phone]   Fax:  [phone]   Name: Jasmine Lopez MRN: [id number] Date of Birth: 19-Jun-1940

## 2019-06-23 ENCOUNTER — Ambulatory Visit (INDEPENDENT_AMBULATORY_CARE_PROVIDER_SITE_OTHER): Payer: Medicare PPO | Admitting: Pharmacist

## 2019-06-23 ENCOUNTER — Other Ambulatory Visit: Payer: Self-pay

## 2019-06-23 ENCOUNTER — Other Ambulatory Visit: Payer: Self-pay | Admitting: Cardiovascular Disease

## 2019-06-23 VITALS — BP 140/68 | HR 70 | Resp 14 | Ht 63.0 in | Wt 198.2 lb

## 2019-06-23 DIAGNOSIS — I10 Essential (primary) hypertension: Secondary | ICD-10-CM | POA: Diagnosis not present

## 2019-06-23 MED ORDER — VALSARTAN-HYDROCHLOROTHIAZIDE 160-25 MG PO TABS: 1.0000 | ORAL_TABLET | Freq: Every day | ORAL | 1 refills | Status: DC

## 2019-06-23 NOTE — Progress Notes (Signed)
Patient ID: Jasmine Lopez                 DOB: 06-May-1941                      MRN: [id number]     HPI: Jasmine Lopez is a 79 y.o. female referred by Dr. Oval Linsey to HTN clinic. PMH includes hypertension, breast cancer, murmur, diabetes, and depression.   Today she is here for follow up and reports feeling much better in the past couple of weeks.  She has managed to get her depression under some control and has been walking more recently.    Current HTN meds:  Amlodipine 38m daily (am) Carvedilol 6.213mtwice daily HCTZ 2537maily Losartan 100m1mily  BP goal: <130/80  Family History: patient's family history includes COPD in her mother; Cancer in her father; Heart attack in her maternal grandfather; Heart failure in her mother; Rectal cancer in her maternal grandmother.   Social History: patient  reports that she has never smoked. She has never used smokeless tobacco. She reports that she does not use drugs.   Has been drinking 3 mugs of coffee per day recently, but has cut back.  Also likes Coke/Pepsi, still 2-3 cans per week.  No alcohol  Diet: try to eat fresh, eating less fatty food and less salty foods. Has been eating out less and trying to monitor her diet more carefully.  Exercise: less exercise than usual and walking 15 minutes daily x 3-4 times per day  Home BP readings: no records available  Wt Readings from Last 3 Encounters:  06/23/19 198 lb 3.2 oz (89.9 kg)  05/19/19 198 lb 9.6 oz (90.1 kg)  05/17/19 201 lb 12.8 oz (91.5 kg)   BP Readings from Last 3 Encounters:  06/23/19 140/68  05/19/19 140/82  05/17/19 (!) 146/68   Pulse Readings from Last 3 Encounters:  06/23/19 70  05/19/19 63  05/17/19 65    Past Medical History:  Diagnosis Date  . Diabetes mellitus without complication ([facility])[place]. Hypertension   . Irregular heart beat     Current Outpatient Medications on File Prior to Visit  Medication Sig Dispense Refill  . acetaminophen (TYLENOL) 500 MG  tablet Take 500 mg by mouth every 6 (six) hours as needed.    . alMarland Kitchenopurinol (ZYLOPRIM) 100 MG tablet TAKE 1 TABLET BY MOUTH EVERY DAY 90 tablet 0  . amLODipine (NORVASC) 10 MG tablet Take 1 tablet (10 mg total) by mouth daily. 90 tablet 3  . amoxicillin (AMOXIL) 500 MG tablet Take 4 tablets by mouth 1 hour prior to dental procedure 4 tablet 0  . aspirin 325 MG EC tablet Take 325 mg by mouth every 4 (four) hours as needed for pain.    . blood glucose meter kit and supplies KIT Dispense based on patient and insurance preference. Use up to two times daily as directed. (FOR ICD-9 250.00, 250.01). 1 each 0  . carvedilol (COREG) 6.25 MG tablet TAKE 1 TABLET BY MOUTH TWICE A DAY 180 tablet 3  . Cholecalciferol (VITAMIN D3) 2000 units TABS Take by mouth.    . Garlic 10000947CAPS Take 1 capsule by mouth daily.    . glMarland Kitchencose blood (ONE TOUCH ULTRA TEST) test strip TEST FASTING BLOOD SUGAR AND AND 2 HOURS AFTER MEALS    . metFORMIN (GLUCOPHAGE) 500 MG tablet Take 500 mg by mouth daily.    . ONGlory RosebushICA LANCETS  33G MISC TEST GLUCOSE DAILY FASTING AND 2 HOURS AFTER MEALS    . rosuvastatin (CRESTOR) 20 MG tablet Take 1 tablet (20 mg total) by mouth daily. 30 tablet 5  . benzonatate (TESSALON) 200 MG capsule Take 1 capsule (200 mg total) by mouth 3 (three) times daily as needed for cough. (Patient not taking: Reported on 05/19/2019) 45 capsule 11  . Biotin 10000 MCG TBDP 1 qd    . famotidine (PEPCID) 20 MG tablet One after supper (Patient not taking: Reported on 06/23/2019) 60 tablet 11   No current facility-administered medications on file prior to visit.    Allergies  Allergen Reactions  . Ace Inhibitors Cough  . Atorvastatin Other (See Comments)    Leg pain   . Benadryl [Diphenhydramine] Itching    Benadryl cream    Blood pressure 140/68, pulse 70, resp. rate 14, height _0  (1.6 m), weight 198 lb 3.2 oz (89.9 kg), SpO2 99 %.  Essential hypertension Blood pressure remains above goal during OV.  Patient reports feeling well and working on positive lifestyle modification. Will discontinue losartan 100 and HCTZ 31m and start valsartan160/HCT25 daily. Plan to follow up in 4 week and increase valsartan to 3285mdaily if additional BP control is needed.   KrTommy MedalharmD CPP [facility] [id number] [id number]/18/2021 10:45 AM

## 2019-06-23 NOTE — Patient Instructions (Addendum)
Return for a follow up appointment in 4-6 weeks  Check your blood pressure at home daily (if able) and keep record of the readings.  Take your BP meds as follows: *STOP taking losartan* *STOP taking HCTZ* *START TAKING valsartan/HCTZ 160/25mg  daily*  *START hydrated - drink more water*   Bring all of your meds, your BP cuff and your record of home blood pressures to your next appointment.  Exercise as you're able, try to walk approximately 30 minutes per day.  Keep salt intake to a minimum, especially watch canned and prepared boxed foods.  Eat more fresh fruits and vegetables and fewer canned items.  Avoid eating in fast food restaurants.     HOW TO TAKE YOUR BLOOD PRESSURE: . Rest 5 minutes before taking your blood pressure. .  Don't smoke or drink caffeinated beverages for at least 30 minutes before. . Take your blood pressure before (not after) you eat. . Sit comfortably with your back supported and both feet on the floor (don't cross your legs). . Elevate your arm to heart level on a table or a desk. . Use the proper sized cuff. It should fit smoothly and snugly around your bare upper arm. There should be enough room to slip a fingertip under the cuff. The bottom edge of the cuff should be 1 inch above the crease of the elbow. . Ideally, take 3 measurements at one sitting and record the average.

## 2019-06-30 ENCOUNTER — Encounter: Payer: Self-pay | Admitting: Pharmacist

## 2019-06-30 NOTE — Assessment & Plan Note (Signed)
Blood pressure remains above goal during OV. Patient reports feeling well and working on positive lifestyle modification. Will discontinue losartan 100 and HCTZ 25mg  and start valsartan160/HCT25 daily. Plan to follow up in 4 week and increase valsartan to 320mg  daily if additional BP control is needed.

## 2019-07-12 DIAGNOSIS — M9901 Segmental and somatic dysfunction of cervical region: Secondary | ICD-10-CM | POA: Diagnosis not present

## 2019-07-12 DIAGNOSIS — M9905 Segmental and somatic dysfunction of pelvic region: Secondary | ICD-10-CM | POA: Diagnosis not present

## 2019-07-12 DIAGNOSIS — M9903 Segmental and somatic dysfunction of lumbar region: Secondary | ICD-10-CM | POA: Diagnosis not present

## 2019-07-12 DIAGNOSIS — M5032 Other cervical disc degeneration, mid-cervical region, unspecified level: Secondary | ICD-10-CM | POA: Diagnosis not present

## 2019-07-12 DIAGNOSIS — M5136 Other intervertebral disc degeneration, lumbar region: Secondary | ICD-10-CM | POA: Diagnosis not present

## 2019-07-12 DIAGNOSIS — M9902 Segmental and somatic dysfunction of thoracic region: Secondary | ICD-10-CM | POA: Diagnosis not present

## 2019-07-12 DIAGNOSIS — M47814 Spondylosis without myelopathy or radiculopathy, thoracic region: Secondary | ICD-10-CM | POA: Diagnosis not present

## 2019-07-12 DIAGNOSIS — S39012A Strain of muscle, fascia and tendon of lower back, initial encounter: Secondary | ICD-10-CM | POA: Diagnosis not present

## 2019-07-13 DIAGNOSIS — R490 Dysphonia: Secondary | ICD-10-CM | POA: Diagnosis not present

## 2019-07-13 DIAGNOSIS — R05 Cough: Secondary | ICD-10-CM | POA: Diagnosis not present

## 2019-07-24 ENCOUNTER — Other Ambulatory Visit: Payer: Self-pay | Admitting: Internal Medicine

## 2019-07-27 ENCOUNTER — Other Ambulatory Visit: Payer: Self-pay | Admitting: Cardiovascular Disease

## 2019-07-28 ENCOUNTER — Ambulatory Visit: Payer: Self-pay | Admitting: Internal Medicine

## 2019-08-01 ENCOUNTER — Ambulatory Visit: Payer: Medicare PPO | Admitting: Nurse Practitioner

## 2019-08-01 ENCOUNTER — Other Ambulatory Visit: Payer: Self-pay

## 2019-08-01 ENCOUNTER — Encounter: Payer: Self-pay | Admitting: Nurse Practitioner

## 2019-08-01 VITALS — BP 124/78 | HR 64 | Temp 97.5°F | Ht 63.0 in | Wt 196.4 lb

## 2019-08-01 DIAGNOSIS — R202 Paresthesia of skin: Secondary | ICD-10-CM

## 2019-08-01 DIAGNOSIS — E1169 Type 2 diabetes mellitus with other specified complication: Secondary | ICD-10-CM | POA: Diagnosis not present

## 2019-08-01 DIAGNOSIS — R4189 Other symptoms and signs involving cognitive functions and awareness: Secondary | ICD-10-CM

## 2019-08-01 DIAGNOSIS — F329 Major depressive disorder, single episode, unspecified: Secondary | ICD-10-CM

## 2019-08-01 DIAGNOSIS — E785 Hyperlipidemia, unspecified: Secondary | ICD-10-CM

## 2019-08-01 DIAGNOSIS — F32A Depression, unspecified: Secondary | ICD-10-CM

## 2019-08-01 DIAGNOSIS — R200 Anesthesia of skin: Secondary | ICD-10-CM

## 2019-08-01 MED ORDER — BUPROPION HCL ER (XL) 150 MG PO TB24: 150.0000 mg | ORAL_TABLET | ORAL | 2 refills | Status: DC

## 2019-08-01 NOTE — Progress Notes (Signed)
This visit occurred during the SARS-CoV-2 public health emergency.  Safety protocols were in place, including screening questions prior to the visit, additional usage of staff PPE, and extensive cleaning of exam room while observing appropriate contact time as indicated for disinfecting solutions.  Subjective:     Patient ID: Jasmine Lopez , female    DOB: Feb 03, 1941 , 79 y.o.   MRN: [id number]   Chief Complaint  Patient presents with  . Referral    would like to be started back on wellbutrion    HPI  She is here today due to having gradual issues.  She will have delays and changes in her sentence. She has been on wellbutrin in the past after losing her husband and her mother.   She has seen a psychologist in the past.  She does have a lot of things going on.  She has noticed she is not speaking to others.  (Dr. Junie Bame). She is finding herself to be very withdrawn. She is living in her mothers home and has a home in [place]. She is having to think more about her responses.   Within the last 1.5 years her cognitive function is not doing well. Concerned if stress, stroke?, she has seen a neurologist.    It has been 2 years since the lost of her husband and mother.  No family history of dementia or alzheimers in her family.  She will start and stop doing activities at home, will forget to do different activities.  She will have to stop and rethink everything.  She has called accidentally the sheriff.   Left toe stomped on corner of chair in January or February.  No longer swelling, son feels she is walking differently.  She feels like it is almost numb.  Right foot pain as well  She is here today with her son    Past Medical History:  Diagnosis Date  . Diabetes mellitus without complication ([place])   . Hypertension   . Irregular heart beat      Family History  Problem Relation Age of Onset  . Heart failure Mother   . COPD Mother   . Cancer Father   . Rectal cancer Maternal  Grandmother   . Heart attack Maternal Grandfather      Current Outpatient Medications:  .  acetaminophen (TYLENOL) 500 MG tablet, Take 500 mg by mouth every 6 (six) hours as needed., Disp: , Rfl:  .  allopurinol (ZYLOPRIM) 100 MG tablet, TAKE 1 TABLET BY MOUTH EVERY DAY, Disp: 90 tablet, Rfl: 0 .  amLODipine (NORVASC) 10 MG tablet, Take 1 tablet (10 mg total) by mouth daily., Disp: 90 tablet, Rfl: 3 .  amoxicillin (AMOXIL) 500 MG tablet, Take 4 tablets by mouth 1 hour prior to dental procedure, Disp: 4 tablet, Rfl: 0 .  aspirin 325 MG EC tablet, Take 325 mg by mouth every 4 (four) hours as needed for pain., Disp: , Rfl:  .  Biotin 10000 MCG TBDP, 1 qd, Disp: , Rfl:  .  blood glucose meter kit and supplies KIT, Dispense based on patient and insurance preference. Use up to two times daily as directed. (FOR ICD-9 250.00, 250.01)., Disp: 1 each, Rfl: 0 .  carvedilol (COREG) 6.25 MG tablet, TAKE 1 TABLET BY MOUTH TWICE A DAY, Disp: 180 tablet, Rfl: 3 .  Cholecalciferol (VITAMIN D3) 2000 units TABS, Take by mouth., Disp: , Rfl:  .  Garlic 7322 MG CAPS, Take 1 capsule by mouth daily., Disp: ,  Rfl:  .  glucose blood (ONE TOUCH ULTRA TEST) test strip, TEST FASTING BLOOD SUGAR AND AND 2 HOURS AFTER MEALS, Disp: , Rfl:  .  metFORMIN (GLUCOPHAGE) 500 MG tablet, Take 500 mg by mouth daily., Disp: , Rfl:  .  ONETOUCH DELICA LANCETS 36I MISC, TEST GLUCOSE DAILY FASTING AND 2 HOURS AFTER MEALS, Disp: , Rfl:  .  valsartan-hydrochlorothiazide (DIOVAN-HCT) 160-25 MG tablet, TAKE 1 TABLET BY MOUTH DAILY, Disp: 90 tablet, Rfl: 2 .  benzonatate (TESSALON) 200 MG capsule, Take 1 capsule (200 mg total) by mouth 3 (three) times daily as needed for cough. (Patient not taking: Reported on 05/19/2019), Disp: 45 capsule, Rfl: 11 .  famotidine (PEPCID) 20 MG tablet, One after supper (Patient not taking: Reported on 06/23/2019), Disp: 60 tablet, Rfl: 11 .  rosuvastatin (CRESTOR) 20 MG tablet, Take 1 tablet (20 mg total) by  mouth daily., Disp: 30 tablet, Rfl: 5   Allergies  Allergen Reactions  . Ace Inhibitors Cough  . Atorvastatin Other (See Comments)    Leg pain   . Benadryl [Diphenhydramine] Itching    Benadryl cream     Review of Systems  Constitutional: Negative.   Respiratory: Negative.  Negative for cough.   Cardiovascular: Negative.  Negative for chest pain, palpitations and leg swelling.  Neurological: Negative for dizziness and headaches.  Psychiatric/Behavioral: Positive for decreased concentration. Negative for agitation, behavioral problems and confusion. The patient is not nervous/anxious.      Today's Vitals   08/01/19 1412  BP: 124/78  Pulse: 64  Temp: (!) 97.5 F (36.4 C)  TempSrc: Oral  SpO2: 96%  Weight: 196 lb 6.4 oz (89.1 kg)  Height: '5\' 3"'  (1.6 m)   Body mass index is 34.79 kg/m.   Objective:  Physical Exam Cardiovascular:     Rate and Rhythm: Normal rate and regular rhythm.     Pulses: Normal pulses.     Heart sounds: Normal heart sounds. No murmur.  Pulmonary:     Effort: Pulmonary effort is normal. No respiratory distress.     Breath sounds: Normal breath sounds.  Skin:    Capillary Refill: Capillary refill takes less than 2 seconds.  Neurological:     General: No focal deficit present.     Mental Status: She is alert and oriented to person, place, and time.     Cranial Nerves: No cranial nerve deficit.  Psychiatric:        Mood and Affect: Mood normal.        Behavior: Behavior normal.        Thought Content: Thought content normal.        Judgment: Judgment normal.         Assessment And Plan:     1. Depression, unspecified depression type  She is having difficulty with her mood and having difficult remembering information  Her and her son feel is related to depression  I will refer for psychiatry for counseling and further evaluation  She is to start on wellbutrin as well she is aware can take up to 2 weeks to see any type of effect -  Ambulatory referral to Psychiatry - buPROPion (WELLBUTRIN XL) 150 MG 24 hr tablet; Take 1 tablet (150 mg total) by mouth every morning.  Dispense: 30 tablet; Refill: 2  2. Impaired cognition  Will check for metabolic causes - Ambulatory referral to Psychiatry - Vitamin D (25 hydroxy) - T pallidum Screening [place]  3. Type 2 diabetes mellitus with hyperlipidemia (HCC)  She would like a referral to a foot provider as she had one previously.     - Ambulatory referral to Podiatry  4. Numbness of toes  Right 5th metatarsal numbness both feet  Will refer to podiatrist  [name], FNP     THE PATIENT IS ENCOURAGED TO PRACTICE SOCIAL DISTANCING DUE TO THE COVID-19 PANDEMIC.

## 2019-08-02 DIAGNOSIS — M9903 Segmental and somatic dysfunction of lumbar region: Secondary | ICD-10-CM | POA: Diagnosis not present

## 2019-08-02 DIAGNOSIS — S39012A Strain of muscle, fascia and tendon of lower back, initial encounter: Secondary | ICD-10-CM | POA: Diagnosis not present

## 2019-08-02 DIAGNOSIS — M47814 Spondylosis without myelopathy or radiculopathy, thoracic region: Secondary | ICD-10-CM | POA: Diagnosis not present

## 2019-08-02 DIAGNOSIS — M9901 Segmental and somatic dysfunction of cervical region: Secondary | ICD-10-CM | POA: Diagnosis not present

## 2019-08-02 DIAGNOSIS — M9902 Segmental and somatic dysfunction of thoracic region: Secondary | ICD-10-CM | POA: Diagnosis not present

## 2019-08-02 DIAGNOSIS — M5032 Other cervical disc degeneration, mid-cervical region, unspecified level: Secondary | ICD-10-CM | POA: Diagnosis not present

## 2019-08-02 DIAGNOSIS — M9905 Segmental and somatic dysfunction of pelvic region: Secondary | ICD-10-CM | POA: Diagnosis not present

## 2019-08-02 DIAGNOSIS — M5136 Other intervertebral disc degeneration, lumbar region: Secondary | ICD-10-CM | POA: Diagnosis not present

## 2019-08-02 LAB — VITAMIN D 25 HYDROXY (VIT D DEFICIENCY, FRACTURES): Vit D, 25-Hydroxy: 54.9 ng/mL (ref 30.0–100.0)

## 2019-08-02 LAB — T PALLIDUM SCREENING CASCADE: T pallidum Antibodies (TP-PA): NONREACTIVE

## 2019-08-03 ENCOUNTER — Other Ambulatory Visit: Payer: Self-pay | Admitting: Cardiovascular Disease

## 2019-08-04 ENCOUNTER — Telehealth: Payer: Self-pay | Admitting: Licensed Clinical Social Worker

## 2019-08-04 ENCOUNTER — Ambulatory Visit: Payer: Medicare PPO | Admitting: Cardiovascular Disease

## 2019-08-04 ENCOUNTER — Other Ambulatory Visit: Payer: Self-pay

## 2019-08-04 ENCOUNTER — Encounter: Payer: Self-pay | Admitting: Cardiovascular Disease

## 2019-08-04 VITALS — BP 120/64 | HR 60 | Ht 63.5 in | Wt 196.0 lb

## 2019-08-04 DIAGNOSIS — R413 Other amnesia: Secondary | ICD-10-CM | POA: Diagnosis not present

## 2019-08-04 DIAGNOSIS — I10 Essential (primary) hypertension: Secondary | ICD-10-CM | POA: Diagnosis not present

## 2019-08-04 DIAGNOSIS — E782 Mixed hyperlipidemia: Secondary | ICD-10-CM

## 2019-08-04 NOTE — Telephone Encounter (Signed)
CSW referred to assist patient with transportation to the PREP program. CSW contacted patient to follow up on request and she reports that she does drive but [facility] she is fearful to drive to the [place] but would really like to take advantage of the PREP program. CSW assured patient that we could arrange transport for her once the dates and times are arranged. CSW will follow up with Winifred Olive and inform of conversation with patient and to proceed with schedule. CSW continues to be available as needed and will arrange transport. Raquel Sarna, [place], [place]

## 2019-08-04 NOTE — Progress Notes (Signed)
Cardiology Office Note   Date:  08/04/2019   ID:  Lisel, Siegrist 1940-05-25, MRN [id number]  PCP:  Shelby Mattocks, PA-C  Cardiologist:   Skeet Latch, MD   No chief complaint on file.   History of Present Illness: ALYCE INSCORE is a 79 y.o. female with hypertension, breast cancer, and diabetes here for follow up.  She was initially seen 10/2017 for heart murmur.  Ms. Turrubiates recently moved to [place] from [place].  Her husband passed 02/2017 then her mother passed 06/2017.  She moved to be closer to her sons.  She established care with her PCP and was noted to have a murmur.  Ms. Gritz was seeing a cardiologist in [place] due to poorly controlled hypertension.  She had a stress test around 2017 that was reportedly negative for ischemia.  She was started on metoprolol which helped her to feel less short of breath.  She was referred for an echocardiogram 11/17/2017 that revealed LVEF 65 to 70% and no LVH or intracavitary gradient.  She had no valvular heart disease. Her BP was above goal so amlodipine was added.  She followed up with Kerin Ransom on 06/2018 and her BP was still elevated.  She had a hard time cutting chlorthalidone in half so it was discontinued.  Metoprolol was switched to carvedilol due to bradycardia.  Her BP was a little elevated 02/2019, but this was 2/2 steroid use and she was only taking carvedilol once daily.  She struggled with cough and has been diagnosed with vocal cord dysfunction.  She gets rehab and has GERD that is being treated.  This has helped her cough.  She has noted significant improvement in the symptoms.  She occasionally has heartburn symptoms that occur when she eats a heavy meal and lays down.  Otherwise it is well-controlled.  She has no heartburn with exertion.  At her last appointment Ms. Havlicek's blood pressure was poorly controlled.  Amlodipine was increased to 10 mg.  Blood pressure was 140/68 when she followed up with our  pharmacist.  Losartan was switched to valsartan.  Since then her blood pressure has been well controlled.  She was also referred to the PREP program at the [facility].  She wants to participate but feels uncomfortable driving around town.  Overall she has been feeling well.  She has no exertional chest pain.  She does get short of breath with exertion but attributes this to gaining weight.  She is hopeful that she will be able to start losing weight soon.  She wants to become more active.  She does complain of varicose veins.  She wears compression stockings but continues to have some discomfort at times.  At her last appointment she had some word finding difficulty and was concerned about Alzheimer's.  She was referred to neurology and had an MRI of the brain that was unremarkable, showing only mild age-appropriate changes of chronic small vessel disease and supratentorial cortical atrophy.  She was started back on Wellbutrin and feels that her symptoms have improved significantly.  She is able to think her thoughts through to completion and her mood has been much better.   Past Medical History:  Diagnosis Date  . Diabetes mellitus without complication ([place])   . Hypertension   . Irregular heart beat     Past Surgical History:  Procedure Laterality Date  . BREAST EXCISIONAL BIOPSY Bilateral      Current Outpatient Medications  Medication Sig Dispense Refill  .  acetaminophen (TYLENOL) 500 MG tablet Take 500 mg by mouth every 6 (six) hours as needed.    Marland Kitchen allopurinol (ZYLOPRIM) 100 MG tablet TAKE 1 TABLET BY MOUTH EVERY DAY 90 tablet 0  . amLODipine (NORVASC) 10 MG tablet Take 1 tablet (10 mg total) by mouth daily. 90 tablet 3  . amoxicillin (AMOXIL) 500 MG tablet Take 4 tablets by mouth 1 hour prior to dental procedure 4 tablet 0  . aspirin 325 MG EC tablet Take 325 mg by mouth every 4 (four) hours as needed for pain.    . benzonatate (TESSALON) 200 MG capsule Take 1 capsule (200 mg total) by mouth 3  (three) times daily as needed for cough. 45 capsule 11  . Biotin 10000 MCG TBDP 1 qd    . blood glucose meter kit and supplies KIT Dispense based on patient and insurance preference. Use up to two times daily as directed. (FOR ICD-9 250.00, 250.01). 1 each 0  . buPROPion (WELLBUTRIN XL) 150 MG 24 hr tablet Take 1 tablet (150 mg total) by mouth every morning. 30 tablet 2  . carvedilol (COREG) 6.25 MG tablet TAKE 1 TABLET BY MOUTH TWICE A DAY 180 tablet 3  . Cholecalciferol (VITAMIN D3) 2000 units TABS Take by mouth.    . famotidine (PEPCID) 20 MG tablet One after supper 60 tablet 11  . Garlic 3664 MG CAPS Take 1 capsule by mouth daily.    Marland Kitchen glucose blood (ONE TOUCH ULTRA TEST) test strip TEST FASTING BLOOD SUGAR AND AND 2 HOURS AFTER MEALS    . metFORMIN (GLUCOPHAGE) 500 MG tablet Take 500 mg by mouth daily.    Glory Rosebush DELICA LANCETS 40H MISC TEST GLUCOSE DAILY FASTING AND 2 HOURS AFTER MEALS    . valsartan-hydrochlorothiazide (DIOVAN-HCT) 160-25 MG tablet TAKE 1 TABLET BY MOUTH DAILY 90 tablet 2  . rosuvastatin (CRESTOR) 20 MG tablet Take 1 tablet (20 mg total) by mouth daily. 30 tablet 5   No current facility-administered medications for this visit.    Allergies:   Ace inhibitors, Atorvastatin, and Benadryl [diphenhydramine]    Social History:  The patient  reports that she has never smoked. She has never used smokeless tobacco. She reports that she does not drink alcohol or use drugs.   Family History:  The patient's family history includes COPD in her mother; Cancer in her father; Heart attack in her maternal grandfather; Heart failure in her mother; Rectal cancer in her maternal grandmother.    ROS:  Please see the history of present illness.   Otherwise, review of systems are positive for hair thinning.   All other systems are reviewed and negative.    PHYSICAL EXAM: VS:  BP 120/64   Pulse 60   Ht 5' 3.5" (1.613 m)   Wt 196 lb (88.9 kg)   SpO2 99%   BMI 34.18 kg/m  , BMI  Body mass index is 34.18 kg/m. GENERAL:  Well appearing HEENT: Pupils equal round and reactive, fundi not visualized, oral mucosa unremarkable NECK:  No jugular venous distention, waveform within normal limits, carotid upstroke brisk and symmetric, no bruits LUNGS:  Clear to auscultation bilaterally HEART:  RRR.  PMI not displaced or sustained,S1 and S2 within normal limits, no S3, no S4, no clicks, no rubs, II/VI systolic murmur at the LUSB ABD:  Flat, positive bowel sounds normal in frequency in pitch, no bruits, no rebound, no guarding, no midline pulsatile mass, no hepatomegaly, no splenomegaly EXT:  2 plus pulses throughout,  no edema, no cyanosis no clubbing SKIN:  No rashes no nodules NEURO:  Cranial nerves II through XII grossly intact, motor grossly intact throughout PSYCH:  Cognitively intact, oriented to person place and time   EKG:  EKG is not ordered today. 02/09/18: Sinus bradycardia.  Rate 54 bpm.  Nonspecific T wave abnormalities. 04/05/19: Sinus bradycardia.  Rate 58 bpm.  Nonspecific T wave abnormalities  Echo 11/17/17: Study Conclusions  - Left ventricle: The cavity size was normal. Systolic function was   hyperdynamic. The estimated ejection fraction was in the range of   70% to 75%. Wall motion was normal; there were no regional wall   motion abnormalities. Features are consistent with a pseudonormal   left ventricular filling pattern, with concomitant abnormal   relaxation and increased filling pressure (grade 2 diastolic   dysfunction). - Mitral valve: Valve area by pressure half-time: 2.22 cm^2. - Left atrium: The atrium was moderately dilated. - Pulmonary arteries: PA peak pressure: 31 mm Hg (S).  Recent Labs: 01/20/2019: ALT 14; BUN 16; Creatinine, Ser 0.98; Hemoglobin 13.4; Platelets 238; Potassium 4.4; Sodium 141; TSH 1.490   10/15/2017: TSH 2.06, free T4 1.09 Hemoglobin A1c 6.6% Sodium 141, potassium 3.9, BUN 11, creatinine 0.83  01/21/2018: Total  cholesterol 198, triglycerides 101, HDL 55, LDL 123  Lipid Panel    Component Value Date/Time   CHOL 194 01/20/2019 1255   TRIG 84 01/20/2019 1255   HDL 47 01/20/2019 1255   CHOLHDL 4.1 01/20/2019 1255   LDLCALC 132 (H) 01/20/2019 1255      Wt Readings from Last 3 Encounters:  08/04/19 196 lb (88.9 kg)  08/01/19 196 lb 6.4 oz (89.1 kg)  06/23/19 198 lb 3.2 oz (89.9 kg)      ASSESSMENT AND PLAN:  # Murmur:  Systolic murmur likely 2/2 hyperdynamic LV.  No evidence of LVOT gradient or valvular disease.    # Hypertension: BP is much better since switching losartan back to losartan.  Continue amlodipine, carvedilol, valsartan, and HCTZ.  # Hyperlipidemia: Continue rosuvastatin.  She is due to check fasting lipids/CMP.  # Varicose Veins: Recommend that she continue compression stockings and follow-up with [facility].  Current medicines are reviewed at length with the patient today.  The patient does not have concerns regarding medicines.  The following changes have been made: Increase amlodipine and resume atorvastatin  Labs/ tests ordered today include:   No orders of the defined types were placed in this encounter.    Disposition:   FU with Deveron Shamoon C. Oval Linsey, MD, [facility] in 1 year.    Signed, Khyla Mccumbers C. Oval Linsey, MD, [facility]  08/04/2019 10:33 AM    [place]

## 2019-08-04 NOTE — Patient Instructions (Addendum)
Medication Instructions:  Your physician recommends that you continue on your current medications as directed. Please refer to the Current Medication list given to you today.  *If you need a refill on your cardiac medications before your next appointment, please call your pharmacy*  Lab Work: NONE   Testing/Procedures: NONE   Follow-Up: At [facility], you and your health needs are our priority.  As part of our continuing mission to provide you with exceptional heart care, we have created designated Provider Care Teams.  These Care Teams include your primary Cardiologist (physician) and Advanced Practice Providers (APPs -  Physician Assistants and Nurse Practitioners) who all work together to provide you with the care you need, when you need it.  We recommend signing up for the patient portal called "MyChart".  Sign up information is provided on this After Visit Summary.  MyChart is used to connect with patients for Virtual Visits (Telemedicine).  Patients are able to view lab/test results, encounter notes, upcoming appointments, etc.  Non-urgent messages can be sent to your provider as well.   To learn more about what you can do with MyChart, go to [email or web address].    Your next appointment:   12 month(s)  You will receive a reminder letter in the mail two months in advance. If you don't receive a letter, please call our office to schedule the follow-up appointment.  The format for your next appointment:   In Person  Provider:   You may see Skeet Latch, MD or one of the following Advanced Practice Providers on your designated Care Team:    Kerin Ransom, PA-C  [place], [place]  Coletta Memos, [place]    Other Instructions:   PAM WILL BE IN [facility] REGARDING PREP AND JACKIE WILL REACH OUT TO YOU ABOUT [place]  Address: [address], [place], [place] [address] Hours:  Open ? Closes 5PM Phone: [phone]

## 2019-08-08 ENCOUNTER — Encounter: Payer: Self-pay | Admitting: Internal Medicine

## 2019-08-08 DIAGNOSIS — Z01419 Encounter for gynecological examination (general) (routine) without abnormal findings: Secondary | ICD-10-CM | POA: Diagnosis not present

## 2019-08-09 ENCOUNTER — Telehealth: Payer: Self-pay

## 2019-08-09 NOTE — Telephone Encounter (Signed)
Left message requesting call back to discuss referral to PREP from [facility]. Will await call back.

## 2019-08-16 DIAGNOSIS — M9903 Segmental and somatic dysfunction of lumbar region: Secondary | ICD-10-CM | POA: Diagnosis not present

## 2019-08-16 DIAGNOSIS — M9902 Segmental and somatic dysfunction of thoracic region: Secondary | ICD-10-CM | POA: Diagnosis not present

## 2019-08-16 DIAGNOSIS — M47814 Spondylosis without myelopathy or radiculopathy, thoracic region: Secondary | ICD-10-CM | POA: Diagnosis not present

## 2019-08-16 DIAGNOSIS — M9905 Segmental and somatic dysfunction of pelvic region: Secondary | ICD-10-CM | POA: Diagnosis not present

## 2019-08-16 DIAGNOSIS — M5136 Other intervertebral disc degeneration, lumbar region: Secondary | ICD-10-CM | POA: Diagnosis not present

## 2019-08-16 DIAGNOSIS — M9901 Segmental and somatic dysfunction of cervical region: Secondary | ICD-10-CM | POA: Diagnosis not present

## 2019-08-16 DIAGNOSIS — S39012A Strain of muscle, fascia and tendon of lower back, initial encounter: Secondary | ICD-10-CM | POA: Diagnosis not present

## 2019-08-16 DIAGNOSIS — M5032 Other cervical disc degeneration, mid-cervical region, unspecified level: Secondary | ICD-10-CM | POA: Diagnosis not present

## 2019-08-18 ENCOUNTER — Ambulatory Visit: Payer: Medicare PPO | Admitting: Internal Medicine

## 2019-08-22 ENCOUNTER — Telehealth: Payer: Self-pay | Admitting: Internal Medicine

## 2019-08-22 NOTE — Telephone Encounter (Signed)
Patient called states she received a bill for her breast imaging I advised the patient to call the breast center because we don't do the billing for them and if there is anything they need from [place] they will fax it over

## 2019-08-23 DIAGNOSIS — M9905 Segmental and somatic dysfunction of pelvic region: Secondary | ICD-10-CM | POA: Diagnosis not present

## 2019-08-23 DIAGNOSIS — M9901 Segmental and somatic dysfunction of cervical region: Secondary | ICD-10-CM | POA: Diagnosis not present

## 2019-08-23 DIAGNOSIS — M9902 Segmental and somatic dysfunction of thoracic region: Secondary | ICD-10-CM | POA: Diagnosis not present

## 2019-08-23 DIAGNOSIS — M5032 Other cervical disc degeneration, mid-cervical region, unspecified level: Secondary | ICD-10-CM | POA: Diagnosis not present

## 2019-08-23 DIAGNOSIS — S39012A Strain of muscle, fascia and tendon of lower back, initial encounter: Secondary | ICD-10-CM | POA: Diagnosis not present

## 2019-08-23 DIAGNOSIS — M5136 Other intervertebral disc degeneration, lumbar region: Secondary | ICD-10-CM | POA: Diagnosis not present

## 2019-08-23 DIAGNOSIS — M47814 Spondylosis without myelopathy or radiculopathy, thoracic region: Secondary | ICD-10-CM | POA: Diagnosis not present

## 2019-08-23 DIAGNOSIS — M9903 Segmental and somatic dysfunction of lumbar region: Secondary | ICD-10-CM | POA: Diagnosis not present

## 2019-08-25 ENCOUNTER — Telehealth: Payer: Self-pay

## 2019-08-25 ENCOUNTER — Ambulatory Visit: Payer: Medicare PPO | Admitting: Internal Medicine

## 2019-08-25 NOTE — Telephone Encounter (Signed)
Pt came in 12/2018 for breast pain, she called about being billed from the breast center 12/2018 she stated her insurance told her she needed to call her provider office. Because she is being billed for having a mammogram to early. Directed pt to call the Breast Imaging maybe they can explain to her how they coded the scan.

## 2019-08-26 ENCOUNTER — Telehealth: Payer: Self-pay

## 2019-08-26 NOTE — Telephone Encounter (Signed)
Call placed to patient reference PREP class starting 5/3 MW 230p-345p she is agreeable. [place] [facility].  Will arrange for her.  Will call her next week for intake appt.

## 2019-09-01 DIAGNOSIS — M47814 Spondylosis without myelopathy or radiculopathy, thoracic region: Secondary | ICD-10-CM | POA: Diagnosis not present

## 2019-09-01 DIAGNOSIS — M9905 Segmental and somatic dysfunction of pelvic region: Secondary | ICD-10-CM | POA: Diagnosis not present

## 2019-09-01 DIAGNOSIS — M9901 Segmental and somatic dysfunction of cervical region: Secondary | ICD-10-CM | POA: Diagnosis not present

## 2019-09-01 DIAGNOSIS — M5032 Other cervical disc degeneration, mid-cervical region, unspecified level: Secondary | ICD-10-CM | POA: Diagnosis not present

## 2019-09-01 DIAGNOSIS — M9903 Segmental and somatic dysfunction of lumbar region: Secondary | ICD-10-CM | POA: Diagnosis not present

## 2019-09-01 DIAGNOSIS — M5136 Other intervertebral disc degeneration, lumbar region: Secondary | ICD-10-CM | POA: Diagnosis not present

## 2019-09-01 DIAGNOSIS — M9902 Segmental and somatic dysfunction of thoracic region: Secondary | ICD-10-CM | POA: Diagnosis not present

## 2019-09-01 DIAGNOSIS — S39012A Strain of muscle, fascia and tendon of lower back, initial encounter: Secondary | ICD-10-CM | POA: Diagnosis not present

## 2019-09-02 ENCOUNTER — Telehealth: Payer: Self-pay

## 2019-09-02 NOTE — Telephone Encounter (Signed)
Call placed to patient to sched intake appt for PREP classes.  Scheduled for 4/26 at 11am at [facility] arranged via [employer].

## 2019-09-05 NOTE — Progress Notes (Signed)
[facility] Report   Patient Details  Name: Jasmine Lopez MRN: [id number] Date of Birth: 03-22-1941 Age: 79 y.o. PCP: Shelby Mattocks, PA-C  Vitals:   09/05/19 1726  BP: (!) 150/80  Pulse: 70  SpO2: 97%  Weight: 198 lb 6.4 oz (90 kg)  Height: 5\' 3"  (1.6 m)     [employer] Eval - 09/05/19 1700      Referral    Referring Provider  [facility]    Reason for referral  Hypertension;Obesitity/Overweight      Measurement   Waist Circumference  44 inches    Hip Circumference  47 inches    Body fat  47.2 percent      Information for Trainer   Goals  dev exercise regimen 3xwk    Current Exercise  walk in house/yard    Orthopedic Concerns  bursitis in knees Bil    Pertinent Medical History  breast cancer, borderline DM, htn, murmur    Current Barriers  transport   will use [facility]   Restrictions/Precautions  Fall risk    Medications that affect exercise  Medication causing dizziness/drowsiness      Timed Up and Go (TUGS)   Timed Up and Go  Moderate risk 10-12 seconds      Mobility and Daily Activities   I find it easy to walk up or down two or more flights of stairs.  1    I have no trouble taking out the trash.  4    I do housework such as vacuuming and dusting on my own without difficulty.  4    I can easily lift a gallon of milk (8lbs).  4    I can easily walk a mile.  1    I have no trouble reaching into high cupboards or reaching down to pick up something from the floor.  2    I do not have trouble doing out-door work such as Armed forces logistics/support/administrative officer, raking leaves, or gardening.  1      Mobility and Daily Activities   I feel younger than my age.  2    I feel independent.  3    I feel energetic.  3    I live an active life.   2    I feel strong.  4    I feel healthy.  4    I feel active as other people my age.  2      How fit and strong are you.   Fit and Strong Total Score  37      Past Medical History:  Diagnosis Date  .  Diabetes mellitus without complication ([place])   . Hypertension   . Irregular heart beat    Past Surgical History:  Procedure Laterality Date  . BREAST EXCISIONAL BIOPSY Bilateral    Social History   Tobacco Use  Smoking Status Never Smoker  Smokeless Tobacco Never Used    PREP will start 09/12/2019 M/W 230p-345p x 12 wks    Pam Tally Joe 09/05/2019, 5:31 PM

## 2019-09-06 DIAGNOSIS — M47814 Spondylosis without myelopathy or radiculopathy, thoracic region: Secondary | ICD-10-CM | POA: Diagnosis not present

## 2019-09-06 DIAGNOSIS — M5032 Other cervical disc degeneration, mid-cervical region, unspecified level: Secondary | ICD-10-CM | POA: Diagnosis not present

## 2019-09-06 DIAGNOSIS — M5136 Other intervertebral disc degeneration, lumbar region: Secondary | ICD-10-CM | POA: Diagnosis not present

## 2019-09-06 DIAGNOSIS — M9901 Segmental and somatic dysfunction of cervical region: Secondary | ICD-10-CM | POA: Diagnosis not present

## 2019-09-06 DIAGNOSIS — M9902 Segmental and somatic dysfunction of thoracic region: Secondary | ICD-10-CM | POA: Diagnosis not present

## 2019-09-06 DIAGNOSIS — M9903 Segmental and somatic dysfunction of lumbar region: Secondary | ICD-10-CM | POA: Diagnosis not present

## 2019-09-07 DIAGNOSIS — M792 Neuralgia and neuritis, unspecified: Secondary | ICD-10-CM | POA: Diagnosis not present

## 2019-09-07 DIAGNOSIS — E1351 Other specified diabetes mellitus with diabetic peripheral angiopathy without gangrene: Secondary | ICD-10-CM | POA: Diagnosis not present

## 2019-09-07 DIAGNOSIS — I872 Venous insufficiency (chronic) (peripheral): Secondary | ICD-10-CM | POA: Diagnosis not present

## 2019-09-07 DIAGNOSIS — S92502A Displaced unspecified fracture of left lesser toe(s), initial encounter for closed fracture: Secondary | ICD-10-CM | POA: Diagnosis not present

## 2019-09-07 DIAGNOSIS — M205X1 Other deformities of toe(s) (acquired), right foot: Secondary | ICD-10-CM | POA: Diagnosis not present

## 2019-09-08 ENCOUNTER — Other Ambulatory Visit: Payer: Self-pay | Admitting: Internal Medicine

## 2019-09-12 NOTE — Progress Notes (Signed)
[facility] PREP Weekly Session   Patient Details  Name: Jasmine Lopez MRN: [id number] Date of Birth: [birth year] Age: 79 y.o. PCP: Shelby Mattocks, PA-C  There were no vitals filed for this visit.  [facility] Weekly seesion - 09/12/19 1600      Weekly Session   Topic Discussed  --   Orientation, info on perceived exertion given   Classes attended to date  [place] 09/12/2019, 4:21 PM

## 2019-09-19 NOTE — Progress Notes (Signed)
[facility] PREP Weekly Session   Patient Details  Name: Jasmine Lopez MRN: [id number] Date of Birth: [birth year] Age: 79 y.o. PCP: Shelby Mattocks, PA-C  Vitals:   09/19/19 1637  BP: 140/76  Pulse: 70  Weight: 197 lb 3.2 oz (89.4 kg)    [facility] Weekly seesion - 09/19/19 1600      Weekly Session   Topic Discussed  Importance of resistance training;Other ways to be active   initial talk about sodium   Minutes exercised this week  20 minutes    Classes attended to date  3     Fun things since last meeting: a trip to [place] for: my ability to move, steps, walk, to travel out of state Nutrition celebration: less bread eaten Barriers/struggles: I need to drink more water daily    [name] 09/19/2019, 4:39 PM

## 2019-09-20 DIAGNOSIS — M5032 Other cervical disc degeneration, mid-cervical region, unspecified level: Secondary | ICD-10-CM | POA: Diagnosis not present

## 2019-09-20 DIAGNOSIS — M9903 Segmental and somatic dysfunction of lumbar region: Secondary | ICD-10-CM | POA: Diagnosis not present

## 2019-09-20 DIAGNOSIS — M9901 Segmental and somatic dysfunction of cervical region: Secondary | ICD-10-CM | POA: Diagnosis not present

## 2019-09-20 DIAGNOSIS — M9902 Segmental and somatic dysfunction of thoracic region: Secondary | ICD-10-CM | POA: Diagnosis not present

## 2019-09-20 DIAGNOSIS — M5136 Other intervertebral disc degeneration, lumbar region: Secondary | ICD-10-CM | POA: Diagnosis not present

## 2019-09-20 DIAGNOSIS — M47814 Spondylosis without myelopathy or radiculopathy, thoracic region: Secondary | ICD-10-CM | POA: Diagnosis not present

## 2019-09-22 ENCOUNTER — Other Ambulatory Visit: Payer: Self-pay | Admitting: Internal Medicine

## 2019-09-23 ENCOUNTER — Other Ambulatory Visit: Payer: Self-pay | Admitting: Cardiovascular Disease

## 2019-09-26 NOTE — Progress Notes (Signed)
[facility] PREP Weekly Session   Patient Details  Name: Jasmine Lopez MRN: [id number] Date of Birth: [birth year] Age: 79 y.o. PCP: Shelby Mattocks, PA-C  Vitals:   09/26/19 1632  BP: (!) 150/70  Weight: 195 lb 3.2 oz (88.5 kg)    [facility] Weekly seesion - 09/26/19 1600      Weekly Session   Topic Discussed  Health habits    Minutes exercised this week  90 minutes    Classes attended to date  5      Fun things since last meeting: exercised during TV commercials Grateful for: my life, health to continue Nutrition celebration: I only ate slice cake and bread at 1 meal (niece's birthday party) Barriers/struggles: being constant to walk daily  [name] 09/26/2019, 4:33 PM

## 2019-10-06 DIAGNOSIS — M792 Neuralgia and neuritis, unspecified: Secondary | ICD-10-CM | POA: Diagnosis not present

## 2019-10-06 DIAGNOSIS — E1351 Other specified diabetes mellitus with diabetic peripheral angiopathy without gangrene: Secondary | ICD-10-CM | POA: Diagnosis not present

## 2019-10-06 DIAGNOSIS — B351 Tinea unguium: Secondary | ICD-10-CM | POA: Diagnosis not present

## 2019-10-06 DIAGNOSIS — I872 Venous insufficiency (chronic) (peripheral): Secondary | ICD-10-CM | POA: Diagnosis not present

## 2019-10-13 ENCOUNTER — Ambulatory Visit: Payer: Medicare PPO | Admitting: Internal Medicine

## 2019-10-13 ENCOUNTER — Ambulatory Visit (INDEPENDENT_AMBULATORY_CARE_PROVIDER_SITE_OTHER): Payer: Medicare PPO

## 2019-10-13 ENCOUNTER — Other Ambulatory Visit: Payer: Self-pay

## 2019-10-13 VITALS — BP 144/80 | Temp 98.7°F | Ht 62.8 in | Wt 194.0 lb

## 2019-10-13 VITALS — BP 144/80 | HR 71 | Temp 98.7°F | Ht 62.8 in | Wt 194.0 lb

## 2019-10-13 DIAGNOSIS — E2839 Other primary ovarian failure: Secondary | ICD-10-CM

## 2019-10-13 DIAGNOSIS — G8929 Other chronic pain: Secondary | ICD-10-CM

## 2019-10-13 DIAGNOSIS — R011 Cardiac murmur, unspecified: Secondary | ICD-10-CM | POA: Diagnosis not present

## 2019-10-13 DIAGNOSIS — E782 Mixed hyperlipidemia: Secondary | ICD-10-CM | POA: Diagnosis not present

## 2019-10-13 DIAGNOSIS — M542 Cervicalgia: Secondary | ICD-10-CM

## 2019-10-13 DIAGNOSIS — M546 Pain in thoracic spine: Secondary | ICD-10-CM | POA: Diagnosis not present

## 2019-10-13 DIAGNOSIS — M5032 Other cervical disc degeneration, mid-cervical region, unspecified level: Secondary | ICD-10-CM | POA: Diagnosis not present

## 2019-10-13 DIAGNOSIS — M47814 Spondylosis without myelopathy or radiculopathy, thoracic region: Secondary | ICD-10-CM | POA: Diagnosis not present

## 2019-10-13 DIAGNOSIS — Z0000 Encounter for general adult medical examination without abnormal findings: Secondary | ICD-10-CM | POA: Diagnosis not present

## 2019-10-13 DIAGNOSIS — M5136 Other intervertebral disc degeneration, lumbar region: Secondary | ICD-10-CM | POA: Diagnosis not present

## 2019-10-13 DIAGNOSIS — Z23 Encounter for immunization: Secondary | ICD-10-CM | POA: Diagnosis not present

## 2019-10-13 DIAGNOSIS — E785 Hyperlipidemia, unspecified: Secondary | ICD-10-CM | POA: Diagnosis not present

## 2019-10-13 DIAGNOSIS — M9903 Segmental and somatic dysfunction of lumbar region: Secondary | ICD-10-CM | POA: Diagnosis not present

## 2019-10-13 DIAGNOSIS — Z1211 Encounter for screening for malignant neoplasm of colon: Secondary | ICD-10-CM | POA: Diagnosis not present

## 2019-10-13 DIAGNOSIS — M9901 Segmental and somatic dysfunction of cervical region: Secondary | ICD-10-CM | POA: Diagnosis not present

## 2019-10-13 DIAGNOSIS — E1169 Type 2 diabetes mellitus with other specified complication: Secondary | ICD-10-CM | POA: Diagnosis not present

## 2019-10-13 DIAGNOSIS — M9902 Segmental and somatic dysfunction of thoracic region: Secondary | ICD-10-CM | POA: Diagnosis not present

## 2019-10-13 LAB — POCT URINALYSIS DIPSTICK
Bilirubin, UA: NEGATIVE
Blood, UA: NEGATIVE
Glucose, UA: NEGATIVE
Ketones, UA: NEGATIVE
Nitrite, UA: NEGATIVE
Protein, UA: NEGATIVE
Spec Grav, UA: 1.015 (ref 1.010–1.025)
Urobilinogen, UA: 0.2 E.U./dL
pH, UA: 6 (ref 5.0–8.0)

## 2019-10-13 LAB — POCT UA - MICROALBUMIN
Albumin/Creatinine Ratio, Urine, POC: <30
Creatinine, POC: 100 mg/dL
Microalbumin Ur, POC: 10 mg/L

## 2019-10-13 MED ORDER — BOOSTRIX 5-2.5-18.5 LF-MCG/0.5 IM SUSP: 0.5000 mL | Freq: Once | INTRAMUSCULAR | 0 refills | Status: AC

## 2019-10-13 NOTE — Patient Instructions (Signed)
Preventive Care 77 Years and Older, Female Preventive care refers to lifestyle choices and visits with your health care provider that can promote health and wellness. This includes:  A yearly physical exam. This is also called an annual well check.  Regular dental and eye exams.  Immunizations.  Screening for certain conditions.  Healthy lifestyle choices, such as diet and exercise. What can I expect for my preventive care visit? Physical exam Your health care provider will check:  Height and weight. These may be used to calculate body mass index (BMI), which is a measurement that tells if you are at a healthy weight.  Heart rate and blood pressure.  Your skin for abnormal spots. Counseling Your health care provider may ask you questions about:  Alcohol, tobacco, and drug use.  Emotional well-being.  Home and relationship well-being.  Sexual activity.  Eating habits.  History of falls.  Memory and ability to understand (cognition).  Work and work Statistician.  Pregnancy and menstrual history. What immunizations do I need?  Influenza (flu) vaccine  This is recommended every year. Tetanus, diphtheria, and pertussis (Tdap) vaccine  You may need a Td booster every 10 years. Varicella (chickenpox) vaccine  You may need this vaccine if you have not already been vaccinated. Zoster (shingles) vaccine  You may need this after age 77. Pneumococcal conjugate (PCV13) vaccine  One dose is recommended after age 77. Pneumococcal polysaccharide (PPSV23) vaccine  One dose is recommended after age 77. Measles, mumps, and rubella (MMR) vaccine  You may need at least one dose of MMR if you were born in 1957 or later. You may also need a second dose. Meningococcal conjugate (MenACWY) vaccine  You may need this if you have certain conditions. Hepatitis A vaccine  You may need this if you have certain conditions or if you travel or work in places where you may be exposed  to hepatitis A. Hepatitis B vaccine  You may need this if you have certain conditions or if you travel or work in places where you may be exposed to hepatitis B. Haemophilus influenzae type b (Hib) vaccine  You may need this if you have certain conditions. You may receive vaccines as individual doses or as more than one vaccine together in one shot (combination vaccines). Talk with your health care provider about the risks and benefits of combination vaccines. What tests do I need? Blood tests  Lipid and cholesterol levels. These may be checked every 5 years, or more frequently depending on your overall health.  Hepatitis C test.  Hepatitis B test. Screening  Lung cancer screening. You may have this screening every year starting at age 77 if you have a 30-pack-year history of smoking and currently smoke or have quit within the past 15 years.  Colorectal cancer screening. All adults should have this screening starting at age 77 and continuing until age 77. Your health care provider may recommend screening at age 77 if you are at increased risk. You will have tests every 1-10 years, depending on your results and the type of screening test.  Diabetes screening. This is done by checking your blood sugar (glucose) after you have not eaten for a while (fasting). You may have this done every 1-3 years.  Mammogram. This may be done every 1-2 years. Talk with your health care provider about how often you should have regular mammograms.  BRCA-related cancer screening. This may be done if you have a family history of breast, ovarian, tubal, or peritoneal cancers.  Other tests  Sexually transmitted disease (STD) testing.  Bone density scan. This is done to screen for osteoporosis. You may have this done starting at age 77. Follow these instructions at home: Eating and drinking  Eat a diet that includes fresh fruits and vegetables, whole grains, lean protein, and low-fat dairy products. Limit  your intake of foods with high amounts of sugar, saturated fats, and salt.  Take vitamin and mineral supplements as recommended by your health care provider.  Do not drink alcohol if your health care provider tells you not to drink.  If you drink alcohol: ? Limit how much you have to 0-1 drink a day. ? Be aware of how much alcohol is in your drink. In the [place] one drink equals one 12 oz bottle of beer (355 mL), one 5 oz glass of wine (148 mL), or one 1 oz glass of hard liquor (44 mL). Lifestyle  Take daily care of your teeth and gums.  Stay active. Exercise for at least 30 minutes on 5 or more days each week.  Do not use any products that contain nicotine or tobacco, such as cigarettes, e-cigarettes, and chewing tobacco. If you need help quitting, ask your health care provider.  If you are sexually active, practice safe sex. Use a condom or other form of protection in order to prevent STIs (sexually transmitted infections).  Talk with your health care provider about taking a low-dose aspirin or statin. What's next?  Go to your health care provider once a year for a well check visit.  Ask your health care provider how often you should have your eyes and teeth checked.  Stay up to date on all vaccines. This information is not intended to replace advice given to you by your health care provider. Make sure you discuss any questions you have with your health care provider. Document Revised: 04/22/2018 Document Reviewed: 04/22/2018 Elsevier Patient Education  2020 [employer].

## 2019-10-13 NOTE — Progress Notes (Signed)
This visit occurred during the SARS-CoV-2 public health emergency.  Safety protocols were in place, including screening questions prior to the visit, additional usage of staff PPE, and extensive cleaning of exam room while observing appropriate contact time as indicated for disinfecting solutions.  Subjective:   Jasmine Lopez is a 79 y.o. female who presents for Medicare Annual (Subsequent) preventive examination.  Review of Systems:  n/a Cardiac Risk Factors include: advanced age (>63mn, >>65women);diabetes mellitus;hypertension;dyslipidemia;obesity (BMI >30kg/m2)     Objective:     Vitals: BP (!) 144/80 (BP Location: Left Arm, Patient Position: Sitting, Cuff Size: Normal)   Pulse 71   Temp 98.7 F (37.1 C) (Oral)   Ht 5' 2.8" (1.595 m)   Wt 194 lb (88 kg)   SpO2 96%   BMI 34.58 kg/m   Body mass index is 34.58 kg/m.  Advanced Directives 10/13/2019 02/14/2019 10/14/2018  Does Patient Have a Medical Advance Directive? No Yes Yes  Type of Advance Directive - [place]? - No - copy requested No - copy requested  Would patient like information on creating a medical advance directive? No - Patient declined - -    Tobacco Social History   Tobacco Use  Smoking Status Never Smoker  Smokeless Tobacco Never Used     Counseling given: Not Answered   Clinical Intake:  Pre-visit preparation completed: Yes  Pain : No/denies pain     Nutritional Status: BMI > 30  Obese Nutritional Risks: None Diabetes: Yes  How often do you need to have someone help you when you read instructions, pamphlets, or other written materials from your doctor or pharmacy?: 1 - Never What is the last grade level you completed in school?: advanced degree  Interpreter Needed?: No  Information entered by :: NAllen LPN  Past Medical History:  Diagnosis Date  . [place] without  complication ([place]   . Hypertension   . Irregular heart beat    Past Surgical History:  Procedure Laterality Date  . BREAST EXCISIONAL BIOPSY Bilateral    Family History  Problem Relation Age of Onset  . Heart failure Mother   . COPD Mother   . Cancer Father   . Rectal cancer Maternal Grandmother   . Heart attack Maternal Grandfather    Social History   Socioeconomic History  . Marital status: Widowed    Spouse name: Not on file  . Number of children: Not on file  . Years of education: Not on file  . Highest education level: Not on file  Occupational History  . Occupation: retired  Tobacco Use  . Smoking status: Never Smoker  . Smokeless tobacco: Never Used  Substance and Sexual Activity  . Alcohol use: Never  . Drug use: Never  . Sexual activity: Not Currently  Other Topics Concern  . Not on file  Social History Narrative   Lives home alone.  Widowed.  Retired.  MS counseling education. 4 cups coffee/ wk.     Social Determinants of Health   Financial Resource Strain: Low Risk   . Difficulty of Paying Living Expenses: Not hard at all  Food Insecurity: No Food Insecurity  . Worried About RCharity fundraiserin the Last Year: Never true  . Ran Out of Food in the Last Year: Never true  Transportation Needs: No Transportation Needs  . Lack of Transportation (Medical): No  . Lack of Transportation (  Non-Medical): No  Physical Activity: Sufficiently Active  . Days of Exercise per Week: 3 days  . Minutes of Exercise per Session: 60 min  Stress: No Stress Concern Present  . Feeling of Stress : Only a little  Social Connections:   . Frequency of Communication with Friends and Family:   . Frequency of Social Gatherings with Friends and Family:   . Attends Religious Services:   . Active Member of Clubs or Organizations:   . Attends Archivist Meetings:   Marland Kitchen Marital Status:     Outpatient Encounter Medications as of 10/13/2019  Medication Sig  . acetaminophen  (TYLENOL) 500 MG tablet Take 500 mg by mouth every 6 (six) hours as needed.  Marland Kitchen allopurinol (ZYLOPRIM) 100 MG tablet TAKE 1 TABLET BY MOUTH EVERY DAY  . amLODipine (NORVASC) 10 MG tablet Take 1 tablet (10 mg total) by mouth daily.  Marland Kitchen amoxicillin (AMOXIL) 500 MG tablet Take 4 tablets by mouth 1 hour prior to dental procedure  . aspirin 325 MG EC tablet Take 325 mg by mouth every 4 (four) hours as needed for pain.  . benzonatate (TESSALON) 200 MG capsule Take 1 capsule (200 mg total) by mouth 3 (three) times daily as needed for cough.  . Biotin 10000 MCG TBDP 1 qd  . blood glucose meter kit and supplies KIT Dispense based on patient and insurance preference. Use up to two times daily as directed. (FOR ICD-9 250.00, 250.01).  Marland Kitchen buPROPion (WELLBUTRIN XL) 150 MG 24 hr tablet Take 1 tablet (150 mg total) by mouth every morning.  . carvedilol (COREG) 6.25 MG tablet TAKE 1 TABLET BY MOUTH TWICE A DAY  . Cholecalciferol (VITAMIN D3) 2000 units TABS Take by mouth.  . famotidine (PEPCID) 20 MG tablet One after supper  . Garlic 6144 MG CAPS Take 1 capsule by mouth daily.  Marland Kitchen glucose blood (ONE TOUCH ULTRA TEST) test strip TEST FASTING BLOOD SUGAR AND AND 2 HOURS AFTER MEALS  . metFORMIN (GLUCOPHAGE) 500 MG tablet Take 500 mg by mouth daily.  Glory Rosebush Delica Lancets 31V MISC TEST FASTING BG AND 2 HOURS AFTER MEALS  . valsartan-hydrochlorothiazide (DIOVAN-HCT) 160-25 MG tablet Take 1 tablet by mouth daily.  . rosuvastatin (CRESTOR) 20 MG tablet Take 1 tablet (20 mg total) by mouth daily.  . Tdap (BOOSTRIX) 5-2.5-18.5 LF-MCG/0.5 injection Inject 0.5 mLs into the muscle once for 1 dose.   No facility-administered encounter medications on file as of 10/13/2019.    Activities of Daily Living In your present state of health, do you have any difficulty performing the following activities: 10/13/2019 10/14/2018  Hearing? N N  Vision? N N  Difficulty concentrating or making decisions? Y N  Comment concentration  since spouse died -  Walking or climbing stairs? N N  Dressing or bathing? N N  Doing errands, shopping? N N  Preparing Food and eating ? N N  Using the Toilet? N N  In the past six months, have you accidently leaked urine? N N  Do you have problems with loss of bowel control? N N  Managing your Medications? N N  Managing your Finances? N N  Housekeeping or managing your Housekeeping? N N  Some recent data might be hidden    Patient Care Team: Rodriguez-Southworth, Sandrea Matte as PCP - General (Internal Medicine) Skeet Latch, MD as PCP - Cardiology (Cardiology)    Assessment:   This is a routine wellness examination for [place].  Exercise Activities and Dietary recommendations  Current Exercise Habits: Home exercise routine, Type of exercise: walking, Time (Minutes): 60, Frequency (Times/Week): 3, Weekly Exercise (Minutes/Week): 180  Goals    . Blood Pressure < 130/80    . Patient Stated     10/13/2019, wants to have body stengthening, exercising and wants to get under 180 pounds    . Weight (lb) < 200 lb (90.7 kg)     Wants to lose 20 pounds       Fall Risk Fall Risk  10/13/2019 05/19/2019 01/20/2019 12/28/2018 12/07/2018  Falls in the past year? 1 1 0 0 0  Comment gardening - - - -  Number falls in past yr: 0 0 - - -  Injury with Fall? 1 1 - - -  Comment hurt r knee, had therapy - - - -  Risk for fall due to : History of fall(s);Medication side effect - - - -  Follow up Falls evaluation completed;Education provided;Falls prevention discussed - - - -   Is the patient's home free of loose throw rugs in walkways, pet beds, electrical cords, etc?   yes      Grab bars in the bathroom? yes      Handrails on the stairs?   yes      Adequate lighting?   yes  Timed Get Up and Go performed: n/a  Depression Screen PHQ 2/9 Scores 10/13/2019 08/01/2019 05/19/2019 01/20/2019  PHQ - 2 Score 0 0 0 0  PHQ- 9 Score - 8 - 0     Cognitive Function MMSE - Mini Mental State Exam  05/17/2019  Orientation to time 5  Orientation to Place 5  Registration 3  Attention/ Calculation 2  Recall 3  Language- name 2 objects 2  Language- repeat 1  Language- follow 3 step command 3  Language- read & follow direction 1  Write a sentence 1  Copy design 0  Total score 26     6CIT Screen 10/13/2019 10/14/2018  What Year? 0 points 0 points  What month? 0 points 0 points  What time? 0 points 0 points  Count back from 20 0 points 0 points  Months in reverse 2 points 0 points  Repeat phrase 0 points 0 points  Total Score 2 0     There is no immunization history on file for this patient.  Qualifies for Shingles Vaccine? yes  Screening Tests Health Maintenance  Topic Date Due  . COVID-19 Vaccine (1) Never done  . DEXA SCAN  Never done  . HEMOGLOBIN A1C  07/20/2019  . TETANUS/TDAP  10/12/2020 (Originally 08/31/1959)  . PNA vac Low Risk Adult (1 of 2 - PCV13) 10/12/2020 (Originally 09/01/2007)  . FOOT EXAM  10/14/2019  . OPHTHALMOLOGY EXAM  11/30/2019  . INFLUENZA VACCINE  12/11/2019    Cancer Screenings: Lung: Low Dose CT Chest recommended if Age 7-80 years, 30 pack-year currently smoking OR have quit w/in 15years. Patient does not qualify. Breast:  Up to date on Mammogram? Yes   Up to date of Bone Density/Dexa? Yes Colorectal: referred  Additional Screenings: : Hepatitis C Screening: n/a     Plan:    Patient states that she wants to gain strength, exercise and get under 180 pounds   I have personally reviewed and noted the following in the patient's chart:   . Medical and social history . Use of alcohol, tobacco or illicit drugs  . Current medications and supplements . Functional ability and status . Nutritional status . Physical activity . Advanced  directives . List of other physicians . Hospitalizations, surgeries, and ER visits in previous 12 months . Vitals . Screenings to include cognitive, depression, and falls . Referrals and appointments  In  addition, I have reviewed and discussed with patient certain preventive protocols, quality metrics, and best practice recommendations. A written personalized care plan for preventive services as well as general preventive health recommendations were provided to patient.     Kellie Simmering, LPN  12/16/3815

## 2019-10-13 NOTE — Progress Notes (Signed)
This visit occurred during the SARS-CoV-2 public health emergency.  Safety protocols were in place, including screening questions prior to the visit, additional usage of staff PPE, and extensive cleaning of exam room while observing appropriate contact time as indicated for disinfecting solutions.  Subjective:     Patient ID: Jasmine Lopez , female    DOB: Jan 23, 1941 , 79 y.o.   MRN: [id number]   Chief Complaint  Patient presents with  . Annual Exam    HPI  Pt is here for annual exam.  She has finished of speech therapy ordered by neurology and her cough and hoarseness has resolved. Her head scan was negative. She will FU with neurology 6/22 regarding her memory work up. Was told to do brain exercises and walk qd.  Her last pap 07/2018 which was told is her last one.  Has been seeing her chiropractor regularly for neck pain and he advised her to get an order from me for acupuncture and MRI of cervical and thoracic spine since she has not been responding to his adjustments. She handed me the letter addressed to me.  Past Medical History:  Diagnosis Date  . Diabetes mellitus without complication ([place])   . Hypertension   . Irregular heart beat      Family History  Problem Relation Age of Onset  . Heart failure Mother   . COPD Mother   . Cancer Father   . Rectal cancer Maternal Grandmother   . Heart attack Maternal Grandfather      Current Outpatient Medications:  .  acetaminophen (TYLENOL) 500 MG tablet, Take 500 mg by mouth every 6 (six) hours as needed., Disp: , Rfl:  .  allopurinol (ZYLOPRIM) 100 MG tablet, TAKE 1 TABLET BY MOUTH EVERY DAY, Disp: 90 tablet, Rfl: 0 .  amLODipine (NORVASC) 10 MG tablet, Take 1 tablet (10 mg total) by mouth daily., Disp: 90 tablet, Rfl: 3 .  amoxicillin (AMOXIL) 500 MG tablet, Take 4 tablets by mouth 1 hour prior to dental procedure, Disp: 4 tablet, Rfl: 0 .  aspirin 325 MG EC tablet, Take 325 mg by mouth every 4 (four) hours as needed for  pain., Disp: , Rfl:  .  benzonatate (TESSALON) 200 MG capsule, Take 1 capsule (200 mg total) by mouth 3 (three) times daily as needed for cough., Disp: 45 capsule, Rfl: 11 .  Biotin 10000 MCG TBDP, 1 qd, Disp: , Rfl:  .  blood glucose meter kit and supplies KIT, Dispense based on patient and insurance preference. Use up to two times daily as directed. (FOR ICD-9 250.00, 250.01)., Disp: 1 each, Rfl: 0 .  buPROPion (WELLBUTRIN XL) 150 MG 24 hr tablet, Take 1 tablet (150 mg total) by mouth every morning., Disp: 30 tablet, Rfl: 2 .  carvedilol (COREG) 6.25 MG tablet, TAKE 1 TABLET BY MOUTH TWICE A DAY, Disp: 180 tablet, Rfl: 3 .  Cholecalciferol (VITAMIN D3) 2000 units TABS, Take by mouth., Disp: , Rfl:  .  famotidine (PEPCID) 20 MG tablet, One after supper, Disp: 60 tablet, Rfl: 11 .  Garlic 4503 MG CAPS, Take 1 capsule by mouth daily., Disp: , Rfl:  .  glucose blood (ONE TOUCH ULTRA TEST) test strip, TEST FASTING BLOOD SUGAR AND AND 2 HOURS AFTER MEALS, Disp: , Rfl:  .  metFORMIN (GLUCOPHAGE) 500 MG tablet, Take 500 mg by mouth daily., Disp: , Rfl:  .  OneTouch Delica Lancets 88E MISC, TEST FASTING BG AND 2 HOURS AFTER MEALS, Disp: 100 each,  Rfl: 3 .  rosuvastatin (CRESTOR) 20 MG tablet, Take 1 tablet (20 mg total) by mouth daily., Disp: 30 tablet, Rfl: 5 .  Tdap (BOOSTRIX) 5-2.5-18.5 LF-MCG/0.5 injection, Inject 0.5 mLs into the muscle once for 1 dose., Disp: 0.5 mL, Rfl: 0 .  valsartan-hydrochlorothiazide (DIOVAN-HCT) 160-25 MG tablet, Take 1 tablet by mouth daily., Disp: 90 tablet, Rfl: 3   Allergies  Allergen Reactions  . Ace Inhibitors Cough  . Atorvastatin Other (See Comments)    Leg pain   . Benadryl [Diphenhydramine] Itching    Benadryl cream     Review of Systems  + for occasional cough just to clear her throat, but as long as she does her breathing exercises, her hoarseness resolves. Has been seeing a chiropractor for her mid back and upper shoulder pain and he recommends MRI of  neck and thoracic spine since she is not responding  And would like her to try acupuncture and needs an order.  Today's Vitals   10/13/19 1520  BP: (!) 144/80  Temp: 98.7 F (37.1 C)  TempSrc: Oral  SpO2: 96%  Weight: 194 lb (88 kg)  Height: 5' 2.8" (1.595 m)   Body mass index is 34.58 kg/m.   Objective:  Physical Exam  BP (!) 144/80 (Patient Position: Sitting)   Temp 98.7 F (37.1 C) (Oral)   Ht 5' 2.8" (1.595 m)   Wt 194 lb (88 kg)   SpO2 96%   BMI 34.58 kg/m   General Appearance:    Alert, cooperative, no distress, appears stated age  Head:    Normocephalic, without obvious abnormality, atraumatic  Eyes:    PERRL, conjunctiva/corneas clear, EOM's intact, fundi    benign, both eyes  Ears:    Normal TM's and external ear canals, both ears  Nose:   Nares normal, septum midline, mucosa normal, no drainage    or sinus tenderness  Throat:   Lips, mucosa, and tongue normal; teeth and gums normal  Neck:   Some decreased ROM noted with tights trapezius, symmetrical, trachea midline, no adenopathy;    thyroid:  no enlargement/tenderness/nodules; no carotid   bruit  Back:     Symmetric, seems to have mild kyphosis, ROM normal. Has tense and tender mid trapezius muscles with vertebral tenderness on upper thoracic region.   Lungs:     Clear to auscultation bilaterally, respirations unlabored  Chest Wall:    No tenderness or deformity   Heart:    Regular rate and rhythm, S1 and S2 normal, 2-3/6 murmur, rub   or gallop  Breast Exam:    No tenderness, masses, or nipple abnormality  Abdomen:     Soft, non-tender, bowel sounds active all four quadrants,    no masses, no organomegaly        Extremities:   Extremities normal, atraumatic, no cyanosis or edema  Pulses:   2+ and symmetric all extremities  Skin:   Skin color, texture, turgor normal, no rashes or lesions  Lymph nodes:   Cervical, supraclavicular, and axillary nodes normal  Neurologic:   CNII-XII intact, normal strength,  sensation and reflexes    Throughout. Normal Romberg, tandem gait, finger to nose, heel and tip toe gait.       Assessment And Plan:     1. Routine medical exam- routine - CMP14 + Anion Gap - CBC with Diff - TSH - Lipid Profile - Urine Culture  2. Chronic neck pain- chronic. I ordered acupuncture and the cervical MRI recommended by her chiroprctor.  3. Chronic midline thoracic back pain- chronic. I handed her a written order for acupuncture requested by her chiropractor. I also ordered a thoracic MRI.   4. Mixed hyperlipidemia- chronic. May continue current treatment and Fu with her cardiologist.   5. Heart murmur- chronic and stable. NO action.   6. Decreased estrogen- Dexa ordered.      We will inform her when the results are back.  Murry Diaz RODRIGUEZ-SOUTHWORTH, PA-C    THE PATIENT IS ENCOURAGED TO PRACTICE SOCIAL DISTANCING DUE TO THE COVID-19 PANDEMIC.

## 2019-10-13 NOTE — Patient Instructions (Addendum)
Jasmine Lopez , Thank you for taking time to come for your Medicare Wellness Visit. I appreciate your ongoing commitment to your health goals. Please review the following plan we discussed and let me know if I can assist you in the future.   Screening recommendations/referrals: Colonoscopy: referred Mammogram: 02/2019 Bone Density: states had Recommended yearly ophthalmology/optometry visit for glaucoma screening and checkup Recommended yearly dental visit for hygiene and checkup  Vaccinations: Influenza vaccine: decline Pneumococcal vaccine: decline Tdap vaccine: declined Shingles vaccine: discussed    Advanced directives: Advance directive discussed with you today. Even though you declined this today please call our office should you change your mind and we can give you the proper paperwork for you to fill out.  Conditions/risks identified: obesity  Next appointment: 10/24/2020 at 2:30   Preventive Care 77 Years and Older, Female Preventive care refers to lifestyle choices and visits with your health care provider that can promote health and wellness. What does preventive care include?  A yearly physical exam. This is also called an annual well check.  Dental exams once or twice a year.  Routine eye exams. Ask your health care provider how often you should have your eyes checked.  Personal lifestyle choices, including:  Daily care of your teeth and gums.  Regular physical activity.  Eating a healthy diet.  Avoiding tobacco and drug use.  Limiting alcohol use.  Practicing safe sex.  Taking low-dose aspirin every day.  Taking vitamin and mineral supplements as recommended by your health care provider. What happens during an annual well check? The services and screenings done by your health care provider during your annual well check will depend on your age, overall health, lifestyle risk factors, and family history of disease. Counseling  Your health care provider may  ask you questions about your:  Alcohol use.  Tobacco use.  Drug use.  Emotional well-being.  Home and relationship well-being.  Sexual activity.  Eating habits.  History of falls.  Memory and ability to understand (cognition).  Work and work Statistician.  Reproductive health. Screening  You may have the following tests or measurements:  Height, weight, and BMI.  Blood pressure.  Lipid and cholesterol levels. These may be checked every 5 years, or more frequently if you are over 64 years old.  Skin check.  Lung cancer screening. You may have this screening every year starting at age 29 if you have a 30-pack-year history of smoking and currently smoke or have quit within the past 15 years.  Fecal occult blood test (FOBT) of the stool. You may have this test every year starting at age 25.  Flexible sigmoidoscopy or colonoscopy. You may have a sigmoidoscopy every 5 years or a colonoscopy every 10 years starting at age 52.  Hepatitis C blood test.  Hepatitis B blood test.  Sexually transmitted disease (STD) testing.  Diabetes screening. This is done by checking your blood sugar (glucose) after you have not eaten for a while (fasting). You may have this done every 1-3 years.  Bone density scan. This is done to screen for osteoporosis. You may have this done starting at age 77.  Mammogram. This may be done every 1-2 years. Talk to your health care provider about how often you should have regular mammograms. Talk with your health care provider about your test results, treatment options, and if necessary, the need for more tests. Vaccines  Your health care provider may recommend certain vaccines, such as:  Influenza vaccine. This is recommended every  year.  Tetanus, diphtheria, and acellular pertussis (Tdap, Td) vaccine. You may need a Td booster every 10 years.  Zoster vaccine. You may need this after age 68.  Pneumococcal 13-valent conjugate (PCV13) vaccine. One  dose is recommended after age 5.  Pneumococcal polysaccharide (PPSV23) vaccine. One dose is recommended after age 48. Talk to your health care provider about which screenings and vaccines you need and how often you need them. This information is not intended to replace advice given to you by your health care provider. Make sure you discuss any questions you have with your health care provider. Document Released: 05/25/2015 Document Revised: 01/16/2016 Document Reviewed: 02/27/2015 Elsevier Interactive Patient Education  2017 [place] Prevention in the Home Falls can cause injuries. They can happen to people of all ages. There are many things you can do to make your home safe and to help prevent falls. What can I do on the outside of my home?  Regularly fix the edges of walkways and driveways and fix any cracks.  Remove anything that might make you trip as you walk through a door, such as a raised step or threshold.  Trim any bushes or trees on the path to your home.  Use bright outdoor lighting.  Clear any walking paths of anything that might make someone trip, such as rocks or tools.  Regularly check to see if handrails are loose or broken. Make sure that both sides of any steps have handrails.  Any raised decks and porches should have guardrails on the edges.  Have any leaves, snow, or ice cleared regularly.  Use sand or salt on walking paths during winter.  Clean up any spills in your garage right away. This includes oil or grease spills. What can I do in the bathroom?  Use night lights.  Install grab bars by the toilet and in the tub and shower. Do not use towel bars as grab bars.  Use non-skid mats or decals in the tub or shower.  If you need to sit down in the shower, use a plastic, non-slip stool.  Keep the floor dry. Clean up any water that spills on the floor as soon as it happens.  Remove soap buildup in the tub or shower regularly.  Attach bath  mats securely with double-sided non-slip rug tape.  Do not have throw rugs and other things on the floor that can make you trip. What can I do in the bedroom?  Use night lights.  Make sure that you have a light by your bed that is easy to reach.  Do not use any sheets or blankets that are too big for your bed. They should not hang down onto the floor.  Have a firm chair that has side arms. You can use this for support while you get dressed.  Do not have throw rugs and other things on the floor that can make you trip. What can I do in the kitchen?  Clean up any spills right away.  Avoid walking on wet floors.  Keep items that you use a lot in easy-to-reach places.  If you need to reach something above you, use a strong step stool that has a grab bar.  Keep electrical cords out of the way.  Do not use floor polish or wax that makes floors slippery. If you must use wax, use non-skid floor wax.  Do not have throw rugs and other things on the floor that can make you trip. What can  I do with my stairs?  Do not leave any items on the stairs.  Make sure that there are handrails on both sides of the stairs and use them. Fix handrails that are broken or loose. Make sure that handrails are as long as the stairways.  Check any carpeting to make sure that it is firmly attached to the stairs. Fix any carpet that is loose or worn.  Avoid having throw rugs at the top or bottom of the stairs. If you do have throw rugs, attach them to the floor with carpet tape.  Make sure that you have a light switch at the top of the stairs and the bottom of the stairs. If you do not have them, ask someone to add them for you. What else can I do to help prevent falls?  Wear shoes that:  Do not have high heels.  Have rubber bottoms.  Are comfortable and fit you well.  Are closed at the toe. Do not wear sandals.  If you use a stepladder:  Make sure that it is fully opened. Do not climb a closed  stepladder.  Make sure that both sides of the stepladder are locked into place.  Ask someone to hold it for you, if possible.  Clearly mark and make sure that you can see:  Any grab bars or handrails.  First and last steps.  Where the edge of each step is.  Use tools that help you move around (mobility aids) if they are needed. These include:  Canes.  Walkers.  Scooters.  Crutches.  Turn on the lights when you go into a dark area. Replace any light bulbs as soon as they burn out.  Set up your furniture so you have a clear path. Avoid moving your furniture around.  If any of your floors are uneven, fix them.  If there are any pets around you, be aware of where they are.  Review your medicines with your doctor. Some medicines can make you feel dizzy. This can increase your chance of falling. Ask your doctor what other things that you can do to help prevent falls. This information is not intended to replace advice given to you by your health care provider. Make sure you discuss any questions you have with your health care provider. Document Released: 02/22/2009 Document Revised: 10/04/2015 Document Reviewed: 06/02/2014 Elsevier Interactive Patient Education  2017 [employer].

## 2019-10-13 NOTE — Addendum Note (Signed)
Addended by: Kellie Simmering on: 10/13/2019 04:28 PM   Modules accepted: Orders

## 2019-10-14 ENCOUNTER — Ambulatory Visit: Payer: Medicare Other | Admitting: Internal Medicine

## 2019-10-14 LAB — CMP14 + ANION GAP
ALT: 18 IU/L (ref 0–32)
AST: 19 IU/L (ref 0–40)
Albumin/Globulin Ratio: 1.8 (ref 1.2–2.2)
Albumin: 4.5 g/dL (ref 3.7–4.7)
Alkaline Phosphatase: 64 IU/L (ref 48–121)
Anion Gap: 12 mmol/L (ref 10.0–18.0)
BUN/Creatinine Ratio: 14 (ref 12–28)
BUN: 13 mg/dL (ref 8–27)
Bilirubin Total: 0.3 mg/dL (ref 0.0–1.2)
CO2: 26 mmol/L (ref 20–29)
Calcium: 9.5 mg/dL (ref 8.7–10.3)
Chloride: 98 mmol/L (ref 96–106)
Creatinine, Ser: 0.94 mg/dL (ref 0.57–1.00)
GFR calc Af Amer: 68 mL/min/{1.73_m2} (ref >59)
GFR calc non Af Amer: 59 mL/min/{1.73_m2} — ABNORMAL LOW (ref >59)
Globulin, Total: 2.5 g/dL (ref 1.5–4.5)
Glucose: 111 mg/dL — ABNORMAL HIGH (ref 65–99)
Potassium: 4.2 mmol/L (ref 3.5–5.2)
Sodium: 136 mmol/L (ref 134–144)
Total Protein: 7 g/dL (ref 6.0–8.5)

## 2019-10-14 LAB — CBC WITH DIFFERENTIAL/PLATELET
Basophils Absolute: 0.1 10*3/uL (ref 0.0–0.2)
Basos: 1 %
EOS (ABSOLUTE): 0.2 10*3/uL (ref 0.0–0.4)
Eos: 4 %
Hematocrit: 40.3 % (ref 34.0–46.6)
Hemoglobin: 13.6 g/dL (ref 11.1–15.9)
Immature Grans (Abs): 0 10*3/uL (ref 0.0–0.1)
Immature Granulocytes: 0 %
Lymphocytes Absolute: 3 10*3/uL (ref 0.7–3.1)
Lymphs: 50 %
MCH: 30.1 pg (ref 26.6–33.0)
MCHC: 33.7 g/dL (ref 31.5–35.7)
MCV: 89 fL (ref 79–97)
Monocytes Absolute: 0.6 10*3/uL (ref 0.1–0.9)
Monocytes: 9 %
Neutrophils Absolute: 2.2 10*3/uL (ref 1.4–7.0)
Neutrophils: 36 %
Platelets: 224 10*3/uL (ref 150–450)
RBC: 4.52 x10E6/uL (ref 3.77–5.28)
RDW: 12.8 % (ref 11.7–15.4)
WBC: 6.1 10*3/uL (ref 3.4–10.8)

## 2019-10-14 LAB — LIPID PANEL
Chol/HDL Ratio: 2.5 ratio (ref 0.0–4.4)
Cholesterol, Total: 126 mg/dL (ref 100–199)
HDL: 50 mg/dL (ref >39)
LDL Chol Calc (NIH): 64 mg/dL (ref 0–99)
Triglycerides: 56 mg/dL (ref 0–149)
VLDL Cholesterol Cal: 12 mg/dL (ref 5–40)

## 2019-10-14 LAB — URINE CULTURE

## 2019-10-14 LAB — TSH: TSH: 1.22 u[IU]/mL (ref 0.450–4.500)

## 2019-10-17 NOTE — Progress Notes (Signed)
[facility] PREP Weekly Session   Patient Details  Name: MICOLE DELEHANTY MRN: [id number] Date of Birth: 1940-08-21 Age: 79 y.o. PCP: Shelby Mattocks, PA-C  There were no vitals filed for this visit.  [facility] Weekly seesion - 10/17/19 1600      Weekly Session   Topic Discussed  Restaurant Eating   Salt demo   Minutes exercised this week  140 minutes    Classes attended to date  65      Fun things since last meeting: went to 2nd [employer] graduation Grateful for: always for life, health and strength, family members Nutrition celebration: bought kefir daily 3-4 oz glass and can walk Barriers/struggles: had too many salty foods   Pam Tally Joe 10/17/2019, 4:30 PM

## 2019-10-18 ENCOUNTER — Encounter: Payer: Self-pay | Admitting: Internal Medicine

## 2019-10-20 ENCOUNTER — Encounter: Payer: Medicare Other | Admitting: Internal Medicine

## 2019-10-20 ENCOUNTER — Ambulatory Visit: Payer: Medicare Other

## 2019-10-20 ENCOUNTER — Telehealth: Payer: Self-pay | Admitting: Cardiovascular Disease

## 2019-10-20 DIAGNOSIS — I739 Peripheral vascular disease, unspecified: Secondary | ICD-10-CM | POA: Diagnosis not present

## 2019-10-20 DIAGNOSIS — I70223 Atherosclerosis of native arteries of extremities with rest pain, bilateral legs: Secondary | ICD-10-CM | POA: Diagnosis not present

## 2019-10-20 DIAGNOSIS — I70203 Unspecified atherosclerosis of native arteries of extremities, bilateral legs: Secondary | ICD-10-CM | POA: Diagnosis not present

## 2019-10-20 NOTE — Telephone Encounter (Signed)
New Message  Pt would like to discuss with Dr. Oval Linsey and nurse a recommendation that came from another doctor. Would like to get Dr. Oval Linsey and nurse opinion before going to the other doctor's choice. Please call to discuss asap

## 2019-10-20 NOTE — Telephone Encounter (Signed)
Spoke with patient. Patient was seen by podiatry who recommended she see someone for PAD and wericose veins. Patient wanted Dr. Blenda Mounts opinion and referral if needed. Referred back to patient's last office visit with Dr. Oval Linsey per office visit note:  "Varicose Veins: Recommend that she continue compression stockings and follow-up with [facility]."  Patient reports she will call [facility]. No further questions at this time.

## 2019-10-24 NOTE — Progress Notes (Signed)
[facility] PREP Weekly Session   Patient Details  Name: Jasmine Lopez MRN: [id number] Date of Birth: March 07, 1941 Age: 79 y.o. PCP: Shelby Mattocks, PA-C  Vitals:   10/24/19 1600  BP: (!) 154/74  Weight: 199 lb (90.3 kg)     [facility] Weekly seesion - 10/24/19 1600      Weekly Session   Topic Discussed Stress management and problem solving    Minutes exercised this week 130 minutes    Classes attended to date 70          Fun things since last meeting: I help son do lawn work Geographical information systems officer for: the weather being 80's not 90's I had only 1 fast food meal Nutrition celebration: I ate less salt and sugar over the weekend Barriers/struggles: I can't loose any pounds/weight   Will focus on sleep at a priority and stress reduction.   [name] 10/24/2019, 4:02 PM

## 2019-10-31 NOTE — Progress Notes (Signed)
[facility] PREP Weekly Session   Patient Details  Name: Jasmine Lopez MRN: [id number] Date of Birth: [birth year] Age: 79 y.o. PCP: Shelby Mattocks, PA-C  Vitals:   10/31/19 1544  BP: (!) 146/67  Pulse: 67     [facility] Weekly seesion - 10/31/19 1500      Weekly Session   Topic Discussed Expectations and non-scale victories    Minutes exercised this week 160 minutes    Classes attended to date 87          Fun things since last meeting: I walked in house during commercials Grateful for: I am feeling better! Nutrition celebration: ate less carbs, down a few pounds Barriers/struggles: being consistent with meds, exercise and foods   [name] 10/31/2019, 3:45 PM

## 2019-10-31 NOTE — Progress Notes (Signed)
PATIENT: FLORIENE Lopez DOB: [birth year]  REASON FOR VISIT: follow up HISTORY FROM: patient  HISTORY OF PRESENT ILLNESS: Today 11/01/19  HISTORY  Jasmine Lopez is a 79 year old female, seen in request by his primary care physician Dr., Jonelle Sidle for evaluation of mild cognitive impairment, he is accompanied by his son at today's clinical visit on May 17, 2019.  I have reviewed and summarized the referring note from the referring physician.  She has past medical history of hypertension, hyperlipidemia, diabetes, left breast calcified mass surgery,  She retired as a Management consultant at age 89, she went through excessive stress in early 2019, her husband, and her mother passed away few months apart, she moved from [place] to [place] to be close to her son, she lives independently, still driving, tends to rely on her GPS, she has good appetite, has trouble sleeping, tends to be sedentary,  She was noted to have gradual onset mild cognitive malfunction around 2016, she has word finding difficulties, sometimes difficulty to carry on her train of thoughts, only has mild memory difficulty noted.  There was no family history of memory loss  Laboratory evaluations in September 2020 showed normal CBC, CMP showed elevated glucose 136, LDL 132, normal thyroid functional test, A1c was 6.9  Update November 01, 2019 SS: Here today for follow-up unaccompanied.  Indicates she is doing much better.  There was a time when she took herself off Wellbutrin, since being back on, feels her focus is better, processing better.  She had significant stress in 2019 with loss of her husband and mother very close together, is now healing from this, is more confident in her decision making.  Her overall health has been good.  [facility], she loves it, plans to continue independently.  Doing well living on her own, is  actually managing buying her mothers home, this is a big step for her and process to manage.  Does her own ADLs, drives a car, uses GPS.  No family history of memory troubles, her mother was 51, memory was very sharp.  MMSE 26/30. She reads, does puzzles often.  MRI of the brain was unremarkable, B12 was normal.  REVIEW OF SYSTEMS: Out of a complete 14 system review of symptoms, the patient complains only of the following symptoms, and all other reviewed systems are negative.  N/A  ALLERGIES: Allergies  Allergen Reactions  . Ace Inhibitors Cough  . Atorvastatin Other (See Comments)    Leg pain   . Benadryl [Diphenhydramine] Itching    Benadryl cream  . Peppermint Flavor [Flavoring Agent]     Causes horsenss    HOME MEDICATIONS: Outpatient Medications Prior to Visit  Medication Sig Dispense Refill  . acetaminophen (TYLENOL) 500 MG tablet Take 500 mg by mouth every 6 (six) hours as needed.    Marland Kitchen allopurinol (ZYLOPRIM) 100 MG tablet TAKE 1 TABLET BY MOUTH EVERY DAY 90 tablet 0  . amoxicillin (AMOXIL) 500 MG tablet Take 4 tablets by mouth 1 hour prior to dental procedure 4 tablet 0  . aspirin 325 MG EC tablet Take 325 mg by mouth every 4 (four) hours as needed for pain.    . benzonatate (TESSALON) 200 MG capsule Take 1 capsule (200 mg total) by mouth 3 (three) times daily as needed for cough. 45 capsule 11  . Biotin 10000 MCG TBDP 1 qd    . blood glucose meter  kit and supplies KIT Dispense based on patient and insurance preference. Use up to two times daily as directed. (FOR ICD-9 250.00, 250.01). 1 each 0  . buPROPion (WELLBUTRIN XL) 150 MG 24 hr tablet Take 1 tablet (150 mg total) by mouth every morning. 30 tablet 2  . carvedilol (COREG) 6.25 MG tablet TAKE 1 TABLET BY MOUTH TWICE A DAY 180 tablet 3  . Cholecalciferol (VITAMIN D3) 2000 units TABS Take by mouth.    . famotidine (PEPCID) 20 MG tablet One after supper 60 tablet 11  . Garlic 3559 MG CAPS Take 1 capsule by mouth daily.    Marland Kitchen  glucose blood (ONE TOUCH ULTRA TEST) test strip TEST FASTING BLOOD SUGAR AND AND 2 HOURS AFTER MEALS    . metFORMIN (GLUCOPHAGE) 500 MG tablet Take 500 mg by mouth daily.    Glory Rosebush Delica Lancets 74B MISC TEST FASTING BG AND 2 HOURS AFTER MEALS 100 each 3  . valsartan-hydrochlorothiazide (DIOVAN-HCT) 160-25 MG tablet Take 1 tablet by mouth daily. 90 tablet 3  . rosuvastatin (CRESTOR) 20 MG tablet Take 1 tablet (20 mg total) by mouth daily. 30 tablet 5  . amLODipine (NORVASC) 10 MG tablet Take 1 tablet (10 mg total) by mouth daily. 90 tablet 3   No facility-administered medications prior to visit.    PAST MEDICAL HISTORY: Past Medical History:  Diagnosis Date  . Diabetes mellitus without complication ([place])   . Hypertension   . Irregular heart beat     PAST SURGICAL HISTORY: Past Surgical History:  Procedure Laterality Date  . BREAST EXCISIONAL BIOPSY Bilateral     FAMILY HISTORY: Family History  Problem Relation Age of Onset  . Heart failure Mother   . COPD Mother   . Cancer Father   . Rectal cancer Maternal Grandmother   . Heart attack Maternal Grandfather     SOCIAL HISTORY: Social History   Socioeconomic History  . Marital status: Widowed    Spouse name: Not on file  . Number of children: Not on file  . Years of education: Not on file  . Highest education level: Not on file  Occupational History  . Occupation: retired  Tobacco Use  . Smoking status: Never Smoker  . Smokeless tobacco: Never Used  Vaping Use  . Vaping Use: Never used  Substance and Sexual Activity  . Alcohol use: Never  . Drug use: Never  . Sexual activity: Not Currently  Other Topics Concern  . Not on file  Social History Narrative   Lives home alone.  Widowed.  Retired.  MS counseling education. 4 cups coffee/ wk.     Social Determinants of Health   Financial Resource Strain: Low Risk   . Difficulty of Paying Living Expenses: Not hard at all  Food Insecurity: No Food Insecurity  .  Worried About Charity fundraiser in the Last Year: Never true  . Ran Out of Food in the Last Year: Never true  Transportation Needs: No Transportation Needs  . Lack of Transportation (Medical): No  . Lack of Transportation (Non-Medical): No  Physical Activity: Sufficiently Active  . Days of Exercise per Week: 3 days  . Minutes of Exercise per Session: 60 min  Stress: No Stress Concern Present  . Feeling of Stress : Only a little  Social Connections:   . Frequency of Communication with Friends and Family:   . Frequency of Social Gatherings with Friends and Family:   . Attends Religious Services:   . Active  Member of Clubs or Organizations:   . Attends Archivist Meetings:   Marland Kitchen Marital Status:   Intimate Partner Violence:   . Fear of Current or Ex-Partner:   . Emotionally Abused:   Marland Kitchen Physically Abused:   . Sexually Abused:    PHYSICAL EXAM  Vitals:   11/01/19 0732  BP: 138/69  Pulse: (!) 58  Weight: 193 lb (87.5 kg)  Height: _0  (1.6 m)   Body mass index is 34.19 kg/m.  Generalized: Well developed, in no acute distress  MMSE - Mini Mental State Exam 11/01/2019 05/17/2019  Orientation to time 5 5  Orientation to Place 5 5  Registration 3 3  Attention/ Calculation 2 2  Recall 2 3  Language- name 2 objects 2 2  Language- repeat 1 1  Language- follow 3 step command 3 3  Language- read & follow direction 1 1  Write a sentence 1 1  Copy design 1 0  Copy design-comments 10 animals -  Total score 26 26    Neurological examination  Mentation: Alert oriented to time, place, history taking. Follows all commands speech and language fluent Cranial nerve II-XII: Pupils were equal round reactive to light. Extraocular movements were full, visual field were full on confrontational test. Facial sensation and strength were normal.  Head turning and shoulder shrug  were normal and symmetric. Motor: The motor testing reveals 5 over 5 strength of all 4 extremities. Good  symmetric motor tone is noted throughout.  Sensory: Sensory testing is intact to soft touch on all 4 extremities. No evidence of extinction is noted.  Coordination: Cerebellar testing reveals good finger-nose-finger and heel-to-shin bilaterally.  Gait and station: Gait is normal.  Reflexes: Deep tendon reflexes are symmetric and normal bilaterally.   DIAGNOSTIC DATA (LABS, IMAGING, TESTING) - I reviewed patient records, labs, notes, testing and imaging myself where available.  Lab Results  Component Value Date   WBC 6.1 10/13/2019   HGB 13.6 10/13/2019   HCT 40.3 10/13/2019   MCV 89 10/13/2019   PLT 224 10/13/2019      Component Value Date/Time   NA 136 10/13/2019 1556   K 4.2 10/13/2019 1556   CL 98 10/13/2019 1556   CO2 26 10/13/2019 1556   GLUCOSE 111 (H) 10/13/2019 1556   BUN 13 10/13/2019 1556   CREATININE 0.94 10/13/2019 1556   CALCIUM 9.5 10/13/2019 1556   PROT 7.0 10/13/2019 1556   ALBUMIN 4.5 10/13/2019 1556   AST 19 10/13/2019 1556   ALT 18 10/13/2019 1556   ALKPHOS 64 10/13/2019 1556   BILITOT 0.3 10/13/2019 1556   GFRNONAA 59 (L) 10/13/2019 1556   GFRAA 68 10/13/2019 1556   Lab Results  Component Value Date   CHOL 126 10/13/2019   HDL 50 10/13/2019   LDLCALC 64 10/13/2019   TRIG 56 10/13/2019   CHOLHDL 2.5 10/13/2019   Lab Results  Component Value Date   HGBA1C 6.9 (H) 01/20/2019   Lab Results  Component Value Date   VITAMINB12 670 05/17/2019   Lab Results  Component Value Date   TSH 1.220 10/13/2019      ASSESSMENT AND PLAN 79 y.o. year old female  has a past medical history of Diabetes mellitus without complication ([place]), Hypertension, and Irregular heart beat. here with:  1.  Mild cognitive impairment -Overall, seems to be stable, feels her focus, memory, processing has improved, is back on Wellbutrin, exercising, MMSE 26/30 today -Stress, mood disorder related, versus early onset central nervous  system degenerative disorder (none  reported family history of memory trouble) -MRI of the brain was unremarkable, mild age-appropriate changes of chronic small vessel disease and supratentorial cortical atrophy -Vitamin B12 was normal -Encouraged to continue exercise, brain stimulating activities, routine follow-up with cardiology and PCP, will focus on lifestyle management to promote memory  -We will follow-up in 1 year or sooner if needed  I spent 30 minutes of face-to-face and non-face-to-face time with patient.  This included previsit chart review, lab review, study review, order entry, electronic health record documentation, patient education.  Butler Denmark, AGNP-C, DNP 11/01/2019, 7:49 AM [facility] [address] [place], [place] [address] [phone]

## 2019-11-01 ENCOUNTER — Encounter: Payer: Self-pay | Admitting: Internal Medicine

## 2019-11-01 ENCOUNTER — Ambulatory Visit: Payer: Medicare PPO | Admitting: Internal Medicine

## 2019-11-01 ENCOUNTER — Encounter: Payer: Self-pay | Admitting: Neurology

## 2019-11-01 ENCOUNTER — Ambulatory Visit: Payer: Medicare PPO | Admitting: Neurology

## 2019-11-01 ENCOUNTER — Other Ambulatory Visit: Payer: Self-pay

## 2019-11-01 VITALS — BP 138/69 | HR 58 | Ht 63.0 in | Wt 193.0 lb

## 2019-11-01 DIAGNOSIS — R413 Other amnesia: Secondary | ICD-10-CM | POA: Diagnosis not present

## 2019-11-01 DIAGNOSIS — R05 Cough: Secondary | ICD-10-CM

## 2019-11-01 DIAGNOSIS — R058 Other specified cough: Secondary | ICD-10-CM

## 2019-11-01 NOTE — Patient Instructions (Signed)
Continue current medications  Continue exercise as you are doing now  Continue seeing your primary doctor  See you back in 1 year or sooner if needed

## 2019-11-01 NOTE — Progress Notes (Addendum)
Jasmine Lopez, female    DOB: 08/19/1940,     MRN: [id number]   Brief patient profile:  30 yobf never smoker hay fever as child better p left [place] in her 98s and then started pattern of chronic cough after h1n1 and worse  fall 2018 so referred to pulmonary clinic 02/02/2019      History of Present Illness  02/02/2019  Pulmonary/ 1st office eval/Jasmine Lopez  Chief Complaint  Patient presents with  . Pulmonary Consult    Self referral. Pt c/o cough x 2 yrs- non prod and occurs when she is exposed to certain smells and occ when she lies down.   cough is daily assoc hoarseness with ent eval 11/14/1023  [facility]  "neg" - no notes in care everywhere Dyspnea:  Can walk up to an hour  Cough: dry hack/ better while asleep and not waking her up / worse with smells certain foods with sense of globus worse immediately when lies down then resolves within  Few min and does not disturb sleep Sleep: sits up for a while immediately then later able to lie flat/ sleep fine SABA use: none  Prednisone helped / mint or water helps rec The key to effective treatment for your cough is eliminating   First take tessalon 200 mg up to every 6 hour until no cough at all x 3 straight days  Prednisone 10 mg take  4 each am x 2 days,   2 each am x 2 days,  1 each am x 2 days and stop (this is to eliminate allergies and inflammation from coughing) Protonix (pantoprazole) Take 30-60 min before first meal of the day and Pepcid 20 mg one bedtime plus chlorpheniramine 4 mg x1 at bedtime GERD (REFLUX)  Please schedule a follow up office visit in 4 weeks, sooner if needed  with all medications /inhalers/ solutions in hand so we can verify exactly what you are taking. This includes all medications from all doctors and over the counters      03/04/2019  f/u ov/Jasmine Lopez re:  cough x 2016 off ppi x two weeks Chief Complaint  Patient presents with  . Follow-up    Cough is 50% better since the last visit.    Dyspnea:  Not  limited by breathing from desired activities   Cough: dry  Very sensitive to temp changes and perfume Sleeping: sleeps fine  SABA use: none  02: none  Has not tried 1st gen H1 blockers per recs   rec An hour before bed take pepcid 20 mg and chlorpheniramine 4 mg  For drainage / throat tickle try take CHLORPHENIRAMINE  4 mg  (Chlortab 46m     Please schedule a follow up office visit in 6 weeks, call sooner if needed with all medications /inhalers/ solutions in hand     04/15/2019  f/u ov/Jasmine Lopez re: cough x 2016 / did not bring any meds/not sure she has chlorpheniramine Chief Complaint  Patient presents with  . Follow-up    Cough is some better. Still has some hoarseness.   Dyspnea:  Not limited by breathing from desired activities  But very limited with activity since covid Cough: resolved to her satisfaction/ just throat clearing / better with tessalon  Sleeping: fine /sleeping 30 degrees electric bed  SABA use: none 02: none  rec Change pepcid (famotdine) 20 mg after bfast and supper  Remember to keep the candy handy  Do not refill pantoprazole  For drainage / throat tickle try take  CHLORPHENIRAMINE  4 mg  (Chlortab 12m  at [employer] be easiest to find in the green box)  Please schedule a follow up visit in 6  months but call sooner if needed     11/01/2019  f/u ov/Jasmine Lopez re:  Cough x 2016  March 2nd shot pMedical illustratorComplaint  Patient presents with  . Follow-up    pt is doing well. Denies any congestion,coughing or reflux.  Dyspnea:  Not limited by breathing from desired activities   Cough: better p ST / worse with voice use or smells or gets excited  Sleeping: no resp symptoms  SABA use: none  02: none  pepcid as needed    No obvious day to day or daytime variability or assoc excess/ purulent sputum or mucus plugs or hemoptysis or cp or chest tightness, subjective wheeze or overt sinus or hb symptoms.   Sleeping  without nocturnal  or early am  exacerbation  of respiratory  c/o's or need for noct saba. Also denies any obvious fluctuation of symptoms with weather or environmental changes or other aggravating or alleviating factors except as outlined above   No unusual exposure hx or h/o childhood pna/ asthma or knowledge of premature birth.  Current Allergies, Complete Past Medical History, Past Surgical History, Family History, and Social History were reviewed in [employer].  ROS  The following are not active complaints unless bolded Hoarseness, sore throat, dysphagia, dental problems, itching, sneezing,  nasal congestion or discharge of excess mucus or purulent secretions, ear ache,   fever, chills, sweats, unintended wt loss or wt gain, classically pleuritic or exertional cp,  orthopnea pnd or arm/hand swelling  or leg swelling, presyncope, palpitations, abdominal pain, anorexia, nausea, vomiting, diarrhea  or change in bowel habits or change in bladder habits, change in stools or change in urine, dysuria, hematuria,  rash, arthralgias, visual complaints, headache, numbness, weakness or ataxia or problems with walking or coordination,  change in mood or  memory.        Current Meds  Medication Sig  . acetaminophen (TYLENOL) 500 MG tablet Take 500 mg by mouth every 6 (six) hours as needed.  .Marland Kitchenallopurinol (ZYLOPRIM) 100 MG tablet TAKE 1 TABLET BY MOUTH EVERY DAY  . amoxicillin (AMOXIL) 500 MG tablet Take 4 tablets by mouth 1 hour prior to dental procedure  . aspirin 325 MG EC tablet Take 325 mg by mouth every 4 (four) hours as needed for pain.  . benzonatate (TESSALON) 200 MG capsule Take 1 capsule (200 mg total) by mouth 3 (three) times daily as needed for cough.  . Biotin 10000 MCG TBDP 1 qd  . blood glucose meter kit and supplies KIT Dispense based on patient and insurance preference. Use up to two times daily as directed. (FOR ICD-9 250.00, 250.01).  .Marland KitchenbuPROPion (WELLBUTRIN XL) 150 MG 24 hr tablet Take  1 tablet (150 mg total) by mouth every morning.  . carvedilol (COREG) 6.25 MG tablet TAKE 1 TABLET BY MOUTH TWICE A DAY  . Cholecalciferol (VITAMIN D3) 2000 units TABS Take by mouth.  . famotidine (PEPCID) 20 MG tablet One after supper  . Garlic 13664MG CAPS Take 1 capsule by mouth daily.  .Marland Kitchenglucose blood (ONE TOUCH ULTRA TEST) test strip TEST FASTING BLOOD SUGAR AND AND 2 HOURS AFTER MEALS  . metFORMIN (GLUCOPHAGE) 500 MG tablet Take 500 mg by mouth daily.  .Glory RosebushDelica Lancets 340HMISC TEST FASTING BG AND 2 HOURS  AFTER MEALS  . valsartan-hydrochlorothiazide (DIOVAN-HCT) 160-25 MG tablet Take 1 tablet by mouth daily.          Past Medical History:  Diagnosis Date  . Diabetes mellitus without complication ([place])   . Hypertension   . Irregular heart beat        Objective:     pleasant amb bf nad   11/01/2019        195 04/15/2019        202   03/04/19 196 lb (88.9 kg)  02/15/19 196 lb (88.9 kg)  02/02/19 197 lb 12.8 oz (89.7 kg)     Vital signs reviewed  11/01/2019  - Note at rest 02 sats  100% on RA   HEENT : pt wearing mask not removed for exam due to covid -19 concerns.    NECK :  without JVD/Nodes/TM/ nl carotid upstrokes bilaterally   LUNGS: no acc muscle use,  Nl contour chest which is clear to A and P bilaterally without cough on insp or exp maneuvers   CV:  RRR  no s3 or murmur or increase in P2, and ankles slt puffy R > L s significant pitting    ABD:  soft and nontender with nl inspiratory excursion in the supine position. No bruits or organomegaly appreciated, bowel sounds nl  MS:  Nl gait/ ext warm without deformities, calf tenderness, cyanosis or clubbing No obvious joint restrictions   SKIN: warm and dry without lesions    NEURO:  alert, approp, nl sensorium with  no motor or cerebellar deficits apparent.                Assessment

## 2019-11-01 NOTE — Patient Instructions (Signed)
GERD (REFLUX)  is an extremely common cause of respiratory symptoms just like yours , many times with no obvious heartburn at all.    It can be treated with medication, but also with lifestyle changes including elevation of the head of your bed (ideally with 6 -8inch blocks under the headboard of your bed),  Smoking cessation, avoidance of late meals, excessive alcohol, and avoid fatty foods, chocolate, peppermint, colas, red wine, and acidic juices such as orange juice.  NO MINT OR MENTHOL PRODUCTS SO NO COUGH DROPS  USE SUGARLESS CANDY INSTEAD (Jolley ranchers or Stover's or Life Savers) or even ice chips will also do - the key is to swallow to prevent all throat clearing. NO OIL BASED VITAMINS - use powdered substitutes.  Avoid fish oil when coughing.   If you are satisfied with your treatment plan,  let your doctor know and he/she can either refill your medications or you can return here when your prescription runs out.     If in any way you are not 100% satisfied,  please tell us.  If 100% better, tell your friends!  Pulmonary follow up is as needed

## 2019-11-02 ENCOUNTER — Encounter: Payer: Self-pay | Admitting: Internal Medicine

## 2019-11-02 ENCOUNTER — Other Ambulatory Visit: Payer: Self-pay | Admitting: Cardiology

## 2019-11-02 NOTE — Assessment & Plan Note (Signed)
Onset ? p H1N1 but daily since fall 5436  - cyclical cough rx 0/67/7034 with tessalon suppression  - 04/04/2019 referred to [facility]/ [facility]> rec speech therapy/ no change in rx - 04/15/2019 changed to pepcid 20 mg bid as no worse since stopped ppi on her own - 06/14/2019 completed ST rx Schinke > marked improvement   She is clearly better at this point and just needs to be reminded about diet/ lifestyle choices to control gerd and cyclical coughing   F/u her is prn

## 2019-11-07 ENCOUNTER — Other Ambulatory Visit: Payer: Self-pay

## 2019-11-07 ENCOUNTER — Ambulatory Visit: Payer: Medicare PPO | Admitting: Nurse Practitioner

## 2019-11-07 ENCOUNTER — Encounter: Payer: Self-pay | Admitting: Nurse Practitioner

## 2019-11-07 VITALS — BP 138/74 | HR 69 | Temp 97.9°F | Ht 62.8 in | Wt 194.8 lb

## 2019-11-07 DIAGNOSIS — L249 Irritant contact dermatitis, unspecified cause: Secondary | ICD-10-CM | POA: Diagnosis not present

## 2019-11-07 MED ORDER — MOMETASONE FUROATE 0.1 % EX OINT: TOPICAL_OINTMENT | Freq: Every day | CUTANEOUS | 0 refills | Status: DC

## 2019-11-07 NOTE — Progress Notes (Signed)
This visit occurred during the SARS-CoV-2 public health emergency.  Safety protocols were in place, including screening questions prior to the visit, additional usage of staff PPE, and extensive cleaning of exam room while observing appropriate contact time as indicated for disinfecting solutions.  Subjective:     Patient ID: Jasmine Lopez , female    DOB: 1940-08-05 , 79 y.o.   MRN: [id number]   Chief Complaint  Patient presents with  . Insect Bite    patient stated she may have either an insect bite or some poison ivy and is growing in size now    HPI  Presents today for a chief complaint of a bug bite. She states that it started about 5-6 days ago. She has poison ivy on her walkway and thinks she may have come in contact when walking.  It is red and appears to be spreading and is erythematous and itching.  She also has areas of urticaria on the back of her knees bilaterally.She tried hydrocortisone, rubbing alcohol, and peroxide on the area over the last week.She has no other systemic complaints and no additional swelling in the affected leg.       Past Medical History:  Diagnosis Date  . Diabetes mellitus without complication ([place])   . Hypertension   . Irregular heart beat      Family History  Problem Relation Age of Onset  . Heart failure Mother   . COPD Mother   . Cancer Father   . Rectal cancer Maternal Grandmother   . Heart attack Maternal Grandfather      Current Outpatient Medications:  .  acetaminophen (TYLENOL) 500 MG tablet, Take 500 mg by mouth every 6 (six) hours as needed., Disp: , Rfl:  .  allopurinol (ZYLOPRIM) 100 MG tablet, TAKE 1 TABLET BY MOUTH EVERY DAY, Disp: 90 tablet, Rfl: 0 .  amoxicillin (AMOXIL) 500 MG tablet, Take 4 tablets by mouth 1 hour prior to dental procedure, Disp: 4 tablet, Rfl: 0 .  benzonatate (TESSALON) 200 MG capsule, Take 1 capsule (200 mg total) by mouth 3 (three) times daily as needed for cough., Disp: 45 capsule, Rfl: 11 .   Biotin 10000 MCG TBDP, 1 qd, Disp: , Rfl:  .  blood glucose meter kit and supplies KIT, Dispense based on patient and insurance preference. Use up to two times daily as directed. (FOR ICD-9 250.00, 250.01)., Disp: 1 each, Rfl: 0 .  buPROPion (WELLBUTRIN XL) 150 MG 24 hr tablet, Take 1 tablet (150 mg total) by mouth every morning., Disp: 30 tablet, Rfl: 2 .  carvedilol (COREG) 6.25 MG tablet, TAKE 1 TABLET BY MOUTH TWICE A DAY, Disp: 180 tablet, Rfl: 2 .  Cholecalciferol (VITAMIN D3) 2000 units TABS, Take by mouth., Disp: , Rfl:  .  famotidine (PEPCID) 20 MG tablet, One after supper, Disp: 60 tablet, Rfl: 11 .  Garlic 8280 MG CAPS, Take 1 capsule by mouth daily., Disp: , Rfl:  .  glucose blood (ONE TOUCH ULTRA TEST) test strip, TEST FASTING BLOOD SUGAR AND AND 2 HOURS AFTER MEALS, Disp: , Rfl:  .  metFORMIN (GLUCOPHAGE) 500 MG tablet, Take 500 mg by mouth daily., Disp: , Rfl:  .  OneTouch Delica Lancets 03K MISC, TEST FASTING BG AND 2 HOURS AFTER MEALS, Disp: 100 each, Rfl: 3 .  rosuvastatin (CRESTOR) 20 MG tablet, Take 1 tablet (20 mg total) by mouth daily., Disp: 30 tablet, Rfl: 5 .  valsartan-hydrochlorothiazide (DIOVAN-HCT) 160-25 MG tablet, Take 1 tablet by mouth  daily., Disp: 90 tablet, Rfl: 3 .  mometasone (ELOCON) 0.1 % ointment, Apply topically daily., Disp: 45 g, Rfl: 0   Allergies  Allergen Reactions  . Ace Inhibitors Cough  . Atorvastatin Other (See Comments)    Leg pain   . Benadryl [Diphenhydramine] Itching    Benadryl cream  . Peppermint Flavor [Flavoring Agent]     Causes horsenss     Review of Systems  Respiratory: Negative.   Cardiovascular: Negative.  Negative for chest pain, palpitations and leg swelling.  Skin: Positive for rash (red rash ).  Neurological: Negative for dizziness and headaches.  Psychiatric/Behavioral: Negative.      Today's Vitals   11/07/19 1101  BP: 138/74  Pulse: 69   There is no height or weight on file to calculate BMI.   Objective:   Physical Exam Constitutional:      Appearance: Normal appearance.  Cardiovascular:     Rate and Rhythm: Normal rate and regular rhythm.     Pulses: Normal pulses.     Heart sounds: Normal heart sounds. No murmur heard.   Skin:    General: Skin is warm and dry.     Capillary Refill: Capillary refill takes less than 2 seconds.     Findings: Erythema (right anterior ankle) and rash present.  Neurological:     General: No focal deficit present.     Mental Status: She is alert and oriented to person, place, and time.     Cranial Nerves: No cranial nerve deficit.  Psychiatric:        Mood and Affect: Mood normal.        Behavior: Behavior normal.        Thought Content: Thought content normal.        Judgment: Judgment normal.         Assessment And Plan:     1. Irritant contact dermatitis, unspecified trigger  Erythematous area to anterior ankle, non tender - mometasone (ELOCON) 0.1 % ointment; Apply topically daily.  Dispense: 45 g; Refill: 0     [name], FNP    THE PATIENT IS ENCOURAGED TO PRACTICE SOCIAL DISTANCING DUE TO THE COVID-19 PANDEMIC.

## 2019-11-07 NOTE — Progress Notes (Deleted)
Acute Office Visit  Subjective:    Patient ID: Jasmine Lopez, female    DOB: 1940/06/19, 79 y.o.   MRN: [id number]  Chief Complaint  Patient presents with  . Insect Bite    patient stated she may have either an insect bite or some poison ivy and is growing in size now    Presents today for a chief complaint of a bug bite. She states that it started about 5-6 days ago. She has poison ivy on her walkway and thinks she may have come in contact when walking.  It is red and appears to be spreading and is erythematous and itching.  She also has areas of urticaria on the back of her knees bilaterally.She tried hydrocortisone, rubbing alcohol, and peroxide on the area over the last week.She has no other systemic complaints and no additional swelling in the affected leg.      Past Medical History:  Diagnosis Date  . Diabetes mellitus without complication ([place])   . Hypertension   . Irregular heart beat     Past Surgical History:  Procedure Laterality Date  . BREAST EXCISIONAL BIOPSY Bilateral     Family History  Problem Relation Age of Onset  . Heart failure Mother   . COPD Mother   . Cancer Father   . Rectal cancer Maternal Grandmother   . Heart attack Maternal Grandfather     Social History   Socioeconomic History  . Marital status: Widowed    Spouse name: Not on file  . Number of children: Not on file  . Years of education: Not on file  . Highest education level: Not on file  Occupational History  . Occupation: retired  Tobacco Use  . Smoking status: Never Smoker  . Smokeless tobacco: Never Used  Vaping Use  . Vaping Use: Never used  Substance and Sexual Activity  . Alcohol use: Never  . Drug use: Never  . Sexual activity: Not Currently  Other Topics Concern  . Not on file  Social History Narrative   Lives home alone.  Widowed.  Retired.  MS counseling education. 4 cups coffee/ wk.     Social Determinants of Health   Financial Resource Strain: Low Risk    . Difficulty of Paying Living Expenses: Not hard at all  Food Insecurity: No Food Insecurity  . Worried About Charity fundraiser in the Last Year: Never true  . Ran Out of Food in the Last Year: Never true  Transportation Needs: No Transportation Needs  . Lack of Transportation (Medical): No  . Lack of Transportation (Non-Medical): No  Physical Activity: Sufficiently Active  . Days of Exercise per Week: 3 days  . Minutes of Exercise per Session: 60 min  Stress: No Stress Concern Present  . Feeling of Stress : Only a little  Social Connections:   . Frequency of Communication with Friends and Family:   . Frequency of Social Gatherings with Friends and Family:   . Attends Religious Services:   . Active Member of Clubs or Organizations:   . Attends Archivist Meetings:   Marland Kitchen Marital Status:   Intimate Partner Violence:   . Fear of Current or Ex-Partner:   . Emotionally Abused:   Marland Kitchen Physically Abused:   . Sexually Abused:     Outpatient Medications Prior to Visit  Medication Sig Dispense Refill  . acetaminophen (TYLENOL) 500 MG tablet Take 500 mg by mouth every 6 (six) hours as needed.    Marland Kitchen  allopurinol (ZYLOPRIM) 100 MG tablet TAKE 1 TABLET BY MOUTH EVERY DAY 90 tablet 0  . amoxicillin (AMOXIL) 500 MG tablet Take 4 tablets by mouth 1 hour prior to dental procedure 4 tablet 0  . benzonatate (TESSALON) 200 MG capsule Take 1 capsule (200 mg total) by mouth 3 (three) times daily as needed for cough. 45 capsule 11  . Biotin 10000 MCG TBDP 1 qd    . blood glucose meter kit and supplies KIT Dispense based on patient and insurance preference. Use up to two times daily as directed. (FOR ICD-9 250.00, 250.01). 1 each 0  . buPROPion (WELLBUTRIN XL) 150 MG 24 hr tablet Take 1 tablet (150 mg total) by mouth every morning. 30 tablet 2  . carvedilol (COREG) 6.25 MG tablet TAKE 1 TABLET BY MOUTH TWICE A DAY 180 tablet 2  . Cholecalciferol (VITAMIN D3) 2000 units TABS Take by mouth.    .  famotidine (PEPCID) 20 MG tablet One after supper 60 tablet 11  . Garlic 7680 MG CAPS Take 1 capsule by mouth daily.    Marland Kitchen glucose blood (ONE TOUCH ULTRA TEST) test strip TEST FASTING BLOOD SUGAR AND AND 2 HOURS AFTER MEALS    . metFORMIN (GLUCOPHAGE) 500 MG tablet Take 500 mg by mouth daily.    Glory Rosebush Delica Lancets 88P MISC TEST FASTING BG AND 2 HOURS AFTER MEALS 100 each 3  . rosuvastatin (CRESTOR) 20 MG tablet Take 1 tablet (20 mg total) by mouth daily. 30 tablet 5  . valsartan-hydrochlorothiazide (DIOVAN-HCT) 160-25 MG tablet Take 1 tablet by mouth daily. 90 tablet 3  . aspirin 325 MG EC tablet Take 325 mg by mouth every 4 (four) hours as needed for pain. (Patient not taking: Reported on 11/07/2019)     No facility-administered medications prior to visit.    Allergies  Allergen Reactions  . Ace Inhibitors Cough  . Atorvastatin Other (See Comments)    Leg pain   . Benadryl [Diphenhydramine] Itching    Benadryl cream  . Peppermint Flavor [Flavoring Agent]     Causes horsenss    Review of Systems  Constitutional: Negative.   HENT: Negative.   Eyes: Negative.   Respiratory: Negative.  Negative for shortness of breath.   Cardiovascular: Negative for palpitations and leg swelling.  Gastrointestinal: Negative.  Negative for diarrhea, nausea and vomiting.  Endocrine: Negative.   Genitourinary: Negative.   Skin: Positive for rash (right ankle area anterior.).       Red and papular rash on the dorsal surface of the ankle.  Allergic/Immunologic: Negative.   Neurological: Negative.   Psychiatric/Behavioral: Negative.        Objective:    Physical Exam Constitutional:      Appearance: Normal appearance.  Cardiovascular:     Rate and Rhythm: Normal rate and regular rhythm.     Pulses: Normal pulses.     Heart sounds: Normal heart sounds. No murmur heard.   Pulmonary:     Effort: Pulmonary effort is normal. No respiratory distress.     Breath sounds: Normal breath sounds.   Skin:    Findings: Erythema (anterior ankle ) and rash present.  Neurological:     General: No focal deficit present.     Mental Status: She is alert and oriented to person, place, and time.  Psychiatric:        Mood and Affect: Mood normal.        Behavior: Behavior normal.        Thought  Content: Thought content normal.        Judgment: Judgment normal.       Assessment & Plan:   1. Irritant contact dermatitis, unspecified trigger  Erythematous area to anterior ankle - mometasone (ELOCON) 0.1 % ointment; Apply topically daily.  Dispense: 45 g; Refill: 0   Meds ordered this encounter  Medications  . mometasone (ELOCON) 0.1 % ointment    Sig: Apply topically daily.    Dispense:  45 g    Refill:  0     [name], FNP

## 2019-11-07 NOTE — Progress Notes (Signed)
[facility] PREP Weekly Session   Patient Details  Name: Jasmine Lopez MRN: [id number] Date of Birth: December 09, 1940 Age: 79 y.o. PCP: Shelby Mattocks, PA-C  Vitals:   11/07/19 1616  BP: 140/69  Pulse: 69  Weight: 194 lb (88 kg)     [facility] Weekly seesion - 11/07/19 1600      Weekly Session   Topic Discussed --   portion size, orgranic produce, circuit workout   Minutes exercised this week 136 minutes    Classes attended to date 42          Fun things since last meeting: I did stretching Grateful for: I have learned more about my body's ability Nutrition celebration: I'm practicing eating less sugar, and salt  Barriers/struggles: I love to eat too much   [name] 11/07/2019, 4:18 PM

## 2019-11-08 ENCOUNTER — Other Ambulatory Visit: Payer: Self-pay | Admitting: Internal Medicine

## 2019-11-08 DIAGNOSIS — E2839 Other primary ovarian failure: Secondary | ICD-10-CM

## 2019-11-15 ENCOUNTER — Ambulatory Visit: Payer: Medicare PPO | Admitting: Neurology

## 2019-11-18 ENCOUNTER — Ambulatory Visit
Admission: RE | Admit: 2019-11-18 | Discharge: 2019-11-18 | Disposition: A | Discharge status: Home / Self Care | Payer: Medicare PPO | Source: Ambulatory Visit | Attending: Internal Medicine | Admitting: Internal Medicine

## 2019-11-18 ENCOUNTER — Other Ambulatory Visit: Payer: Self-pay

## 2019-11-18 DIAGNOSIS — G8929 Other chronic pain: Secondary | ICD-10-CM

## 2019-11-18 DIAGNOSIS — M4802 Spinal stenosis, cervical region: Secondary | ICD-10-CM | POA: Diagnosis not present

## 2019-11-21 NOTE — Progress Notes (Signed)
[facility] PREP Weekly Session   Patient Details  Name: Jasmine Lopez MRN: [id number] Date of Birth: 03-19-1941 Age: 79 y.o. PCP: Shelby Mattocks, PA-C  Vitals:   11/21/19 1549  BP: (!) 149/70  Pulse: 60  Weight: 194 lb 9.6 oz (88.3 kg)    Fun things since last meeting: walked with Grand at [employer] for: energy to work out and I feel 100% better Nutrition celebration: I am eating more greens, vegs, fruits and less meats (fatty) and processed Barriers/struggles: to stay on point or schedule. I need to be more self motivated everyday.   [name] 11/21/2019, 3:51 PM

## 2019-11-24 NOTE — Progress Notes (Signed)
[facility] Note  11/30/2019     CHIEF COMPLAINT Patient presents for Retina Follow Up   HISTORY OF PRESENT ILLNESS: Jasmine Lopez is a 79 y.o. female who presents to the clinic today for:   HPI    Retina Follow Up    Patient presents with  Diabetic Retinopathy.  In both eyes.  Duration of 12 months.  I, the attending physician,  performed the HPI with the patient and updated documentation appropriately.          Comments    1 year follow up DM2- Vision is stable OU, she is wearing her glasses more now.  Rx seems to be ok.  She has noticed the puffiness under her eyes are worse now.  She isn't sure if it is from medication or now.  She forgot to ask her PCP. LBS: last week, only checks once a week A1C: 6.4       Last edited by Bernarda Caffey, MD on 11/30/2019 10:19 AM. (History)     NIDDM for about 4-5 years. Patient states blood sugars fluctuate, last a1c was 6.8 in June 2020. Vision seems stable in both eyes. Occasional flash if moves eyes from side to side.   Referring physician: Glendale Chard, MD [address] [place],   [id number]  HISTORICAL INFORMATION:   Selected notes from the MEDICAL RECORD NUMBER Referred by Dr. Baird Cancer for concern of diabetic retinopathy LEE:  Ocular Hx- PMH-   CURRENT MEDICATIONS: No current outpatient medications on file. (Ophthalmic Drugs)   No current facility-administered medications for this visit. (Ophthalmic Drugs)   Current Outpatient Medications (Other)  Medication Sig  . acetaminophen (TYLENOL) 500 MG tablet Take 500 mg by mouth every 6 (six) hours as needed.  Marland Kitchen allopurinol (ZYLOPRIM) 100 MG tablet TAKE 1 TABLET BY MOUTH EVERY DAY  . amoxicillin (AMOXIL) 500 MG tablet Take 4 tablets by mouth 1 hour prior to dental procedure  . benzonatate (TESSALON) 200 MG capsule Take 1 capsule (200 mg total) by mouth 3 (three) times daily as needed for cough.  . Biotin 10000 MCG TBDP 1 qd  .  blood glucose meter kit and supplies KIT Dispense based on patient and insurance preference. Use up to two times daily as directed. (FOR ICD-9 250.00, 250.01).  Marland Kitchen buPROPion (WELLBUTRIN XL) 150 MG 24 hr tablet Take 1 tablet (150 mg total) by mouth every morning.  . carvedilol (COREG) 6.25 MG tablet TAKE 1 TABLET BY MOUTH TWICE A DAY  . Cholecalciferol (VITAMIN D3) 2000 units TABS Take by mouth.  . famotidine (PEPCID) 20 MG tablet One after supper  . Garlic 9371 MG CAPS Take 1 capsule by mouth daily.  Marland Kitchen glucose blood (ONE TOUCH ULTRA TEST) test strip TEST FASTING BLOOD SUGAR AND AND 2 HOURS AFTER MEALS  . metFORMIN (GLUCOPHAGE) 500 MG tablet Take 500 mg by mouth daily.  . mometasone (ELOCON) 0.1 % ointment Apply topically daily.  Glory Rosebush Delica Lancets 69C MISC TEST FASTING BG AND 2 HOURS AFTER MEALS  . rosuvastatin (CRESTOR) 20 MG tablet Take 1 tablet (20 mg total) by mouth daily.  . valsartan-hydrochlorothiazide (DIOVAN-HCT) 160-25 MG tablet Take 1 tablet by mouth daily.   No current facility-administered medications for this visit. (Other)      REVIEW OF SYSTEMS: ROS    Positive for: Neurological, Cardiovascular, Eyes, Psychiatric   Negative for: Constitutional, Gastrointestinal, Skin, Genitourinary, Musculoskeletal, HENT, Endocrine, Respiratory, Allergic/Imm, Heme/Lymph   Last edited  by Leonie Douglas, COA on 11/30/2019  9:37 AM. (History)       ALLERGIES Allergies  Allergen Reactions  . Ace Inhibitors Cough  . Atorvastatin Other (See Comments)    Leg pain   . Benadryl [Diphenhydramine] Itching    Benadryl cream  . Peppermint Flavor [Flavoring Agent]     Causes horsenss    PAST MEDICAL HISTORY Past Medical History:  Diagnosis Date  . Diabetes mellitus without complication ([place])   . Hypertension   . Irregular heart beat    Past Surgical History:  Procedure Laterality Date  . BREAST EXCISIONAL BIOPSY Bilateral     FAMILY HISTORY Family History  Problem Relation  Age of Onset  . Heart failure Mother   . COPD Mother   . Cancer Father   . Rectal cancer Maternal Grandmother   . Heart attack Maternal Grandfather     SOCIAL HISTORY Social History   Tobacco Use  . Smoking status: Never Smoker  . Smokeless tobacco: Never Used  Vaping Use  . Vaping Use: Never used  Substance Use Topics  . Alcohol use: Never  . Drug use: Never         OPHTHALMIC EXAM:  Base Eye Exam    Visual Acuity (Snellen - Linear)      Right Left   Dist [place] 20/25 +2 20/25 +1   Dist ph [place] NI NI   Correction: Glasses       Tonometry (Tonopen, 9:42 AM)      Right Left   Pressure 19 19       Pupils      Dark Light Shape React APD   Right 3 2 Round Brisk None   Left 3 2 Round Brisk None       Visual Fields      Left Right    Full Full       Extraocular Movement      Right Left    Full Full       Neuro/Psych    Oriented x3: Yes   Mood/Affect: Normal       Dilation    Both eyes: 1.0% Mydriacyl, 2.5% Phenylephrine @ 9:42 AM        Slit Lamp and Fundus Exam    Slit Lamp Exam      Right Left   Lids/Lashes Dermatochalasis - upper lid, Ptosis Dermatochalasis - upper lid   Conjunctiva/Sclera mild melanosis, white and quiet mild melanosis, white and quiet   Cornea mild arcus, 1+ PEE, mild tear film debris mild arcus, 1+ PEE   Anterior Chamber deep and clear deep and clear   Iris round and dilated, no NVI round and dilated, no NVI   Lens 2+ ns, 2-3+cortical 2+ ns, 2-3+cortical   Vitreous syneresis, PVD syneresis, PVD, weiss ring       Fundus Exam      Right Left   Disc pink and sharp, mild cupping, mild PPA pink and sharp   C/D Ratio 0.6 0.55   Macula flat, good foveal reflex, mild RPE mottling and clumping, no heme or edema flat, blunted foveal reflex, mild RPE mottling and clumping, no heme or edema   Vessels Vascular attenuation, mild AV crossing changes Vascular attenuation, mild AV crossing changes   Periphery attached, focal pigment clump at  07:00, no heme attached, no heme          IMAGING AND PROCEDURES  Imaging and Procedures for '@TODAY' @  OCT, Retina - OU - Both Eyes  Right Eye Quality was good. Central Foveal Thickness: 233. Progression has been stable. Findings include normal foveal contour, no IRF, no SRF.   Left Eye Quality was good. Central Foveal Thickness: 231. Progression has been stable. Findings include normal foveal contour, no IRF, no SRF.   Notes *Images captured and stored on drive  Diagnosis / Impression:  OU: NFP, no IRF/SRF No DME OU  Clinical management:  See below  Abbreviations: NFP - Normal foveal profile. CME - cystoid macular edema. PED - pigment epithelial detachment. IRF - intraretinal fluid. SRF - subretinal fluid. EZ - ellipsoid zone. ERM - epiretinal membrane. ORA - outer retinal atrophy. ORT - outer retinal tubulation. SRHM - subretinal hyper-reflective material                 ASSESSMENT/PLAN:    ICD-10-CM   1. Diabetes mellitus type 2 without retinopathy ([place])  E11.9   2. Retinal edema  H35.81 OCT, Retina - OU - Both Eyes  3. Essential hypertension  I10   4. Hypertensive retinopathy of both eyes  H35.033   5. Posterior vitreous detachment of both eyes  H43.813   6. Combined forms of age-related cataract of both eyes  H25.813   7. Dermatochalasis of both upper eyelids  H02.831    H02.834   8. Ptosis of eyelid, right  H02.401     1,2. Diabetes mellitus, type 2 without retinopathy OU -- stable  - last A1c 6.9% (9.10.20)  - The incidence, risk factors for progression, natural history and treatment options for diabetic retinopathy  were discussed with patient.    - The need for close monitoring of blood glucose, blood pressure, and serum lipids, avoiding cigarette or any type of tobacco, and the need for long term follow up was also discussed with patient.  - f/u in 1 year, sooner prn  3,4. Hypertensive retinopathy OU  - discussed importance of tight BP  control  - monitor  5. PVD / vitreous syneresis OU  - Discussed findings and prognosis  - No RT or RD on 360 peripheral exam  - Reviewed s/s of RT/RD  - Strict return precautions for any such RT/RD signs/symptoms  6. Mixed cataracts OU  - The symptoms of cataract, surgical options, and treatments and risks were discussed with patient.  - discussed diagnosis and progression  - Pt under the expert management of [place]  7,8. Dermatocholasis, bilateral upper lids; Ptosis OD  - Will refer to Sarajane Marek, MD ([facility] for eval)   Ophthalmic Meds Ordered this visit:  No orders of the defined types were placed in this encounter.      Return in about 1 year (around 11/29/2020) for 12 mo. DFE/OCT.  There are no Patient Instructions on file for this visit.   Explained the diagnoses, plan, and follow up with the patient and they expressed understanding.  Patient expressed understanding of the importance of proper follow up care.   This document serves as a record of services personally performed by Gardiner Sleeper, MD, PhD. It was created on their behalf by Roselee Nova, COMT. The creation of this record is the provider's dictation and/or activities during the visit.  Electronically signed by: Roselee Nova, COMT 12/01/19 1:45 PM  Gardiner Sleeper, M.D., Ph.D. Diseases & Surgery of the Retina and Vitreous [facility]  I have reviewed the above documentation for accuracy and completeness, and I agree with the above. Gardiner Sleeper, M.D., Ph.D. 12/01/19  1:45 PM   Abbreviations: M myopia (nearsighted); A astigmatism; H hyperopia (farsighted); P presbyopia; Mrx spectacle prescription;  CTL contact lenses; OD right eye; OS left eye; OU both eyes  XT exotropia; ET esotropia; PEK punctate epithelial keratitis; PEE punctate epithelial erosions; DES dry eye syndrome; MGD meibomian gland dysfunction; ATs artificial tears; PFAT's preservative free artificial  tears; [place] nuclear sclerotic cataract; PSC posterior subcapsular cataract; ERM epi-retinal membrane; PVD posterior vitreous detachment; RD retinal detachment; DM diabetes mellitus; DR diabetic retinopathy; NPDR non-proliferative diabetic retinopathy; PDR proliferative diabetic retinopathy; CSME clinically significant macular edema; DME diabetic macular edema; dbh dot blot hemorrhages; CWS cotton wool spot; POAG primary open angle glaucoma; C/D cup-to-disc ratio; HVF humphrey visual field; GVF goldmann visual field; OCT optical coherence tomography; IOP intraocular pressure; BRVO Branch retinal vein occlusion; CRVO central retinal vein occlusion; CRAO central retinal artery occlusion; BRAO branch retinal artery occlusion; RT retinal tear; SB scleral buckle; PPV pars plana vitrectomy; VH Vitreous hemorrhage; PRP panretinal laser photocoagulation; IVK intravitreal kenalog; VMT vitreomacular traction; MH Macular hole;  NVD neovascularization of the disc; NVE neovascularization elsewhere; AREDS age related eye disease study; ARMD age related macular degeneration; POAG primary open angle glaucoma; EBMD epithelial/anterior basement membrane dystrophy; ACIOL anterior chamber intraocular lens; IOL intraocular lens; PCIOL posterior chamber intraocular lens; Phaco/IOL phacoemulsification with intraocular lens placement; IXL photorefractive keratectomy; LASIK laser assisted in situ keratomileusis; HTN hypertension; DM diabetes mellitus; COPD chronic obstructive pulmonary disease

## 2019-11-28 NOTE — Progress Notes (Signed)
[facility] PREP Weekly Session   Patient Details  Name: Jasmine Lopez MRN: [id number] Date of Birth: 02-Apr-1941 Age: 79 y.o. PCP: Shelby Mattocks, PA-C  Vitals:   11/28/19 1611  BP: (!) 150/60  Pulse: (!) 58  Weight: 192 lb 6.4 oz (87.3 kg)     [facility] Weekly seesion - 11/28/19 1600      Weekly Session   Topic Discussed Hitting roadblocks    Minutes exercised this week 240 minutes    Classes attended to date 14          Fun things since last meeting: walked at mall 1.5 hr Grateful for: lowered my weight some Nutrition celebration: Planning meals better, including veggies and salads Barriers/struggles: not drinking enough water daily ( working on it)   [employer] 11/28/2019, 4:12 PM

## 2019-11-30 ENCOUNTER — Other Ambulatory Visit: Payer: Self-pay

## 2019-11-30 ENCOUNTER — Encounter (INDEPENDENT_AMBULATORY_CARE_PROVIDER_SITE_OTHER): Payer: Self-pay | Admitting: Ophthalmology

## 2019-11-30 ENCOUNTER — Ambulatory Visit (INDEPENDENT_AMBULATORY_CARE_PROVIDER_SITE_OTHER): Payer: Medicare PPO | Admitting: Ophthalmology

## 2019-11-30 DIAGNOSIS — H35033 Hypertensive retinopathy, bilateral: Secondary | ICD-10-CM | POA: Diagnosis not present

## 2019-11-30 DIAGNOSIS — H3581 Retinal edema: Secondary | ICD-10-CM | POA: Diagnosis not present

## 2019-11-30 DIAGNOSIS — H02834 Dermatochalasis of left upper eyelid: Secondary | ICD-10-CM

## 2019-11-30 DIAGNOSIS — H02831 Dermatochalasis of right upper eyelid: Secondary | ICD-10-CM

## 2019-11-30 DIAGNOSIS — H43813 Vitreous degeneration, bilateral: Secondary | ICD-10-CM

## 2019-11-30 DIAGNOSIS — I10 Essential (primary) hypertension: Secondary | ICD-10-CM | POA: Diagnosis not present

## 2019-11-30 DIAGNOSIS — E119 Type 2 diabetes mellitus without complications: Secondary | ICD-10-CM | POA: Diagnosis not present

## 2019-11-30 DIAGNOSIS — H02401 Unspecified ptosis of right eyelid: Secondary | ICD-10-CM

## 2019-11-30 DIAGNOSIS — H25813 Combined forms of age-related cataract, bilateral: Secondary | ICD-10-CM

## 2019-12-03 ENCOUNTER — Other Ambulatory Visit: Payer: Self-pay | Admitting: Nurse Practitioner

## 2019-12-06 ENCOUNTER — Other Ambulatory Visit: Payer: Self-pay | Admitting: Nurse Practitioner

## 2019-12-06 DIAGNOSIS — F32A Depression, unspecified: Secondary | ICD-10-CM

## 2019-12-07 ENCOUNTER — Telehealth: Payer: Self-pay | Admitting: Cardiovascular Disease

## 2019-12-07 NOTE — Telephone Encounter (Signed)
Returned the call to the patient. She stated that for the last couple of weeks her blood pressure has gradually been creeping up. She stated that she takes her blood pressure daily but does not keep a record of them. They have been running from the 712-197'J systolic. While on the phone her blood pressure was 159/86 and heart rate was 59.  She stated that her Amlodipine 10 mg was discontinued. She is not sure who or why it was done. Did not see in epic any mention of this except that it was off her list in early June.   She is asymptomatic but concerned that it is still getting higher. She has been advised to start writing down her blood pressures and heart rates.   She is currently taking: Carvedilol 6.25 bid Diovan 160-25 mg once daily

## 2019-12-07 NOTE — Telephone Encounter (Signed)
Pt c/o medication issue:  1. Name of Medication: amLODipine (NORVASC) 10 MG tablet   2. How are you currently taking this medication (dosage and times per day)? Pt has not taken this medication since June when it was d/c  3. Are you having a reaction (difficulty breathing--STAT)? no  4. What is your medication issue? Pt has noticed her BP has been starting to go up gradually since she has stopped this medicine. She wanted to know if she needed to go back on this medication   Pt c/o BP issue: STAT if pt c/o blurred vision, one-sided weakness or slurred speech  1. What are your last 5 BP readings?  12/02/19: 148/83 (morning) 12/02/19: 159/80 HR 60 ([facility] at local [facility]) 12/02/19: 148/78 (after exercising) 12/02/19: 145/78 (at bedtime)  2. Are you having any other symptoms (ex. Dizziness, headache, blurred vision, passed out)? no  3. What is your BP issue? Pt wanted to know if her BP has gone back up as a result of stopping the medication or if it is an external source( weather, stress, etc) that is raising her BP   Patient states her BP was in the range of 135/70 while on the medicine

## 2019-12-07 NOTE — Progress Notes (Signed)
[facility] Report   Patient Details  Name: Jasmine Lopez MRN: [id number] Date of Birth: August 23, 1940 Age: 79 y.o. PCP: Shelby Mattocks, PA-C  Vitals:   12/07/19 1607  BP: (!) 152/72  Pulse: 66  Weight: 194 lb 6.4 oz (88.2 kg)      [facility] Eval - 12/07/19 1600      Measurement   Waist Circumference 43 inches    Hip Circumference 46 inches    Body fat 46.9 percent      Mobility and Daily Activities   I find it easy to walk up or down two or more flights of stairs. 3    I have no trouble taking out the trash. 4    I do housework such as vacuuming and dusting on my own without difficulty. 4    I can easily lift a gallon of milk (8lbs). 1    I can easily walk a mile. 3    I have no trouble reaching into high cupboards or reaching down to pick up something from the floor. 4    I do not have trouble doing out-door work such as Armed forces logistics/support/administrative officer, raking leaves, or gardening. 3      Mobility and Daily Activities   I feel younger than my age. 4    I feel independent. 3    I feel energetic. 3    I live an active life.  3    I feel strong. 3    I feel healthy. 3    I feel active as other people my age. 2      How fit and strong are you.   Fit and Strong Total Score 43          Past Medical History:  Diagnosis Date  . Diabetes mellitus without complication ([place])   . Hypertension   . Irregular heart beat    Past Surgical History:  Procedure Laterality Date  . BREAST EXCISIONAL BIOPSY Bilateral    Social History   Tobacco Use  Smoking Status Never Smoker  Smokeless Tobacco Never Used  Encouraged to keep exercising and f/u on BP meds Limit sodium per day to <1500 Limit added sugars to <24      Pam M Airlie Blumenberg 12/07/2019, 4:09 PM

## 2019-12-20 LAB — HM DIABETES EYE EXAM

## 2019-12-22 NOTE — Telephone Encounter (Signed)
Left a message for the patient to call back.  

## 2019-12-22 NOTE — Telephone Encounter (Signed)
Please have her restart amlodipine 10 mg.

## 2019-12-23 NOTE — Telephone Encounter (Signed)
Left a message for the patient to call back.  

## 2019-12-30 MED ORDER — AMLODIPINE BESYLATE 10 MG PO TABS
10.0000 mg | ORAL_TABLET | Freq: Every day | ORAL | 11 refills | Status: DC
Start: 2019-12-30 — End: 2020-07-24

## 2019-12-30 NOTE — Telephone Encounter (Signed)
Pt aware and script sent via epic ./cy

## 2019-12-30 NOTE — Telephone Encounter (Signed)
Follow up:     Patient retuning call from 12/23/19. Please call patient back.

## 2020-01-26 ENCOUNTER — Other Ambulatory Visit: Payer: Medicare PPO

## 2020-01-30 ENCOUNTER — Other Ambulatory Visit: Payer: Self-pay | Admitting: Cardiovascular Disease

## 2020-02-03 ENCOUNTER — Other Ambulatory Visit: Payer: Self-pay | Admitting: Nurse Practitioner

## 2020-02-03 DIAGNOSIS — Z853 Personal history of malignant neoplasm of breast: Secondary | ICD-10-CM

## 2020-02-15 ENCOUNTER — Ambulatory Visit: Payer: Medicare PPO | Admitting: Nurse Practitioner

## 2020-02-16 ENCOUNTER — Encounter: Payer: Self-pay | Admitting: Nurse Practitioner

## 2020-02-16 ENCOUNTER — Other Ambulatory Visit: Payer: Self-pay

## 2020-02-16 ENCOUNTER — Ambulatory Visit (INDEPENDENT_AMBULATORY_CARE_PROVIDER_SITE_OTHER): Payer: Medicare PPO | Admitting: Nurse Practitioner

## 2020-02-16 VITALS — BP 120/76 | HR 94 | Temp 97.7°F | Ht 62.8 in | Wt 194.8 lb

## 2020-02-16 DIAGNOSIS — E785 Hyperlipidemia, unspecified: Secondary | ICD-10-CM

## 2020-02-16 DIAGNOSIS — Z86000 Personal history of in-situ neoplasm of breast: Secondary | ICD-10-CM | POA: Diagnosis not present

## 2020-02-16 DIAGNOSIS — F419 Anxiety disorder, unspecified: Secondary | ICD-10-CM | POA: Diagnosis not present

## 2020-02-16 DIAGNOSIS — I83893 Varicose veins of bilateral lower extremities with other complications: Secondary | ICD-10-CM | POA: Diagnosis not present

## 2020-02-16 DIAGNOSIS — I10 Essential (primary) hypertension: Secondary | ICD-10-CM | POA: Diagnosis not present

## 2020-02-16 DIAGNOSIS — R202 Paresthesia of skin: Secondary | ICD-10-CM | POA: Diagnosis not present

## 2020-02-16 DIAGNOSIS — E1169 Type 2 diabetes mellitus with other specified complication: Secondary | ICD-10-CM | POA: Diagnosis not present

## 2020-02-16 DIAGNOSIS — Z1159 Encounter for screening for other viral diseases: Secondary | ICD-10-CM

## 2020-02-16 NOTE — Progress Notes (Signed)
I,Jasmine Lopez as a Education administrator for [employer], FNP.,have documented all relevant documentation on the behalf of Jasmine Brine, FNP,as directed by  Jasmine Brine, FNP while in the presence of Jasmine Lopez, [place]. This visit occurred during the SARS-CoV-2 public health emergency.  Safety protocols were in place, including screening questions prior to the visit, additional usage of staff PPE, and extensive cleaning of exam room while observing appropriate contact time as indicated for disinfecting solutions.  Subjective:     Patient ID: Jasmine Lopez , female    DOB: May 06, 1941 , 79 y.o.   MRN: [id number]   Chief Complaint  Patient presents with  . Diabetes  . Hypertension    HPI  Here for 4 month follow up.  Overall she is doing well.   She is taking her Wellbutrin in the morning because it kept her awake.   She had been with a program through the cardiologist and reports her HgbA1c was 6 back in the summer.   She is having tingling in her toes.  She has also seen Neurology for her memory - she does report having issues with forgetting where she put her keys.  She feels she needs to talk with someone.  She does not feel like harming herself.  She has been previously on her own after her husband and mother passed so close together.   She had left breast lumpectomy. She was on oral chemo.  She is at year 5 of being cancer free.     Past Medical History:  Diagnosis Date  . Diabetes mellitus without complication ([place])   . Hypertension   . Irregular heart beat      Family History  Problem Relation Age of Onset  . Heart failure Mother   . COPD Mother   . Cancer Father   . Rectal cancer Maternal Grandmother   . Heart attack Maternal Grandfather      Current Outpatient Medications:  .  acetaminophen (TYLENOL) 500 MG tablet, Take 500 mg by mouth every 6 (six) hours as needed., Disp: , Rfl:  .  allopurinol (ZYLOPRIM) 100 MG tablet, TAKE 1 TABLET BY MOUTH EVERY DAY, Disp:  90 tablet, Rfl: 0 .  amLODipine (NORVASC) 10 MG tablet, Take 1 tablet (10 mg total) by mouth daily., Disp: 30 tablet, Rfl: 11 .  Biotin 10000 MCG TBDP, 1 qd, Disp: , Rfl:  .  blood glucose meter kit and supplies KIT, Dispense based on patient and insurance preference. Use up to two times daily as directed. (FOR ICD-9 250.00, 250.01)., Disp: 1 each, Rfl: 0 .  buPROPion (WELLBUTRIN XL) 150 MG 24 hr tablet, TAKE 1 TABLET(150 MG) BY MOUTH EVERY MORNING, Disp: 30 tablet, Rfl: 2 .  carvedilol (COREG) 6.25 MG tablet, TAKE 1 TABLET BY MOUTH TWICE A DAY, Disp: 180 tablet, Rfl: 2 .  Cholecalciferol (VITAMIN D3) 2000 units TABS, Take by mouth., Disp: , Rfl:  .  Garlic 7867 MG CAPS, Take 1 capsule by mouth daily., Disp: , Rfl:  .  glucose blood (ONE TOUCH ULTRA TEST) test strip, TEST FASTING BLOOD SUGAR AND AND 2 HOURS AFTER MEALS, Disp: , Rfl:  .  metFORMIN (GLUCOPHAGE) 500 MG tablet, Take 500 mg by mouth daily., Disp: , Rfl:  .  mometasone (ELOCON) 0.1 % ointment, Apply topically daily., Disp: 45 g, Rfl: 0 .  OneTouch Delica Lancets 54G MISC, TEST FASTING BG AND 2 HOURS AFTER MEALS, Disp: 100 each, Rfl: 3 .  rosuvastatin (CRESTOR) 20 MG tablet,  TAKE 1 TABLET(20 MG) BY MOUTH DAILY, Disp: 30 tablet, Rfl: 5 .  valsartan-hydrochlorothiazide (DIOVAN-HCT) 160-25 MG tablet, Take 1 tablet by mouth daily., Disp: 90 tablet, Rfl: 3 .  amoxicillin (AMOXIL) 500 MG tablet, Take 4 tablets by mouth 1 hour prior to dental procedure (Patient not taking: Reported on 02/16/2020), Disp: 4 tablet, Rfl: 0 .  benzonatate (TESSALON) 200 MG capsule, Take 1 capsule (200 mg total) by mouth 3 (three) times daily as needed for cough. (Patient not taking: Reported on 02/16/2020), Disp: 45 capsule, Rfl: 11 .  famotidine (PEPCID) 20 MG tablet, One after supper (Patient not taking: Reported on 02/16/2020), Disp: 60 tablet, Rfl: 11   Allergies  Allergen Reactions  . Ace Inhibitors Cough  . Atorvastatin Other (See Comments)    Leg pain   .  Benadryl [Diphenhydramine] Itching    Benadryl cream  . Peppermint Flavor [Flavoring Agent]     Causes horsenss     Review of Systems  Constitutional: Negative.   Respiratory: Negative.   Cardiovascular: Negative.  Negative for chest pain, palpitations and leg swelling.  Neurological: Negative for dizziness, numbness and headaches.       She has been having tingling. She was referred to vein and vascular  Psychiatric/Behavioral: Negative.      Today's Vitals   02/16/20 1000  BP: 120/76  Pulse: 94  Temp: 97.7 F (36.5 C)  TempSrc: Oral  Weight: 194 lb 12.8 oz (88.4 kg)  Height: 5' 2.8" (1.595 m)  PainSc: 0-No pain   Body mass index is 34.73 kg/m.   Objective:  Physical Exam Vitals reviewed.  Constitutional:      General: She is not in acute distress.    Appearance: Normal appearance. She is well-developed. She is obese.  Cardiovascular:     Rate and Rhythm: Normal rate and regular rhythm.     Pulses: Normal pulses.     Heart sounds: Normal heart sounds. No murmur heard.   Pulmonary:     Effort: Pulmonary effort is normal. No respiratory distress.     Breath sounds: Normal breath sounds. No wheezing.  Chest:     Chest wall: No tenderness.  Musculoskeletal:        General: Normal range of motion.  Skin:    General: Skin is warm and dry.     Capillary Refill: Capillary refill takes less than 2 seconds.  Neurological:     General: No focal deficit present.     Mental Status: She is alert and oriented to person, place, and time.  Psychiatric:        Mood and Affect: Mood normal.        Behavior: Behavior normal.        Thought Content: Thought content normal.        Judgment: Judgment normal.         Assessment And Plan:     1. Type 2 diabetes mellitus with hyperlipidemia (HCC)  Chronic, controlled  Continue with current medications  Encouraged to limit intake of sugary foods and drinks  Encouraged to increase physical activity to 150 minutes per week  as tolerated - Hemoglobin A1c - CMP14+EGFR  2. Essential hypertension . B/P is well controlled.  . CMP ordered to check renal function.  . The importance of regular exercise and dietary modification was stressed to the patient.   - CMP14+EGFR  3. Anxiety  She feels she needs to speak to someone with the loss of her husband and mother who passed  away close together  Will refer to counseling - Ambulatory referral to Psychology  4. Varicose veins of bilateral lower extremities with other complications  She has various varicose veins that are causing her discomfort  Will refer to vascular for further evaluation - Ambulatory referral to Vascular Surgery  5. Tingling of both feet  She does have a history of type 2 diabetes so this could be related to a form of neuropathy, will refer to vascular for further evaluation - Ambulatory referral to Vascular Surgery  6. History of ductal carcinoma in situ (DCIS) of breast  7. Encounter for hepatitis C screening test for low risk patient  Will check Hepatitis C screening due to recent recommendations to screen all adults 18 years and older - Hepatitis C antibody     Patient was given opportunity to ask questions. Patient verbalized understanding of the plan and was able to repeat key elements of the plan. All questions were answered to their satisfaction.  Jasmine Brine, FNP   I, Jasmine Brine, FNP, have reviewed all documentation for this visit. The documentation on 02/16/20 for the exam, diagnosis, procedures, and orders are all accurate and complete.  THE PATIENT IS ENCOURAGED TO PRACTICE SOCIAL DISTANCING DUE TO THE COVID-19 PANDEMIC.

## 2020-02-17 LAB — HEMOGLOBIN A1C
Est. average glucose Bld gHb Est-mCnc: 197 mg/dL
Hgb A1c MFr Bld: 8.5 % — ABNORMAL HIGH (ref 4.8–5.6)

## 2020-02-17 LAB — CMP14+EGFR
ALT: 23 IU/L (ref 0–32)
AST: 19 IU/L (ref 0–40)
Albumin/Globulin Ratio: 1.8 (ref 1.2–2.2)
Albumin: 4.2 g/dL (ref 3.7–4.7)
Alkaline Phosphatase: 66 IU/L (ref 44–121)
BUN/Creatinine Ratio: 24 (ref 12–28)
BUN: 18 mg/dL (ref 8–27)
Bilirubin Total: 0.3 mg/dL (ref 0.0–1.2)
CO2: 24 mmol/L (ref 20–29)
Calcium: 9.8 mg/dL (ref 8.7–10.3)
Chloride: 101 mmol/L (ref 96–106)
Creatinine, Ser: 0.74 mg/dL (ref 0.57–1.00)
GFR calc Af Amer: 90 mL/min/{1.73_m2} (ref >59)
GFR calc non Af Amer: 78 mL/min/{1.73_m2} (ref >59)
Globulin, Total: 2.4 g/dL (ref 1.5–4.5)
Glucose: 182 mg/dL — ABNORMAL HIGH (ref 65–99)
Potassium: 4.4 mmol/L (ref 3.5–5.2)
Sodium: 140 mmol/L (ref 134–144)
Total Protein: 6.6 g/dL (ref 6.0–8.5)

## 2020-02-17 LAB — HEPATITIS C ANTIBODY: Hep C Virus Ab: <0.1 s/co ratio (ref 0.0–0.9)

## 2020-03-01 DIAGNOSIS — M9902 Segmental and somatic dysfunction of thoracic region: Secondary | ICD-10-CM | POA: Diagnosis not present

## 2020-03-01 DIAGNOSIS — M9903 Segmental and somatic dysfunction of lumbar region: Secondary | ICD-10-CM | POA: Diagnosis not present

## 2020-03-01 DIAGNOSIS — M5032 Other cervical disc degeneration, mid-cervical region, unspecified level: Secondary | ICD-10-CM | POA: Diagnosis not present

## 2020-03-01 DIAGNOSIS — M47814 Spondylosis without myelopathy or radiculopathy, thoracic region: Secondary | ICD-10-CM | POA: Diagnosis not present

## 2020-03-01 DIAGNOSIS — M5136 Other intervertebral disc degeneration, lumbar region: Secondary | ICD-10-CM | POA: Diagnosis not present

## 2020-03-01 DIAGNOSIS — M9901 Segmental and somatic dysfunction of cervical region: Secondary | ICD-10-CM | POA: Diagnosis not present

## 2020-03-03 ENCOUNTER — Encounter: Payer: Self-pay | Admitting: Nurse Practitioner

## 2020-03-07 DIAGNOSIS — R600 Localized edema: Secondary | ICD-10-CM | POA: Diagnosis not present

## 2020-03-07 DIAGNOSIS — I8311 Varicose veins of right lower extremity with inflammation: Secondary | ICD-10-CM | POA: Diagnosis not present

## 2020-03-07 DIAGNOSIS — I8312 Varicose veins of left lower extremity with inflammation: Secondary | ICD-10-CM | POA: Diagnosis not present

## 2020-03-12 ENCOUNTER — Ambulatory Visit
Admission: RE | Admit: 2020-03-12 | Discharge: 2020-03-12 | Disposition: A | Discharge status: Home / Self Care | Payer: Medicare PPO | Source: Ambulatory Visit | Attending: Nurse Practitioner | Admitting: Nurse Practitioner

## 2020-03-12 ENCOUNTER — Other Ambulatory Visit: Payer: Self-pay

## 2020-03-12 DIAGNOSIS — R928 Other abnormal and inconclusive findings on diagnostic imaging of breast: Secondary | ICD-10-CM | POA: Diagnosis not present

## 2020-03-12 DIAGNOSIS — Z853 Personal history of malignant neoplasm of breast: Secondary | ICD-10-CM

## 2020-03-12 HISTORY — DX: Malignant neoplasm of unspecified site of unspecified female breast: C50.919

## 2020-03-12 HISTORY — DX: Malignant (primary) neoplasm, unspecified: C80.1

## 2020-03-13 ENCOUNTER — Other Ambulatory Visit: Payer: Self-pay

## 2020-03-13 MED ORDER — ALLOPURINOL 100 MG PO TABS
100.0000 mg | ORAL_TABLET | Freq: Every day | ORAL | 0 refills | Status: DC
Start: 2020-03-13 — End: 2020-06-25

## 2020-03-29 DIAGNOSIS — M9901 Segmental and somatic dysfunction of cervical region: Secondary | ICD-10-CM | POA: Diagnosis not present

## 2020-03-29 DIAGNOSIS — M47814 Spondylosis without myelopathy or radiculopathy, thoracic region: Secondary | ICD-10-CM | POA: Diagnosis not present

## 2020-03-29 DIAGNOSIS — M5136 Other intervertebral disc degeneration, lumbar region: Secondary | ICD-10-CM | POA: Diagnosis not present

## 2020-03-29 DIAGNOSIS — M9902 Segmental and somatic dysfunction of thoracic region: Secondary | ICD-10-CM | POA: Diagnosis not present

## 2020-03-29 DIAGNOSIS — M5032 Other cervical disc degeneration, mid-cervical region, unspecified level: Secondary | ICD-10-CM | POA: Diagnosis not present

## 2020-03-29 DIAGNOSIS — M9903 Segmental and somatic dysfunction of lumbar region: Secondary | ICD-10-CM | POA: Diagnosis not present

## 2020-04-12 DIAGNOSIS — M9901 Segmental and somatic dysfunction of cervical region: Secondary | ICD-10-CM | POA: Diagnosis not present

## 2020-04-12 DIAGNOSIS — M9903 Segmental and somatic dysfunction of lumbar region: Secondary | ICD-10-CM | POA: Diagnosis not present

## 2020-04-12 DIAGNOSIS — M5032 Other cervical disc degeneration, mid-cervical region, unspecified level: Secondary | ICD-10-CM | POA: Diagnosis not present

## 2020-04-12 DIAGNOSIS — M47814 Spondylosis without myelopathy or radiculopathy, thoracic region: Secondary | ICD-10-CM | POA: Diagnosis not present

## 2020-04-12 DIAGNOSIS — M5136 Other intervertebral disc degeneration, lumbar region: Secondary | ICD-10-CM | POA: Diagnosis not present

## 2020-04-12 DIAGNOSIS — M9902 Segmental and somatic dysfunction of thoracic region: Secondary | ICD-10-CM | POA: Diagnosis not present

## 2020-05-02 DIAGNOSIS — I8312 Varicose veins of left lower extremity with inflammation: Secondary | ICD-10-CM | POA: Diagnosis not present

## 2020-05-02 DIAGNOSIS — I8311 Varicose veins of right lower extremity with inflammation: Secondary | ICD-10-CM | POA: Diagnosis not present

## 2020-05-10 ENCOUNTER — Other Ambulatory Visit: Payer: Medicare PPO

## 2020-05-16 ENCOUNTER — Encounter: Payer: Self-pay | Admitting: Nurse Practitioner

## 2020-05-18 ENCOUNTER — Ambulatory Visit (INDEPENDENT_AMBULATORY_CARE_PROVIDER_SITE_OTHER): Payer: Medicare PPO | Admitting: Psychologist

## 2020-05-18 DIAGNOSIS — F320 Major depressive disorder, single episode, mild: Secondary | ICD-10-CM | POA: Diagnosis not present

## 2020-05-18 DIAGNOSIS — I8311 Varicose veins of right lower extremity with inflammation: Secondary | ICD-10-CM | POA: Diagnosis not present

## 2020-05-18 DIAGNOSIS — I8312 Varicose veins of left lower extremity with inflammation: Secondary | ICD-10-CM | POA: Diagnosis not present

## 2020-05-21 ENCOUNTER — Encounter: Payer: Self-pay | Admitting: Nurse Practitioner

## 2020-06-05 ENCOUNTER — Ambulatory Visit (INDEPENDENT_AMBULATORY_CARE_PROVIDER_SITE_OTHER): Payer: Medicare PPO | Admitting: Psychologist

## 2020-06-05 DIAGNOSIS — F320 Major depressive disorder, single episode, mild: Secondary | ICD-10-CM

## 2020-06-06 ENCOUNTER — Other Ambulatory Visit: Payer: Medicare PPO

## 2020-06-06 ENCOUNTER — Other Ambulatory Visit: Payer: Self-pay

## 2020-06-06 IMAGING — MG DIGITAL DIAGNOSTIC UNILATERAL LEFT MAMMOGRAM WITH TOMO AND CAD
8 series · 8 of 24 positions shown · non-contrast
Comparison: Previous exam(s).

CLINICAL DATA: 78-year-old female with focal pain in the INNER LEFT
breast and LEFT axilla. History of prior LEFT breast biopsy.

EXAM:
DIGITAL DIAGNOSTIC LEFT MAMMOGRAM WITH CAD AND TOMO
ULTRASOUND LEFT BREAST

[L CC synth-2D (1 of 2)]
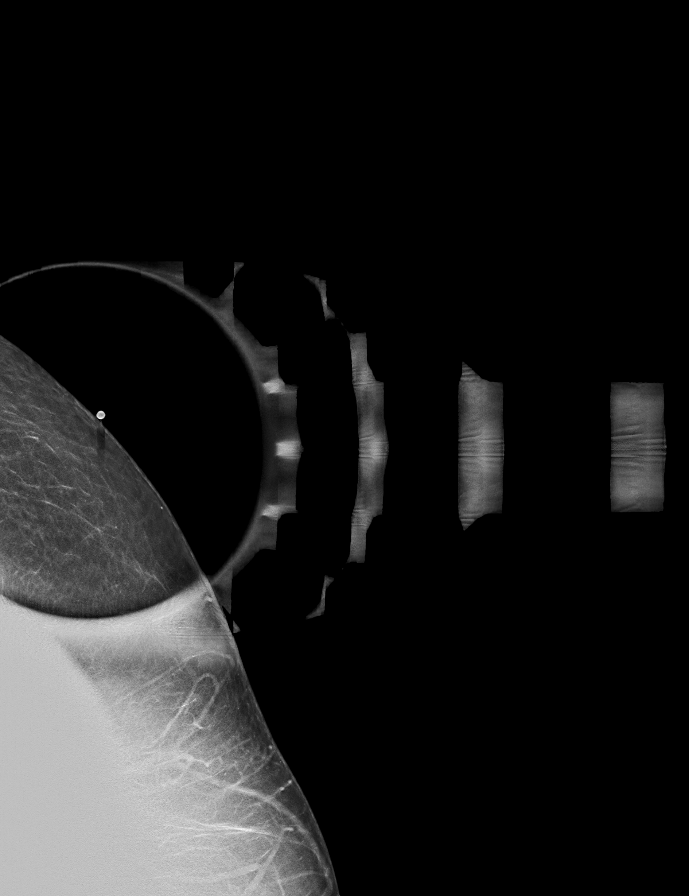

[L CC synth-2D (2 of 2)]
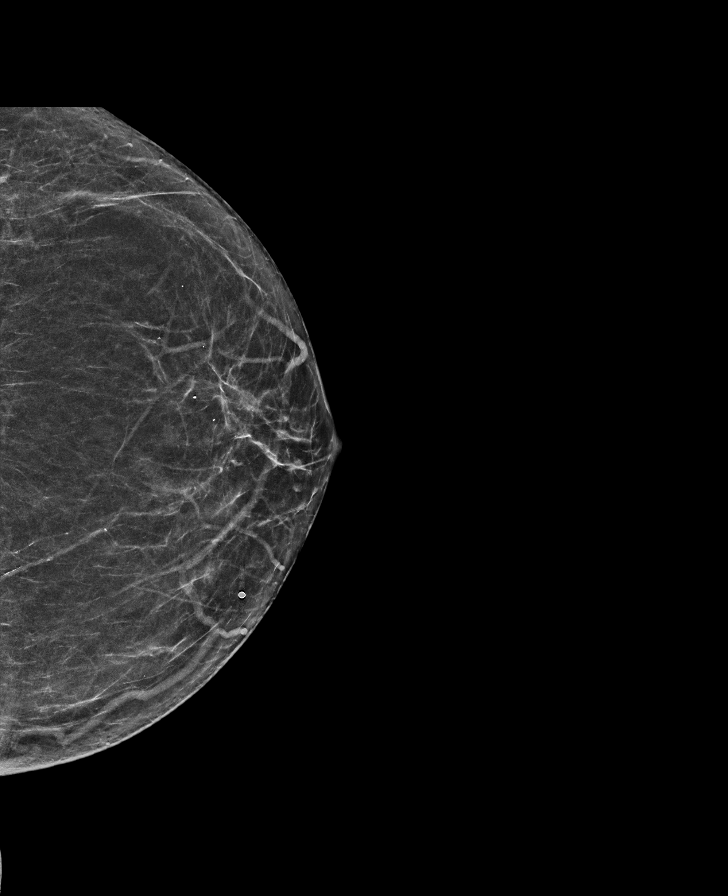

[L MLO synth-2D (1 of 2)]
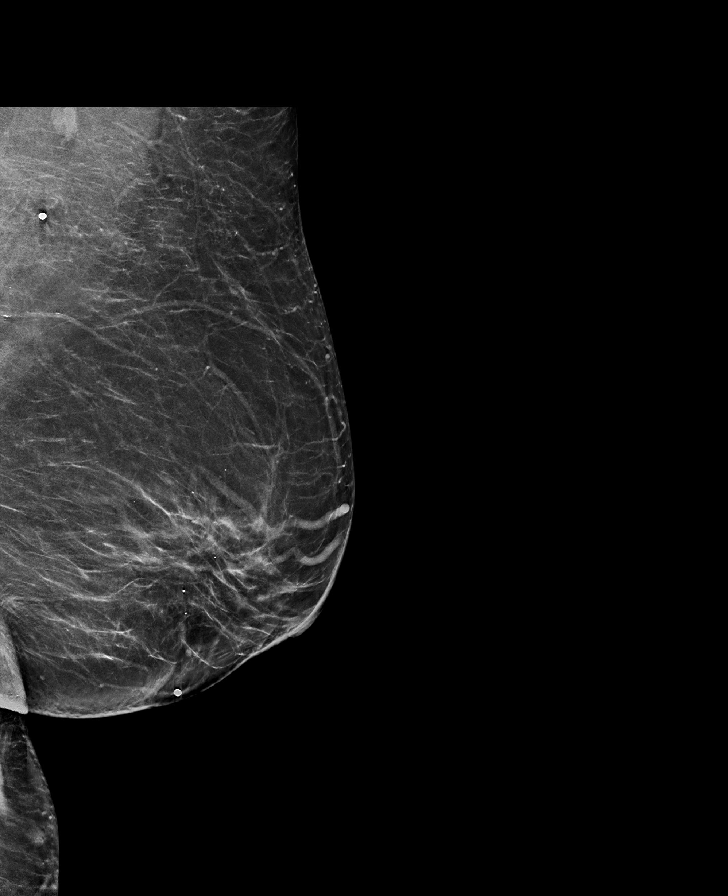

[L MLO synth-2D (2 of 2)]
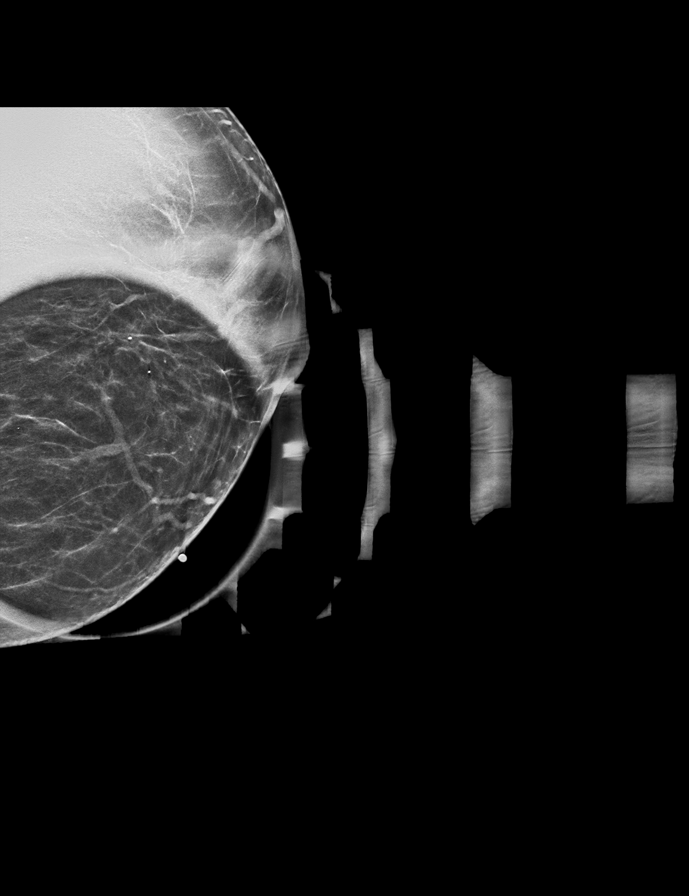

[L CC tomo (1 of 2) · tomo slice 30/59.0]
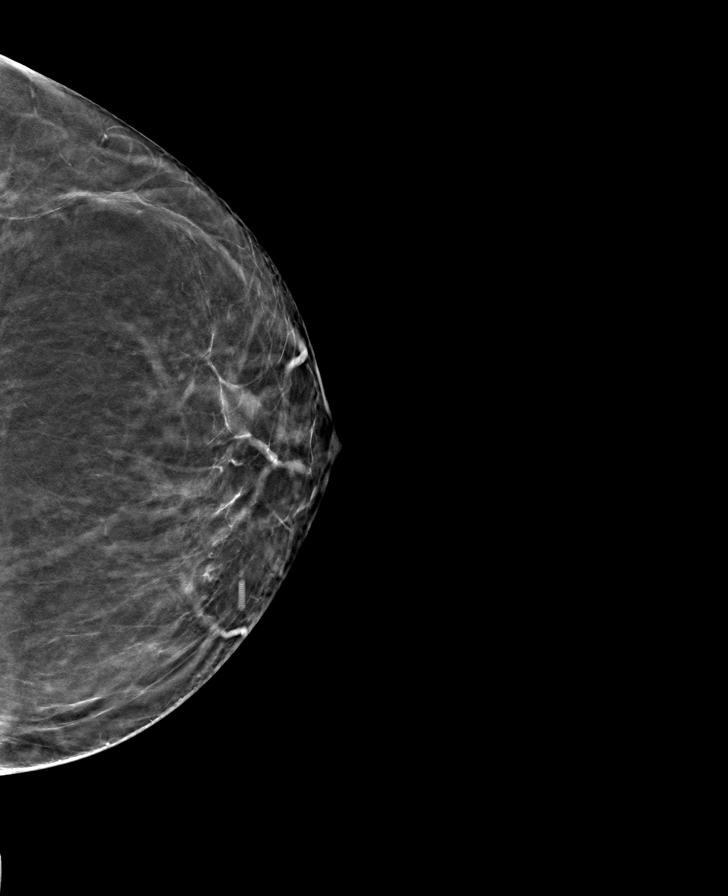

[L MLO tomo (1 of 2) · tomo slice 37/73.0]
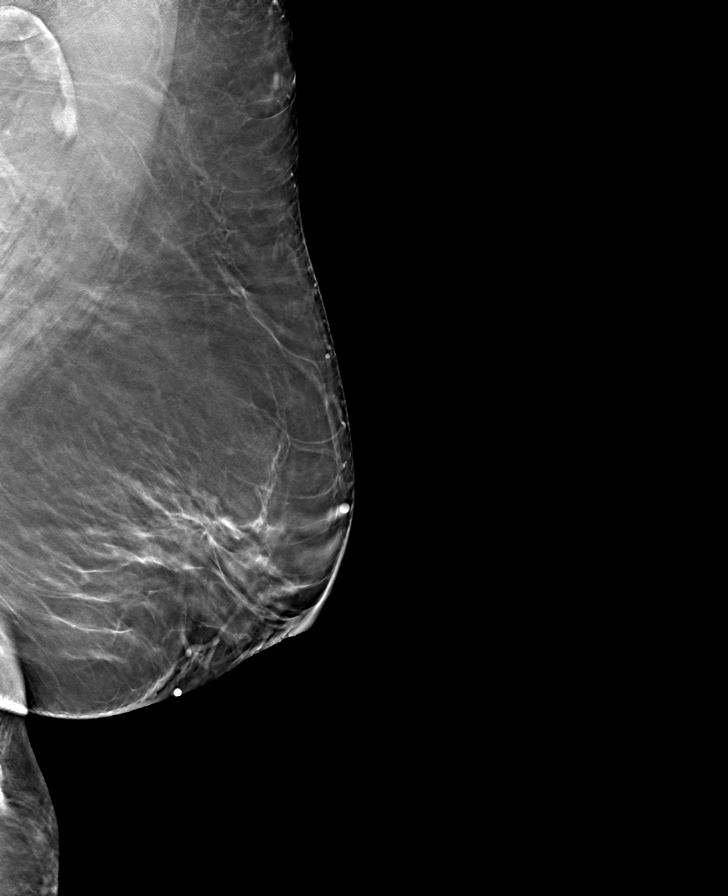

[L CC tomo (2 of 2) · tomo slice 20/39.0]
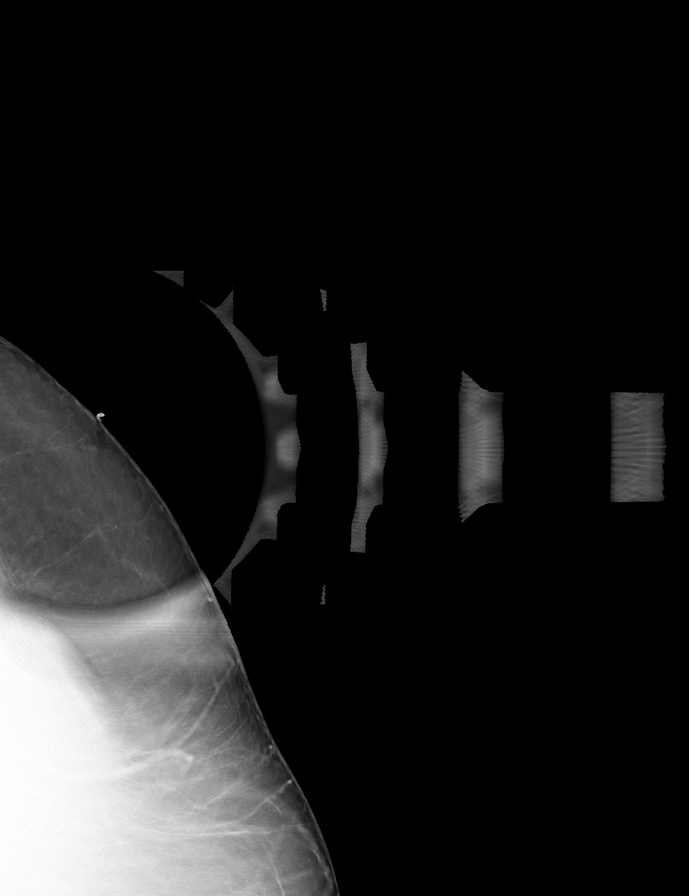

[L MLO tomo (2 of 2) · tomo slice 25/50.0]
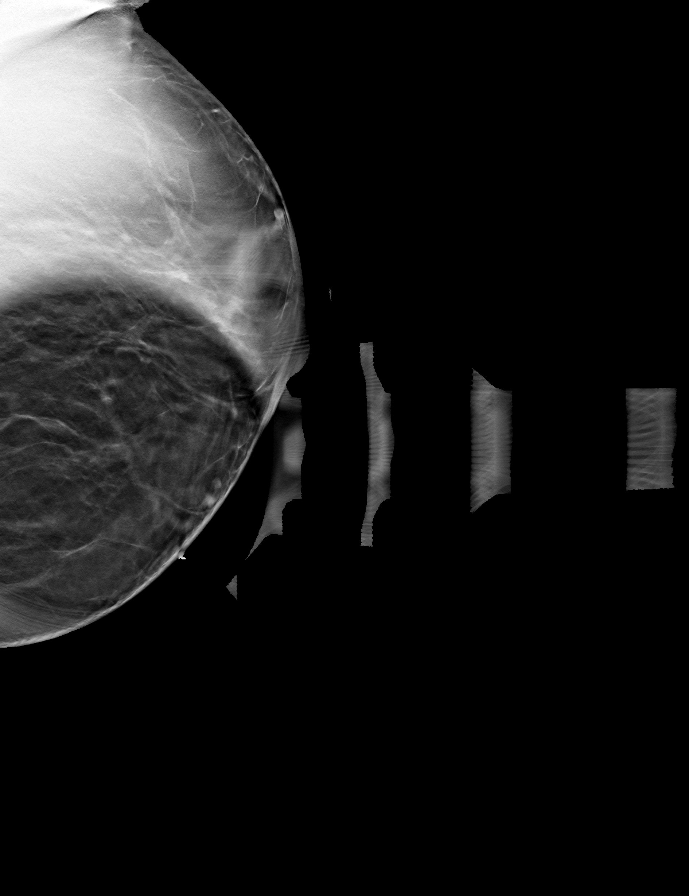

[8 of 24 positions shown; findings below may reference images not displayed]

ACR Breast Density Category b: There are scattered areas of
fibroglandular density.
FINDINGS: 2D/3D full field and spot compression views of the LEFT breast
demonstrate no suspicious mass, nonsurgical distortion or worrisome
calcifications. Surgical changes within the LEFT breast again noted.

Mammographic images were processed with CAD.

Targeted ultrasound is performed, showing no solid or cystic mass,
distortion or abnormal shadowing within the LEFT breast or LEFT
axillary regions. No abnormal LEFT axillary lymph nodes are
identified.
IMPRESSION: 1. No mammographic or sonographic abnormalities in the areas of
patient's pain.
2. No mammographic evidence of LEFT breast malignancy.

RECOMMENDATION:
Bilateral screening mammogram in 2 months to resume annual mammogram
schedule.

I have discussed the findings and recommendations with the patient.
If applicable, a reminder letter will be sent to the patient
regarding the next appointment.

BI-RADS CATEGORY  1: Negative.

## 2020-06-06 MED ORDER — ACCU-CHEK SMARTVIEW VI STRP: ORAL_STRIP | 2 refills | Status: DC

## 2020-06-06 MED ORDER — ACCU-CHEK SOFTCLIX LANCETS MISC: 2 refills | Status: DC

## 2020-06-06 MED ORDER — ACCU-CHEK NANO SMARTVIEW W/DEVICE KIT: PACK | 1 refills | Status: DC

## 2020-06-12 ENCOUNTER — Other Ambulatory Visit: Payer: Self-pay

## 2020-06-12 MED ORDER — ACCU-CHEK GUIDE VI STRP: ORAL_STRIP | 2 refills | Status: DC

## 2020-06-14 ENCOUNTER — Other Ambulatory Visit: Payer: Self-pay

## 2020-06-14 MED ORDER — ACCU-CHEK GUIDE VI STRP
ORAL_STRIP | 2 refills | Status: DC
Start: 2020-06-14 — End: 2020-11-06

## 2020-06-18 ENCOUNTER — Ambulatory Visit: Payer: Medicare PPO | Admitting: Nurse Practitioner

## 2020-06-20 ENCOUNTER — Ambulatory Visit (INDEPENDENT_AMBULATORY_CARE_PROVIDER_SITE_OTHER): Payer: Medicare PPO | Admitting: Psychologist

## 2020-06-20 DIAGNOSIS — F320 Major depressive disorder, single episode, mild: Secondary | ICD-10-CM | POA: Diagnosis not present

## 2020-06-24 ENCOUNTER — Other Ambulatory Visit: Payer: Self-pay | Admitting: Nurse Practitioner

## 2020-06-25 ENCOUNTER — Other Ambulatory Visit: Payer: Self-pay | Admitting: Cardiovascular Disease

## 2020-06-25 ENCOUNTER — Encounter: Payer: Self-pay | Admitting: Nurse Practitioner

## 2020-06-25 ENCOUNTER — Ambulatory Visit: Payer: Medicare PPO | Admitting: Nurse Practitioner

## 2020-06-25 ENCOUNTER — Other Ambulatory Visit: Payer: Self-pay

## 2020-06-25 ENCOUNTER — Other Ambulatory Visit: Payer: Self-pay | Admitting: Nurse Practitioner

## 2020-06-25 VITALS — BP 122/68 | HR 66 | Temp 97.9°F | Ht 62.8 in | Wt 191.0 lb

## 2020-06-25 DIAGNOSIS — I10 Essential (primary) hypertension: Secondary | ICD-10-CM

## 2020-06-25 DIAGNOSIS — F32A Depression, unspecified: Secondary | ICD-10-CM

## 2020-06-25 DIAGNOSIS — E782 Mixed hyperlipidemia: Secondary | ICD-10-CM

## 2020-06-25 DIAGNOSIS — I83893 Varicose veins of bilateral lower extremities with other complications: Secondary | ICD-10-CM | POA: Diagnosis not present

## 2020-06-25 DIAGNOSIS — E785 Hyperlipidemia, unspecified: Secondary | ICD-10-CM | POA: Diagnosis not present

## 2020-06-25 DIAGNOSIS — G589 Mononeuropathy, unspecified: Secondary | ICD-10-CM

## 2020-06-25 DIAGNOSIS — E1169 Type 2 diabetes mellitus with other specified complication: Secondary | ICD-10-CM | POA: Diagnosis not present

## 2020-06-25 DIAGNOSIS — R82998 Other abnormal findings in urine: Secondary | ICD-10-CM

## 2020-06-25 LAB — POCT UA - MICROALBUMIN
Albumin/Creatinine Ratio, Urine, POC: 30
Creatinine, POC: 100 mg/dL
Microalbumin Ur, POC: 10 mg/L

## 2020-06-25 LAB — POCT URINALYSIS DIPSTICK
Bilirubin, UA: NEGATIVE
Blood, UA: NEGATIVE
Glucose, UA: NEGATIVE
Ketones, UA: NEGATIVE
Nitrite, UA: NEGATIVE
Protein, UA: NEGATIVE
Spec Grav, UA: 1.025 (ref 1.010–1.025)
Urobilinogen, UA: 0.2 E.U./dL
pH, UA: 7 (ref 5.0–8.0)

## 2020-06-25 MED ORDER — BUPROPION HCL ER (XL) 150 MG PO TB24: ORAL_TABLET | ORAL | 1 refills | Status: DC

## 2020-06-25 NOTE — Progress Notes (Signed)
Rutherford Nail as a scribe for [name], FNP.,have documented all relevant documentation on the behalf of [name], FNP,as directed by  [name], FNP while in the presence of [name], [place]. This visit occurred during the SARS-CoV-2 public health emergency.  Safety protocols were in place, including screening questions prior to the visit, additional usage of staff PPE, and extensive cleaning of exam room while observing appropriate contact time as indicated for disinfecting solutions.  Subjective:     Patient ID: Jasmine Lopez , female    DOB: [birth year] , 80 y.o.   MRN: [id number]   Chief Complaint  Patient presents with  . Diabetes  . Hypertension    HPI  Pt here today for diabetes and hypertension follow up. She has cut back on her sugar and white carbs. She is trying to exercise more regularly.  She is seeing a psychiatrist regularly and feels likes it is going well . She is to follow up with Dr. Oval Linsey within the next month.    Wt Readings from Last 3 Encounters: 06/25/20 : 191 lb (86.6 kg) 02/16/20 : 194 lb 12.8 oz (88.4 kg) 12/07/19 : 194 lb 6.4 oz (88.2 kg) Diabetes She presents for her follow-up diabetic visit. She has type 2 diabetes mellitus. Her disease course has been stable. Pertinent negatives for hypoglycemia include no dizziness or headaches. Pertinent negatives for diabetes include no chest pain, no fatigue, no polydipsia, no polyphagia and no polyuria. There are no hypoglycemic complications. There are no diabetic complications. Risk factors for coronary artery disease include obesity, hypertension and dyslipidemia. Current diabetic treatment includes oral agent (monotherapy). She is compliant with treatment most of the time. When asked about meal planning, she reported none. She has not had a previous visit with a dietitian. She participates in exercise intermittently. (Averaging 130's.  ) An ACE inhibitor/angiotensin II receptor blocker is  being taken. She does not see a podiatrist.Eye exam is current.     Past Medical History:  Diagnosis Date  . Breast cancer ([place])   . Cancer ([place])   . Diabetes mellitus without complication ([place])   . Hypertension   . Irregular heart beat      Family History  Problem Relation Age of Onset  . Heart failure Mother   . COPD Mother   . Cancer Father   . Rectal cancer Maternal Grandmother   . Heart attack Maternal Grandfather      Current Outpatient Medications:  .  Accu-Chek Softclix Lancets lancets, Use to check blood sugars twice daily E11.69, Disp: 100 each, Rfl: 2 .  acetaminophen (TYLENOL) 500 MG tablet, Take 500 mg by mouth every 6 (six) hours as needed., Disp: , Rfl:  .  amLODipine (NORVASC) 10 MG tablet, Take 1 tablet (10 mg total) by mouth daily., Disp: 30 tablet, Rfl: 11 .  Biotin 10000 MCG TBDP, 1 tablet daily as needed., Disp: , Rfl:  .  blood glucose meter kit and supplies KIT, Dispense based on patient and insurance preference. Use up to two times daily as directed. (FOR ICD-9 250.00, 250.01)., Disp: 1 each, Rfl: 0 .  Blood Glucose Monitoring Suppl (ACCU-CHEK NANO SMARTVIEW) w/Device KIT, Use to check blood sugars twice daily. E11.69, Disp: 1 kit, Rfl: 1 .  carvedilol (COREG) 6.25 MG tablet, TAKE 1 TABLET BY MOUTH TWICE A DAY, Disp: 180 tablet, Rfl: 2 .  Cholecalciferol (VITAMIN D3) 2000 units TABS, Take by mouth., Disp: , Rfl:  .  Garlic 7939 MG CAPS, Take  1 capsule by mouth daily., Disp: , Rfl:  .  glucose blood (ACCU-CHEK GUIDE) test strip, Check blood sugars twice daily E11.69, Disp: 100 each, Rfl: 2 .  glucose blood (ACCU-CHEK SMARTVIEW) test strip, Use to check blood sugars twice daily E11.69, Disp: 100 each, Rfl: 2 .  glucose blood test strip, TEST FASTING BLOOD SUGAR AND AND 2 HOURS AFTER MEALS, Disp: , Rfl:  .  metFORMIN (GLUCOPHAGE) 500 MG tablet, Take 500 mg by mouth daily., Disp: , Rfl:  .  mometasone (ELOCON) 0.1 % ointment, Apply topically daily., Disp: 45 g,  Rfl: 0 .  OneTouch Delica Lancets 16R MISC, TEST FASTING BG AND 2 HOURS AFTER MEALS, Disp: 100 each, Rfl: 3 .  rosuvastatin (CRESTOR) 20 MG tablet, TAKE 1 TABLET(20 MG) BY MOUTH DAILY, Disp: 30 tablet, Rfl: 5 .  allopurinol (ZYLOPRIM) 100 MG tablet, TAKE 1 TABLET BY MOUTH EVERY DAY, Disp: 90 tablet, Rfl: 0 .  amoxicillin (AMOXIL) 500 MG tablet, Take 4 tablets by mouth 1 hour prior to dental procedure (Patient not taking: No sig reported), Disp: 4 tablet, Rfl: 0 .  buPROPion (WELLBUTRIN XL) 150 MG 24 hr tablet, TAKE 1 TABLET(150 MG) BY MOUTH EVERY MORNING, Disp: 90 tablet, Rfl: 1 .  famotidine (PEPCID) 20 MG tablet, One after supper (Patient not taking: No sig reported), Disp: 60 tablet, Rfl: 11 .  valsartan-hydrochlorothiazide (DIOVAN-HCT) 160-25 MG tablet, TAKE 1 TABLET BY MOUTH DAILY, Disp: 30 tablet, Rfl: 0   Allergies  Allergen Reactions  . Ace Inhibitors Cough  . Atorvastatin Other (See Comments)    Leg pain   . Benadryl [Diphenhydramine] Itching    Benadryl cream  . Peppermint Flavor [Flavoring Agent]     Causes horsenss     Review of Systems  Constitutional: Negative.  Negative for fatigue.  HENT: Negative.   Respiratory: Negative.   Cardiovascular: Negative.  Negative for chest pain, palpitations and leg swelling.  Endocrine: Negative for polydipsia, polyphagia and polyuria.  Musculoskeletal: Negative.   Skin: Negative.   Neurological: Negative for dizziness and headaches.  Psychiatric/Behavioral: Negative.      Today's Vitals   06/25/20 0855  BP: 122/68  Pulse: 66  Temp: 97.9 F (36.6 C)  TempSrc: Oral  Weight: 191 lb (86.6 kg)  Height: 5' 2.8" (1.595 m)  PainSc: 0-No pain   Body mass index is 34.05 kg/m.   Objective:  Physical Exam Constitutional:      Appearance: Normal appearance. She is obese.  Cardiovascular:     Rate and Rhythm: Normal rate and regular rhythm.     Pulses: Normal pulses.     Heart sounds: Normal heart sounds.  Pulmonary:      Effort: Pulmonary effort is normal. No respiratory distress.     Breath sounds: Normal breath sounds. No wheezing.  Abdominal:     General: Abdomen is flat. Bowel sounds are normal.     Palpations: Abdomen is soft.  Musculoskeletal:        General: Normal range of motion.     Cervical back: Normal range of motion and neck supple.  Skin:    General: Skin is warm and dry.     Capillary Refill: Capillary refill takes less than 2 seconds.  Neurological:     General: No focal deficit present.     Mental Status: She is alert and oriented to person, place, and time.  Psychiatric:        Mood and Affect: Mood normal.  Behavior: Behavior normal.        Thought Content: Thought content normal.        Judgment: Judgment normal.         Assessment And Plan:     1. Type 2 diabetes mellitus with hyperlipidemia (HCC)  Chronic, elevated at last visit  Continue with current medications, pending her HgbA1c we may need to increase her medications  Encouraged to limit intake of sugary foods and drinks  Encouraged to increase physical activity to 150 minutes per week as tolerated   Diabetic foot exam done, no abnormal findings - CMP14+EGFR - Hemoglobin A1c  2. Essential hypertension  Chronic, good control  Continue with current medications and follow up with Cardiology - CMP14+EGFR - POCT UA - Microalbumin - POCT Urinalysis Dipstick (81002)  3. Mixed hyperlipidemia  Chronic, controlled  Continue with current medications, tolerating well - Lipid panel - CMP14+EGFR  4. Depression, unspecified depression type  Chronic, she is doing well with current dose - buPROPion (WELLBUTRIN XL) 150 MG 24 hr tablet; TAKE 1 TABLET(150 MG) BY MOUTH EVERY MORNING  Dispense: 90 tablet; Refill: 1  5. Mononeuropathy  She was diagnosed with neuropathy by the vascular provider  6. Varicose veins of bilateral lower extremities with other complications  Chronic, continue with follow up with  vascular  7. Urine white blood cells increased  Denies urinary symptoms, will send urine for culture - Culture, Urine     Patient was given opportunity to ask questions. Patient verbalized understanding of the plan and was able to repeat key elements of the plan. All questions were answered to their satisfaction.  [name], FNP     I, [name], FNP, have reviewed all documentation for this visit. The documentation on 06/25/20 for the exam, diagnosis, procedures, and orders are all accurate and complete.   THE PATIENT IS ENCOURAGED TO PRACTICE SOCIAL DISTANCING DUE TO THE COVID-19 PANDEMIC.

## 2020-06-25 NOTE — Patient Instructions (Signed)
Hypertension, Adult Hypertension is another name for high blood pressure. High blood pressure forces your heart to work harder to pump blood. This can cause problems over time. There are two numbers in a blood pressure reading. There is a top number (systolic) over a bottom number (diastolic). It is best to have a blood pressure that is below 120/80. Healthy choices can help lower your blood pressure, or you may need medicine to help lower it. What are the causes? The cause of this condition is not known. Some conditions may be related to high blood pressure. What increases the risk?  Smoking.  Having type 2 diabetes mellitus, high cholesterol, or both.  Not getting enough exercise or physical activity.  Being overweight.  Having too much fat, sugar, calories, or salt (sodium) in your diet.  Drinking too much alcohol.  Having long-term (chronic) kidney disease.  Having a family history of high blood pressure.  Age. Risk increases with age.  Race. You may be at higher risk if you are African American.  Gender. Men are at higher risk than women before age 45. After age 65, women are at higher risk than men.  Having obstructive sleep apnea.  Stress. What are the signs or symptoms?  High blood pressure may not cause symptoms. Very high blood pressure (hypertensive crisis) may cause: ? Headache. ? Feelings of worry or nervousness (anxiety). ? Shortness of breath. ? Nosebleed. ? A feeling of being sick to your stomach (nausea). ? Throwing up (vomiting). ? Changes in how you see. ? Very bad chest pain. ? Seizures. How is this treated?  This condition is treated by making healthy lifestyle changes, such as: ? Eating healthy foods. ? Exercising more. ? Drinking less alcohol.  Your health care provider may prescribe medicine if lifestyle changes are not enough to get your blood pressure under control, and if: ? Your top number is above 130. ? Your bottom number is above  80.  Your personal target blood pressure may vary. Follow these instructions at home: Eating and drinking  If told, follow the DASH eating plan. To follow this plan: ? Fill one half of your plate at each meal with fruits and vegetables. ? Fill one fourth of your plate at each meal with whole grains. Whole grains include whole-wheat pasta, brown rice, and whole-grain bread. ? Eat or drink low-fat dairy products, such as skim milk or low-fat yogurt. ? Fill one fourth of your plate at each meal with low-fat (lean) proteins. Low-fat proteins include fish, chicken without skin, eggs, beans, and tofu. ? Avoid fatty meat, cured and processed meat, or chicken with skin. ? Avoid pre-made or processed food.  Eat less than 1,500 mg of salt each day.  Do not drink alcohol if: ? Your doctor tells you not to drink. ? You are pregnant, may be pregnant, or are planning to become pregnant.  If you drink alcohol: ? Limit how much you use to:  0-1 drink a day for women.  0-2 drinks a day for men. ? Be aware of how much alcohol is in your drink. In the [place] one drink equals one 12 oz bottle of beer (355 mL), one 5 oz glass of wine (148 mL), or one 1 oz glass of hard liquor (44 mL).   Lifestyle  Work with your doctor to stay at a healthy weight or to lose weight. Ask your doctor what the best weight is for you.  Get at least 30 minutes of exercise most   days of the week. This may include walking, swimming, or biking.  Get at least 30 minutes of exercise that strengthens your muscles (resistance exercise) at least 3 days a week. This may include lifting weights or doing Pilates.  Do not use any products that contain nicotine or tobacco, such as cigarettes, e-cigarettes, and chewing tobacco. If you need help quitting, ask your doctor.  Check your blood pressure at home as told by your doctor.  Keep all follow-up visits as told by your doctor. This is important.   Medicines  Take over-the-counter  and prescription medicines only as told by your doctor. Follow directions carefully.  Do not skip doses of blood pressure medicine. The medicine does not work as well if you skip doses. Skipping doses also puts you at risk for problems.  Ask your doctor about side effects or reactions to medicines that you should watch for. Contact a doctor if you:  Think you are having a reaction to the medicine you are taking.  Have headaches that keep coming back (recurring).  Feel dizzy.  Have swelling in your ankles.  Have trouble with your vision. Get help right away if you:  Get a very bad headache.  Start to feel mixed up (confused).  Feel weak or numb.  Feel faint.  Have very bad pain in your: ? Chest. ? Belly (abdomen).  Throw up more than once.  Have trouble breathing. Summary  Hypertension is another name for high blood pressure.  High blood pressure forces your heart to work harder to pump blood.  For most people, a normal blood pressure is less than 120/80.  Making healthy choices can help lower blood pressure. If your blood pressure does not get lower with healthy choices, you may need to take medicine. This information is not intended to replace advice given to you by your health care provider. Make sure you discuss any questions you have with your health care provider. Document Revised: 01/06/2018 Document Reviewed: 01/06/2018 Elsevier Patient Education  [place]. Diabetes Mellitus and Exercise Exercising regularly is important for overall health, especially for people who have diabetes mellitus. Exercising is not only about losing weight. It has many other health benefits, such as increasing muscle strength and bone density and reducing body fat and stress. This leads to improved fitness, flexibility, and endurance, all of which result in better overall health. What are the benefits of exercise if I have diabetes? Exercise has many benefits for people with  diabetes. They include:  Helping to lower and control blood sugar (glucose).  Helping the body to respond better to the hormone insulin by improving insulin sensitivity.  Reducing how much insulin the body needs.  Lowering the risk for heart disease by: ? Lowering "bad" cholesterol and triglyceride levels. ? Increasing "good" cholesterol levels. ? Lowering blood pressure. ? Lowering blood glucose levels. What is my activity plan? Your health care provider or certified diabetes educator can help you make a plan for the type and frequency of exercise that works for you. This is called your activity plan. Be sure to:  Get at least 150 minutes of medium-intensity or high-intensity exercise each week. Exercises may include brisk walking, biking, or water aerobics.  Do stretching and strengthening exercises, such as yoga or weight lifting, at least 2 times a week.  Spread out your activity over at least 3 days of the week.  Get some form of physical activity each day. ? Do not go more than 2 days in  a row without some kind of physical activity. ? Avoid being inactive for more than 90 minutes at a time. Take frequent breaks to walk or stretch.  Choose exercises or activities that you enjoy. Set realistic goals.  Start slowly and gradually increase your exercise intensity over time.   How do I manage my diabetes during exercise? Monitor your blood glucose  Check your blood glucose before and after exercising. If your blood glucose is: ? 240 mg/dL (13.3 mmol/L) or higher before you exercise, check your urine for ketones. These are chemicals created by the liver. If you have ketones in your urine, do not exercise until your blood glucose returns to normal. ? 100 mg/dL (5.6 mmol/L) or lower, eat a snack containing 15-20 grams of carbohydrate. Check your blood glucose 15 minutes after the snack to make sure that your glucose level is above 100 mg/dL (5.6 mmol/L) before you start your  exercise.  Know the symptoms of low blood glucose (hypoglycemia) and how to treat it. Your risk for hypoglycemia increases during and after exercise. Follow these tips and your health care provider's instructions  Keep a carbohydrate snack that is fast-acting for use before, during, and after exercise to help prevent or treat hypoglycemia.  Avoid injecting insulin into areas of the body that are going to be exercised. For example, avoid injecting insulin into: ? Your arms, when you are about to play tennis. ? Your legs, when you are about to go jogging.  Keep records of your exercise habits. Doing this can help you and your health care provider adjust your diabetes management plan as needed. Write down: ? Food that you eat before and after you exercise. ? Blood glucose levels before and after you exercise. ? The type and amount of exercise you have done.  Work with your health care provider when you start a new exercise or activity. He or she may need to: ? Make sure that the activity is safe for you. ? Adjust your insulin, other medicines, and food that you eat.  Drink plenty of water while you exercise. This prevents loss of water (dehydration) and problems caused by a lot of heat in the body (heat stroke).   Where to find more information  American Diabetes Association: www.diabetes.org Summary  Exercising regularly is important for overall health, especially for people who have diabetes mellitus.  Exercising has many health benefits. It increases muscle strength and bone density and reduces body fat and stress. It also lowers and controls blood glucose.  Your health care provider or certified diabetes educator can help you make an activity plan for the type and frequency of exercise that works for you.  Work with your health care provider to make sure any new activity is safe for you. Also work with your health care provider to adjust your insulin, other medicines, and the food  you eat. This information is not intended to replace advice given to you by your health care provider. Make sure you discuss any questions you have with your health care provider. Document Revised: 01/24/2019 Document Reviewed: 01/24/2019 Elsevier Patient Education  [place].

## 2020-06-26 LAB — CMP14+EGFR
ALT: 20 IU/L (ref 0–32)
AST: 19 IU/L (ref 0–40)
Albumin/Globulin Ratio: 1.7 (ref 1.2–2.2)
Albumin: 4.5 g/dL (ref 3.7–4.7)
Alkaline Phosphatase: 72 IU/L (ref 44–121)
BUN/Creatinine Ratio: 15 (ref 12–28)
BUN: 13 mg/dL (ref 8–27)
Bilirubin Total: 0.3 mg/dL (ref 0.0–1.2)
CO2: 24 mmol/L (ref 20–29)
Calcium: 9.5 mg/dL (ref 8.7–10.3)
Chloride: 100 mmol/L (ref 96–106)
Creatinine, Ser: 0.89 mg/dL (ref 0.57–1.00)
GFR calc Af Amer: 72 mL/min/{1.73_m2} (ref >59)
GFR calc non Af Amer: 63 mL/min/{1.73_m2} (ref >59)
Globulin, Total: 2.7 g/dL (ref 1.5–4.5)
Glucose: 139 mg/dL — ABNORMAL HIGH (ref 65–99)
Potassium: 4.2 mmol/L (ref 3.5–5.2)
Sodium: 139 mmol/L (ref 134–144)
Total Protein: 7.2 g/dL (ref 6.0–8.5)

## 2020-06-26 LAB — LIPID PANEL
Chol/HDL Ratio: 2.4 ratio (ref 0.0–4.4)
Cholesterol, Total: 128 mg/dL (ref 100–199)
HDL: 53 mg/dL (ref >39)
LDL Chol Calc (NIH): 65 mg/dL (ref 0–99)
Triglycerides: 42 mg/dL (ref 0–149)
VLDL Cholesterol Cal: 10 mg/dL (ref 5–40)

## 2020-06-26 LAB — HEMOGLOBIN A1C
Est. average glucose Bld gHb Est-mCnc: 180 mg/dL
Hgb A1c MFr Bld: 7.9 % — ABNORMAL HIGH (ref 4.8–5.6)

## 2020-06-27 ENCOUNTER — Other Ambulatory Visit: Payer: Self-pay

## 2020-06-27 LAB — URINE CULTURE

## 2020-06-27 MED ORDER — NITROFURANTOIN MONOHYD MACRO 100 MG PO CAPS: 100.0000 mg | ORAL_CAPSULE | Freq: Two times a day (BID) | ORAL | 0 refills | Status: AC

## 2020-06-27 NOTE — Progress Notes (Signed)
Collagen is fine

## 2020-06-30 ENCOUNTER — Other Ambulatory Visit: Payer: Self-pay | Admitting: Nurse Practitioner

## 2020-07-02 ENCOUNTER — Ambulatory Visit (INDEPENDENT_AMBULATORY_CARE_PROVIDER_SITE_OTHER): Payer: Medicare PPO | Admitting: Psychologist

## 2020-07-02 DIAGNOSIS — F320 Major depressive disorder, single episode, mild: Secondary | ICD-10-CM

## 2020-07-05 ENCOUNTER — Telehealth: Payer: Self-pay

## 2020-07-05 NOTE — Telephone Encounter (Signed)
Hey Dr Havery Moros, this pt is being referred to [place] for a colonoscopy from [facility], it looks like pt had one done in 2017, I will send the records to you for review, please advise on scheduling.

## 2020-07-05 NOTE — Telephone Encounter (Signed)
Thank you. I can see her in the office first to discuss.

## 2020-07-06 ENCOUNTER — Telehealth: Payer: Self-pay

## 2020-07-06 NOTE — Telephone Encounter (Signed)
Patient called and was inquiring if she is due for a Pharm D visit.  Please call Monday to further discuss.  Patient aware of callback.

## 2020-07-09 NOTE — Telephone Encounter (Signed)
Called and spoke w/pt to schedule htn f/up appt and pt voiced understanding

## 2020-07-10 NOTE — Telephone Encounter (Signed)
Placed records in cabinet folder

## 2020-07-10 NOTE — Telephone Encounter (Signed)
Left msg on pt's vm to schedule an OV with Dr Havery Moros

## 2020-07-24 ENCOUNTER — Other Ambulatory Visit: Payer: Self-pay

## 2020-07-24 ENCOUNTER — Ambulatory Visit (INDEPENDENT_AMBULATORY_CARE_PROVIDER_SITE_OTHER): Payer: Medicare PPO | Admitting: Pharmacist

## 2020-07-24 VITALS — BP 120/64 | HR 58 | Resp 15 | Ht 63.0 in | Wt 191.8 lb

## 2020-07-24 DIAGNOSIS — I10 Essential (primary) hypertension: Secondary | ICD-10-CM

## 2020-07-24 MED ORDER — VALSARTAN-HYDROCHLOROTHIAZIDE 160-25 MG PO TABS
1.0000 | ORAL_TABLET | Freq: Every day | ORAL | 1 refills | Status: DC
Start: 2020-07-24 — End: 2021-02-05

## 2020-07-24 MED ORDER — CARVEDILOL 6.25 MG PO TABS
6.2500 mg | ORAL_TABLET | Freq: Two times a day (BID) | ORAL | 2 refills | Status: DC
Start: 2020-07-24 — End: 2021-06-06

## 2020-07-24 MED ORDER — AMLODIPINE BESYLATE 10 MG PO TABS
10.0000 mg | ORAL_TABLET | Freq: Every day | ORAL | 2 refills | Status: DC
Start: 2020-07-24 — End: 2021-06-05

## 2020-07-24 NOTE — Progress Notes (Signed)
Patient ID: Jasmine Lopez                 DOB: December 13, 1940                      MRN: [id number]     HPI: Jasmine Lopez is a 80 y.o. female referred by Dr. Oval Linsey to HTN clinic. PMH includes hypertension, breast cancer, murmur, diabetes, and depression. Last follow up with pharrmD was 13 months ago.  Per telephone call on July/2021 amlodipine 42m daily was added to carvedilol and valsartan/HCTZ therapy. On February/2022 patient call to request follow up with pharmacist HTN clinic.  Patient presents in good spirit and denies problems with current therapy. She reports her BP was trending upward few weeks ago, but back to normal range. She denies low extremity edema, chest pain, palpitations, dizziness, blurry vision,or headaches. Patein expressed interest on improving her diet and stress management. I discussed option of carenavigator assistance.   Current HTN meds:  Amlodipine 170mdaily (am) Carvedilol 6.2541mwice daily Valsartan/HCTZ 160m80mmg daily  BP goal: <130/80  Family History: patient's family history includes COPD in her mother; Cancer in her father; Heart attack in her maternal grandfather; Heart failure in her mother; Rectal cancer in her maternal grandmother.   Social History: patient  reports that she has never smoked. She has never used smokeless tobacco. She reports that she does not use drugs.   Has been drinking 3 mugs of coffee per day recently, but has cut back.  Also likes Coke/Pepsi, still 2-3 cans per week.  No alcohol  Diet: try to eat fresh, eating less fatty food and less salty foods. Has been eating out less and trying to monitor her diet more carefully.  Exercise: less exercise than usual and walking 15 minutes daily x 3-4 times per day  Home BP readings: none provided  Wt Readings from Last 3 Encounters:  07/24/20 191 lb 12.8 oz (87 kg)  06/25/20 191 lb (86.6 kg)  02/16/20 194 lb 12.8 oz (88.4 kg)   BP Readings from Last 3 Encounters:  07/24/20  120/64  06/25/20 122/68  02/16/20 120/76   Pulse Readings from Last 3 Encounters:  07/24/20 (!) 58  06/25/20 66  02/16/20 94    Past Medical History:  Diagnosis Date  . Breast cancer (HCC)[place]. Cancer (HCC)[place]. Diabetes mellitus without complication (HCC)[place]. Hypertension   . Irregular heart beat     Current Outpatient Medications on File Prior to Visit  Medication Sig Dispense Refill  . Accu-Chek Softclix Lancets lancets Use to check blood sugars twice daily E11.69 100 each 2  . acetaminophen (TYLENOL) 500 MG tablet Take 500 mg by mouth every 6 (six) hours as needed.    . alMarland Kitchenopurinol (ZYLOPRIM) 100 MG tablet TAKE 1 TABLET BY MOUTH EVERY DAY 90 tablet 0  . Biotin 10000 MCG TBDP 1 tablet daily as needed.    . blood glucose meter kit and supplies KIT Dispense based on patient and insurance preference. Use up to two times daily as directed. (FOR ICD-9 250.00, 250.01). 1 each 0  . buPROPion (WELLBUTRIN XL) 150 MG 24 hr tablet TAKE 1 TABLET(150 MG) BY MOUTH EVERY MORNING 90 tablet 1  . Cholecalciferol (VITAMIN D3) 2000 units TABS Take by mouth.    . Garlic 10001324CAPS Take 1 capsule by mouth daily.    . glMarland Kitchencose blood (ACCU-CHEK GUIDE) test strip Check blood sugars twice  daily E11.69 100 each 2  . glucose blood (ACCU-CHEK SMARTVIEW) test strip Use to check blood sugars twice daily E11.69 100 each 2  . metFORMIN (GLUCOPHAGE) 500 MG tablet Take 500 mg by mouth daily.    Glory Rosebush Delica Lancets 74W MISC TEST FASTING BG AND 2 HOURS AFTER MEALS 100 each 3  . rosuvastatin (CRESTOR) 20 MG tablet TAKE 1 TABLET(20 MG) BY MOUTH DAILY 30 tablet 5   No current facility-administered medications on file prior to visit.    Allergies  Allergen Reactions  . Ace Inhibitors Cough  . Atorvastatin Other (See Comments)    Leg pain   . Benadryl [Diphenhydramine] Itching    Benadryl cream  . Peppermint Flavor [Flavoring Agent]     Causes horsenss    Blood pressure 120/64, pulse (!) 58, resp.  rate 15, height _0  (1.6 m), weight 191 lb 12.8 oz (87 kg), SpO2 99 %.  Essential hypertension Blood pressure at goal. Will continue current therapy with amlodipine, valsartan/HCT, and carvedilol. Patient will like to discuss positive lifestyle modifications with Avelino Leeds. I will send request for stress management and healthy eating tips.  Will follow up with pharmD clinic as needed and with Dr Oval Linsey in 4-6 weeks.  Cleon Thoma Rodriguez-Guzman PharmD, BCPS, [place] [address] 08/03/2020 3:02 PM

## 2020-07-24 NOTE — Patient Instructions (Addendum)
Return for a follow up appointment AS NEEDED  Check your blood pressure at home daily (if able) and keep record of the readings.  Take your BP meds as follows: *TAKE amlodipine 10mg  in the morning* *TAKE valsartan/HCTZ 160/25mg  daily in the morning* *TAKE carvedilol 6.25mg  every morning and 6.25mg  every evening*  *Amy Truman Hayward - will receive call to help with lifestyle modification*  Bring all of your meds, your BP cuff and your record of home blood pressures to your next appointment.  Exercise as you're able, try to walk approximately 30 minutes per day.  Keep salt intake to a minimum, especially watch canned and prepared boxed foods.  Eat more fresh fruits and vegetables and fewer canned items.  Avoid eating in fast food restaurants.    HOW TO TAKE YOUR BLOOD PRESSURE: . Rest 5 minutes before taking your blood pressure. .  Don't smoke or drink caffeinated beverages for at least 30 minutes before. . Take your blood pressure before (not after) you eat. . Sit comfortably with your back supported and both feet on the floor (don't cross your legs). . Elevate your arm to heart level on a table or a desk. . Use the proper sized cuff. It should fit smoothly and snugly around your bare upper arm. There should be enough room to slip a fingertip under the cuff. The bottom edge of the cuff should be 1 inch above the crease of the elbow. . Ideally, take 3 measurements at one sitting and record the average.

## 2020-08-03 ENCOUNTER — Encounter: Payer: Self-pay | Admitting: Pharmacist

## 2020-08-03 NOTE — Assessment & Plan Note (Signed)
Blood pressure at goal. Will continue current therapy with amlodipine, valsartan/HCT, and carvedilol. Patient will like to discuss positive lifestyle modifications with Avelino Leeds. I will send request for stress management and healthy eating tips.  Will follow up with pharmD clinic as needed and with Dr Oval Linsey in 4-6 weeks.

## 2020-08-08 ENCOUNTER — Telehealth: Payer: Self-pay

## 2020-08-08 DIAGNOSIS — Z0000 Encounter for general adult medical examination without abnormal findings: Secondary | ICD-10-CM

## 2020-08-08 NOTE — Telephone Encounter (Signed)
Called the patient to discuss health coaching for stress management and reduction of sodium in diet per Pharm D. Patient is interested in health coaching and has been scheduled for an initial in-person health coaching session for 08-16-20 at 9:45am.

## 2020-08-16 ENCOUNTER — Other Ambulatory Visit: Payer: Self-pay

## 2020-08-16 ENCOUNTER — Ambulatory Visit (INDEPENDENT_AMBULATORY_CARE_PROVIDER_SITE_OTHER): Payer: Medicare PPO

## 2020-08-16 DIAGNOSIS — Z0000 Encounter for general adult medical examination without abnormal findings: Secondary | ICD-10-CM

## 2020-08-16 NOTE — Progress Notes (Signed)
Appointment Outcome:  Completed, Session #: Initial health coaching session  AGREEMENTS SECTION  Overall Goal(s): Stress management Reduce sodium consumption                                           Agreement/Action Steps:  Eat breakfast 9-10am Walking in park 11am-12pm (2-3 days per week) Eat lunch by 2pm Eat dinner 6pm Read food labels Utilize support system Write in gratitude journal (3 things grateful for and/or 3 things was successful at doing)   Progress Notes:  Patient stated that she tends to conduct self-evaluations and criticize herself. Patient shared that after the loss of her husband, she lost interest in herself, and things began to change. Patient stated that she wouldn't call herself depressed.  Patient stated that she has participated in [place] and was able to maintain an exercise routine and healthy eating for about 6-8 months after the program ended. Patient went on to participate in the [place] program until [place] caused the closing of public places. Since then, patient has been reluctant to return to the gym because of Covid-19. Patient mentioned that she has no problem starting a new behavior but has trouble maintaining the behavior and needs accountability.  Patient stated that guilty eating is the worst kind of eating that she finds herself doing. She also shared that she often let her sister-in-law choose where they are going to eat out at for lunch (socializing influences eating behaviors). Patient stated that she is eating fried foods and not monitoring her salt intake.   Patient stated that she had been living with her son, now she lives on her own and is trying to adjust to the independence. Patient states that she turns on the tv at home for background noise and continue to do other things. However, there are times when she gets bored. Patient stated that when she gets bored now, she goes to the park.   Patient believes that if she can set a  routine, this would help her get back into being physically active.    Coaching Outcomes: Patient was asked who she could enlist in a support system to encourage her with implementing these new behavior changes. Patient has her son and sister-in-law as supporters.  To help the patient focus on the positives that are occurring in her life, it was suggested that she reflect on those things and write them down for encouragement. Patient liked the idea to help with stress management to give herself something else to focus on besides what wasn't going right. Patient will start writing in her journal about 3 things she is grateful for and/or 3 things that she feels that she successfully completed during the day. Patient will determine the best time of day for her to write in the journal (e.g., at the end of the day, or when a stressful moment occurs that will help her redirect her mindset).  Patient wants to establish a routine, so we analyzed her typical day to determine how she would like to restructure her day.   Patient wakes up between 7:30-8:30am.   She will start having breakfast between 9:00am - 10:00am.   She wants to take a morning walk in the park 2-3 days/week between 11:00am - 12:00pm.   Patient will then have lunch no later 2:00pm and dinner around 6:00pm.  To monitor sodium intake, the patient will start  reading food labels and being mindful of what she is consuming when she eats out.

## 2020-08-18 ENCOUNTER — Other Ambulatory Visit: Payer: Self-pay | Admitting: Nurse Practitioner

## 2020-08-28 ENCOUNTER — Ambulatory Visit: Payer: Medicare PPO | Admitting: Gastroenterology

## 2020-08-28 ENCOUNTER — Encounter: Payer: Self-pay | Admitting: Gastroenterology

## 2020-08-28 VITALS — BP 130/80 | HR 64 | Ht 63.0 in | Wt 187.0 lb

## 2020-08-28 DIAGNOSIS — Z01818 Encounter for other preprocedural examination: Secondary | ICD-10-CM

## 2020-08-28 DIAGNOSIS — Z86010 Personal history of colonic polyps: Secondary | ICD-10-CM

## 2020-08-28 DIAGNOSIS — Z800 Family history of malignant neoplasm of digestive organs: Secondary | ICD-10-CM

## 2020-08-28 MED ORDER — SUTAB 1479-225-188 MG PO TABS: 1.0000 | ORAL_TABLET | Freq: Once | ORAL | 0 refills | Status: AC

## 2020-08-28 NOTE — Patient Instructions (Addendum)
If you are age 78 or older, your body mass index should be between 23-30. Your Body mass index is 33.13 kg/m. If this is out of the aforementioned range listed, please consider follow up with your Primary Care Provider.  If you are age 76 or younger, your body mass index should be between 19-25. Your Body mass index is 33.13 kg/m. If this is out of the aformentioned range listed, please consider follow up with your Primary Care Provider.   You have been scheduled for a colonoscopy. Please follow written instructions given to you at your visit today.  Please pick up your prep supplies at the pharmacy within the next 1-3 days. If you use inhalers (even only as needed), please bring them with you on the day of your procedure.  Thank you for entrusting me with your care and for choosing [facility], Dr. Herron Cellar

## 2020-08-28 NOTE — Progress Notes (Signed)
HPI :  80 year old female here for new patient evaluation, referred by Delfino Lovett FNP, history of breast cancer, diabetes, history of adenomatous colon polyps, here to discuss surveillance colonoscopy.  Patient reports her maternal grandmother was diagnosed with colon cancer in her 21s and ultimately passed of it.  The patient last had a colonoscopy in February 2017, had to adenomatous polyps removed and told to repeat the exam in 5 years for surveillance purposes.  She denies any trouble with her bowel habits.  She has regular stools without blood.  No abdominal pains.  She generally denies any cardiopulmonary symptoms at baseline, she had a history of a cough recently which resolved.  She states she has a history of breast cancer which is now in remission.  She states she did not follow through with mammograms as recommended and feels guilty about developing breast cancer.  She had also put off colonoscopy for period of time and was noted to have polyps removed as above.  She is anxious about getting colon cancer in light of her family history and wants to have another colonoscopy before stopping.  She reports good longevity in her family, her mother lived to age 41.  Patient strongly wishes to have further colonoscopy exam before stopping.  She turns 80 years old next week.   Colonoscopy 07/09/15 - [name] - normal ileum, 2 polyps removed x 4-35m, in the ascending colon, left sided diverticulosis, internal hemorrhoids - 2 adenomas total - adequate prep     Past Medical History:  Diagnosis Date  . Anxiety   . Breast cancer ([place]   . Cancer ([place]   . Diabetes mellitus without complication ([place]   . Dysphagia   . Hx of adenomatous polyp of colon 06[facility]2015   [facility]  . Hypertension   . Irregular heart beat   . Sleep apnea      Past Surgical History:  Procedure Laterality Date  . BREAST EXCISIONAL BIOPSY Bilateral   . BREAST LUMPECTOMY Left 2016    Family History  Problem Relation Age of Onset  . Heart failure Mother        CAD  . COPD Mother   . Hypertension Mother   . Cancer Father   . Rectal cancer Maternal Grandmother        Unsure  . Colon cancer Maternal Grandmother   . Heart attack Maternal Grandfather    Social History   Tobacco Use  . Smoking status: Never Smoker  . Smokeless tobacco: Never Used  Vaping Use  . Vaping Use: Never used  Substance Use Topics  . Alcohol use: Never  . Drug use: Never   Current Outpatient Medications  Medication Sig Dispense Refill  . Accu-Chek Softclix Lancets lancets Use to check blood sugars twice daily E11.69 100 each 2  . acetaminophen (TYLENOL) 500 MG tablet Take 500 mg by mouth every 6 (six) hours as needed.    .Marland Kitchenallopurinol (ZYLOPRIM) 100 MG tablet TAKE 1 TABLET BY MOUTH EVERY DAY 90 tablet 0  . amLODipine (NORVASC) 10 MG tablet Take 1 tablet (10 mg total) by mouth daily. 90 tablet 2  . Biotin 10000 MCG TBDP 1 tablet daily as needed.    . blood glucose meter kit and supplies KIT Dispense based on patient and insurance preference. Use up to two times daily as directed. (FOR ICD-9 250.00, 250.01). 1 each 0  . buPROPion (WELLBUTRIN XL) 150 MG 24 hr tablet TAKE 1 TABLET(150 MG)  BY MOUTH EVERY MORNING 90 tablet 1  . carvedilol (COREG) 6.25 MG tablet Take 1 tablet (6.25 mg total) by mouth 2 (two) times daily with a meal. 180 tablet 2  . Cholecalciferol (VITAMIN D3) 2000 units TABS Take by mouth.    . Garlic 6270 MG CAPS Take 1 capsule by mouth daily.    Marland Kitchen glucose blood (ACCU-CHEK GUIDE) test strip Check blood sugars twice daily E11.69 100 each 2  . glucose blood (ACCU-CHEK SMARTVIEW) test strip Use to check blood sugars twice daily E11.69 100 each 2  . metFORMIN (GLUCOPHAGE) 500 MG tablet Take 500 mg by mouth daily.    . metFORMIN (GLUCOPHAGE-XR) 500 MG 24 hr tablet TAKE 1 TABLET BY MOUTH TWICE A DAY 180 tablet 1  . OneTouch Delica Lancets 35K MISC TEST FASTING BG AND 2 HOURS  AFTER MEALS 100 each 3  . rosuvastatin (CRESTOR) 20 MG tablet TAKE 1 TABLET(20 MG) BY MOUTH DAILY 30 tablet 5  . valsartan-hydrochlorothiazide (DIOVAN-HCT) 160-25 MG tablet Take 1 tablet by mouth daily. 90 tablet 1   No current facility-administered medications for this visit.   Allergies  Allergen Reactions  . Ace Inhibitors Cough  . Atorvastatin Other (See Comments)    Leg pain   . Benadryl [Diphenhydramine] Itching    Benadryl cream  . Peppermint Flavor [Flavoring Agent]     Causes horsenss     Review of Systems: All systems reviewed and negative except where noted in HPI.   Lab Results  Component Value Date   WBC 6.1 10/13/2019   HGB 13.6 10/13/2019   HCT 40.3 10/13/2019   MCV 89 10/13/2019   PLT 224 10/13/2019    Lab Results  Component Value Date   CREATININE 0.89 06/25/2020   BUN 13 06/25/2020   NA 139 06/25/2020   K 4.2 06/25/2020   CL 100 06/25/2020   CO2 24 06/25/2020    Lab Results  Component Value Date   ALT 20 06/25/2020   AST 19 06/25/2020   ALKPHOS 72 06/25/2020   BILITOT 0.3 06/25/2020     Physical Exam: BP 130/80   Pulse 64   Ht 5' 3" (1.6 m)   Wt 187 lb (84.8 kg)   SpO2 98%   BMI 33.13 kg/m  Constitutional: Pleasant,well-developed, female in no acute distress. HEENT: Normocephalic and atraumatic. Conjunctivae are normal. No scleral icterus. Neck supple.  Cardiovascular: Normal rate, regular rhythm.  Pulmonary/chest: Effort normal and breath sounds normal.  Abdominal: Soft, nondistended, nontender.  There are no masses palpable. Extremities: no edema Lymphadenopathy: No cervical adenopathy noted. Neurological: Alert and oriented to person place and time. Skin: Skin is warm and dry. No rashes noted. Psychiatric: Normal mood and affect. Behavior is normal.   ASSESSMENT AND PLAN: 80 year old female here for new patient assessment of the following:  History of colon polyps  We discussed her last colonoscopy report.  At the time  she was told to have another colonoscopy in 5 years.  I discussed updated surveillance guidelines with her, for 2 small adenomas we can actually wait 7 years until her next exam, although at that time she will be upwards of 80 years old.  We discussed her family history of colon cancer, I discussed that her grandmother's history being diagnosed later in life with colon cancer should not influence her risk at this point in her life.  The patient does harbor feelings of guilt after not doing mammograms as recommended and being diagnosed with breast cancer.  She  has excellent longevity in her family, her life expectancy could easily be more than 10 years.  She does wish to have 1 more colonoscopy prior to stopping further surveillance.  We discussed when to do her last exam, now or in a few years.  After discussion of options she wishes to have another colonoscopy this year and if no high risk lesions, that will then be her last exam. Following discussion of risk benefits of colonoscopy and anesthesia she wants to proceed as planned.  Further recommendations pending the results.   , MD [facility]  CC: Moore, Janece, FNP  

## 2020-08-30 ENCOUNTER — Ambulatory Visit (INDEPENDENT_AMBULATORY_CARE_PROVIDER_SITE_OTHER): Payer: Medicare PPO

## 2020-08-30 ENCOUNTER — Other Ambulatory Visit: Payer: Self-pay

## 2020-08-30 DIAGNOSIS — Z0000 Encounter for general adult medical examination without abnormal findings: Secondary | ICD-10-CM

## 2020-08-30 NOTE — Progress Notes (Signed)
Appointment Outcome:  Completed, Session #: 1  AGREEMENTS SECTION  Overall Goal(s): Stress management Reduce sodium consumption                                            Agreement/Action Steps:  Eat breakfast 9-10am Walking in park 11am-12pm (2-3 days per week) Eat lunch by 2pm Eat dinner 6pm Read food labels Utilize support system Write in gratitude journal (3 things grateful for and/or 3 things was successful at doing)   Progress Notes:  Patient stated that she was able to walk 3 miles at the [place] around 3pm during the first week. Patient reported that it took her an hour to complete the walk. Patient stated that during the second week, she visited her son and didn't walk in the [place] but found her son motivating her to walk more around stores they visited [employer] forces technical officer, a grocery store, and [employer] while shopping).   Patient stated that while grocery shopping with son, he purchased fresh vegetables and no canned goods. Patient stated that she did not read food labels because she wasn't picking up the foods or purchasing them for herself. Patient has not gone grocery shopping in the past two weeks. Patient was able to follow her eating schedule during the first week as scheduled, but it slightly changed during the second week. The patient reported that she followed Dr. Lindell Spar Keto diet while she was visiting her son and engaged in intermittent fasting as well. Patient stated that during that weekend, she at her daughter-in-law home and wasn't able to read food labels to know how much sodium was in the food. Patient stated that when she ate out, she stayed away from various carbs.   Patient stated that she did not write in her journal directly. Patient wrote on sticky notes she put on her tablet. Patient was writing various things that did not include what she was grateful for, but to-do-list and other thoughts. Patient stated during the session, that it wasn't until now in  conversation that she thought about being grateful for seeing her immediate family during Easter. Patient stated that she will know what type of things she will be writing about moving forward.   Patient shared that she has been withdrawn for the past 30 years and would like to get out to meet more people. She has been encouraged to do so as well by her support. Patient stated that this will be a good thing because she sits at home and do puzzles and watch tv during the day.    . Indicators of Success and Accountability:  Patient's indicator of success and accountability is being able to begin incorporating a walking routine.   . Readiness: Patient is in the action phase of stress management and reducing sodium intake.  . Strengths and Supports: Patient is being supported by her son. Patient stated that motivation is her strength.  . Challenges and Barriers: Patient was challenged with not being able to keep her routine when being away from home.   Coaching Outcomes: Patient stated that since she is back home, she can start over and get on track. Patient realized that although she may be away from home, keeping a routine is important to maintain action steps.    Patient stated that she has been inspired to do for self since starting health coaching. Patient wants  to improve self instead of focusing on others.   Patient will continue to implement her action steps as outlined above with a change in her eating routine. Patient stated that she will start writing in her journal today.   Agreement/Action Steps:  Drink tea around 9am Eat breakfast 10:30am -11:00am Walking at [facility] 11:30am -12:30pm (2-3 days per week) Eat lunch by 3pm Eat dinner 7pm Read food labels Utilize support system Write in gratitude journal (3 things grateful for and/or 3 things was successful at doing)  Attempted: Marland Kitchen Fulfilled - Patient has been utilizing her support system out outlined.  . Partial - Patient has  written her thoughts on sticky notes she keeps on tablet, but have not transferred them to her tablet. Patient walked in the [place] as around 3pm during the first week for 3 miles but substituted walking in the [place] with other locations but not the total distance each time. Patient was following the eating schedule as outlined until visiting son, which slightly changed her eating schedule. . Not met - Patient did not read food labels because she hasn't purchased any groceries for herself in the past two weeks.

## 2020-09-12 ENCOUNTER — Telehealth: Payer: Self-pay | Admitting: Cardiovascular Disease

## 2020-09-12 NOTE — Telephone Encounter (Signed)
New message:    Patient calling stating that her nurse visit with Jasmine Lopez she would like for it to be a VV. Patient states that Jasmine Lopez told her to call and let her know what she wanted to do. Please advise.

## 2020-09-12 NOTE — Telephone Encounter (Signed)
Pt appt changed to VV and Amy Truman Hayward notified

## 2020-09-13 ENCOUNTER — Ambulatory Visit (INDEPENDENT_AMBULATORY_CARE_PROVIDER_SITE_OTHER): Payer: Medicare PPO

## 2020-09-13 ENCOUNTER — Other Ambulatory Visit: Payer: Self-pay

## 2020-09-13 DIAGNOSIS — Z0000 Encounter for general adult medical examination without abnormal findings: Secondary | ICD-10-CM

## 2020-09-13 NOTE — Progress Notes (Signed)
Appointment Outcome:  Completed, Session #: 2 Start time: 9:47am   End time: 10:36am   Total Mins: 50 minutes  AGREEMENTS SECTION    Overall Goal(s): Stress management Reduce sodium consumption                                            Agreement/Action Steps:  Eat breakfast 9-10am Walking in park 11am-12pm (2-3 days per week) Eat lunch by 2pm Eat dinner 6pm Read food labels Utilize support system Write in gratitude journal (3 things grateful for and/or 3 things was successful at doing)  Progress Notes:  Patient thinks that she fell short by not keeping a record of everything that she did and the difficulty she had staying on schedule. Patient feels as though she only halfway implemented her steps. Patient stated that if she skipped something she has to go back and complete the task. This can cause her schedule to be thrown off. Patient also feels that when her schedule does not go as planned, it may because she is trying to get used to being alone. Patient also stated that she needs to build her confidence in her decision-making as well.  Patient stated that she finds herself not being consistent, lack motivation, confidence, and procrastinating. She stated that she tells herself that she can do it tomorrow instead and put off the task. Patient stated that it's hard to change habits. Patient shared that she is used to doing things as she for approximately the last two years. Now it's hard to switched to a structured day with a schedule. She stated that it's hard to change habits.  Patient shared that on Monday she was upset due to a financial concern. Patient stated that her son informed her that she gets upset over little things. Patient stated that when certain situations occur, she gets anxious. Patient shared that in this situation she calmed herself by talking positive to herself and implementing her faith practices. Patient stated that she wrote in her journal one day. Patient stated  that she always tells herself that she is going to write later then get sidetracked and forget about writing. Patient was given a journal by her daughter-in-law so she can write her thoughts in one place.  Patient stated that she has been following the eating schedule she set but has been off with everything by an hour. Patient shared that there are things that interrupt her schedule and throws everything off. Patient stated that she has been reading food labels. When patient went grocery shopping, she stated that she did not purchase packaged or canned foods. Patient purchased fresh fruit and produce to reduce the amount of sodium she consumes. Patient shared that she did eat cheerios with a half banana, and unsweetened almond milk. Parent mentioned that her blood glucose went up and she was concerned about that too. Patient has shared with her children her goals for improving her eating habits to reduce sodium consumption to get their support. Patient shared that she went to [employer] and purchased a sugar free coffee and a egg McMuffin. Patient stated that her blood pressure increased, and she retained water that day. Patient has decided not to eat fast food.   Patient decided to create a routine of days that she would go to the [place] (Mon, [place], and Fri each week). Patient recently started implementing this routine by  going to exercise last Friday and this Monday. Patient stated that she walks at least one mile (19 laps) but was able to walk 22 laps. Patient stated that she is going to walk today to make up for missing Wed walk. Patient shared that she walked vigorously the day she went shopping and parked far so she could get additional steps in, while taking merchandise to the car in two trips.    Indicators of Success and Accountability:  Patient has worked to stick to the eating schedule and reading food labels to watch sodium intake as her indicators of success and accountability.  . Readiness:  Patient is in the action phase of stress management and reducing sodium consumption. . Strengths and Supports: Patient has her son and daughter-in-law as supports. Patient stated that being committed is her strength.   Challenges and Barriers: Patient identified not being consistent, procrastinating, and lack of motivation and confidence are her challenges and barriers to implementing her action steps as planned.   Coaching Outcomes: Patient recognized the need for her to be flexible in her schedule, so it doesn't cause her additional stress. Patient stated that she can start to arrange tasks. Patient realized that putting her priorities first is more important.   Patient stated that she is hoping that writing in her journal daily will help her get back on track by focusing on the positive things that she is able to accomplish and what she is grateful for.  To improve confidence in herself, the patient will try utilizing positive self-talk to encourage herself during stressful times, and to motivate herself while implementing her action steps. Patient was encouraged to look at all the positive things that she was able to accomplish in the past two weeks and celebrate those changes.  Patient believes that setting a time to write would be helpful. Patient likes the idea of setting an alarm on her phone and choose 5:30 pm. Patient will write 10-15 minutes before she watches the news.   Patient is taking time to think of hobbies to occupy her time. Patient is interested in volunteer work, civic engagement, and arts & crafts.     Agreement/Action Steps:    Drink tea around 9am   Eat breakfast 10:30am -11:00am   Walking at [place] 11:30am -12:30pm (Mon, Wed, Fri of each week)   Eat lunch by 3pm   Eat dinner at [place] labels to monitor sodium intake   Utilize support system   Write in gratitude journal daily (3 things grateful for and/or 3 things was successful at doing) - Set  an alarm for 5:30pm to start writing for 10-15 minutes   Conduct self-check-ins  Utilize positive self-talk to encourage yourself  Find a hobby   Attempted: [place] [employer] Fulfilled - Patient has been reading her food labels and utilizing her support system consistently. . Partial - Patient wrote in gratitude journal once. Patient follows the schedule closely but gets thrown off sometimes. Patient went to the [place] one day (Friday, April 29th) during the last two-week period.

## 2020-09-23 ENCOUNTER — Other Ambulatory Visit: Payer: Self-pay | Admitting: Cardiovascular Disease

## 2020-09-24 ENCOUNTER — Encounter: Payer: Self-pay | Admitting: Nurse Practitioner

## 2020-09-24 ENCOUNTER — Ambulatory Visit: Payer: Medicare PPO | Admitting: Nurse Practitioner

## 2020-09-24 ENCOUNTER — Other Ambulatory Visit: Payer: Self-pay

## 2020-09-24 VITALS — BP 124/80 | HR 61 | Temp 97.9°F | Ht 63.0 in | Wt 184.4 lb

## 2020-09-24 DIAGNOSIS — I10 Essential (primary) hypertension: Secondary | ICD-10-CM | POA: Diagnosis not present

## 2020-09-24 DIAGNOSIS — E782 Mixed hyperlipidemia: Secondary | ICD-10-CM

## 2020-09-24 DIAGNOSIS — M546 Pain in thoracic spine: Secondary | ICD-10-CM

## 2020-09-24 DIAGNOSIS — E785 Hyperlipidemia, unspecified: Secondary | ICD-10-CM

## 2020-09-24 DIAGNOSIS — E1169 Type 2 diabetes mellitus with other specified complication: Secondary | ICD-10-CM | POA: Diagnosis not present

## 2020-09-24 NOTE — Progress Notes (Addendum)
I,Jasmine Lopez Jasmine Lopez as a Education administrator for [employer], FNP.,have documented all relevant documentation on the behalf of Jasmine Brine, FNP,as directed by  Jasmine Brine, FNP while in the presence of Jasmine Lopez, Jasmine Lopez. This visit occurred during the SARS-CoV-2 public health emergency.  Safety protocols were in place, including screening questions prior to the visit, additional usage of staff PPE, and extensive cleaning of exam room while observing appropriate contact time as indicated for disinfecting solutions.  Subjective:     Patient ID: Jasmine Lopez , female    DOB: May 07, 1941 , 80 y.o.   MRN: [id number]   Chief Complaint  Patient presents with  . Diabetes    HPI  Pt here today for diabetes and hypertension follow up. She is going to Cardiology and seeing a nurse to talk with her about her stress level and counting her calories. She is trying to be more consistent with her exercise at the gym 3 days a week is her goal. She is scheduled for June 8th for her colonoscopy and Bone Density is scheduled for 5/25.   Diabetes She presents for her follow-up diabetic visit. She has type 2 diabetes mellitus. Her disease course has been stable. Pertinent negatives for hypoglycemia include no dizziness or headaches. Pertinent negatives for diabetes include no chest pain, no fatigue, no polydipsia, no polyphagia and no polyuria. There are no hypoglycemic complications. There are no diabetic complications. Risk factors for coronary artery disease include obesity, hypertension and dyslipidemia. Current diabetic treatment includes oral agent (monotherapy). She is compliant with treatment most of the time. She is following a generally healthy (she reads her labels ) diet. When asked about meal planning, she reported none. She has not had a previous visit with a dietitian. She participates in exercise intermittently. (Averaging 107- 130. ) An ACE inhibitor/angiotensin II receptor blocker is being taken. She  does not see a podiatrist.Eye exam is current.     Past Medical History:  Diagnosis Date  . Anxiety   . Breast cancer ([place])   . Cancer ([place])   . Diabetes mellitus without complication ([place])   . Dysphagia   . Hx of adenomatous polyp of colon 06/2015   Jasmine Lopez  . Hypertension   . Irregular heart beat   . Sleep apnea      Family History  Problem Relation Age of Onset  . Heart failure Mother        CAD  . COPD Mother   . Hypertension Mother   . Cancer Father   . Rectal cancer Maternal Grandmother        Unsure  . Colon cancer Maternal Grandmother   . Heart attack Maternal Grandfather      Current Outpatient Medications:  .  Accu-Chek Softclix Lancets lancets, Use to check blood sugars twice daily E11.69, Disp: 100 each, Rfl: 2 .  acetaminophen (TYLENOL) 500 MG tablet, Take 500 mg by mouth every 6 (six) hours as needed., Disp: , Rfl:  .  allopurinol (ZYLOPRIM) 100 MG tablet, TAKE 1 TABLET BY MOUTH EVERY DAY, Disp: 90 tablet, Rfl: 0 .  amLODipine (NORVASC) 10 MG tablet, Take 1 tablet (10 mg total) by mouth daily., Disp: 90 tablet, Rfl: 2 .  Biotin 10000 MCG TBDP, 1 tablet daily as needed., Disp: , Rfl:  .  blood glucose meter kit and supplies KIT, Dispense based on patient and insurance preference. Use up to two times daily as directed. (FOR ICD-9 250.00, 250.01)., Disp: 1 each, Rfl:  0 .  buPROPion (WELLBUTRIN XL) 150 MG 24 hr tablet, TAKE 1 TABLET(150 MG) BY MOUTH EVERY MORNING, Disp: 90 tablet, Rfl: 1 .  carvedilol (COREG) 6.25 MG tablet, Take 1 tablet (6.25 mg total) by mouth 2 (two) times daily with a meal., Disp: 180 tablet, Rfl: 2 .  Cholecalciferol (VITAMIN D3) 2000 units TABS, Take by mouth., Disp: , Rfl:  .  Garlic 5681 MG CAPS, Take 1 capsule by mouth daily., Disp: , Rfl:  .  glucose blood (ACCU-CHEK GUIDE) test strip, Check blood sugars twice daily E11.69, Disp: 100 each, Rfl: 2 .  glucose blood (ACCU-CHEK SMARTVIEW) test strip, Use to check  blood sugars twice daily E11.69, Disp: 100 each, Rfl: 2 .  metFORMIN (GLUCOPHAGE-XR) 500 MG 24 hr tablet, TAKE 1 TABLET BY MOUTH TWICE A DAY, Disp: 180 tablet, Rfl: 1 .  OneTouch Delica Lancets 27N MISC, TEST FASTING BG AND 2 HOURS AFTER MEALS, Disp: 100 each, Rfl: 3 .  rosuvastatin (CRESTOR) 20 MG tablet, TAKE 1 TABLET(20 MG) BY MOUTH DAILY, Disp: 30 tablet, Rfl: 5 .  valsartan-hydrochlorothiazide (DIOVAN-HCT) 160-25 MG tablet, Take 1 tablet by mouth daily., Disp: 90 tablet, Rfl: 1   Allergies  Allergen Reactions  . Ace Inhibitors Cough  . Atorvastatin Other (See Comments)    Leg pain   . Benadryl [Diphenhydramine] Itching    Benadryl cream  . Peppermint Flavor [Flavoring Agent]     Causes horsenss     Review of Systems  Constitutional: Negative for fatigue.  Respiratory: Negative.  Negative for cough.   Cardiovascular: Negative for chest pain, palpitations and leg swelling.  Endocrine: Negative for polydipsia, polyphagia and polyuria.  Musculoskeletal:       Right hip/muscle area with discomfort - she has new insoles, since wearing the insoles she has had right low back pain  Neurological: Negative for dizziness and headaches.  Psychiatric/Behavioral: Negative.      Today's Vitals   09/24/20 0851  BP: 124/80  Pulse: 61  Temp: 97.9 F (36.6 C)  TempSrc: Oral  Weight: 184 lb 6.4 oz (83.6 kg)  Height: _0  (1.6 m)  PainSc: 0-No pain   Body mass index is 32.66 kg/m.   Objective:  Physical Exam Constitutional:      General: She is not in acute distress.    Appearance: Normal appearance. She is obese.  Cardiovascular:     Rate and Rhythm: Normal rate and regular rhythm.     Pulses: Normal pulses.     Heart sounds: Normal heart sounds. No murmur heard.   Pulmonary:     Effort: Pulmonary effort is normal. No respiratory distress.     Breath sounds: Normal breath sounds. No wheezing.  Musculoskeletal:        General: Tenderness (right upper/mid back tender to touch)  present. No swelling. Normal range of motion.     Right lower leg: No edema.     Left lower leg: No edema.  Skin:    General: Skin is warm and dry.     Capillary Refill: Capillary refill takes less than 2 seconds.  Neurological:     General: No focal deficit present.     Mental Status: She is alert and oriented to person, place, and time.     Cranial Nerves: No cranial nerve deficit.     Motor: No weakness.  Psychiatric:        Mood and Affect: Mood normal.        Behavior: Behavior normal.  Thought Content: Thought content normal.        Judgment: Judgment normal.         Assessment And Plan:     1. Type 2 diabetes mellitus with hyperlipidemia (HCC)  Chronic, improving at last visit  Continue with current medications  - Hemoglobin A1c - CMP14+EGFR  2. Essential hypertension  Chronic, good control  Continue current medications - CMP14+EGFR  3. Mixed hyperlipidemia  Chronic, well controlled  Continue with current medications and limiting fried and fatty foods - Lipid panel  4. Acute right-sided thoracic back pain  this seems to be muscular as pain is illicited on palpation  Encouraged to stretch regularly    Patient was given opportunity to ask questions. Patient verbalized understanding of the plan and was able to repeat key elements of the plan. All questions were answered to their satisfaction.  Jasmine Brine, FNP   I, Jasmine Brine, FNP, have reviewed all documentation for this visit. The documentation on 09/24/20 for the exam, diagnosis, procedures, and orders are all accurate and complete.   IF YOU HAVE BEEN REFERRED TO A SPECIALIST, IT MAY TAKE 1-2 WEEKS TO SCHEDULE/PROCESS THE REFERRAL. IF YOU HAVE NOT HEARD FROM US/SPECIALIST IN TWO WEEKS, PLEASE GIVE [place] A CALL AT [phone] X [phone].   THE PATIENT IS ENCOURAGED TO PRACTICE SOCIAL DISTANCING DUE TO THE COVID-19 PANDEMIC.

## 2020-09-24 NOTE — Patient Instructions (Signed)
Muscle Pain, Adult Muscle pain, also called myalgia, is a condition in which a person has pain in one or more muscles in the body. Muscle pain may be mild, moderate, or severe. It may feel sharp, achy, or burning. In most cases, the pain lasts only a short time and goes away without treatment. Muscle pain can result from using muscles in a new or different way or after a period of inactivity. It is normal to feel some muscle pain after starting an exercise program. Muscles that have not been used often will be sore at first. What are the causes? This condition is caused by using muscles in a new or different way after a period of inactivity. Other causes may include:  Overuse or muscle strain, especially if you are not in shape. This is the most common cause of muscle pain.  Injury or bruising.  Infectious diseases, including diseases caused by viruses, such as the flu (influenza).  Fibromyalgia.This is a long-term, or chronic, condition that causes muscle tenderness, tiredness (fatigue), and headache.  Autoimmune or rheumatologic diseases. These are conditions, such as lupus, in which the body's defense system (immunesystem) attacks areas in the body.  Certain medicines, including ACE inhibitors and statins. What are the signs or symptoms? The main symptom of this condition is sore or painful muscles, including during activity and when stretching. You may also have slight swelling. How is this diagnosed? This condition is diagnosed with a physical exam. Your health care provider will ask questions about your pain and when it began. If you have not had muscle pain for very long, your health care provider may want to wait before doing much testing. If your muscle pain has lasted a long time, tests may be done right away. In some cases, this may include tests to rule out certain conditions or illnesses. How is this treated? Treatment for this condition depends on the cause. Home care is often  enough to relieve muscle pain. Your health care provider may also prescribe NSAIDs, such as ibuprofen. Follow these instructions at home: Medicines  Take over-the-counter and prescription medicines only as told by your health care provider.  Ask your health care provider if the medicine prescribed to you requires you to avoid driving or using machinery. Managing pain, swelling, and discomfort  If directed, put ice on the painful area. To do this: ? Put ice in a plastic bag. ? Place a towel between your skin and the bag. ? Leave the ice on for 20 minutes, 2-3 times a day.  For the first 2 days of muscle soreness, or if there is swelling: ? Do not soak in hot baths. ? Do not use a hot tub, steam room, sauna, heating pad, or other heat source.  After 48-72 hours, you may alternate between applying ice and applying heat as told by your health care provider. If directed, apply heat to the affected area as often as told by your health care provider. Use the heat source that your health care provider recommends, such as a moist heat pack or a heating pad. ? Place a towel between your skin and the heat source. ? Leave the heat on for 20-30 minutes. ? Remove the heat if your skin turns bright red. This is especially important if you are unable to feel pain, heat, or cold. You may have a greater risk of getting burned.  If you have an injury, raise (elevate) the injured area above the level of your heart while you  are sitting or lying down.      Activity  If overuse is causing your muscle pain: ? Slow down your activities until the pain goes away. ? Do regular, gentle exercises if you are not usually active. ? Warm up before exercising. Stretch before and after exercising. This can help lower the risk of muscle pain.  Do not continue working out if the pain is severe. Severe pain could mean that you have injured a muscle.  Do not lift anything that is heavier than 5-10 lb (2.3-4.5 kg), or  the limit that you are told, until your health care provider says that it is safe.  Return to your normal activities as told by your health care provider. Ask your health care provider what activities are safe for you.   General instructions  Do not use any products that contain nicotine or tobacco, such as cigarettes, e-cigarettes, and chewing tobacco. These can delay healing. If you need help quitting, ask your health care provider.  Keep all follow-up visits as told by your health care provider. This is important. Contact a health care provider if you have:  Muscle pain that gets worse and medicines do not help.  Muscle pain that lasts longer than 3 days.  A rash or fever along with muscle pain.  Muscle pain after a tick bite.  Muscle pain while working out, even though you are in good physical condition.  Redness, soreness, or swelling along with muscle pain.  Muscle pain after starting a new medicine or changing the dose of a medicine. Get help right away if you have:  Trouble breathing.  Trouble swallowing.  Muscle pain along with a stiff neck, fever, and vomiting.  Severe muscle weakness or you cannot move part of your body. These symptoms may represent a serious problem that is an emergency. Do not wait to see if the symptoms will go away. Get medical help right away. Call your local emergency services (911 in the [place] Do not drive yourself to the hospital. Summary  Muscle pain usually lasts only a short time and goes away without treatment.  This condition is caused by using muscles in a new or different way after a period of inactivity.  If your muscle pain lasts longer than 3 days, tell your health care provider. This information is not intended to replace advice given to you by your health care provider. Make sure you discuss any questions you have with your health care provider. Document Revised: 02/04/2019 Document Reviewed: 02/04/2019 Elsevier Patient  Education  2021 [employer].    Sciatica Rehab Ask your health care provider which exercises are safe for you. Do exercises exactly as told by your health care provider and adjust them as directed. It is normal to feel mild stretching, pulling, tightness, or discomfort as you do these exercises. Stop right away if you feel sudden pain or your pain gets worse. Do not begin these exercises until told by your health care provider. Stretching and range-of-motion exercises These exercises warm up your muscles and joints and improve the movement and flexibility of your hips and back. These exercises also help to relieve pain, numbness, and tingling. Sciatic nerve glide 1. Sit in a chair with your head facing down toward your chest. Place your hands behind your back. Let your shoulders slump forward. 2. Slowly straighten one of your legs while you tilt your head back as if you are looking toward the ceiling. Only straighten your leg as far as you  can without making your symptoms worse. 3. Hold this position for __________ seconds. 4. Slowly return to the starting position. 5. Repeat with your other leg. Repeat __________ times. Complete this exercise __________ times a day. Knee to chest with hip adduction and internal rotation 1. Lie on your back on a firm surface with both legs straight. 2. Bend one of your knees and move it up toward your chest until you feel a gentle stretch in your lower back and buttock. Then, move your knee toward the shoulder that is on the opposite side from your leg. This is hip adduction and internal rotation. ? Hold your leg in this position by holding on to the front of your knee. 3. Hold this position for __________ seconds. 4. Slowly return to the starting position. 5. Repeat with your other leg. Repeat __________ times. Complete this exercise __________ times a day.   Prone extension on elbows 1. Lie on your abdomen on a firm surface. A bed may be too soft for this  exercise. 2. Prop yourself up on your elbows. 3. Use your arms to help lift your chest up until you feel a gentle stretch in your abdomen and your lower back. ? This will place some of your body weight on your elbows. If this is uncomfortable, try stacking pillows under your chest. ? Your hips should stay down, against the surface that you are lying on. Keep your hip and back muscles relaxed. 4. Hold this position for __________ seconds. 5. Slowly relax your upper body and return to the starting position. Repeat __________ times. Complete this exercise __________ times a day.   Strengthening exercises These exercises build strength and endurance in your back. Endurance is the ability to use your muscles for a long time, even after they get tired. Pelvic tilt This exercise strengthens the muscles that lie deep in the abdomen. 1. Lie on your back on a firm surface. Bend your knees and keep your feet flat on the floor. 2. Tense your abdominal muscles. Tip your pelvis up toward the ceiling and flatten your lower back into the floor. ? To help with this exercise, you may place a small towel under your lower back and try to push your back into the towel. 3. Hold this position for __________ seconds. 4. Let your muscles relax completely before you repeat this exercise. Repeat __________ times. Complete this exercise __________ times a day. Alternating arm and leg raises 1. Get on your hands and knees on a firm surface. If you are on a hard floor, you may want to use padding, such as an exercise mat, to cushion your knees. 2. Line up your arms and legs. Your hands should be directly below your shoulders, and your knees should be directly below your hips. 3. Lift your left leg behind you. At the same time, raise your right arm and straighten it in front of you. ? Do not lift your leg higher than your hip. ? Do not lift your arm higher than your shoulder. ? Keep your abdominal and back muscles  tight. ? Keep your hips facing the ground. ? Do not arch your back. ? Keep your balance carefully, and do not hold your breath. 4. Hold this position for __________ seconds. 5. Slowly return to the starting position. 6. Repeat with your right leg and your left arm. Repeat __________ times. Complete this exercise __________ times a day.   Posture and body mechanics Good posture and healthy body mechanics can help  to relieve stress in your body's tissues and joints. Body mechanics refers to the movements and positions of your body while you do your daily activities. Posture is part of body mechanics. Good posture means:  Your spine is in its natural S-curve position (neutral).  Your shoulders are pulled back slightly.  Your head is not tipped forward. Follow these guidelines to improve your posture and body mechanics in your everyday activities. Standing  When standing, keep your spine neutral and your feet about hip width apart. Keep a slight bend in your knees. Your ears, shoulders, and hips should line up.  When you do a task in which you stand in one place for a long time, place one foot up on a stable object that is 2-4 inches (5-10 cm) high, such as a footstool. This helps keep your spine neutral.   Sitting  When sitting, keep your spine neutral and keep your feet flat on the floor. Use a footrest, if necessary, and keep your thighs parallel to the floor. Avoid rounding your shoulders, and avoid tilting your head forward.  When working at a desk or a computer, keep your desk at a height where your hands are slightly lower than your elbows. Slide your chair under your desk so you are close enough to maintain good posture.  When working at a computer, place your monitor at a height where you are looking straight ahead and you do not have to tilt your head forward or downward to look at the screen.   Resting  When lying down and resting, avoid positions that are most painful for  you.  If you have pain with activities such as sitting, bending, stooping, or squatting, lie in a position in which your body does not bend very much. For example, avoid curling up on your side with your arms and knees near your chest (fetal position).  If you have pain with activities such as standing for a long time or reaching with your arms, lie with your spine in a neutral position and bend your knees slightly. Try the following positions: ? Lying on your side with a pillow between your knees. ? Lying on your back with a pillow under your knees. Lifting  When lifting objects, keep your feet at least shoulder width apart and tighten your abdominal muscles.  Bend your knees and hips and keep your spine neutral. It is important to lift using the strength of your legs, not your back. Do not lock your knees straight out.  Always ask for help to lift heavy or awkward objects.   This information is not intended to replace advice given to you by your health care provider. Make sure you discuss any questions you have with your health care provider. Document Revised: 08/20/2018 Document Reviewed: 05/20/2018 Elsevier Patient Education  [employer].

## 2020-09-25 ENCOUNTER — Telehealth: Payer: Self-pay

## 2020-09-25 DIAGNOSIS — Z0000 Encounter for general adult medical examination without abnormal findings: Secondary | ICD-10-CM

## 2020-09-25 LAB — CMP14+EGFR
ALT: 22 IU/L (ref 0–32)
AST: 17 IU/L (ref 0–40)
Albumin/Globulin Ratio: 1.9 (ref 1.2–2.2)
Albumin: 4.3 g/dL (ref 3.7–4.7)
Alkaline Phosphatase: 63 IU/L (ref 44–121)
BUN/Creatinine Ratio: 14 (ref 12–28)
BUN: 15 mg/dL (ref 8–27)
Bilirubin Total: 0.4 mg/dL (ref 0.0–1.2)
CO2: 24 mmol/L (ref 20–29)
Calcium: 9.6 mg/dL (ref 8.7–10.3)
Chloride: 105 mmol/L (ref 96–106)
Creatinine, Ser: 1.06 mg/dL — ABNORMAL HIGH (ref 0.57–1.00)
Globulin, Total: 2.3 g/dL (ref 1.5–4.5)
Glucose: 138 mg/dL — ABNORMAL HIGH (ref 65–99)
Potassium: 4.8 mmol/L (ref 3.5–5.2)
Sodium: 144 mmol/L (ref 134–144)
Total Protein: 6.6 g/dL (ref 6.0–8.5)
eGFR: 54 mL/min/{1.73_m2} — ABNORMAL LOW (ref >59)

## 2020-09-25 LAB — HEMOGLOBIN A1C
Est. average glucose Bld gHb Est-mCnc: 151 mg/dL
Hgb A1c MFr Bld: 6.9 % — ABNORMAL HIGH (ref 4.8–5.6)

## 2020-09-25 LAB — LIPID PANEL
Chol/HDL Ratio: 2.6 ratio (ref 0.0–4.4)
Cholesterol, Total: 144 mg/dL (ref 100–199)
HDL: 55 mg/dL (ref >39)
LDL Chol Calc (NIH): 79 mg/dL (ref 0–99)
Triglycerides: 44 mg/dL (ref 0–149)
VLDL Cholesterol Cal: 10 mg/dL (ref 5–40)

## 2020-09-25 NOTE — Telephone Encounter (Signed)
Patient called because has another doctor's appointment scheduled during the time as her health coaching appointment on 09/26/20 at 9:30am. Patient would like to reschedule appointment to Friday 09/28/20 for the same time. Patient has been rescheduled for 09/28/20 at 9:30am. Care Guide will call patient for telephonic health coaching session at that time.

## 2020-09-26 ENCOUNTER — Ambulatory Visit: Payer: Medicare PPO

## 2020-09-26 ENCOUNTER — Other Ambulatory Visit: Payer: Self-pay | Admitting: Cardiovascular Disease

## 2020-09-26 DIAGNOSIS — M9901 Segmental and somatic dysfunction of cervical region: Secondary | ICD-10-CM | POA: Diagnosis not present

## 2020-09-26 DIAGNOSIS — M5032 Other cervical disc degeneration, mid-cervical region, unspecified level: Secondary | ICD-10-CM | POA: Diagnosis not present

## 2020-09-26 DIAGNOSIS — S29012A Strain of muscle and tendon of back wall of thorax, initial encounter: Secondary | ICD-10-CM | POA: Diagnosis not present

## 2020-09-26 DIAGNOSIS — M9902 Segmental and somatic dysfunction of thoracic region: Secondary | ICD-10-CM | POA: Diagnosis not present

## 2020-09-26 DIAGNOSIS — M47817 Spondylosis without myelopathy or radiculopathy, lumbosacral region: Secondary | ICD-10-CM | POA: Diagnosis not present

## 2020-09-26 DIAGNOSIS — M9905 Segmental and somatic dysfunction of pelvic region: Secondary | ICD-10-CM | POA: Diagnosis not present

## 2020-09-26 DIAGNOSIS — M9903 Segmental and somatic dysfunction of lumbar region: Secondary | ICD-10-CM | POA: Diagnosis not present

## 2020-09-26 DIAGNOSIS — M5136 Other intervertebral disc degeneration, lumbar region: Secondary | ICD-10-CM | POA: Diagnosis not present

## 2020-09-28 ENCOUNTER — Ambulatory Visit: Payer: Medicare PPO

## 2020-09-28 ENCOUNTER — Other Ambulatory Visit: Payer: Self-pay | Admitting: Cardiovascular Disease

## 2020-09-28 ENCOUNTER — Telehealth: Payer: Self-pay

## 2020-09-28 DIAGNOSIS — S29012A Strain of muscle and tendon of back wall of thorax, initial encounter: Secondary | ICD-10-CM | POA: Diagnosis not present

## 2020-09-28 DIAGNOSIS — Z0000 Encounter for general adult medical examination without abnormal findings: Secondary | ICD-10-CM

## 2020-09-28 DIAGNOSIS — M9905 Segmental and somatic dysfunction of pelvic region: Secondary | ICD-10-CM | POA: Diagnosis not present

## 2020-09-28 DIAGNOSIS — M9903 Segmental and somatic dysfunction of lumbar region: Secondary | ICD-10-CM | POA: Diagnosis not present

## 2020-09-28 DIAGNOSIS — M9901 Segmental and somatic dysfunction of cervical region: Secondary | ICD-10-CM | POA: Diagnosis not present

## 2020-09-28 DIAGNOSIS — M5136 Other intervertebral disc degeneration, lumbar region: Secondary | ICD-10-CM | POA: Diagnosis not present

## 2020-09-28 DIAGNOSIS — M5032 Other cervical disc degeneration, mid-cervical region, unspecified level: Secondary | ICD-10-CM | POA: Diagnosis not present

## 2020-09-28 DIAGNOSIS — M47817 Spondylosis without myelopathy or radiculopathy, lumbosacral region: Secondary | ICD-10-CM | POA: Diagnosis not present

## 2020-09-28 DIAGNOSIS — M9902 Segmental and somatic dysfunction of thoracic region: Secondary | ICD-10-CM | POA: Diagnosis not present

## 2020-09-28 NOTE — Telephone Encounter (Signed)
Patient called in to reschedule appointment from today. Patient has been rescheduled for 10/02/20 at 9:30am for an in-person health coaching session.

## 2020-10-01 DIAGNOSIS — M5032 Other cervical disc degeneration, mid-cervical region, unspecified level: Secondary | ICD-10-CM | POA: Diagnosis not present

## 2020-10-01 DIAGNOSIS — M9905 Segmental and somatic dysfunction of pelvic region: Secondary | ICD-10-CM | POA: Diagnosis not present

## 2020-10-01 DIAGNOSIS — M47817 Spondylosis without myelopathy or radiculopathy, lumbosacral region: Secondary | ICD-10-CM | POA: Diagnosis not present

## 2020-10-01 DIAGNOSIS — M9903 Segmental and somatic dysfunction of lumbar region: Secondary | ICD-10-CM | POA: Diagnosis not present

## 2020-10-01 DIAGNOSIS — M5136 Other intervertebral disc degeneration, lumbar region: Secondary | ICD-10-CM | POA: Diagnosis not present

## 2020-10-01 DIAGNOSIS — M9901 Segmental and somatic dysfunction of cervical region: Secondary | ICD-10-CM | POA: Diagnosis not present

## 2020-10-01 DIAGNOSIS — S29012A Strain of muscle and tendon of back wall of thorax, initial encounter: Secondary | ICD-10-CM | POA: Diagnosis not present

## 2020-10-01 DIAGNOSIS — M9902 Segmental and somatic dysfunction of thoracic region: Secondary | ICD-10-CM | POA: Diagnosis not present

## 2020-10-02 ENCOUNTER — Ambulatory Visit: Payer: Medicare PPO

## 2020-10-02 ENCOUNTER — Telehealth: Payer: Self-pay

## 2020-10-02 DIAGNOSIS — Z0000 Encounter for general adult medical examination without abnormal findings: Secondary | ICD-10-CM

## 2020-10-02 NOTE — Telephone Encounter (Signed)
Called patient to reschedule health coaching session that she cancelled. Left patient a message to call Care Guide back to reschedule or to hold session over the phone at [phone].

## 2020-10-03 ENCOUNTER — Ambulatory Visit
Admission: RE | Admit: 2020-10-03 | Discharge: 2020-10-03 | Disposition: A | Discharge status: Home / Self Care | Payer: Medicare PPO | Source: Ambulatory Visit | Attending: Internal Medicine | Admitting: Internal Medicine

## 2020-10-03 ENCOUNTER — Other Ambulatory Visit: Payer: Self-pay

## 2020-10-03 DIAGNOSIS — M9905 Segmental and somatic dysfunction of pelvic region: Secondary | ICD-10-CM | POA: Diagnosis not present

## 2020-10-03 DIAGNOSIS — M9903 Segmental and somatic dysfunction of lumbar region: Secondary | ICD-10-CM | POA: Diagnosis not present

## 2020-10-03 DIAGNOSIS — M9902 Segmental and somatic dysfunction of thoracic region: Secondary | ICD-10-CM | POA: Diagnosis not present

## 2020-10-03 DIAGNOSIS — S29012A Strain of muscle and tendon of back wall of thorax, initial encounter: Secondary | ICD-10-CM | POA: Diagnosis not present

## 2020-10-03 DIAGNOSIS — M5136 Other intervertebral disc degeneration, lumbar region: Secondary | ICD-10-CM | POA: Diagnosis not present

## 2020-10-03 DIAGNOSIS — M5032 Other cervical disc degeneration, mid-cervical region, unspecified level: Secondary | ICD-10-CM | POA: Diagnosis not present

## 2020-10-03 DIAGNOSIS — E2839 Other primary ovarian failure: Secondary | ICD-10-CM

## 2020-10-03 DIAGNOSIS — Z780 Asymptomatic menopausal state: Secondary | ICD-10-CM | POA: Diagnosis not present

## 2020-10-03 DIAGNOSIS — M47817 Spondylosis without myelopathy or radiculopathy, lumbosacral region: Secondary | ICD-10-CM | POA: Diagnosis not present

## 2020-10-03 DIAGNOSIS — M9901 Segmental and somatic dysfunction of cervical region: Secondary | ICD-10-CM | POA: Diagnosis not present

## 2020-10-04 ENCOUNTER — Ambulatory Visit (INDEPENDENT_AMBULATORY_CARE_PROVIDER_SITE_OTHER): Payer: Medicare PPO

## 2020-10-04 DIAGNOSIS — Z0000 Encounter for general adult medical examination without abnormal findings: Secondary | ICD-10-CM

## 2020-10-04 NOTE — Progress Notes (Signed)
Appointment Outcome:  Completed, Session #: 3  Start time: 10:37am   End time: 11:20am   Total Mins: 43 minutes  AGREEMENTS SECTION   Overall Goal(s): Stress management Reduce sodium consumption                                             Agreement/Action Steps:    Drink tea around 9am   Eat breakfast 10:30am -11:00am   Walking at [place] 11:30am -12:30pm (Mon, Wed, Fri of each week)   Eat lunch by 3pm   Eat dinner at [place] labels to monitor sodium intake   Utilize support system   Write in gratitude journal daily (3 things grateful for and/or 3 things was successful at doing) - Set an alarm for 5:30pm to start writing for 10-15 minutes   Conduct self-check-ins  Utilize positive self-talk to encourage yourself  Find a hobby   Progress Notes:  Patient has spent the past two weeks visiting her son and another family member. Patient stated that she has learned that she gets too involved with her children that she forgets about or put off things for herself. Patient stated that she followed their schedule more so than the one she has set for herself. Patient also shared that sometimes her day started late depending on if she had an appointment or was going out with one of her family members. However, patient does not feel guilty as she would before because she did try to stick to her steps as close as possible. Patient also stated that she now understands the need to be flexible and it's taking some time to get used to being flexible because she is used to doing things in her working years a certain way.   Patient stated that during this time, she did become overwhelmed and didn't remember to conduct self-check-ins and utilize self-talk during this time to calm down.   Patient shared that she has walked in [place] one day and walked at the [employer] days one week.   Patient mentioned that she wrote in her journal twice. Patient has not set an alarm for 5:30pm  on her phone to remind her to write daily. Patient stated that she typically tells Alexa to remind her at times.   Patient does use self-check-ins to help make decisions about what to eat. Patient stated that she also utilizes positive self-talk to encourage herself not to eat things that she may herself wanting. Patient shared an example that she told herself that she cannot eat a hamburger but to get a salad while out or what to eat a better meal later. Patient stated that she is reading food labels, which is helping her make better food choices.   Patient stated that although she was not able to follow her schedule precisely, but she took notes as reminders of steps. Sometimes when she missed a step during the day, she would still be able to mentally process what she was supposed to do earlier. Patient stated now that she is back home, she can focus on herself and engage more with her steps. Patient mentioned that it's easier now because she does not have to think twice about what she is supposed to be doing at a given time since she only must concentrate on herself verses when she is with her children. Patient  shared that she does have some concentration issues that make it hard to remember some things and she tends to write things down for remembrance. Patient is also taking medication for that.   Patient continues to be supported by her sons and daughter-in-law.  Patient is still looking for a hobby to engage in.    Indicators of Success and Accountability:  Patient has been able to start becoming flexible in implementing her schedule.  Readiness: Patient is in the action phase of stress management and reducing sodium consumption.   Strengths and Supports: Patient is being supported by her sons and daughter-in-law. Patient has been mindful of steps to ensure she implement them accordingly.   Challenges and Barriers: When patient is not home, this throws off her schedule.  Coaching  Outcomes: Patient stated that she is learning to take time for self even though she may be with her family. Patient stated that she is remembering not to stop and give up but to keep going. Patient stated that she is learning how to be more flexible and see that she was having a hard time with her steps initially because she was used to a certain pattern.  Patient will continue to work on action steps as outlined above over the next two weeks.   Care Guide assisted patient with setting alarm on her cell phone as a backup reminder of her time to write in her journal at 5:30pm daily.   Care Guide provided patient a [facility] and Recreation booklet with various opportunities to engage in various activities in the area.   Care Guide emailed patient a copy of her action steps for review. Patient was also mailed a copy of the steps in addition to information on how to read food labels.   Attempted:  Fulfilled - Patient is being flexible with eating schedule to ensure that she maintain her routine, patient reads food labels, and utilized support system.   Partial - Patient wrote in her journal twice, walked a total of three times in the park or Rec center, and patient conducted self-check-ins and utilize positive self-talk at times to aid in reducing sodium consumption, but not stress management.  Not met - Patient had not set a reminder for writing daily at 5:30pm in her phone yet. Patient has not identified a hobby yet.

## 2020-10-09 DIAGNOSIS — M9903 Segmental and somatic dysfunction of lumbar region: Secondary | ICD-10-CM | POA: Diagnosis not present

## 2020-10-09 DIAGNOSIS — M9901 Segmental and somatic dysfunction of cervical region: Secondary | ICD-10-CM | POA: Diagnosis not present

## 2020-10-09 DIAGNOSIS — M47817 Spondylosis without myelopathy or radiculopathy, lumbosacral region: Secondary | ICD-10-CM | POA: Diagnosis not present

## 2020-10-09 DIAGNOSIS — M9905 Segmental and somatic dysfunction of pelvic region: Secondary | ICD-10-CM | POA: Diagnosis not present

## 2020-10-09 DIAGNOSIS — M5136 Other intervertebral disc degeneration, lumbar region: Secondary | ICD-10-CM | POA: Diagnosis not present

## 2020-10-09 DIAGNOSIS — M5032 Other cervical disc degeneration, mid-cervical region, unspecified level: Secondary | ICD-10-CM | POA: Diagnosis not present

## 2020-10-09 DIAGNOSIS — M9902 Segmental and somatic dysfunction of thoracic region: Secondary | ICD-10-CM | POA: Diagnosis not present

## 2020-10-09 DIAGNOSIS — S29012A Strain of muscle and tendon of back wall of thorax, initial encounter: Secondary | ICD-10-CM | POA: Diagnosis not present

## 2020-10-11 DIAGNOSIS — M9902 Segmental and somatic dysfunction of thoracic region: Secondary | ICD-10-CM | POA: Diagnosis not present

## 2020-10-11 DIAGNOSIS — S29012A Strain of muscle and tendon of back wall of thorax, initial encounter: Secondary | ICD-10-CM | POA: Diagnosis not present

## 2020-10-11 DIAGNOSIS — M9901 Segmental and somatic dysfunction of cervical region: Secondary | ICD-10-CM | POA: Diagnosis not present

## 2020-10-11 DIAGNOSIS — M47817 Spondylosis without myelopathy or radiculopathy, lumbosacral region: Secondary | ICD-10-CM | POA: Diagnosis not present

## 2020-10-11 DIAGNOSIS — M9903 Segmental and somatic dysfunction of lumbar region: Secondary | ICD-10-CM | POA: Diagnosis not present

## 2020-10-11 DIAGNOSIS — M5032 Other cervical disc degeneration, mid-cervical region, unspecified level: Secondary | ICD-10-CM | POA: Diagnosis not present

## 2020-10-11 DIAGNOSIS — M9905 Segmental and somatic dysfunction of pelvic region: Secondary | ICD-10-CM | POA: Diagnosis not present

## 2020-10-11 DIAGNOSIS — M5136 Other intervertebral disc degeneration, lumbar region: Secondary | ICD-10-CM | POA: Diagnosis not present

## 2020-10-12 ENCOUNTER — Other Ambulatory Visit: Payer: Self-pay

## 2020-10-12 ENCOUNTER — Telehealth: Payer: Self-pay | Admitting: Cardiovascular Disease

## 2020-10-12 ENCOUNTER — Encounter: Payer: Self-pay | Admitting: Cardiovascular Disease

## 2020-10-12 ENCOUNTER — Ambulatory Visit (INDEPENDENT_AMBULATORY_CARE_PROVIDER_SITE_OTHER): Payer: Medicare PPO | Admitting: Cardiovascular Disease

## 2020-10-12 DIAGNOSIS — K219 Gastro-esophageal reflux disease without esophagitis: Secondary | ICD-10-CM | POA: Diagnosis not present

## 2020-10-12 DIAGNOSIS — E785 Hyperlipidemia, unspecified: Secondary | ICD-10-CM | POA: Diagnosis not present

## 2020-10-12 DIAGNOSIS — E669 Obesity, unspecified: Secondary | ICD-10-CM | POA: Diagnosis not present

## 2020-10-12 DIAGNOSIS — E1169 Type 2 diabetes mellitus with other specified complication: Secondary | ICD-10-CM | POA: Diagnosis not present

## 2020-10-12 DIAGNOSIS — I10 Essential (primary) hypertension: Secondary | ICD-10-CM | POA: Diagnosis not present

## 2020-10-12 DIAGNOSIS — L659 Nonscarring hair loss, unspecified: Secondary | ICD-10-CM | POA: Diagnosis not present

## 2020-10-12 HISTORY — DX: Gastro-esophageal reflux disease without esophagitis: K21.9

## 2020-10-12 HISTORY — DX: Nonscarring hair loss, unspecified: L65.9

## 2020-10-12 LAB — TSH: TSH: 1.91 u[IU]/mL (ref 0.450–4.500)

## 2020-10-12 NOTE — Assessment & Plan Note (Signed)
She is on metformin.  On an ARB.  Encouraged her to keep up the exercise and diet.  A1c 6.9% on 09/2020.

## 2020-10-12 NOTE — Telephone Encounter (Signed)
Patient is requesting to speak with Dr. Blenda Mounts nurse. She states Dr. Oval Linsey is referring her to the [facility] program. However, after her appointment today she reviewed her after visit summary and didn't see it mentioned. She wants to confirm that she is still being referred to them.

## 2020-10-12 NOTE — Assessment & Plan Note (Signed)
She has noted significant hair loss in the crown of her head over the last year.  We will refer her to dermatology and check a TSH today.

## 2020-10-12 NOTE — Progress Notes (Signed)
Cardiology Office Note   Date:  10/12/2020   ID:  Jasmine, Lopez 02-14-1941, MRN [id number]  PCP:  [name], FNP  Cardiologist:   Skeet Latch, MD   No chief complaint on file.   History of Present Illness: Jasmine Lopez is a 80 y.o. female with hypertension, breast cancer, and diabetes here for follow up.  She was initially seen 10/2017 for heart murmur.  Jasmine Lopez recently moved to [place] from [place].  Her husband passed 02/2017 then her mother passed 06/2017.  She moved to be closer to her sons.  She established care with her PCP and was noted to have a murmur.  Jasmine Lopez was seeing a cardiologist in [place] due to poorly controlled hypertension.  She had a stress test around 2017 that was reportedly negative for ischemia.  She was started on metoprolol which helped her to feel less short of breath.  She was referred for an echocardiogram 11/17/2017 that revealed LVEF 65 to 70% and no LVH or intracavitary gradient.  She had no valvular heart disease. Her BP was above goal so amlodipine was added.  She followed up with Kerin Ransom on 06/2018 and her BP was still elevated.  She had a hard time cutting chlorthalidone in half so it was discontinued.  Metoprolol was switched to carvedilol due to bradycardia.  Her BP was a little elevated 02/2019, but this was 2/2 steroid use and she was only taking carvedilol once daily.  She struggled with cough and has been diagnosed with vocal cord dysfunction.  She gets rehab and has GERD that is being treated.  This has helped her cough.  She has noted significant improvement in the symptoms.  She occasionally has heartburn symptoms that occur when she eats a heavy meal and lays down.  Otherwise it is well-controlled.  She has no heartburn with exertion.  [name]'s blood pressure was poorly controlled.  Amlodipine was increased to 10 mg.  Blood pressure was 140/68 when she followed up with our pharmacist.  Losartan was switched to  valsartan.  Since then her blood pressure has been well controlled.  She was also referred to the PREP program at the [facility].  She wants to participate but feels uncomfortable driving around town.  She had some word finding difficulty and was concerned about Alzheimer's.  She was referred to neurology and had an MRI of the brain that was unremarkable, showing only mild age-appropriate changes of chronic small vessel disease and supratentorial cortical atrophy.  She was started back on Wellbutrin and feels that her symptoms have improved significantly.  She is able to think her thoughts through to completion and her mood has been much better. She has been working with our Engineer, maintenance for stress management.  Today, she is feeling more comfortable with her health and stress management. At home her blood pressure has been stable and well-controlled, averaging in the 130s. If her blood pressure is in the 140s she knows something is not right. Her heart rate is usually 60-70 on average. However, she is still not as active as she wants to be but continues to work on this. She does walk regularly mostly at the nearby Center. Typically she walks about 1.5-2 miles, and she feels well with no exertional issues.  Her goal is to be will to walk 5 miles.  She notes she will do better with a schedule. There is still some LE edema, but this improves with elevation. For her  diet, she also continues to work on drinking more water and cutting back on large portions. She no longer eats late at night and is not having issues with coughing. Since her last visit she has lost some weight. Also, since last December she has noticed her hair is thinning, and is unsure if this is related to her thyroid. She denies any chest pain, shortness of breath, or palpitations. No headaches, lightheadedness, or syncope to report. Also has no orthopnea or PND.     Past Medical History:  Diagnosis Date  . Anxiety   . Breast cancer ([place])   . Cancer  ([place])   . Diabetes mellitus without complication ([place])   . Dysphagia   . GERD (gastroesophageal reflux disease) 10/12/2020  . Hair loss 10/12/2020  . Hx of adenomatous polyp of colon 06[facility]  . Hypertension   . Irregular heart beat   . Sleep apnea     Past Surgical History:  Procedure Laterality Date  . BREAST EXCISIONAL BIOPSY Bilateral   . BREAST LUMPECTOMY Left 2016     Current Outpatient Medications  Medication Sig Dispense Refill  . Accu-Chek Softclix Lancets lancets Use to check blood sugars twice daily E11.69 100 each 2  . acetaminophen (TYLENOL) 500 MG tablet Take 500 mg by mouth every 6 (six) hours as needed.    Marland Kitchen allopurinol (ZYLOPRIM) 100 MG tablet TAKE 1 TABLET BY MOUTH EVERY DAY 90 tablet 0  . amLODipine (NORVASC) 10 MG tablet Take 1 tablet (10 mg total) by mouth daily. 90 tablet 2  . Biotin 10000 MCG TBDP 1 tablet daily as needed.    . blood glucose meter kit and supplies KIT Dispense based on patient and insurance preference. Use up to two times daily as directed. (FOR ICD-9 250.00, 250.01). 1 each 0  . buPROPion (WELLBUTRIN XL) 150 MG 24 hr tablet TAKE 1 TABLET(150 MG) BY MOUTH EVERY MORNING 90 tablet 1  . carvedilol (COREG) 6.25 MG tablet Take 1 tablet (6.25 mg total) by mouth 2 (two) times daily with a meal. 180 tablet 2  . Cholecalciferol (VITAMIN D3) 2000 units TABS Take by mouth.    . Garlic 8657 MG CAPS Take 1 capsule by mouth daily.    Marland Kitchen glucose blood (ACCU-CHEK GUIDE) test strip Check blood sugars twice daily E11.69 100 each 2  . glucose blood (ACCU-CHEK SMARTVIEW) test strip Use to check blood sugars twice daily E11.69 100 each 2  . metFORMIN (GLUCOPHAGE-XR) 500 MG 24 hr tablet TAKE 1 TABLET BY MOUTH TWICE A DAY 180 tablet 1  . OneTouch Delica Lancets 84O MISC TEST FASTING BG AND 2 HOURS AFTER MEALS 100 each 3  . rosuvastatin (CRESTOR) 20 MG tablet TAKE 1 TABLET(20 MG) BY MOUTH DAILY 90 tablet 3  . valsartan-hydrochlorothiazide  (DIOVAN-HCT) 160-25 MG tablet Take 1 tablet by mouth daily. 90 tablet 1   No current facility-administered medications for this visit.    Allergies:   Ace inhibitors, Atorvastatin, Benadryl [diphenhydramine], and Peppermint flavor [flavoring agent]    Social History:  The patient  reports that she has never smoked. She has never used smokeless tobacco. She reports that she does not drink alcohol and does not use drugs.   Family History:  The patient's family history includes COPD in her mother; Cancer in her father; Colon cancer in her maternal grandmother; Heart attack in her maternal grandfather; Heart failure in her mother; Hypertension in her mother; Rectal cancer in her maternal  grandmother.    ROS:   Please see the history of present illness. (+) LE edema (+) Stress (+) Thinning hair All other systems are reviewed and negative.    PHYSICAL EXAM: VS:  BP 138/72 (BP Location: Left Arm, Patient Position: Sitting, Cuff Size: Normal)   Pulse (!) 58   Resp 20   Ht _0  (1.6 m)   Wt 186 lb 9.6 oz (84.6 kg)   SpO2 100%   BMI 33.05 kg/m  , BMI Body mass index is 33.05 kg/m. GENERAL:  Well appearing HEENT: Pupils equal round and reactive, fundi not visualized, oral mucosa unremarkable NECK:  No jugular venous distention, waveform within normal limits, carotid upstroke brisk and symmetric, no bruits LUNGS:  Clear to auscultation bilaterally HEART:  RRR.  PMI not displaced or sustained,S1 and S2 within normal limits, no S3, no S4, no clicks, no rubs, II/VI systolic murmur at the LUSB ABD:  Flat, positive bowel sounds normal in frequency in pitch, no bruits, no rebound, no guarding, no midline pulsatile mass, no hepatomegaly, no splenomegaly EXT:  2 plus pulses throughout, no edema, no cyanosis no clubbing SKIN:  No rashes no nodules NEURO:  Cranial nerves II through XII grossly intact, motor grossly intact throughout PSYCH:  Cognitively intact, oriented to person place and  time   EKG:   02/09/18: Sinus bradycardia.  Rate 54 bpm.  Nonspecific T wave abnormalities. 04/05/19: Sinus bradycardia.  Rate 58 bpm.  Nonspecific T wave abnormalities 08/04/2019: EKG was not ordered. 10/12/2020: Sinus bradycardia. Rate 58 bpm, 1st degree AV block, low voltage.   Echo 11/17/17: Study Conclusions  - Left ventricle: The cavity size was normal. Systolic function was   hyperdynamic. The estimated ejection fraction was in the range of   70% to 75%. Wall motion was normal; there were no regional wall   motion abnormalities. Features are consistent with a pseudonormal   left ventricular filling pattern, with concomitant abnormal   relaxation and increased filling pressure (grade 2 diastolic   dysfunction). - Mitral valve: Valve area by pressure half-time: 2.22 cm^2. - Left atrium: The atrium was moderately dilated. - Pulmonary arteries: PA peak pressure: 31 mm Hg (S).  Recent Labs: 10/13/2019: Hemoglobin 13.6; Platelets 224; TSH 1.220 09/24/2020: ALT 22; BUN 15; Creatinine, Ser 1.06; Potassium 4.8; Sodium 144   10/15/2017: TSH 2.06, free T4 1.09 Hemoglobin A1c 6.6% Sodium 141, potassium 3.9, BUN 11, creatinine 0.83  01/21/2018: Total cholesterol 198, triglycerides 101, HDL 55, LDL 123  Lipid Panel    Component Value Date/Time   CHOL 144 09/24/2020 0923   TRIG 44 09/24/2020 0923   HDL 55 09/24/2020 0923   CHOLHDL 2.6 09/24/2020 0923   LDLCALC 79 09/24/2020 0923      Wt Readings from Last 3 Encounters:  10/12/20 186 lb 9.6 oz (84.6 kg)  09/24/20 184 lb 6.4 oz (83.6 kg)  08/28/20 187 lb (84.8 kg)      ASSESSMENT AND PLAN: GERD (gastroesophageal reflux disease) Improved since she stopped eating late at night.  Essential hypertension BP better controlled in the 130s but not quite at goal.  She wants to keep working on diet and exercise.  Continue valsartan, HCTZ, carvedilol and amlodipine.    Type 2 diabetes mellitus with hyperlipidemia ([place]) She is on  metformin.  On an ARB.  Encouraged her to keep up the exercise and diet.  A1c 6.9% on 09/2020.  Obesity (BMI 30.0-34.9) Referral to the PREP program to the [facility].  Continue working on  diet and exercise.  Hair loss She has noted significant hair loss in the crown of her head over the last year.  We will refer her to dermatology and check a TSH today.   Current medicines are reviewed at length with the patient today.  The patient does not have concerns regarding medicines.  The following changes have been made: Increase amlodipine and resume atorvastatin  Labs/ tests ordered today include:   Orders Placed This Encounter  Procedures  . TSH  . Ambulatory referral to Dermatology  . EKG 12-Lead     Disposition:   FU with Lavora Brisbon C. Oval Linsey, MD, [facility] in 6 months.   I,Mathew Stumpf,acting as a Education administrator for Skeet Latch, MD.,have documented all relevant documentation on the behalf of Skeet Latch, MD,as directed by  Skeet Latch, MD while in the presence of Skeet Latch, MD.  I, [place] Oval Linsey, MD have reviewed all documentation for this visit.  The documentation of the exam, diagnosis, procedures, and orders on 10/12/2020 are all accurate and complete.   Signed, Lataya Varnell C. Oval Linsey, MD, [facility]  10/12/2020 9:30 AM    [facility]

## 2020-10-12 NOTE — Assessment & Plan Note (Signed)
Improved since she stopped eating late at night.

## 2020-10-12 NOTE — Assessment & Plan Note (Signed)
BP better controlled in the 130s but not quite at goal.  She wants to keep working on diet and exercise.  Continue valsartan, HCTZ, carvedilol and amlodipine.

## 2020-10-12 NOTE — Patient Instructions (Signed)
Medication Instructions:  Your physician recommends that you continue on your current medications as directed. Please refer to the Current Medication list given to you today.   *If you need a refill on your cardiac medications before your next appointment, please call your pharmacy*  Lab Work: TSH TODAY   If you have labs (blood work) drawn today and your tests are completely normal, you will receive your results only by: Marland Kitchen MyChart Message (if you have MyChart) OR . A paper copy in the mail If you have any lab test that is abnormal or we need to change your treatment, we will call you to review the results.  Testing/Procedures: NONE   Follow-Up: At [facility], you and your health needs are our priority.  As part of our continuing mission to provide you with exceptional heart care, we have created designated Provider Care Teams.  These Care Teams include your primary Cardiologist (physician) and Advanced Practice Providers (APPs -  Physician Assistants and Nurse Practitioners) who all work together to provide you with the care you need, when you need it.  We recommend signing up for the patient portal called "MyChart".  Sign up information is provided on this After Visit Summary.  MyChart is used to connect with patients for Virtual Visits (Telemedicine).  Patients are able to view lab/test results, encounter notes, upcoming appointments, etc.  Non-urgent messages can be sent to your provider as well.   To learn more about what you can do with MyChart, go to [email or web address].    Your next appointment:   6 month(s)  The format for your next appointment:   In Person  Provider:   DR Clayton NP/PA AT [place] have been referred to DERMATOLOGY  IF YOU DO NOT HEAR FROM OFFICE IN 2 WEEKS CALL TO FOLLOW UP   Other Instructions  SOMEONE FROM THE PREP TEAM ([place]) WILL BE IN [place]

## 2020-10-12 NOTE — Telephone Encounter (Signed)
The patient has been made aware that the referral has been placed. This was mentioned on the second page of the AVS.

## 2020-10-12 NOTE — Assessment & Plan Note (Signed)
Referral to the PREP program to the [facility].  Continue working on diet and exercise.

## 2020-10-16 DIAGNOSIS — M9902 Segmental and somatic dysfunction of thoracic region: Secondary | ICD-10-CM | POA: Diagnosis not present

## 2020-10-16 DIAGNOSIS — S29012A Strain of muscle and tendon of back wall of thorax, initial encounter: Secondary | ICD-10-CM | POA: Diagnosis not present

## 2020-10-16 DIAGNOSIS — M9901 Segmental and somatic dysfunction of cervical region: Secondary | ICD-10-CM | POA: Diagnosis not present

## 2020-10-16 DIAGNOSIS — M9903 Segmental and somatic dysfunction of lumbar region: Secondary | ICD-10-CM | POA: Diagnosis not present

## 2020-10-16 DIAGNOSIS — M9905 Segmental and somatic dysfunction of pelvic region: Secondary | ICD-10-CM | POA: Diagnosis not present

## 2020-10-16 DIAGNOSIS — M5136 Other intervertebral disc degeneration, lumbar region: Secondary | ICD-10-CM | POA: Diagnosis not present

## 2020-10-16 DIAGNOSIS — M47817 Spondylosis without myelopathy or radiculopathy, lumbosacral region: Secondary | ICD-10-CM | POA: Diagnosis not present

## 2020-10-16 DIAGNOSIS — M5032 Other cervical disc degeneration, mid-cervical region, unspecified level: Secondary | ICD-10-CM | POA: Diagnosis not present

## 2020-10-17 ENCOUNTER — Telehealth: Payer: Self-pay | Admitting: Gastroenterology

## 2020-10-17 NOTE — Telephone Encounter (Signed)
Hi Dr. Havery Moros, this patient just called to cancel procedure that was scheduled on 10/24/20 because her care partner will be out of town next week. Patient has rescheduled to 01/10/21. Thank you.

## 2020-10-17 NOTE — Telephone Encounter (Signed)
Okay I appreciate you letting me know.  Thanks

## 2020-10-18 ENCOUNTER — Ambulatory Visit (INDEPENDENT_AMBULATORY_CARE_PROVIDER_SITE_OTHER): Payer: Medicare PPO

## 2020-10-18 ENCOUNTER — Other Ambulatory Visit: Payer: Self-pay

## 2020-10-18 DIAGNOSIS — M9905 Segmental and somatic dysfunction of pelvic region: Secondary | ICD-10-CM | POA: Diagnosis not present

## 2020-10-18 DIAGNOSIS — M5136 Other intervertebral disc degeneration, lumbar region: Secondary | ICD-10-CM | POA: Diagnosis not present

## 2020-10-18 DIAGNOSIS — M9903 Segmental and somatic dysfunction of lumbar region: Secondary | ICD-10-CM | POA: Diagnosis not present

## 2020-10-18 DIAGNOSIS — M9902 Segmental and somatic dysfunction of thoracic region: Secondary | ICD-10-CM | POA: Diagnosis not present

## 2020-10-18 DIAGNOSIS — M5032 Other cervical disc degeneration, mid-cervical region, unspecified level: Secondary | ICD-10-CM | POA: Diagnosis not present

## 2020-10-18 DIAGNOSIS — Z0000 Encounter for general adult medical examination without abnormal findings: Secondary | ICD-10-CM

## 2020-10-18 DIAGNOSIS — M47817 Spondylosis without myelopathy or radiculopathy, lumbosacral region: Secondary | ICD-10-CM | POA: Diagnosis not present

## 2020-10-18 DIAGNOSIS — S29012A Strain of muscle and tendon of back wall of thorax, initial encounter: Secondary | ICD-10-CM | POA: Diagnosis not present

## 2020-10-18 DIAGNOSIS — M9901 Segmental and somatic dysfunction of cervical region: Secondary | ICD-10-CM | POA: Diagnosis not present

## 2020-10-18 NOTE — Progress Notes (Signed)
Appointment Outcome:  Completed, Session #: 4  Start time: 10:33am   End time: 11:09am   Total Mins: 36 minutes  AGREEMENTS SECTION   Overall Goal(s): Stress management Reduce sodium consumption                                             Agreement/Action Steps:   Reduce Sodium Consumption Read food labels to monitor sodium intake  Drink tea around 9am  Eat breakfast 10:30am -11:00am  Eat lunch by 3pm  Eat dinner at 7pm   Stress Management Walking at [place] 11:30am -12:30pm (Mon, Wed, Fri of each week)  Utilize support system  Write in gratitude journal daily (3 things grateful for and/or 3 things was successful at  doing) -  Set an alarm for 5:30pm to start writing for 10-15 minutes  Conduct self-check-ins Utilize positive self-talk to encourage yourself Find a hobby  Progress Notes:  Patient stated that she has been able to follow her eating schedule. She shared that she was flexible within the time frames when visiting her children so that she could remain on track with her schedule. Patient stated that she has been reading food labels and it has helped her make better food choices while grocery shopping even though she likes to purchase sale items. Patient stated that she realizes that although foods may be on sale does not mean they are healthy for her. Patient stated that she uses to pick up whatever when grocery shopping, but now she takes the time to stand and read the labels. Patient stated that she even cleaned out her freezer and avoided buying anything that would be tempting that isn't healthy to eat. Patient stated that she has cut back on carbs (bread), salt, and sugar. Patient shared that she has tried to eat something salty or sweet, it is overpowering, and she can't eat it.   Patient stated that she didn't like the 5:30pm journal alarm at first because she forgot what it was for. Patient stated that now she is expecting it, she prepares herself around 5pm by  starting to think what she is grateful for or what she needs to work on. Patient stated that she has written in her journal approximately 4 days each week and is working up to 7 days. Patient was encouraged to get a smaller journal that she can carry in her purse so she can write when she is on the go, and patient took the advice and said that helped her to increase her witting. Patient stated that when she is around others and her alarm goes off, she informs them that it is her journal time. 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   Patient reported that she was back and forth between visiting her children and helping at her son's house that it interfered with her exercise schedule. Patient stated that during week one, she went to the [place] once, and twice during week 2. Patient mentioned that she had intended on going more days this week to make up for the days that she had missed previously, but it did not work out as planned, therefore, she will resume her normal schedule. Patient shared that she has identified a few activities that she would like to engage in as  hobbies (Tai Chi and Yoga on the [place]) at the [place] and [place]. Patient expressed that she discussed a potential computer workshop at the center with the receptionist. Patient stated that these activities will give her an opportunity to interact with others and help her improve her over health and refresh her computer skills. Patient mentioned that Winifred Olive contacted her regarding PREP and is set to for an appointment on June 15th @ 1pm at the [facility].  Patient stated that she uses to find an excuse not to do something or why she did not do something but will not do that anymore. Patient stated that she is using the check-ins and positive self-talk to encourage herself to do what she needs to do, and it is helping  her not to procrastinate. Patient mentioned that she is trying to stay on track with everything regardless of wherever she's at. Patient also shared that she is starting to open and talk about her health with her children and what she is doing to work towards health goals. Patient expressed that her children have been very supportive and is assisting her in making healthier choices.   Indicators of Success and Accountability:  Patient stated that following through with her steps and not procrastinating like before are her indicators of success and accountability. Readiness: Patient is in the action phase of stress management and reducing sodium consumption. Strengths and Supports: Patient is being supported by her children and daughter-in-law. Patient stated that her willpower is her strength to starting the routine of implementing her steps to achieve her goals.  Challenges and Barriers: Patient may be challenged to implementing her steps when she is away from home and out of routine.  Coaching Outcomes: Patient stated that she is invested in achieving her goals. She stated that she uses to stay in the past thinking about how she would have done things previously but is moving forward and is focused on where she is going. Patient mindset is shifting to prioritizing herself, being flexible, and reducing procrastination, which has helped to reduce stress.  Patient will attend her initial visit with PREP on June 15th at the [facility].   Patient will continue to follow steps as outlined above. Additionally, patient wants to start tracking the sodium she consumes at each meal so she can get a total for the day, alongside reading individual food labels to make healthier choices.   Care Guide emailed patient the American Heart Association Sodium Tracker sheet. Patient will try to track her sodium consumption for breakfast, lunch, dinner, snacks, and beverages for the next two weeks.   Patient also  expressed the need to increase water intake and will begin working to add back 20 oz of plain water daily over the next two weeks.   Attempted: Fulfilled - Patient was able to identify hobbies that she plans to engage in. Patient is reading food labels, utilizing positive self-talk, and conducting self-check-ins, utilizing support system, and following meal scheduled as outlined. Partial - Patient wrote in journal approximately 4 days per week each week when alarm on phone sounded at 5:30pm daily. Patient walked in [facility] once during week one and twice during week two.

## 2020-10-23 ENCOUNTER — Telehealth: Payer: Self-pay | Admitting: Neurology

## 2020-10-23 DIAGNOSIS — S29012A Strain of muscle and tendon of back wall of thorax, initial encounter: Secondary | ICD-10-CM | POA: Diagnosis not present

## 2020-10-23 DIAGNOSIS — M9903 Segmental and somatic dysfunction of lumbar region: Secondary | ICD-10-CM | POA: Diagnosis not present

## 2020-10-23 DIAGNOSIS — M9902 Segmental and somatic dysfunction of thoracic region: Secondary | ICD-10-CM | POA: Diagnosis not present

## 2020-10-23 DIAGNOSIS — M5136 Other intervertebral disc degeneration, lumbar region: Secondary | ICD-10-CM | POA: Diagnosis not present

## 2020-10-23 DIAGNOSIS — M9901 Segmental and somatic dysfunction of cervical region: Secondary | ICD-10-CM | POA: Diagnosis not present

## 2020-10-23 DIAGNOSIS — M9905 Segmental and somatic dysfunction of pelvic region: Secondary | ICD-10-CM | POA: Diagnosis not present

## 2020-10-23 DIAGNOSIS — M5032 Other cervical disc degeneration, mid-cervical region, unspecified level: Secondary | ICD-10-CM | POA: Diagnosis not present

## 2020-10-23 DIAGNOSIS — M47817 Spondylosis without myelopathy or radiculopathy, lumbosacral region: Secondary | ICD-10-CM | POA: Diagnosis not present

## 2020-10-23 NOTE — Telephone Encounter (Signed)
Pt called, have 2 appts scheduled 11/05/20 (virtual visit) and 11/06/20 (office visit). Which one should I keep? Would like a call from the nurs.

## 2020-10-23 NOTE — Telephone Encounter (Signed)
I called patient.  She wanted to keep 6/28 June at 215 arrive [phone].  (I relayed better for memory pts to come in office.

## 2020-10-24 ENCOUNTER — Other Ambulatory Visit: Payer: Self-pay

## 2020-10-24 ENCOUNTER — Encounter: Payer: Medicare PPO | Admitting: Nurse Practitioner

## 2020-10-24 ENCOUNTER — Ambulatory Visit (INDEPENDENT_AMBULATORY_CARE_PROVIDER_SITE_OTHER): Payer: Medicare PPO

## 2020-10-24 ENCOUNTER — Encounter: Payer: Medicare PPO | Admitting: Gastroenterology

## 2020-10-24 VITALS — BP 130/64 | HR 80 | Temp 98.5°F | Ht 60.3 in | Wt 184.4 lb

## 2020-10-24 DIAGNOSIS — Z0000 Encounter for general adult medical examination without abnormal findings: Secondary | ICD-10-CM

## 2020-10-24 NOTE — Progress Notes (Signed)
This visit occurred during the SARS-CoV-2 public health emergency.  Safety protocols were in place, including screening questions prior to the visit, additional usage of staff PPE, and extensive cleaning of exam room while observing appropriate contact time as indicated for disinfecting solutions.  Subjective:   Jasmine Lopez is a 80 y.o. female who presents for Medicare Annual (Subsequent) preventive examination.  Review of Systems     Cardiac Risk Factors include: advanced age (>39mn, >>71women);diabetes mellitus;dyslipidemia;hypertension;obesity (BMI >30kg/m2)     Objective:    Today's Vitals   10/24/20 1503  BP: 130/64  Pulse: 80  Temp: 98.5 F (36.9 C)  TempSrc: Oral  SpO2: 97%  Weight: 184 lb 6.4 oz (83.6 kg)  Height: 5' 0.3" (1.532 m)   Body mass index is 35.66 kg/m.  Advanced Directives 10/24/2020 10/13/2019 02/14/2019 10/14/2018  Does Patient Have a Medical Advance Directive? No No Yes Yes  Type of Advance Directive - -Public librarianLiving will HFairviewin Chart? - - No - copy requested No - copy requested  Would patient like information on creating a medical advance directive? Yes (MAU/Ambulatory/Procedural Areas - Information given) No - Patient declined - -    Current Medications (verified) Outpatient Encounter Medications as of 10/24/2020  Medication Sig   Accu-Chek Softclix Lancets lancets Use to check blood sugars twice daily E11.69   acetaminophen (TYLENOL) 500 MG tablet Take 500 mg by mouth every 6 (six) hours as needed.   allopurinol (ZYLOPRIM) 100 MG tablet TAKE 1 TABLET BY MOUTH EVERY DAY   amLODipine (NORVASC) 10 MG tablet Take 1 tablet (10 mg total) by mouth daily.   Biotin 10000 MCG TBDP 1 tablet daily as needed.   blood glucose meter kit and supplies KIT Dispense based on patient and insurance preference. Use up to two times daily as directed. (FOR ICD-9 250.00, 250.01).    buPROPion (WELLBUTRIN XL) 150 MG 24 hr tablet TAKE 1 TABLET(150 MG) BY MOUTH EVERY MORNING   carvedilol (COREG) 6.25 MG tablet Take 1 tablet (6.25 mg total) by mouth 2 (two) times daily with a meal.   Cholecalciferol (VITAMIN D3) 2000 units TABS Take by mouth.   Garlic 13785MG CAPS Take 1 capsule by mouth daily.   glucose blood (ACCU-CHEK GUIDE) test strip Check blood sugars twice daily E11.69   glucose blood (ACCU-CHEK SMARTVIEW) test strip Use to check blood sugars twice daily E11.69   metFORMIN (GLUCOPHAGE-XR) 500 MG 24 hr tablet TAKE 1 TABLET BY MOUTH TWICE A DAY   OneTouch Delica Lancets 388FMISC TEST FASTING BG AND 2 HOURS AFTER MEALS   rosuvastatin (CRESTOR) 20 MG tablet TAKE 1 TABLET(20 MG) BY MOUTH DAILY   valsartan-hydrochlorothiazide (DIOVAN-HCT) 160-25 MG tablet Take 1 tablet by mouth daily.   No facility-administered encounter medications on file as of 10/24/2020.    Allergies (verified) Ace inhibitors, Atorvastatin, Benadryl [diphenhydramine], and Peppermint flavor [flavoring agent]   History: Past Medical History:  Diagnosis Date   Anxiety    [place]ast cancer ([place]    [place]    Diabetes mellitus without complication ([place]    Dysphagia    GERD (gastroesophageal reflux disease) 10/12/2020   Hair loss 10/12/2020   Hx of adenomatous polyp of colon 06/2015   [facility]   Hypertension    Irregular heart beat    Sleep apnea    Past Surgical History:  Procedure Laterality Date   BREAST  EXCISIONAL BIOPSY Bilateral    BREAST LUMPECTOMY Left 2016   Family History  Problem Relation Age of Onset   Heart failure Mother        CAD   COPD Mother    Hypertension Mother    Cancer Father    Rectal cancer Maternal Grandmother        Unsure   Colon cancer Maternal Grandmother    Heart attack Maternal Grandfather    Social History   Socioeconomic History   Marital status: Widowed    Spouse name: Not on file   Number of children: Not on file   Years  of education: Not on file   Highest education level: Not on file  Occupational History   Occupation: retired  Tobacco Use   Smoking status: Never   Smokeless tobacco: Never  Vaping Use   Vaping Use: Never used  Substance and Sexual Activity   Alcohol use: Never   Drug use: Never   Sexual activity: Not Currently  Other Topics Concern   Not on file  Social History Narrative   Lives home alone.  Widowed.  Retired.  MS counseling education. 4 cups coffee/ wk.     Social Determinants of Health   Financial Resource Strain: Low Risk    Difficulty of Paying Living Expenses: Not hard at all  Food Insecurity: No Food Insecurity   Worried About Charity fundraiser in the Last Year: Never true   [place] in the Last Year: Never true  Transportation Needs: No Transportation Needs   Lack of Transportation (Medical): No   Lack of Transportation (Non-Medical): No  Physical Activity: Insufficiently Active   Days of Exercise per Week: 2 days   Minutes of Exercise per Session: 60 min  Stress: No Stress Concern Present   Feeling of Stress : Not at all  Social Connections: Not on file    Tobacco Counseling Counseling given: Not Answered   Clinical Intake:  Pre-visit preparation completed: Yes  Pain : No/denies pain     Nutritional Status: BMI > 30  Obese Nutritional Risks: None Diabetes: Yes  How often do you need to have someone help you when you read instructions, pamphlets, or other written materials from your doctor or pharmacy?: 1 - Never What is the last grade level you completed in school?: masters degree  Diabetic? Yes Nutrition Risk Assessment:  Has the patient had any N/V/D within the last 2 months?  No  Does the patient have any non-healing wounds?  No  Has the patient had any unintentional weight loss or weight gain?  No   Diabetes:  Is the patient diabetic?  Yes  If diabetic, was a CBG obtained today?  No  Did the patient bring in their glucometer from  home?  No  How often do you monitor your CBG's? Three times weekly.   Financial Strains and Diabetes Management:  Are you having any financial strains with the device, your supplies or your medication? No .  Does the patient want to be seen by Chronic Care Management for management of their diabetes?  No  Would the patient like to be referred to a Nutritionist or for Diabetic Management?  No   Diabetic Exams:  Diabetic Eye Exam: Completed 12/20/2019 Diabetic Foot Exam: Completed 06/25/2020   Interpreter Needed?: No  Information entered by :: NAllen LPN   Activities of Daily Living In your present state of health, do you have any difficulty performing the following activities:  10/24/2020  Hearing? N  Vision? N  Difficulty concentrating or making decisions? N  Walking or climbing stairs? N  Dressing or bathing? N  Doing errands, shopping? N  Preparing Food and eating ? N  Using the Toilet? N  In the past six months, have you accidently leaked urine? Y  Comment once with sneezing  Do you have problems with loss of bowel control? N  Managing your Medications? N  Managing your Finances? N  Housekeeping or managing your Housekeeping? N  Some recent data might be hidden    Patient Care Team: [name], FNP as PCP - General ([place]) Skeet Latch, MD as PCP - Cardiology (Cardiology) Avelino Leeds  Indicate any recent Medical Services you may have received from other than [facility] providers in the past year (date may be approximate).     Assessment:   This is a routine wellness examination for Yenifer.  Hearing/Vision screen No results found.  Dietary issues and exercise activities discussed: Current Exercise Habits: Home exercise routine, Type of exercise: walking, Time (Minutes): 60, Frequency (Times/Week): 2, Weekly Exercise (Minutes/Week): 120   Goals Addressed             This Visit's Progress    Patient Stated       10/24/2020, increase strength         Depression Screen PHQ 2/9 Scores 10/24/2020 11/07/2019 10/13/2019 08/01/2019 05/19/2019 01/20/2019 12/28/2018  PHQ - 2 Score 0 0 0 0 0 0 0  PHQ- 9 Score - 0 - 8 - 0 -    Fall Risk Fall Risk  10/24/2020 11/07/2019 10/13/2019 05/19/2019 01/20/2019  Falls in the past year? 0 0 1 1 0  Comment - - gardening - -  Number falls in past yr: - - 0 0 -  Injury with Fall? - - 1 1 -  Comment - - hurt r knee, had therapy - -  Risk for fall due to : Medication side effect - History of fall(s);Medication side effect - -  Follow up Falls evaluation completed;Education provided;Falls prevention discussed - Falls evaluation completed;Education provided;Falls prevention discussed - -    FALL RISK PREVENTION PERTAINING TO THE HOME:  Any stairs in or around the home? Yes  If so, are there any without handrails? No  Home free of loose throw rugs in walkways, pet beds, electrical cords, etc? Yes  Adequate lighting in your home to reduce risk of falls? Yes   ASSISTIVE DEVICES UTILIZED TO PREVENT FALLS:  Life alert? No  Use of a cane, walker or w/c? No  Grab bars in the bathroom? Yes  Shower chair or bench in shower? No  Elevated toilet seat or a handicapped toilet? Yes   TIMED UP AND GO:  Was the test performed? No .     Gait steady and fast without use of assistive device  Cognitive Function: MMSE - Mini Mental State Exam 11/01/2019 05/17/2019  Orientation to time 5 5  Orientation to Place 5 5  Registration 3 3  Attention/ Calculation 2 2  Recall 2 3  Language- name 2 objects 2 2  Language- repeat 1 1  Language- follow 3 step command 3 3  Language- read & follow direction 1 1  Write a sentence 1 1  Copy design 1 0  Copy design-comments 10 animals -  Total score 26 26     6CIT Screen 10/24/2020 10/13/2019 10/14/2018  What Year? 0 points 0 points 0 points  What month? 0 points 0  points 0 points  What time? 0 points 0 points 0 points  Count back from 20 2 points 0 points 0 points  Months in reverse  0 points 2 points 0 points  Repeat phrase 0 points 0 points 0 points  Total Score 2 2 0    Immunizations Immunization History  Administered Date(s) Administered   PFIZER(Purple Top)SARS-COV-2 Vaccination 07/04/2019, 07/25/2019, 02/17/2020    TDAP status: Due, Education has been provided regarding the importance of this vaccine. Advised may receive this vaccine at local pharmacy or Health Dept. Aware to provide a copy of the vaccination record if obtained from local pharmacy or Health Dept. Verbalized acceptance and understanding.  Flu Vaccine status: Declined, Education has been provided regarding the importance of this vaccine but patient still declined. Advised may receive this vaccine at local pharmacy or Health Dept. Aware to provide a copy of the vaccination record if obtained from local pharmacy or Health Dept. Verbalized acceptance and understanding.  Pneumococcal vaccine status: Declined,  Education has been provided regarding the importance of this vaccine but patient still declined. Advised may receive this vaccine at local pharmacy or Health Dept. Aware to provide a copy of the vaccination record if obtained from local pharmacy or Health Dept. Verbalized acceptance and understanding.   Covid-19 vaccine status: Completed vaccines  Qualifies for Shingles Vaccine? Yes   Zostavax completed No   Shingrix Completed?: No.    Education has been provided regarding the importance of this vaccine. Patient has been advised to call insurance company to determine out of pocket expense if they have not yet received this vaccine. Advised may also receive vaccine at local pharmacy or Health Dept. Verbalized acceptance and understanding.  Screening Tests Health Maintenance  Topic Date Due   TETANUS/TDAP  Never done   Zoster Vaccines- Shingrix (1 of 2) Never done   PNA vac Low Risk Adult (1 of 2 - PCV13) Never done   COVID-19 Vaccine (4 - Booster for Pfizer series) 05/19/2020   COLONOSCOPY (Pts  45-23yr Insurance coverage will need to be confirmed)  07/16/2020   INFLUENZA VACCINE  12/10/2020   OPHTHALMOLOGY EXAM  12/19/2020   HEMOGLOBIN A1C  03/27/2021   FOOT EXAM  06/25/2021   DEXA SCAN  Completed   Hepatitis C Screening  Completed   HPV VACCINES  Aged Out    Health Maintenance  Health Maintenance Due  Topic Date Due   TETANUS/TDAP  Never done   Zoster Vaccines- Shingrix (1 of 2) Never done   PNA vac Low Risk Adult (1 of 2 - PCV13) Never done   COVID-19 Vaccine (4 - Booster for P[place]series) 05/19/2020   COLONOSCOPY (Pts 45-466yrInsurance coverage will need to be confirmed)  07/16/2020    Colorectal cancer screening: scheduled 01/10/2021  Mammogram status: Completed 03/12/2020. Repeat every year  Bone Density status: Completed 10/03/2020.   Lung Cancer Screening: (Low Dose CT Chest recommended if Age 78-80ears, 30 pack-year currently smoking OR have quit w/in 15years.) does not qualify.   Lung Cancer Screening Referral: no  Additional Screening:  Hepatitis C Screening: does qualify; Completed 02/16/2020  Vision Screening: Recommended annual ophthalmology exams for early detection of glaucoma and other disorders of the eye. Is the patient up to date with their annual eye exam?  Yes  Who is the provider or what is the name of the office in which the patient attends annual eye exams? Dr. ZaFranne Fortsf pt is not established with a provider, would they like to  be referred to a provider to establish care? No .   Dental Screening: Recommended annual dental exams for proper oral hygiene  Community Resource Referral / Chronic Care Management: CRR required this visit?  No   CCM required this visit?  No      Plan:     I have personally reviewed and noted the following in the patient's chart:   Medical and social history Use of alcohol, tobacco or illicit drugs  Current medications and supplements including opioid prescriptions.  Functional ability and  status Nutritional status Physical activity Advanced directives List of other physicians Hospitalizations, surgeries, and ER visits in previous 12 months Vitals Screenings to include cognitive, depression, and falls Referrals and appointments  In addition, I have reviewed and discussed with patient certain preventive protocols, quality metrics, and best practice recommendations. A written personalized care plan for preventive services as well as general preventive health recommendations were provided to patient.     Kellie Simmering, LPN   [id number]   Nurse Notes:

## 2020-10-24 NOTE — Patient Instructions (Signed)
Jasmine Lopez , Thank you for taking time to come for your Medicare Wellness Visit. I appreciate your ongoing commitment to your health goals. Please review the following plan we discussed and let me know if I can assist you in the future.   Screening recommendations/referrals: Colonoscopy: scheduled 01/10/2021 Mammogram: completed 03/12/2020 Bone Density: completed 10/03/2020 Recommended yearly ophthalmology/optometry visit for glaucoma screening and checkup Recommended yearly dental visit for hygiene and checkup  Vaccinations: Influenza vaccine: decline Pneumococcal vaccine: decline Tdap vaccine: decline Shingles vaccine: discussed   Covid-19: 02/17/2020, 07/25/2019, 07/04/2019  Advanced directives: Advance directive discussed with you today. I have provided a copy for you to complete at home and have notarized. Once this is complete please bring a copy in to our office so we can scan it into your chart.  Conditions/risks identified: none  Next appointment: Follow up in one year for your annual wellness visit    Preventive Care 65 Years and Older, Female Preventive care refers to lifestyle choices and visits with your health care provider that can promote health and wellness. What does preventive care include? A yearly physical exam. This is also called an annual well check. Dental exams once or twice a year. Routine eye exams. Ask your health care provider how often you should have your eyes checked. Personal lifestyle choices, including: Daily care of your teeth and gums. Regular physical activity. Eating a healthy diet. Avoiding tobacco and drug use. Limiting alcohol use. Practicing safe sex. Taking low-dose aspirin every day. Taking vitamin and mineral supplements as recommended by your health care provider. What happens during an annual well check? The services and screenings done by your health care provider during your annual well check will depend on your age, overall health,  lifestyle risk factors, and family history of disease. Counseling  Your health care provider may ask you questions about your: Alcohol use. Tobacco use. Drug use. Emotional well-being. Home and relationship well-being. Sexual activity. Eating habits. History of falls. Memory and ability to understand (cognition). Work and work Statistician. Reproductive health. Screening  You may have the following tests or measurements: Height, weight, and BMI. Blood pressure. Lipid and cholesterol levels. These may be checked every 5 years, or more frequently if you are over 44 years old. Skin check. Lung cancer screening. You may have this screening every year starting at age 73 if you have a 30-pack-year history of smoking and currently smoke or have quit within the past 15 years. Fecal occult blood test (FOBT) of the stool. You may have this test every year starting at age 7. Flexible sigmoidoscopy or colonoscopy. You may have a sigmoidoscopy every 5 years or a colonoscopy every 10 years starting at age 39. Hepatitis C blood test. Hepatitis B blood test. Sexually transmitted disease (STD) testing. Diabetes screening. This is done by checking your blood sugar (glucose) after you have not eaten for a while (fasting). You may have this done every 1-3 years. Bone density scan. This is done to screen for osteoporosis. You may have this done starting at age 49. Mammogram. This may be done every 1-2 years. Talk to your health care provider about how often you should have regular mammograms. Talk with your health care provider about your test results, treatment options, and if necessary, the need for more tests. Vaccines  Your health care provider may recommend certain vaccines, such as: Influenza vaccine. This is recommended every year. Tetanus, diphtheria, and acellular pertussis (Tdap, Td) vaccine. You may need a Td booster every 10  years. Zoster vaccine. You may need this after age  15. Pneumococcal 13-valent conjugate (PCV13) vaccine. One dose is recommended after age 63. Pneumococcal polysaccharide (PPSV23) vaccine. One dose is recommended after age 49. Talk to your health care provider about which screenings and vaccines you need and how often you need them. This information is not intended to replace advice given to you by your health care provider. Make sure you discuss any questions you have with your health care provider. Document Released: 05/25/2015 Document Revised: 01/16/2016 Document Reviewed: 02/27/2015 Elsevier Interactive Patient Education  2017 [place] Prevention in the Home Falls can cause injuries. They can happen to people of all ages. There are many things you can do to make your home safe and to help prevent falls. What can I do on the outside of my home? Regularly fix the edges of walkways and driveways and fix any cracks. Remove anything that might make you trip as you walk through a door, such as a raised step or threshold. Trim any bushes or trees on the path to your home. Use bright outdoor lighting. Clear any walking paths of anything that might make someone trip, such as rocks or tools. Regularly check to see if handrails are loose or broken. Make sure that both sides of any steps have handrails. Any raised decks and porches should have guardrails on the edges. Have any leaves, snow, or ice cleared regularly. Use sand or salt on walking paths during winter. Clean up any spills in your garage right away. This includes oil or grease spills. What can I do in the bathroom? Use night lights. Install grab bars by the toilet and in the tub and shower. Do not use towel bars as grab bars. Use non-skid mats or decals in the tub or shower. If you need to sit down in the shower, use a plastic, non-slip stool. Keep the floor dry. Clean up any water that spills on the floor as soon as it happens. Remove soap buildup in the tub or shower  regularly. Attach bath mats securely with double-sided non-slip rug tape. Do not have throw rugs and other things on the floor that can make you trip. What can I do in the bedroom? Use night lights. Make sure that you have a light by your bed that is easy to reach. Do not use any sheets or blankets that are too big for your bed. They should not hang down onto the floor. Have a firm chair that has side arms. You can use this for support while you get dressed. Do not have throw rugs and other things on the floor that can make you trip. What can I do in the kitchen? Clean up any spills right away. Avoid walking on wet floors. Keep items that you use a lot in easy-to-reach places. If you need to reach something above you, use a strong step stool that has a grab bar. Keep electrical cords out of the way. Do not use floor polish or wax that makes floors slippery. If you must use wax, use non-skid floor wax. Do not have throw rugs and other things on the floor that can make you trip. What can I do with my stairs? Do not leave any items on the stairs. Make sure that there are handrails on both sides of the stairs and use them. Fix handrails that are broken or loose. Make sure that handrails are as long as the stairways. Check any carpeting to make sure  that it is firmly attached to the stairs. Fix any carpet that is loose or worn. Avoid having throw rugs at the top or bottom of the stairs. If you do have throw rugs, attach them to the floor with carpet tape. Make sure that you have a light switch at the top of the stairs and the bottom of the stairs. If you do not have them, ask someone to add them for you. What else can I do to help prevent falls? Wear shoes that: Do not have high heels. Have rubber bottoms. Are comfortable and fit you well. Are closed at the toe. Do not wear sandals. If you use a stepladder: Make sure that it is fully opened. Do not climb a closed stepladder. Make sure that  both sides of the stepladder are locked into place. Ask someone to hold it for you, if possible. Clearly mark and make sure that you can see: Any grab bars or handrails. First and last steps. Where the edge of each step is. Use tools that help you move around (mobility aids) if they are needed. These include: Canes. Walkers. Scooters. Crutches. Turn on the lights when you go into a dark area. Replace any light bulbs as soon as they burn out. Set up your furniture so you have a clear path. Avoid moving your furniture around. If any of your floors are uneven, fix them. If there are any pets around you, be aware of where they are. Review your medicines with your doctor. Some medicines can make you feel dizzy. This can increase your chance of falling. Ask your doctor what other things that you can do to help prevent falls. This information is not intended to replace advice given to you by your health care provider. Make sure you discuss any questions you have with your health care provider. Document Released: 02/22/2009 Document Revised: 10/04/2015 Document Reviewed: 06/02/2014 Elsevier Interactive Patient Education  2017 [employer].

## 2020-10-24 NOTE — Progress Notes (Signed)
[facility] PRE[facility] Progress Report   Patient Details  Name: Jasmine Lopez MRN: [id number] Date of Birth: 07-10-1940 Age: 80 y.o. PCP: [name], FNP  Vitals:   10/24/20 1335  BP: (!) 142/68  Pulse: 66  SpO2: 96%  Weight: 185 lb 9.6 oz (84.2 kg)      [facility] Eval - 10/24/20 1300       Referral    Referring Provider Oval Linsey    Reason for referral Hypertension;Inactivity;Obesitity/Overweight;Diabetes    Program Start Date 10/30/20   T/TH 1pm - 215pm x 12 wks     Measurement   Waist Circumference 40 inches    Hip Circumference 44 inches    Body fat 45 percent      Information for Trainer   Goals --   restart exercise program, get A1C under 6 currently at 6.9, better BP control goal 120/80   [facility]    Orthopedic Concerns Knees sometimes    Pertinent Medical History HTN, DM, hx of breast cancer    Current Barriers cost of gas needs help with transportation    Restrictions/Precautions Diabetic snack before exercise;Fall risk    Medications that affect exercise Medication causing dizziness/drowsiness;Beta blocker      Timed Up and Go (TUGS)   Timed Up and Go Moderate risk 10-12 seconds   no falls in last 6 months, balance is better with shoe inserts     Mobility and Daily Activities   I find it easy to walk up or down two or more flights of stairs. 2    I have no trouble taking out the trash. 4    I do housework such as vacuuming and dusting on my own without difficulty. 4    I can easily lift a gallon of milk (8lbs). 4    I can easily walk a mile. 2    I have no trouble reaching into high cupboards or reaching down to pick up something from the floor. 4    I do not have trouble doing out-door work such as Armed forces logistics/support/administrative officer, raking leaves, or gardening. 2      Mobility and Daily Activities   I feel younger than my age. 2    I feel independent. 3    I feel energetic. 3    I live an active life.  3    I feel strong. 4    I feel  healthy. 2    I feel active as other people my age. 2      How fit and strong are you.   Fit and Strong Total Score 41            Past Medical History:  Diagnosis Date   Anxiety    Breast cancer ([place])    Cancer ([place])    Diabetes mellitus without complication ([place])    Dysphagia    GERD (gastroesophageal reflux disease) 10/12/2020   Hair loss 10/12/2020   Hx of adenomatous polyp of colon 06/2015   [name]- [facility]   Hypertension    Irregular heart beat    Sleep apnea    Past Surgical History:  Procedure Laterality Date   BREAST EXCISIONAL BIOPSY Bilateral    BREAST LUMPECTOMY Left 2016   Social History   Tobacco Use  Smoking Status Never  Smokeless Tobacco Never   Class will be at [facility] Completed PREP prior however did not maintain lifestyle changes Hbg A1C per  patient is 6.9 and BP is not at goal of 120/80 Motivated to get back to working on exercise and nutrition  Will need assist with transport  Will arrange for her   [name] 10/24/2020, 1:44 PM

## 2020-10-30 ENCOUNTER — Telehealth: Payer: Self-pay

## 2020-10-30 DIAGNOSIS — Z0000 Encounter for general adult medical examination without abnormal findings: Secondary | ICD-10-CM

## 2020-10-30 NOTE — Progress Notes (Signed)
[facility] PRE[facility] Weekly Session   Patient Details  Name: Jasmine Lopez MRN: [id number] Date of Birth: March 02, 1941 Age: 80 y.o. PCP: [name], FNP  There were no vitals filed for this visit.   [facility] Weekly seesion - 10/30/20 1700       Weekly Session   Topic Discussed Goal setting and welcome to the program   Scale of perceived exertion reviewd   Classes attended to date [place] 10/30/2020, 5:27 PM

## 2020-10-30 NOTE — Telephone Encounter (Signed)
Returned patient's call regarding rescheduling health coaching appointment on 11/01/20 at 1:30pm. Patient will be attending PREP class on T & TH during this time until September, 2022. Patient has been rescheduled for 11/05/20 at 11:15am in person.

## 2020-11-01 ENCOUNTER — Ambulatory Visit: Payer: Medicare PPO | Admitting: Internal Medicine

## 2020-11-01 ENCOUNTER — Ambulatory Visit: Payer: Medicare PPO | Admitting: Neurology

## 2020-11-01 ENCOUNTER — Ambulatory Visit: Payer: Medicare PPO

## 2020-11-05 ENCOUNTER — Telehealth: Payer: Self-pay | Admitting: Cardiovascular Disease

## 2020-11-05 ENCOUNTER — Ambulatory Visit (INDEPENDENT_AMBULATORY_CARE_PROVIDER_SITE_OTHER): Payer: Medicare PPO

## 2020-11-05 ENCOUNTER — Other Ambulatory Visit: Payer: Self-pay

## 2020-11-05 ENCOUNTER — Telehealth: Payer: Self-pay | Admitting: Neurology

## 2020-11-05 DIAGNOSIS — S29012A Strain of muscle and tendon of back wall of thorax, initial encounter: Secondary | ICD-10-CM | POA: Diagnosis not present

## 2020-11-05 DIAGNOSIS — M9902 Segmental and somatic dysfunction of thoracic region: Secondary | ICD-10-CM | POA: Diagnosis not present

## 2020-11-05 DIAGNOSIS — M9903 Segmental and somatic dysfunction of lumbar region: Secondary | ICD-10-CM | POA: Diagnosis not present

## 2020-11-05 DIAGNOSIS — M9905 Segmental and somatic dysfunction of pelvic region: Secondary | ICD-10-CM | POA: Diagnosis not present

## 2020-11-05 DIAGNOSIS — M5136 Other intervertebral disc degeneration, lumbar region: Secondary | ICD-10-CM | POA: Diagnosis not present

## 2020-11-05 DIAGNOSIS — M5032 Other cervical disc degeneration, mid-cervical region, unspecified level: Secondary | ICD-10-CM | POA: Diagnosis not present

## 2020-11-05 DIAGNOSIS — M47817 Spondylosis without myelopathy or radiculopathy, lumbosacral region: Secondary | ICD-10-CM | POA: Diagnosis not present

## 2020-11-05 DIAGNOSIS — Z0000 Encounter for general adult medical examination without abnormal findings: Secondary | ICD-10-CM

## 2020-11-05 DIAGNOSIS — M9901 Segmental and somatic dysfunction of cervical region: Secondary | ICD-10-CM | POA: Diagnosis not present

## 2020-11-05 NOTE — Telephone Encounter (Signed)
Pt was referred to a dermatologist today but that office doesn't have any appts for more than 4 months, pt would like a referral sent to a different dermatologist. Please advise further

## 2020-11-05 NOTE — Progress Notes (Signed)
Appointment Outcome:  Completed, Session #: 5 Start time: 11:15am   End time: 11:53am   Total Mins: 38 minutes  AGREEMENTS SECTION    Overall Goal(s): Stress management Reduce sodium consumption                                             Agreement/Action Steps:   Reduce Sodium Consumption Read food labels to monitor sodium intake Monitor sodium consumption with sodium tracker Drink tea around 9am Eat breakfast 10:30am -11:00am Eat lunch by 3pm Eat dinner at 7pm   Stress Management Walking at [place] 11:30am -12:30pm (Mon, Wed, Fri of each week) PREP class T & Th Utilize support system Write in gratitude journal daily (3 things grateful for and/or 3 things was successful at doing) -  Set an alarm for 5:30pm to start writing for 10-15 minutes  Conduct self-check-ins and implement positive self-talk Find a hobby   Progress Notes:  Patient stated that she keeps her steps on her mind and that scheduling is helping. Patient feels that she has become more flexible and is not procrastinating like before.   Patient stated that she doesn't purchase groceries that often but did go shopping with her sister-in-law and was able to read food labels. Patient stated that she has been purchasing free produce and has been trying to stay away from carbs and limit her meat consumption. Patient stated that she purchased a variety of berries and a cantaloupe. Patient stated that it is difficult to keep track of food labels when she eats out.   Patient stated that she tried to choose the healthiest foods when she does eat at restaurants. Patient stated that over the weekend, she asked for low-sodium soy sauce at a [place]. However, the food was cooked in regular soy sauce, and she opted not to eat all the food, especially the rice. Patient ate majority of the vegetables. Patient stated that now she is very aware of what's salty or sweet because the change in her taste buds. Patient stated that she is  getting use to unsweet tea but loves chocolate. Patient stated that she rather not has certain foods around like sweets so she can avoid eating it. Patient shared that if she purchases something and learn that it is not a healthy option for her, she gives the food away.   Patient stated that she received the sodium trackers but need to pick them up from her son's house. Patient stated that she looked at her email as a reference and drew her own chart to record her sodium per meal/beverage/snack. Patient has not been able to do it at every meal because she has been at her son's home, ate at another son's home for Sunday dinner, and eating out at a restaurant. Patient stated that she did well with drinking more water during week 1 and drunk her water mainly in the evening. Patient stated that she didn't do well during week two because her schedule changed with drinking water. Now she is spacing out drinking water throughout the day so she can add back 20 oz of water to the amount she was drinking during the day.   Patient shared that she has found a hobby. Patient has joined a workout class that includes Yoga and [place]. Patient stated that she went to this class to [facility] she finished her walk. Patient stated the next day she joined the class. Patient stated that being creative with movement and being around people helps relieves stress and calm her thoughts. Patient has been able to start PREP and attend class as scheduled. Patient walked in the [place] during week 1 but was not able to do so during week 2.  Patient stated her children knows her schedule for the daily, so they won't interrupt her. Patient states that she is sharing her progress with doctor's appointments and health coaching for support. Patient stated that with her children asking questions and staying on her about her goals, she needs that additional accountability. Patient is conducting self-check-ins and  positive self-talk to aid in staying on track with her steps. Patient stated that she is finding herself to be thinking more positive lately.  Indicators of Success and Accountability:  Patient stated that adjusting to change was hard when she was escaping things and people, but not she doesn't feel hesitant about engaging in daily activities. Readiness: Patient is in the action phase of stress management and reducing sodium consumption.  Strengths and Supports: Patient is being supported by her children. Patient is flexible, organized, and is becoming a leader.  Challenges and Barriers: Patient does not foresee any challenges or barriers to implementing action steps over the next two weeks.   Coaching Outcomes: Patient stated that she is becoming a leader of her family and she wants to be a healthy example for them. Patient stated that that this is happening by putting herself first.   Patient stated that she will be going by her son's house to pick up printed sodium trackers.  Patient stated that she will start recording her journal on her phone because she when she is not home or gets sidetracked with writing, she can verbally express three things that she is grateful for.   Patient stated that she will have to talk with the [place] instructor because the current class conflicts with her participation in the PREP program. Patient is interested in going to another location if it fits her schedule. Otherwise, patient plans on going to take line dancing classes.   Patient will resume walking in the [place] on Mondays and Wednesdays in addition to participating in [place] on Tuesdays and Thursdays.   Patient shared that she would start attending physical church again soon to get out and connect with others.   Attempted: Fulfilled - Patient is attending PREP as scheduled, reading food labels, utilizing support system, set alarm for 5:30pm, conducting self-check-ins and implement positive  self-talk, and found a hobby. Partial - Patient walked in [facility] during week 1, and patient writes in journal approximately 4 days a week, and patient is attempting to track her sodium with some meals. Patient was able to follow eating schedule most days but missed lunch on some occasions.

## 2020-11-06 ENCOUNTER — Telehealth: Payer: Self-pay

## 2020-11-06 ENCOUNTER — Encounter: Payer: Self-pay | Admitting: Neurology

## 2020-11-06 ENCOUNTER — Ambulatory Visit: Payer: Medicare PPO | Admitting: Neurology

## 2020-11-06 VITALS — BP 135/75 | HR 64 | Wt 186.0 lb

## 2020-11-06 DIAGNOSIS — R413 Other amnesia: Secondary | ICD-10-CM | POA: Diagnosis not present

## 2020-11-06 DIAGNOSIS — Z0000 Encounter for general adult medical examination without abnormal findings: Secondary | ICD-10-CM

## 2020-11-06 NOTE — Telephone Encounter (Signed)
Called patient to inform her that I reached out to Dr. Oval Linsey regarding her concern with her referral to dermatology for hair lost. Patient stated that it will be four months out before she could have been seen and would like to find another office who will take her sooner.  Patient was informed that she can call the back of her insurance card to get a list of dermatologists that are covered in the area.   In the meantime, patient is going call the number on the back of her [facility] Medicare card to get a list of dermatologists in the area that accepts her insurance.   Patient will be updated with response from Dr. Oval Linsey on referral as well.

## 2020-11-06 NOTE — Patient Instructions (Addendum)
Continue with your exercises and social interaction  See you back in 1 year

## 2020-11-06 NOTE — Progress Notes (Signed)
PATIENT: ANIYLA HARLING DOB: 12-18-1940  REASON FOR VISIT: follow up HISTORY FROM: patient  HISTORY OF PRESENT ILLNESS: Today 11/06/20  HISTORY  Carolena GRANT HENKES is a 80 year old female, seen in request by his primary care physician Dr.Lochbuie, Jonelle Sidle for evaluation of mild cognitive impairment, he is accompanied by his son at today's clinical visit on May 17, 2019.   I have reviewed and summarized the referring note from the referring physician.  She has past medical history of hypertension, hyperlipidemia, diabetes, left breast calcified mass surgery,   She retired as a Management consultant at age 34, she went through excessive stress in early 2019, her husband, and her mother passed away few months apart, she moved from [place] to [place] to be close to her son, she lives independently, still driving, tends to rely on her GPS, she has good appetite, has trouble sleeping, tends to be sedentary,   She was noted to have gradual onset mild cognitive malfunction around 2016, she has word finding difficulties, sometimes difficulty to carry on her train of thoughts, only has mild memory difficulty noted.  There was no family history of memory loss   Laboratory evaluations in September 2020 showed normal CBC, CMP showed elevated glucose 136, LDL 132, normal thyroid functional test, A1c was 6.9  Update November 01, 2019 SS: Here today for follow-up unaccompanied.  Indicates she is doing much better.  There was a time when she took herself off Wellbutrin, since being back on, feels her focus is better, processing better.  She had significant stress in 2019 with loss of her husband and mother very close together, is now healing from this, is more confident in her decision making.  Her overall health has been good.  [facility] and [facility], she loves it, plans to continue independently.  Doing well living on her own, is  actually managing buying her mothers home, this is a big step for her and process to manage.  Does her own ADLs, drives a car, uses GPS.  No family history of memory troubles, her mother was 86, memory was very sharp.  MMSE 26/30. She reads, does puzzles often.  MRI of the brain was unremarkable, B12 was normal.  Update November 06, 2020 SS: MMSE 26/30 today. Here alone. Thinks memory is getting better, is being more aware of her thought process, making herself think through things. Has worked through a lot of family stressors. She got a car, living in her mom's house. [facility], group [facility], health and and wellness. Is back on Wellbutrin. Not on any memory medications. Walking a few days a week, with friends. She is doing well.  REVIEW OF SYSTEMS: Out of a complete 14 system review of symptoms, the patient complains only of the following symptoms, and all other reviewed systems are negative.  See HPI  ALLERGIES: Allergies  Allergen Reactions   Ace Inhibitors Cough   Atorvastatin Other (See Comments)    Leg pain    Benadryl [Diphenhydramine] Itching    Benadryl cream   Peppermint Flavor [Flavoring Agent]     Causes horsenss    HOME MEDICATIONS: Outpatient Medications Prior to Visit  Medication Sig Dispense Refill   Accu-Chek Softclix Lancets lancets Use to check blood sugars twice daily E11.69 100 each 2   acetaminophen (TYLENOL) 500 MG tablet Take 500 mg by mouth every 6 (six) hours as needed.  allopurinol (ZYLOPRIM) 100 MG tablet TAKE 1 TABLET BY MOUTH EVERY DAY 90 tablet 0   amLODipine (NORVASC) 10 MG tablet Take 1 tablet (10 mg total) by mouth daily. 90 tablet 2   Biotin 10000 MCG TBDP 1 tablet daily as needed.     blood glucose meter kit and supplies KIT Dispense based on patient and insurance preference. Use up to two times daily as directed. (FOR ICD-9 250.00, 250.01). 1 each 0   buPROPion (WELLBUTRIN XL) 150 MG 24 hr tablet TAKE 1 TABLET(150 MG) BY MOUTH EVERY MORNING 90  tablet 1   carvedilol (COREG) 6.25 MG tablet Take 1 tablet (6.25 mg total) by mouth 2 (two) times daily with a meal. 180 tablet 2   Cholecalciferol (VITAMIN D3) 2000 units TABS Take by mouth.     Garlic 9735 MG CAPS Take 1 capsule by mouth daily.     glucose blood (ACCU-CHEK SMARTVIEW) test strip Use to check blood sugars twice daily E11.69 100 each 2   metFORMIN (GLUCOPHAGE-XR) 500 MG 24 hr tablet TAKE 1 TABLET BY MOUTH TWICE A DAY 180 tablet 1   rosuvastatin (CRESTOR) 20 MG tablet TAKE 1 TABLET(20 MG) BY MOUTH DAILY 90 tablet 3   valsartan-hydrochlorothiazide (DIOVAN-HCT) 160-25 MG tablet Take 1 tablet by mouth daily. 90 tablet 1   glucose blood (ACCU-CHEK GUIDE) test strip Check blood sugars twice daily E11.69 100 each 2   OneTouch Delica Lancets 32D MISC TEST FASTING BG AND 2 HOURS AFTER MEALS 100 each 3   No facility-administered medications prior to visit.    PAST MEDICAL HISTORY: Past Medical History:  Diagnosis Date   Anxiety    Breast cancer ([place])    Cancer ([place])    Diabetes mellitus without complication ([place])    Dysphagia    GERD (gastroesophageal reflux disease) 10/12/2020   Hair loss 10/12/2020   Hx of adenomatous polyp of colon 06/2015   [name]- [facility]   Hypertension    Irregular heart beat    Sleep apnea     PAST SURGICAL HISTORY: Past Surgical History:  Procedure Laterality Date   BREAST EXCISIONAL BIOPSY Bilateral    BREAST LUMPECTOMY Left 2016    FAMILY HISTORY: Family History  Problem Relation Age of Onset   Heart failure Mother        CAD   COPD Mother    Hypertension Mother    Cancer Father    Rectal cancer Maternal Grandmother        Unsure   Colon cancer Maternal Grandmother    Heart attack Maternal Grandfather     SOCIAL HISTORY: Social History   Socioeconomic History   Marital status: Widowed    Spouse name: Not on file   Number of children: Not on file   Years of education: Not on file   Highest education level: Not on  file  Occupational History   Occupation: retired  Tobacco Use   Smoking status: Never   Smokeless tobacco: Never  Vaping Use   Vaping Use: Never used  Substance and Sexual Activity   Alcohol use: Never   Drug use: Never   Sexual activity: Not Currently  Other Topics Concern   Not on file  Social History Narrative   Lives home alone.  Widowed.  Retired.  MS counseling education. 4 cups coffee/ wk.     Social Determinants of Health   Financial Resource Strain: Low Risk    Difficulty of Paying Living Expenses: Not hard at all  Food Insecurity: No Food Insecurity   Worried About Charity fundraiser in the Last Year: Never true   Ran Out of Food in the Last Year: Never true  Transportation Needs: No Transportation Needs   Lack of Transportation (Medical): No   Lack of Transportation (Non-Medical): No  Physical Activity: Insufficiently Active   Days of Exercise per Week: 2 days   Minutes of Exercise per Session: 60 min  Stress: No Stress Concern Present   Feeling of Stress : Not at all  Social Connections: Not on file  Intimate Partner Violence: Not on file   PHYSICAL EXAM  Vitals:   11/06/20 1408  BP: 135/75  Pulse: 64  Weight: 186 lb (84.4 kg)    Body mass index is 35.97 kg/m.  Generalized: Well developed, in no acute distress  MMSE - Mini Mental State Exam 11/06/2020 11/01/2019 05/17/2019  Orientation to time '5 5 5  ' Orientation to Place '5 5 5  ' Registration '3 3 3  ' Attention/ Calculation '2 2 2  ' Recall '3 2 3  ' Language- name 2 objects '2 2 2  ' Language- repeat '1 1 1  ' Language- follow 3 step command '2 3 3  ' Language- read & follow direction '1 1 1  ' Write a sentence '1 1 1  ' Copy design 1 1 0  Copy design-comments - 10 animals -  Total score '26 26 26    ' Neurological examination  Mentation: Alert oriented to time, place, history taking. Follows all commands speech and language fluent Cranial nerve II-XII: Pupils were equal round reactive to light. Extraocular movements  were full, visual field were full on confrontational test. Facial sensation and strength were normal.  Head turning and shoulder shrug  were normal and symmetric. Motor: The motor testing reveals 5 over 5 strength of all 4 extremities. Good symmetric motor tone is noted throughout.  Sensory: Sensory testing is intact to soft touch on all 4 extremities. No evidence of extinction is noted.  Coordination: Cerebellar testing reveals good finger-nose-finger and heel-to-shin bilaterally.  Gait and station: Gait is normal.  Reflexes: Deep tendon reflexes are symmetric and normal bilaterally.   DIAGNOSTIC DATA (LABS, IMAGING, TESTING) - I reviewed patient records, labs, notes, testing and imaging myself where available.  Lab Results  Component Value Date   WBC 6.1 10/13/2019   HGB 13.6 10/13/2019   HCT 40.3 10/13/2019   MCV 89 10/13/2019   PLT 224 10/13/2019      Component Value Date/Time   NA 144 09/24/2020 0923   K 4.8 09/24/2020 0923   CL 105 09/24/2020 0923   CO2 24 09/24/2020 0923   GLUCOSE 138 (H) 09/24/2020 0923   BUN 15 09/24/2020 0923   CREATININE 1.06 (H) 09/24/2020 0923   CALCIUM 9.6 09/24/2020 0923   PROT 6.6 09/24/2020 0923   ALBUMIN 4.3 09/24/2020 0923   AST 17 09/24/2020 0923   ALT 22 09/24/2020 0923   ALKPHOS 63 09/24/2020 0923   BILITOT 0.4 09/24/2020 0923   GFRNONAA 63 06/25/2020 0934   GFRAA 72 06/25/2020 0934   Lab Results  Component Value Date   CHOL 144 09/24/2020   HDL 55 09/24/2020   LDLCALC 79 09/24/2020   TRIG 44 09/24/2020   CHOLHDL 2.6 09/24/2020   Lab Results  Component Value Date   HGBA1C 6.9 (H) 09/24/2020   Lab Results  Component Value Date   [id number] 747 05/17/2019   Lab Results  Component Value Date   TSH 1.910 10/12/2020  ASSESSMENT AND PLAN 80 y.o. year old female  has a past medical history of Anxiety, Breast cancer ([place]), Cancer ([place]), Diabetes mellitus without complication ([place]), Dysphagia, GERD (gastroesophageal reflux  disease) (10/12/2020), Hair loss (10/12/2020), adenomatous polyp of colon (06/2015), Hypertension, Irregular heart beat, and Sleep apnea. here with:  1.  Mild cognitive impairment -Doing well, MMSE 26/30, living alone, got her own car, [facility], [facility] with friends -Stress, mood disorder related, versus early onset central nervous system degenerative disorder (none reported family history of memory trouble) -MRI of the brain was unremarkable, mild age-appropriate changes of chronic small vessel disease and supratentorial cortical atrophy -Vitamin B12 was normal -Encouraged to continue exercise, brain stimulating activities, not on any memory medications  -We will follow-up in 1 year or sooner if needed  Butler Denmark, Laqueta Jean, DNP 11/06/2020, 2:19 PM [facility] [address] [place],  [address] [phone]

## 2020-11-06 NOTE — Telephone Encounter (Signed)
Called patient to inform her that I reached out to Dr. Oval Linsey regarding her concern with her referral to dermatology for hair lost. Patient stated that it will be four months out before she could have been seen and would like to find another office who will take her sooner.  Patient was informed that she can call the back of her insurance card to get a list of dermatologists that are covered in the area.   In the meantime, patient is going call the number on the back of her [facility] Medicare card to get a list of dermatologists in the area that accepts her insurance.   Patient will be updated with response from Dr. Oval Linsey on referral as well.

## 2020-11-11 ENCOUNTER — Other Ambulatory Visit: Payer: Self-pay | Admitting: Nurse Practitioner

## 2020-11-13 NOTE — Progress Notes (Signed)
[facility] PRE[facility] Weekly Session   Patient Details  Name: Jasmine Lopez MRN: [id number] Date of Birth: 28-Nov-1940 Age: 80 y.o. PCP: [name], FNP  Vitals:   11/13/20 1524  Weight: 186 lb 8 oz (84.6 kg)     [facility] Weekly seesion - 11/13/20 1500       Weekly Session   Topic Discussed Healthy eating tips   keeping added sugars below 24 g per day   Minutes exercised this week 90 minutes    Classes attended to date 4            Class held at [facility] 11/13/2020, 3:25 PM

## 2020-11-19 ENCOUNTER — Ambulatory Visit (INDEPENDENT_AMBULATORY_CARE_PROVIDER_SITE_OTHER): Payer: Medicare PPO

## 2020-11-19 ENCOUNTER — Other Ambulatory Visit: Payer: Self-pay

## 2020-11-19 DIAGNOSIS — M9903 Segmental and somatic dysfunction of lumbar region: Secondary | ICD-10-CM | POA: Diagnosis not present

## 2020-11-19 DIAGNOSIS — M9902 Segmental and somatic dysfunction of thoracic region: Secondary | ICD-10-CM | POA: Diagnosis not present

## 2020-11-19 DIAGNOSIS — M5032 Other cervical disc degeneration, mid-cervical region, unspecified level: Secondary | ICD-10-CM | POA: Diagnosis not present

## 2020-11-19 DIAGNOSIS — M9901 Segmental and somatic dysfunction of cervical region: Secondary | ICD-10-CM | POA: Diagnosis not present

## 2020-11-19 DIAGNOSIS — Z0000 Encounter for general adult medical examination without abnormal findings: Secondary | ICD-10-CM

## 2020-11-19 DIAGNOSIS — M9905 Segmental and somatic dysfunction of pelvic region: Secondary | ICD-10-CM | POA: Diagnosis not present

## 2020-11-19 DIAGNOSIS — M47817 Spondylosis without myelopathy or radiculopathy, lumbosacral region: Secondary | ICD-10-CM | POA: Diagnosis not present

## 2020-11-19 DIAGNOSIS — M5136 Other intervertebral disc degeneration, lumbar region: Secondary | ICD-10-CM | POA: Diagnosis not present

## 2020-11-19 DIAGNOSIS — S29012A Strain of muscle and tendon of back wall of thorax, initial encounter: Secondary | ICD-10-CM | POA: Diagnosis not present

## 2020-11-19 NOTE — Progress Notes (Signed)
Appointment Outcome:  Completed, Session #: 6 Start time: 11:06am   End time: 11:41am   Total Mins: 35 minutes  AGREEMENTS SECTION    Overall Goal(s): Stress management Reduce sodium consumption                                             Agreement/Action Steps:   Reduce Sodium Consumption Read food labels to monitor sodium intake Monitor sodium consumption with sodium tracker Drink tea around 9am Eat breakfast 10:30am -11:00am Eat lunch by 3pm Eat dinner at 7pm   Stress Management Walking at [place] 11:30am -12:30pm (Mon, Wed, Fri of each week) PREP class T & Th Utilize support system Write in gratitude journal daily (3 things grateful for and/or 3 things was successful at doing) -  Set an alarm for 5:30pm to start writing for 10-15 minutes  Conduct self-check-ins and implement positive self-talk Find a hobby  Progress Notes:  Patient could not engage in [place] and Yoga because it is conflicts with the days that she has PREP. She is attending PREP as scheduled. Patient has not been walking at [facility] recently. Patient has been using PREP in place of walking. Patient stated that she is used to going to the [facility] and do not want to change facilities to engage in extra physical activity. However, patient mentioned that she walks inside the house every 30 minutes every day. Patient stated that she typically counts to 100 or higher.  Patient stated that she has been thinking about returning to church in-person. Patient stated that her hobby/service would include serving with the [employer] or other community events. Patient things donating her time and being around others will aid in stress management because she has limited interactions with others outside of family.   Patient stated that typically she would have conversations that she is not ready to engage in. Patient mentioned that she has started telling others that she will discuss things later or change the subject. Patient  implementing healthy boundaries during conversations has helped reduce stress.  Patient continues to utilize her children and other family members as her support system. Patient stated that she is sharing her journey with others to help hold her accountable. Patient mentioned that she is also using positive self-talk and conduct check-ins to access how she is feeling or thinking and to encourage herself to do certain things. Patient stated that she was given a tomato plate to take care off by her sister-n-law that will aid in reducing stress by taking mind off things.   Patient is rewarding herself with a ticket to a banquet. Patient stated that she be in so many places during the day that she is grateful for the 5:30pm reminder to write in her journal. Because patient did not have the journal sheets, she has been mentally and verbally engaging in journaling. Patient mentioned that she is also putting her gratitude thoughts into her notes in her phone. Patient is interested in the printed version of the journal instructions and sheets.   Patient stated that starting July 4th, she has been eating on the go. Patient expressed concern about not being able to monitor her sodium intake from eating out. Patient mentioned that in addition to monitoring her sodium intake, she is watching her sugar as well. Patient stated that she is practicing portion control by putting half of  her food in a takeout tray before starting to eat out.   Patient is finding ways to substitute sugar consumption. Patient stated that she has been practicing different strategies to reduce her sodium intake such as taking the top part of muffin off sandwich and reducing the frequency she goes to [employer] (once every two weeks recently). Patient shared that she has been drinking at least 20 oz of water per day and wants to increase it to 3-4 bottles per day.   Patient inquired about her referral to a Dermatologist per Dr. Oval Linsey.     Indicators of Success and Accountability:  Patient is attending PREP as scheduled and feels that her mind is more stable and can focus better.  Readiness: Patient is in the action phase of stress management and reducing sodium intake. Strengths and Supports: Patient is being supported by her family. Patient's self-awareness has increased. Challenges and Barriers: Patient being influenced by other's eating habits and being on the go could be a challenge to her reducing sodium consumption.   Coaching Outcomes: Patient expressed that she was glad for the 5:30pm reminder because it helps her to refocus on things that she should be doing for herself, and it helps her feel balanced.   Care Guide mailed patient a copy of the stress journal (ABCDE method) for her review to implement when she writes.   Patient stated that to help her make better choices when eating out is to look up the nutrients in the food on their menus. Patient expressed that she could have ordered something healthier instead of being influenced by others' eating behaviors. Patient also thought of bringing food with her or going to a different place to purchase healthier food options, or perhaps waiting up she gets home to eat.  Care Guide provided patient a copy of the nutrition facts for [employer] for her review.  Care Guide sent Dr. Oval Linsey a message regarding information provided by patient to refer to Dermatologist.   Patient encouragement to self is to be realistic about the things that she can do so that she can be successful at achieving her goals.   Patient feels that she is in a good place with her steps and has been moved to the one month follow up stage of health coaching.   Care Guide will mail patient copies of the sodium tracker so she can have all her information in one place.   Patient will continue to implement action steps as outlined above over the next month, except for walking at [place] 11:30am  -12:30pm (Mon, Wed, Fri of each week).    Attempted: Fulfilled - Patient has been reading food labels, following her eating schedule, attending PREP as scheduled, utilizing her support system, practicing her gratitude journaling during her set time each day, conducting check-ins, and implementing positive self-talk, and has established a hobby/service to engage in.  Not met - Patient has not walked at the [facility] in the past two weeks. Patient has not been able to monitor her sodium consumption with the tracker because she doesn't have physical copies.

## 2020-11-20 NOTE — Progress Notes (Signed)
[facility] PREP Weekly Session   Patient Details  Name: Jasmine Lopez MRN: [id number] Date of Birth: 07-Nov-1940 Age: 80 y.o. PCP: [name], FNP  Vitals:   11/20/20 1503  Weight: 187 lb (84.8 kg)     [facility] Weekly seesion - 11/20/20 1500       Weekly Session   Topic Discussed Health habits;Water    Minutes exercised this week 150 minutes    Classes attended to date 6            Class held at [facility] 11/20/2020, 3:05 PM

## 2020-11-21 ENCOUNTER — Telehealth: Payer: Self-pay

## 2020-11-21 NOTE — Telephone Encounter (Signed)
Patient called wanting [place] to resend her referral for Dermatology that [place] sent. Pt has not seen [place] for this matter so she needs an appt to come in [facility]

## 2020-11-23 ENCOUNTER — Telehealth (HOSPITAL_BASED_OUTPATIENT_CLINIC_OR_DEPARTMENT_OTHER): Payer: Self-pay | Admitting: *Deleted

## 2020-11-23 NOTE — Telephone Encounter (Signed)
-----   Message from Skeet Latch, MD sent at 11/16/2020 12:36 AM EDT ----- Regarding: RE: Referral to Dermatologist She can try Dr. Johny Blamer in [place] if she is willing to drive.   ----- Message ----- From: Avelino Leeds Sent: 11/06/2020   3:42 PM EDT To: Skeet Latch, MD Subject: Referral to Dermatologist                      Hi Dr. Oval Linsey,  This patient is interested in getting another referral to see a dermatologist. An office contacted her but will not be able to see her until four months out. Is there another office you can refer her to?  Thanks, Amy

## 2020-11-23 NOTE — Telephone Encounter (Signed)
Left message to call back  

## 2020-11-25 NOTE — Progress Notes (Deleted)
11/26/20- 50 yoF for sleep evaluation Remote hx sleep apnea Medical problem list includes HTN, DM2, GERD, Upper Airway Cough/ VCD, Memory Loss, Gout, Breast Cancer L, Diastolic Dysfunction,  Epworth score- Body weight today- Covid vax-

## 2020-11-26 ENCOUNTER — Ambulatory Visit: Payer: Medicare PPO | Admitting: Internal Medicine

## 2020-11-27 NOTE — Progress Notes (Signed)
[facility] PREP Weekly Session   Patient Details  Name: Jasmine Lopez MRN: [id number] Date of Birth: [birth year] Age: 80 y.o. PCP: [name], FNP  Vitals:   11/27/20 1552  Weight: 185 lb 6.4 oz (84.1 kg)     [facility] Weekly seesion - 11/27/20 1500       Weekly Session   Topic Discussed Restaurant Eating   Sugar demo   Minutes exercised this week 160 minutes    Classes attended to date 8            Class held at the [facility] 11/27/2020, 3:53 PM

## 2020-11-28 NOTE — Progress Notes (Addendum)
[facility] Note  11/29/2020     CHIEF COMPLAINT Patient presents for Diabetic Eye Exam   HISTORY OF PRESENT ILLNESS: Jasmine Lopez is a 80 y.o. female who presents to the clinic today for:   HPI     Diabetic Eye Exam   Vision fluctuates with blood sugars.  Diabetes characteristics include Type 2 and taking oral medications.  This started 6 years ago.  Blood sugar level fluctuates.  Last Blood Glucose 130.  Last A1C 6.9.  I, the attending physician,  performed the HPI with the patient and updated documentation appropriately.        Comments   1 year follow up DM II-  At times, vision is better than others.  This is normally due to blood sugar.  PCP wants her to be under 6.4.  She is at a 6.9.       Last edited by Bernarda Caffey, MD on 11/29/2020 10:49 AM.    Pts last A1c was 6.9 on 05.16.22, she is on 564m of metformin daily, no other health concerns    Referring physician: MMinette Brine [place]  [place] [id number] HISTORICAL INFORMATION:   Selected notes from the [place]erred by Dr. SBaird Cancerfor concern of diabetic retinopathy LEE:  Ocular Hx- PMH-   CURRENT MEDICATIONS: No current outpatient medications on file. (Ophthalmic Drugs)   No current facility-administered medications for this visit. (Ophthalmic Drugs)   Current Outpatient Medications (Other)  Medication Sig   acetaminophen (TYLENOL) 500 MG tablet Take 500 mg by mouth every 6 (six) hours as needed.   allopurinol (ZYLOPRIM) 100 MG tablet TAKE 1 TABLET BY MOUTH EVERY DAY   amLODipine (NORVASC) 10 MG tablet Take 1 tablet (10 mg total) by mouth daily.   Biotin 10000 MCG TBDP 1 tablet daily as needed.   buPROPion (WELLBUTRIN XL) 150 MG 24 hr tablet TAKE 1 TABLET(150 MG) BY MOUTH EVERY MORNING   carvedilol (COREG) 6.25 MG tablet Take 1 tablet (6.25 mg total) by mouth 2 (two) times daily with a meal.   Cholecalciferol (VITAMIN D3)  2000 units TABS Take by mouth.   Garlic 12993MG CAPS Take 1 capsule by mouth daily.   metFORMIN (GLUCOPHAGE-XR) 500 MG 24 hr tablet TAKE 1 TABLET BY MOUTH TWICE A DAY   rosuvastatin (CRESTOR) 20 MG tablet TAKE 1 TABLET(20 MG) BY MOUTH DAILY   valsartan-hydrochlorothiazide (DIOVAN-HCT) 160-25 MG tablet Take 1 tablet by mouth daily.   Accu-Chek Softclix Lancets lancets Use to check blood sugars twice daily E11.69   blood glucose meter kit and supplies KIT Dispense based on patient and insurance preference. Use up to two times daily as directed. (FOR ICD-9 250.00, 250.01).   glucose blood (ACCU-CHEK SMARTVIEW) test strip Use to check blood sugars twice daily E11.69   No current facility-administered medications for this visit. (Other)      REVIEW OF SYSTEMS: ROS   Positive for: Gastrointestinal, Neurological, Cardiovascular, Eyes, Respiratory, Psychiatric, Heme/Lymph Negative for: Constitutional, Skin, Genitourinary, Musculoskeletal, HENT, Endocrine, Allergic/Imm Last edited by HLeonie Douglas COA on 11/29/2020  9:21 AM.        ALLERGIES Allergies  Allergen Reactions   Ace Inhibitors Cough   Atorvastatin Other (See Comments)    Leg pain    Benadryl [Diphenhydramine] Itching    Benadryl cream   Peppermint Flavor [Flavoring Agent]     Causes horsenss    PAST MEDICAL HISTORY Past Medical History:  Diagnosis Date   Anxiety    Breast cancer ([place])    Cancer ([place])    Diabetes mellitus without complication ([place])    Dysphagia    GERD (gastroesophageal reflux disease) 10/12/2020   Hair loss 10/12/2020   Hx of adenomatous polyp of colon 06/2015   [name]- [facility]   Hypertension    Irregular heart beat    Sleep apnea    Past Surgical History:  Procedure Laterality Date   BREAST EXCISIONAL BIOPSY Bilateral    BREAST LUMPECTOMY Left 2016    FAMILY HISTORY Family History  Problem Relation Age of Onset   Heart failure Mother        CAD   COPD Mother     Hypertension Mother    Cancer Father    Rectal cancer Maternal Grandmother        Unsure   Colon cancer Maternal Grandmother    Heart attack Maternal Grandfather     SOCIAL HISTORY Social History   Tobacco Use   Smoking status: Never   Smokeless tobacco: Never  Vaping Use   Vaping Use: Never used  Substance Use Topics   Alcohol use: Never   Drug use: Never         OPHTHALMIC EXAM:  Base Eye Exam     Visual Acuity (Snellen - Linear)       Right Left   Dist cc 20/30 20/25 +1   Dist ph cc NI NI         Tonometry (Tonopen, 9:26 AM)       Right Left   Pressure 19 19         Pupils       Dark Light Shape React APD   Right 3 2 Round Brisk None   Left 3 2 Round Brisk None         Visual Fields (Counting fingers)       Left Right    Full Full         Extraocular Movement       Right Left    Full Full         Neuro/Psych     Oriented x3: Yes   Mood/Affect: Normal         Dilation     Both eyes: 1.0% Mydriacyl, 2.5% Phenylephrine @ 9:26 AM           Slit Lamp and Fundus Exam     Slit Lamp Exam       Right Left   Lids/Lashes Dermatochalasis - upper lid, Ptosis Dermatochalasis - upper lid   Conjunctiva/Sclera mild melanosis, white and quiet mild melanosis, white and quiet   Cornea mild arcus, trace PEE mild arcus, 1+ inferior PEE, mild tear film debris   Anterior Chamber deep and clear deep and clear   Iris round and dilated, no NVI round and dilated, no NVI   Lens 2-3+ Nuclear sclerosis, 2-3+ Cortical cataract 2-3+ Nuclear sclerosis, 2-3+ Cortical cataract   Vitreous syneresis, PVD syneresis, PVD         Fundus Exam       Right Left   Disc pink and sharp, mild cupping, mild PPA/PPP pink and sharp   C/D Ratio 0.6 0.55   Macula flat, good foveal reflex, mild RPE mottling and clumping, no heme or edema flat, blunted foveal reflex, mild RPE mottling and clumping, no heme or edema   Vessels Vascular attenuation, mild AV  crossing changes Vascular attenuation, mild AV  crossing changes   Periphery attached, focal pigment clump at 07:00, no heme attached, no heme            IMAGING AND PROCEDURES  Imaging and Procedures for '@TODAY' @  OCT, Retina - OU - Both Eyes       Right Eye Quality was good. Central Foveal Thickness: 225. Progression has been stable. Findings include normal foveal contour, no IRF, no SRF.   Left Eye Quality was good. Central Foveal Thickness: 229. Progression has been stable. Findings include normal foveal contour, no IRF, no SRF.   Notes *Images captured and stored on drive  Diagnosis / Impression:  OU: NFP, no IRF/SRF No DME OU  Clinical management:  See below  Abbreviations: NFP - Normal foveal profile. CME - cystoid macular edema. PED - pigment epithelial detachment. IRF - intraretinal fluid. SRF - subretinal fluid. EZ - ellipsoid zone. ERM - epiretinal membrane. ORA - outer retinal atrophy. ORT - outer retinal tubulation. SRHM - subretinal hyper-reflective material               ASSESSMENT/PLAN:    ICD-10-CM   1. Diabetes mellitus type 2 without retinopathy ([place])  E11.9     2. Retinal edema  H35.81 OCT, Retina - OU - Both Eyes    3. Essential hypertension  I10     4. Hypertensive retinopathy of both eyes  H35.033     5. Posterior vitreous detachment of both eyes  H43.813     6. Combined forms of age-related cataract of both eyes  H25.813     7. Dermatochalasis of both upper eyelids  H02.831    H02.834     8. Ptosis of eyelid, right  H02.401        1,2. Diabetes mellitus, type 2 without retinopathy OU -- stable  - last A1c 6.9% (05.16.22)  - The incidence, risk factors for progression, natural history and treatment options for diabetic retinopathy  were discussed with patient.    - The need for close monitoring of blood glucose, blood pressure, and serum lipids, avoiding cigarette or any type of tobacco, and the need for long term follow up was  also discussed with patient.   - f/u in 1 year, sooner prn  3,4. Hypertensive retinopathy OU  - discussed importance of tight BP control  - monitor   5. PVD / vitreous syneresis OU  - Discussed findings and prognosis  - No RT or RD on 360 peripheral exam  - Reviewed s/s of RT/RD  - Strict return precautions for any such RT/RD signs/symptoms   6. Mixed cataracts OU  - The symptoms of cataract, surgical options, and treatments and risks were discussed with patient.  - discussed diagnosis and progression   - Pt under the expert management of [place]  7,8. Dermatocholasis, bilateral upper lids; Ptosis OD  - Patient was referred to Sarajane Marek, MD ([facility] for eval) in 2021, but pt deferred consult  - pt wishes to be referred again   Ophthalmic Meds Ordered this visit:  No orders of the defined types were placed in this encounter.      Return in about 1 year (around 11/29/2021) for f/u DM exam, DFE, OCT.  There are no Patient Instructions on file for this visit.   Explained the diagnoses, plan, and follow up with the patient and they expressed understanding.  Patient expressed understanding of the importance of proper follow up care.   This document serves as a record of  services personally performed by Gardiner Sleeper, MD, PhD. It was created on their behalf by Leonie Douglas, an ophthalmic technician. The creation of this record is the provider's dictation and/or activities during the visit.    Electronically signed by: Leonie Douglas COA, 11/29/20  10:53 AM  Gardiner Sleeper, M.D., Ph.D. Diseases & Surgery of the Retina and [place] 11/29/2020  I have reviewed the above documentation for accuracy and completeness, and I agree with the above. Gardiner Sleeper, M.D., Ph.D. 11/29/20 10:53 AM  Abbreviations: M myopia (nearsighted); A astigmatism; H hyperopia (farsighted); P presbyopia; Mrx spectacle prescription;  CTL contact  lenses; OD right eye; OS left eye; OU both eyes  XT exotropia; ET esotropia; PEK punctate epithelial keratitis; PEE punctate epithelial erosions; DES dry eye syndrome; MGD meibomian gland dysfunction; ATs artificial tears; PFAT's preservative free artificial tears; [place] nuclear sclerotic cataract; PSC posterior subcapsular cataract; ERM epi-retinal membrane; PVD posterior vitreous detachment; RD retinal detachment; DM diabetes mellitus; DR diabetic retinopathy; NPDR non-proliferative diabetic retinopathy; PDR proliferative diabetic retinopathy; CSME clinically significant macular edema; DME diabetic macular edema; dbh dot blot hemorrhages; CWS cotton wool spot; POAG primary open angle glaucoma; C/D cup-to-disc ratio; HVF humphrey visual field; GVF goldmann visual field; OCT optical coherence tomography; IOP intraocular pressure; BRVO Branch retinal vein occlusion; CRVO central retinal vein occlusion; CRAO central retinal artery occlusion; BRAO branch retinal artery occlusion; RT retinal tear; SB scleral buckle; PPV pars plana vitrectomy; VH Vitreous hemorrhage; PRP panretinal laser photocoagulation; IVK intravitreal kenalog; VMT vitreomacular traction; MH Macular hole;  NVD neovascularization of the disc; NVE neovascularization elsewhere; AREDS age related eye disease study; ARMD age related macular degeneration; POAG primary open angle glaucoma; EBMD epithelial/anterior basement membrane dystrophy; ACIOL anterior chamber intraocular lens; IOL intraocular lens; PCIOL posterior chamber intraocular lens; Phaco/IOL phacoemulsification with intraocular lens placement; Canon photorefractive keratectomy; LASIK laser assisted in situ keratomileusis; HTN hypertension; DM diabetes mellitus; COPD chronic obstructive pulmonary disease

## 2020-11-29 ENCOUNTER — Ambulatory Visit (INDEPENDENT_AMBULATORY_CARE_PROVIDER_SITE_OTHER): Payer: Medicare PPO | Admitting: Ophthalmology

## 2020-11-29 ENCOUNTER — Other Ambulatory Visit: Payer: Self-pay

## 2020-11-29 ENCOUNTER — Encounter (INDEPENDENT_AMBULATORY_CARE_PROVIDER_SITE_OTHER): Payer: Self-pay | Admitting: Ophthalmology

## 2020-11-29 DIAGNOSIS — H3581 Retinal edema: Secondary | ICD-10-CM

## 2020-11-29 DIAGNOSIS — H43813 Vitreous degeneration, bilateral: Secondary | ICD-10-CM

## 2020-11-29 DIAGNOSIS — H35033 Hypertensive retinopathy, bilateral: Secondary | ICD-10-CM

## 2020-11-29 DIAGNOSIS — H25813 Combined forms of age-related cataract, bilateral: Secondary | ICD-10-CM | POA: Diagnosis not present

## 2020-11-29 DIAGNOSIS — E119 Type 2 diabetes mellitus without complications: Secondary | ICD-10-CM | POA: Diagnosis not present

## 2020-11-29 DIAGNOSIS — I10 Essential (primary) hypertension: Secondary | ICD-10-CM | POA: Diagnosis not present

## 2020-11-29 DIAGNOSIS — H02401 Unspecified ptosis of right eyelid: Secondary | ICD-10-CM

## 2020-11-29 DIAGNOSIS — H02831 Dermatochalasis of right upper eyelid: Secondary | ICD-10-CM

## 2020-11-29 DIAGNOSIS — H02834 Dermatochalasis of left upper eyelid: Secondary | ICD-10-CM | POA: Diagnosis not present

## 2020-12-04 NOTE — Progress Notes (Signed)
[facility] PREP Weekly Session   Patient Details  Name: Jasmine Lopez MRN: [id number] Date of Birth: 21-Apr-1941 Age: 80 y.o. PCP: [name], FNP  Vitals:   12/04/20 1531  Weight: 187 lb 3.2 oz (84.9 kg)     [facility] Weekly seesion - 12/04/20 1500       Weekly Session   Topic Discussed Stress management and problem solving   Meditation and chair yoga   Minutes exercised this week 170 minutes    Classes attended to date 10            Class held at [facility] 12/04/2020, 3:32 PM

## 2020-12-05 ENCOUNTER — Ambulatory Visit: Payer: Medicare PPO | Admitting: Internal Medicine

## 2020-12-05 ENCOUNTER — Encounter: Payer: Self-pay | Admitting: Internal Medicine

## 2020-12-05 ENCOUNTER — Other Ambulatory Visit: Payer: Self-pay

## 2020-12-05 DIAGNOSIS — M9902 Segmental and somatic dysfunction of thoracic region: Secondary | ICD-10-CM | POA: Diagnosis not present

## 2020-12-05 DIAGNOSIS — R058 Other specified cough: Secondary | ICD-10-CM | POA: Diagnosis not present

## 2020-12-05 DIAGNOSIS — S29012A Strain of muscle and tendon of back wall of thorax, initial encounter: Secondary | ICD-10-CM | POA: Diagnosis not present

## 2020-12-05 DIAGNOSIS — M47817 Spondylosis without myelopathy or radiculopathy, lumbosacral region: Secondary | ICD-10-CM | POA: Diagnosis not present

## 2020-12-05 DIAGNOSIS — M5136 Other intervertebral disc degeneration, lumbar region: Secondary | ICD-10-CM | POA: Diagnosis not present

## 2020-12-05 DIAGNOSIS — M9903 Segmental and somatic dysfunction of lumbar region: Secondary | ICD-10-CM | POA: Diagnosis not present

## 2020-12-05 DIAGNOSIS — M9901 Segmental and somatic dysfunction of cervical region: Secondary | ICD-10-CM | POA: Diagnosis not present

## 2020-12-05 DIAGNOSIS — M5032 Other cervical disc degeneration, mid-cervical region, unspecified level: Secondary | ICD-10-CM | POA: Diagnosis not present

## 2020-12-05 DIAGNOSIS — M9905 Segmental and somatic dysfunction of pelvic region: Secondary | ICD-10-CM | POA: Diagnosis not present

## 2020-12-05 NOTE — Assessment & Plan Note (Signed)
Onset ? p H1N1 but daily since fall [id number]  - cyclical cough rx [id number] with tessalon suppression  - 04/04/2019 referred to wfu/ [place]> rec speech therapy/ no change in rx - 04/15/2019 changed to pepcid 20 mg bid as no worse since stopped ppi on her own - 06/14/2019 completed ST rx [place] > marked improvement   Resolved at present/ no regular pulmonary f/u needed  In the event of flare needs to remember:  Of the three most common causes of  Sub-acute / recurrent or chronic cough, only one (GERD)  can actually contribute to/ trigger  the other two (asthma and post nasal drip syndrome)  and perpetuate the cylce of cough.  While not intuitively obvious, many patients with chronic low grade reflux do not cough until there is a primary insult that disturbs the protective epithelial barrier and exposes sensitive nerve endings.   This is typically viral but can due to PNDS and  either may apply here.   The point is that once this occurs, it is difficult to eliminate the cycle  using anything but a maximally effective acid suppression regimen at least in the short run, accompanied by an appropriate diet to address non acid GERD and control / eliminate pnds with 1st gen H1 blockers per guidelines  And f/u here prn          Each maintenance medication was reviewed in detail including emphasizing most importantly the difference between maintenance and prns and under what circumstances the prns are to be triggered using an action plan format where appropriate.  Total time for H and P, chart review, counseling,  and generating customized AVS unique to this summary final  office visit / same day charting = 25 min

## 2020-12-05 NOTE — Progress Notes (Addendum)
Jasmine Lopez, female    DOB: 11-03-1940,     MRN: [id number]   Brief patient profile:  89 yobf never smoker hay fever as child better p left [place] in her 1s and then started pattern of chronic cough after h1n1 and worse  fall 2018 so referred to pulmonary clinic 02/02/2019      History of Present Illness  02/02/2019  Pulmonary/ 1st office eval/Jasmine Lopez  Chief Complaint  Patient presents with   Pulmonary Consult    Self referral. Pt c/o cough x 2 yrs- non prod and occurs when she is exposed to certain smells and occ when she lies down.   cough is daily assoc hoarseness with ent eval 0/10/3014  wolicki  "neg" - no notes in care everywhere Dyspnea:  Can walk up to an hour  Cough: dry hack/ better while asleep and not waking her up / worse with smells certain foods with sense of globus worse immediately when lies down then resolves within  Few min and does not disturb sleep Sleep: sits up for a while immediately then later able to lie flat/ sleep fine SABA use: none  Prednisone helped / mint or water helps rec The key to effective treatment for your cough is eliminating   First take tessalon 200 mg up to every 6 hour until no cough at all x 3 straight days  Prednisone 10 mg take  4 each am x 2 days,   2 each am x 2 days,  1 each am x 2 days and stop (this is to eliminate allergies and inflammation from coughing) Protonix (pantoprazole) Take 30-60 min before first meal of the day and Pepcid 20 mg one bedtime plus chlorpheniramine 4 mg x1 at bedtime GERD (REFLUX)  Please schedule a follow up office visit in 4 weeks, sooner if needed  with all medications /inhalers/ solutions in hand so we can verify exactly what you are taking. This includes all medications from all doctors and over the counters      03/04/2019  f/u ov/Jasmine Lopez re:  cough x 2016 off ppi x two weeks Chief Complaint  Patient presents with   Follow-up    Cough is 50% better since the last visit.    Dyspnea:  Not limited  by breathing from desired activities   Cough: dry  Very sensitive to temp changes and perfume Sleeping: sleeps fine  SABA use: none  02: none  Has not tried 1st gen H1 blockers per recs   rec An hour before bed take pepcid 20 mg and chlorpheniramine 4 mg  For drainage / throat tickle try take CHLORPHENIRAMINE  4 mg  (Chlortab 37m     Please schedule a follow up office visit in 6 weeks, call sooner if needed with all medications /inhalers/ solutions in hand     04/15/2019  f/u ov/Jasmine Lopez re: cough x 2016 / did not bring any meds/not sure she has chlorpheniramine Chief Complaint  Patient presents with   Follow-up    Cough is some better. Still has some hoarseness.   Dyspnea:  Not limited by breathing from desired activities  But very limited with activity since covid Cough: resolved to her satisfaction/ just throat clearing / better with tessalon  Sleeping: fine /sleeping 30 degrees electric bed  SABA use: none 02: none  rec Change pepcid (famotdine) 20 mg after bfast and supper  Remember to keep the candy handy  Do not refill pantoprazole  For drainage / throat tickle try take  CHLORPHENIRAMINE  4 mg  (Chlortab 34m  at [employer] be easiest to find in the green box)  Please schedule a follow up visit in 6  months but call sooner if needed     11/01/2019  f/u ov/Jasmine Lopez re:  Cough x 2016  March 2nd shot pMedical illustratorComplaint  Patient presents with   Follow-up    pt is doing well. Denies any congestion,coughing or reflux.  Dyspnea:  Not limited by breathing from desired activities   Cough: better p ST / worse with voice use or smells or gets excited  Sleeping: no resp symptoms  SABA use: none  02: none  pepcid as needed  Rec Gerd diet/ bed blocks F/u prn   12/05/2020  f/u ov/Jasmine Lopez re: cough x 2016 c/w UACS on just prn 1st gen H1 blockers per guidelines   Chief Complaint  Patient presents with   Follow-up    Still has occ cough, speech therapy has helped her.  When she does cough it is non prod.    Dyspnea:  Not limited by breathing from desired activities   Cough: not at present  Sleeping: fine s gerd rx  SABA use: none  02: none  Covid status:   x 3    No obvious day to day or daytime variability or assoc excess/ purulent sputum or mucus plugs or hemoptysis or cp or chest tightness, subjective wheeze or overt sinus or hb symptoms.   Sleeeping  without nocturnal  or early am exacerbation  of respiratory  c/o's or need for noct saba. Also denies any obvious fluctuation of symptoms with weather or environmental changes or other aggravating or alleviating factors except as outlined above   No unusual exposure hx or h/o childhood pna/ asthma or knowledge of premature birth.  Current Allergies, Complete Past Medical History, Past Surgical History, Family History, and Social History were reviewed in [employer].  ROS  The following are not active complaints unless bolded Hoarseness, sore throat, dysphagia, dental problems, itching, sneezing,  nasal congestion or discharge of excess mucus or purulent secretions, ear ache,   fever, chills, sweats, unintended wt loss or wt gain, classically pleuritic or exertional cp,  orthopnea pnd or arm/hand swelling  or leg swelling, presyncope, palpitations, abdominal pain, anorexia, nausea, vomiting, diarrhea  or change in bowel habits or change in bladder habits, change in stools or change in urine, dysuria, hematuria,  rash, arthralgias, visual complaints, headache, numbness, weakness or ataxia or problems with walking or coordination,  change in mood or  memory.        Current Meds  Medication Sig   Accu-Chek Softclix Lancets lancets Use to check blood sugars twice daily E11.69   acetaminophen (TYLENOL) 500 MG tablet Take 500 mg by mouth every 6 (six) hours as needed.   allopurinol (ZYLOPRIM) 100 MG tablet TAKE 1 TABLET BY MOUTH EVERY DAY   amLODipine (NORVASC) 10 MG tablet Take 1  tablet (10 mg total) by mouth daily.   Biotin 10000 MCG TBDP 1 tablet daily as needed.   blood glucose meter kit and supplies KIT Dispense based on patient and insurance preference. Use up to two times daily as directed. (FOR ICD-9 250.00, 250.01).   buPROPion (WELLBUTRIN XL) 150 MG 24 hr tablet TAKE 1 TABLET(150 MG) BY MOUTH EVERY MORNING   carvedilol (COREG) 6.25 MG tablet Take 1 tablet (6.25 mg total) by mouth 2 (two) times daily with a meal.   Cholecalciferol (VITAMIN  D3) 2000 units TABS Take by mouth.   Garlic 2763 MG CAPS Take 1 capsule by mouth daily.   glucose blood (ACCU-CHEK SMARTVIEW) test strip Use to check blood sugars twice daily E11.69   metFORMIN (GLUCOPHAGE-XR) 500 MG 24 hr tablet TAKE 1 TABLET BY MOUTH TWICE A DAY   rosuvastatin (CRESTOR) 20 MG tablet TAKE 1 TABLET(20 MG) BY MOUTH DAILY   valsartan-hydrochlorothiazide (DIOVAN-HCT) 160-25 MG tablet Take 1 tablet by mouth daily.              Past Medical History:  Diagnosis Date   Diabetes mellitus without complication (HCC)    Hypertension    Irregular heart beat        Objective:     12/05/2020        184  11/01/2019        195 04/15/2019        202   03/04/19 196 lb (88.9 kg)  02/15/19 196 lb (88.9 kg)  02/02/19 197 lb 12.8 oz (89.7 kg)     Vital signs reviewed  12/05/2020  - Note at rest 02 sats  99% on RA   General appearance:    amb wf nad       HEENT : pt wearing mask not removed for exam due to covid -19 concerns.    NECK :  without JVD/Nodes/TM/ nl carotid upstrokes bilaterally   LUNGS: no acc muscle use,  Nl contour chest which is clear to A and P bilaterally without cough on insp or exp maneuvers   CV:  RRR  no s3  2/6 sem s  increase in P2, and no edema   ABD:  soft and nontender with nl inspiratory excursion in the supine position. No bruits or organomegaly appreciated, bowel sounds nl  MS:  Nl gait/ ext warm without deformities, calf tenderness, cyanosis or clubbing No obvious joint  restrictions   SKIN: warm and dry without lesions    NEURO:  alert, approp, nl sensorium with  no motor or cerebellar deficits apparent.              Assessment

## 2020-12-05 NOTE — Patient Instructions (Signed)
For drainage / throat tickle try take CHLORPHENIRAMINE  4 mg  (Chlortab '4mg'$   at [employer] should be easiest to find in the green box)  take one every 4 hours as needed - available over the counter- may cause drowsiness so start with just a dose or two an hour before bedtime and see how you tolerate it before trying in daytime    If cough flares, add pepcid over the counter 20 mg after bfast and supper until better for at least a week   If you are satisfied with your treatment plan,  let your doctor know and he/she can either refill your medications or you can return here when your prescription runs out.     If in any way you are not 100% satisfied,  please tell us.  If 100% better, tell your friends!  Pulmonary follow up is as needed

## 2020-12-07 DIAGNOSIS — H02423 Myogenic ptosis of bilateral eyelids: Secondary | ICD-10-CM | POA: Diagnosis not present

## 2020-12-07 DIAGNOSIS — H02413 Mechanical ptosis of bilateral eyelids: Secondary | ICD-10-CM | POA: Diagnosis not present

## 2020-12-07 DIAGNOSIS — H02411 Mechanical ptosis of right eyelid: Secondary | ICD-10-CM | POA: Diagnosis not present

## 2020-12-07 DIAGNOSIS — H02422 Myogenic ptosis of left eyelid: Secondary | ICD-10-CM | POA: Diagnosis not present

## 2020-12-07 DIAGNOSIS — I499 Cardiac arrhythmia, unspecified: Secondary | ICD-10-CM | POA: Insufficient documentation

## 2020-12-07 DIAGNOSIS — H0279 Other degenerative disorders of eyelid and periocular area: Secondary | ICD-10-CM | POA: Diagnosis not present

## 2020-12-07 DIAGNOSIS — H02831 Dermatochalasis of right upper eyelid: Secondary | ICD-10-CM | POA: Diagnosis not present

## 2020-12-07 DIAGNOSIS — H02412 Mechanical ptosis of left eyelid: Secondary | ICD-10-CM | POA: Diagnosis not present

## 2020-12-07 DIAGNOSIS — G4730 Sleep apnea, unspecified: Secondary | ICD-10-CM | POA: Insufficient documentation

## 2020-12-07 DIAGNOSIS — H02421 Myogenic ptosis of right eyelid: Secondary | ICD-10-CM | POA: Diagnosis not present

## 2020-12-07 DIAGNOSIS — H02834 Dermatochalasis of left upper eyelid: Secondary | ICD-10-CM | POA: Diagnosis not present

## 2020-12-11 NOTE — Progress Notes (Signed)
[employer] PREP Weekly Session   Patient Details  Name: Jasmine Lopez MRN: [id number] Date of Birth: 1941-01-09 Age: 80 y.o. PCP: [name], FNP  Vitals:   12/11/20 1544  Weight: 186 lb 3.2 oz (84.5 kg)     [employer] Weekly seesion - 12/11/20 1500       Weekly Session   Topic Discussed Expectations and non-scale victories    Minutes exercised this week 200 minutes    Classes attended to date 12            Class held at [facility] 12/11/2020, 3:45 PM

## 2020-12-12 ENCOUNTER — Encounter: Payer: Self-pay | Admitting: *Deleted

## 2020-12-12 NOTE — Telephone Encounter (Signed)
This encounter was created in error - please disregard.

## 2020-12-12 NOTE — Telephone Encounter (Signed)
-----   Message from Skeet Latch, MD sent at 12/10/2020  4:41 PM EDT ----- Regarding: FW: Referral to Dermatologist OK to refer. ----- Message ----- From: Avelino Leeds Sent: 11/20/2020  11:30 AM EDT To: Skeet Latch, MD Subject: RE: Referral to Dermatologist                  Hi Dr. Oval Linsey,  She called her insurance company and found that Dermatologist, Dr. Rupert Stacks, is within her network. She would like a referral to her office.   [phone] [place]  Thanks, Amy ----- Message ----- From: Skeet Latch, MD Sent: 11/16/2020  12:38 AM EDT To: Earvin Hansen, LPN, Amy Lee Subject: RE: Referral to Dermatologist                  She can try Dr. Johny Blamer in [place] if she is willing to drive.   ----- Message ----- From: Avelino Leeds Sent: 11/06/2020   3:42 PM EDT To: Skeet Latch, MD Subject: Referral to Dermatologist                      Hi Dr. Oval Linsey,  This patient is interested in getting another referral to see a dermatologist. An office contacted her but will not be able to see her until four months out. Is there another office you can refer her to?  Thanks, Amy

## 2020-12-12 NOTE — Telephone Encounter (Signed)
Skeet Latch, MD  Earvin Hansen, LPN OK to refer.         Previous Messages    ----- Message -----  From: Avelino Leeds  Sent: 11/20/2020  11:30 AM EDT  To: Skeet Latch, MD  Subject: RE: Referral to Dermatologist                   Hi Dr. Oval Linsey,   She called her insurance company and found that Dermatologist, Dr. Rupert Stacks, is within her network. She would like a referral to her office.   [phone]  [place]   Thanks,  Amy

## 2020-12-12 NOTE — Telephone Encounter (Signed)
Called Dr Ubaldo Glassing office and referral only needed if insurance requires.  Advised patient and if any issues getting appointment to call back, verbalized understanding

## 2020-12-17 ENCOUNTER — Ambulatory Visit: Payer: Medicare PPO

## 2020-12-17 NOTE — Progress Notes (Deleted)
Patient cancelled appointment. Sent email to reschedule appointment. Patient has been scheduled for an in-person session on August 16th at the [facility] location. Patient agreed in her email response.

## 2020-12-17 NOTE — Progress Notes (Signed)
Patient cancelled appointment. Sent email to reschedule appointment. Patient has been scheduled for an in-person session on August 16th at the [facility] location. Patient agreed in her email response.

## 2020-12-18 NOTE — Progress Notes (Signed)
[employer] PREP Weekly Session   Patient Details  Name: Jasmine Lopez MRN: [id number] Date of Birth: 07-Sep-1940 Age: 80 y.o. PCP: [name], FNP  Vitals:   12/18/20 1747  Weight: 186 lb 9.6 oz (84.6 kg)     [employer] Weekly seesion - 12/18/20 1700       Weekly Session   Topic Discussed --   Portions   Minutes exercised this week 135 minutes    Classes attended to date 14            Class held at [facility] 12/18/2020, 5:48 PM

## 2020-12-20 DIAGNOSIS — H5203 Hypermetropia, bilateral: Secondary | ICD-10-CM | POA: Diagnosis not present

## 2020-12-20 DIAGNOSIS — H02413 Mechanical ptosis of bilateral eyelids: Secondary | ICD-10-CM | POA: Diagnosis not present

## 2020-12-20 DIAGNOSIS — H25813 Combined forms of age-related cataract, bilateral: Secondary | ICD-10-CM | POA: Diagnosis not present

## 2020-12-20 DIAGNOSIS — E119 Type 2 diabetes mellitus without complications: Secondary | ICD-10-CM | POA: Diagnosis not present

## 2020-12-20 DIAGNOSIS — H40013 Open angle with borderline findings, low risk, bilateral: Secondary | ICD-10-CM | POA: Diagnosis not present

## 2020-12-20 LAB — HM DIABETES EYE EXAM

## 2020-12-24 ENCOUNTER — Ambulatory Visit (HOSPITAL_BASED_OUTPATIENT_CLINIC_OR_DEPARTMENT_OTHER): Payer: Medicare PPO

## 2020-12-25 ENCOUNTER — Ambulatory Visit (INDEPENDENT_AMBULATORY_CARE_PROVIDER_SITE_OTHER): Payer: Medicare PPO

## 2020-12-25 ENCOUNTER — Other Ambulatory Visit: Payer: Self-pay

## 2020-12-25 DIAGNOSIS — Z0000 Encounter for general adult medical examination without abnormal findings: Secondary | ICD-10-CM

## 2020-12-25 DIAGNOSIS — H53483 Generalized contraction of visual field, bilateral: Secondary | ICD-10-CM | POA: Diagnosis not present

## 2020-12-25 NOTE — Progress Notes (Signed)
Appointment Outcome: Completed, Session #: 1 month f/u Start time: 11:09am   End time: 11:49am   Total Mins: 40 minutes  AGREEMENTS SECTION   Overall Goal(s): Stress management Reduce sodium consumption                                            Agreement/Action Steps:   Reduce Sodium Consumption Read food labels to monitor sodium intake Monitor sodium consumption with sodium tracker Drink tea around 9am Eat breakfast 10:30am -11:00am Eat lunch by 3pm Eat dinner at 7pm   Stress Management Walking at [place] 11:30am -12:30pm (Mon, Wed, Fri of each week) PREP class T & Th Utilize support system Write in gratitude journal daily (3 things grateful for and/or 3 things was successful at doing)  Set an alarm for 5:30pm to start writing for 10-15 minutes  Conduct self-check-ins and implement positive self-talk Find a hobby  Progress Notes:  Patient is utilizing self-check-ins and positive self-talk. Patient stated that she talks to herself when she stumbles. Patient shared that she tells herself things like "get up and go walk" and "you can't stay in the house and do paperwork."  Patient stated that she has not been walking at the rec center yet because she is attending the PREP class on Tuesdays and Thursdays. Patient mentioned that in lieu of walking in the rec center she has been walking in the parking lot a lot or wherever she's at during that time. Patient also mentioned that she has been cleaning out a family house and helping her grandchildren move back into college.   Patient is being mindful of her writing time. When she is not at home the patient is mentally thinking about what she is grateful for or type it into her phone. Patient stated that it does this not matter where she is at 5:30pm, she is going to stop and think through her journal writing. Patient finds the alarm helpful, and others are asking her about what she is doing and tells her that it's a great idea. Patient  stated that this step keeps her motivated to keep a schedule and be focused.   Patient stated that she has gotten involved in church activities as her hobby. Patient recently joined a committee regarding low-income housing planning for new residents. Patient volunteered to make a welcome sign for and upcoming event for the new residents and will be participating in providing bag lunches. Patient stated that prior to health coaching and setting these goals, she was shunning away from people, places, and things.   Patient stated that sodium tracking is a challenge. Patient shared that she must remind herself to think before eating or hying to read food labels. Patient stated to help with reducing sodium she has cut down on bread (only use wheat bread, 45 calories, 1 slice at a time), not buying ice cream, and watching what she eats at restaurants. Patient stated that she eats in moderation now. Patient reported that when she eats at restaurants, she boxes up half her meal before eating. Patient shared that she has learned from PREP how to look for added sugars on food labels and she is minimizing her sugar consumption.   Patient stated that she is following her eating schedule when at home but finds that she eats about twice a day because she might not be home during a certain  period. Patient stated that she has also been following a diet plan that her son has recommended and now she doesn't know what is best for her.    Indicators of Success and Accountability:  Patient stated that exercising is her indicator of success and accountability which has helped improve her blood sugar and blood pressure.  Readiness: Patient is in the action stage of stress management and reducing sodium intake.  Strengths and Supports: Patient is being supported by family and friends. Patient is being more consistent and focused. Challenges and Barriers: Patient's challenges are not eating at home to be able to monitor her  sodium intake consistently.  Coaching Outcomes: Patient stated that Sept. 15 is the last day for PREP, and she plans to go the [facility] for classes in place of walking at the rec center from 11:30am - 12:30pm on Monday, Wednesday, and Friday.  Patient has decided that she will stick with her own eating plan so that she is in control of what she is eating.   Patient's takeaway from today's session is that she is more confident and don't always need a second opinion. Patient stated that she doesn't have the problem like before where she was not able to focus, and now can think and make decisions for herself.   Patient wants to increase her water consumption to 64 oz. per day. Patient stated that she will drink 48 oz. of plain bottled water (3-16 oz. bottles) and 2-8 oz. cups of morning/evening tea daily.   Agreement/Action Steps:   Reduce Sodium Consumption Read food labels to monitor sodium intake Monitor sodium consumption with sodium tracker Drink 2-8 oz. cups of tea daily  Drink 3-16 oz. bottles of water daily Eat breakfast 10:30am -11:00am Eat lunch by 3pm Eat dinner at 7pm    Stress Management PREP class T & Th Utilize support system Write in gratitude journal daily (3 things grateful for and/or 3 things was successful at doing)  Set an alarm for 5:30pm to start writing for 10-15 minutes  Conduct self-check-ins and implement positive self-talk Engage in church activities   Attempted: Fulfilled - Patient is reading food labels, attending PREP, utilizing support, journaling daily, conducting check-ins and implementing positive self-talk, and has found a hobby.  Partial - Patient is following meal schedule but eats twice a day. Patient has not been able to track her sodium consumption from eating out but does when eating at home.  Not met - Patient has not walked in rec center as agreed upon.   Not Attempted: Dropped/Revised - Walking at [facility] 11:30am -12:30pm (Mon,  Wed, Fri of each week)

## 2020-12-25 NOTE — Progress Notes (Signed)
[employer] PREP Weekly Session   Patient Details  Name: Jasmine Lopez MRN: [id number] Date of Birth: 12/25/1940 Age: 80 y.o. PCP: [name], FNP  Vitals:   12/25/20 1658  Weight: 186 lb 9.6 oz (84.6 kg)     [employer] Weekly seesion - 12/25/20 1600       Weekly Session   Topic Discussed Finding support    Minutes exercised this week 150 minutes    Classes attended to date 16            Class held at [facility] 12/25/2020, 4:59 PM

## 2020-12-26 ENCOUNTER — Encounter: Payer: Medicare PPO | Admitting: Nurse Practitioner

## 2020-12-26 ENCOUNTER — Ambulatory Visit (INDEPENDENT_AMBULATORY_CARE_PROVIDER_SITE_OTHER): Payer: Medicare PPO | Admitting: Nurse Practitioner

## 2020-12-26 ENCOUNTER — Encounter: Payer: Self-pay | Admitting: Nurse Practitioner

## 2020-12-26 VITALS — BP 116/78 | HR 62 | Temp 97.5°F | Ht 60.3 in | Wt 186.0 lb

## 2020-12-26 DIAGNOSIS — R8290 Unspecified abnormal findings in urine: Secondary | ICD-10-CM | POA: Diagnosis not present

## 2020-12-26 DIAGNOSIS — E785 Hyperlipidemia, unspecified: Secondary | ICD-10-CM

## 2020-12-26 DIAGNOSIS — Z0000 Encounter for general adult medical examination without abnormal findings: Secondary | ICD-10-CM

## 2020-12-26 DIAGNOSIS — E782 Mixed hyperlipidemia: Secondary | ICD-10-CM | POA: Diagnosis not present

## 2020-12-26 DIAGNOSIS — I10 Essential (primary) hypertension: Secondary | ICD-10-CM | POA: Diagnosis not present

## 2020-12-26 DIAGNOSIS — Z853 Personal history of malignant neoplasm of breast: Secondary | ICD-10-CM

## 2020-12-26 DIAGNOSIS — L659 Nonscarring hair loss, unspecified: Secondary | ICD-10-CM

## 2020-12-26 DIAGNOSIS — E1169 Type 2 diabetes mellitus with other specified complication: Secondary | ICD-10-CM | POA: Diagnosis not present

## 2020-12-26 LAB — POCT UA - MICROALBUMIN
Albumin/Creatinine Ratio, Urine, POC: <30
Creatinine, POC: 200 mg/dL
Microalbumin Ur, POC: 10 mg/L

## 2020-12-26 LAB — POCT URINALYSIS DIPSTICK
Bilirubin, UA: NEGATIVE
Blood, UA: NEGATIVE
Glucose, UA: NEGATIVE
Ketones, UA: NEGATIVE
Nitrite, UA: NEGATIVE
Protein, UA: NEGATIVE
Spec Grav, UA: 1.02 (ref 1.010–1.025)
Urobilinogen, UA: 0.2 E.U./dL
pH, UA: 7 (ref 5.0–8.0)

## 2020-12-26 NOTE — Progress Notes (Signed)
I,Katawbba Wiggins,acting as a Education administrator for [employer], FNP.,have documented all relevant documentation on the behalf of [name], FNP,as directed by  [name], FNP while in the presence of [name], [place].   This visit occurred during the SARS-CoV-2 public health emergency.  Safety protocols were in place, including screening questions prior to the visit, additional usage of staff PPE, and extensive cleaning of exam room while observing appropriate contact time as indicated for disinfecting solutions.  Subjective:     Patient ID: Jasmine Lopez , female    DOB: [birth year] , 80 y.o.   MRN: [id number]   Chief Complaint  Patient presents with   Annual Exam    HPI  The patient is here today for a physical examination. She has missed her amlodipine due to the pharmacy closing and the medication was transferred to another pharmacy. She had noticed her blood pressure started going up.   Other This is a chronic problem. Pertinent negatives include no chest pain.    Past Medical History:  Diagnosis Date   Anxiety    Breast cancer ([place])    Cancer ([place])    Diabetes mellitus without complication ([place])    Dysphagia    GERD (gastroesophageal reflux disease) 10/12/2020   Hair loss 10/12/2020   Hx of adenomatous polyp of colon 06/2015   [name]- [facility]   Hypertension    Irregular heart beat    Sleep apnea      Family History  Problem Relation Age of Onset   Heart failure Mother        CAD   COPD Mother    Hypertension Mother    Cancer Father    Rectal cancer Maternal Grandmother        Unsure   Colon cancer Maternal Grandmother    Heart attack Maternal Grandfather      Current Outpatient Medications:    Accu-Chek Softclix Lancets lancets, Use to check blood sugars twice daily E11.69, Disp: 100 each, Rfl: 2   acetaminophen (TYLENOL) 500 MG tablet, Take 500 mg by mouth every 6 (six) hours as needed., Disp: , Rfl:    allopurinol (ZYLOPRIM) 100 MG tablet,  TAKE 1 TABLET BY MOUTH EVERY DAY, Disp: 90 tablet, Rfl: 0   amLODipine (NORVASC) 10 MG tablet, Take 1 tablet (10 mg total) by mouth daily., Disp: 90 tablet, Rfl: 2   Biotin 10000 MCG TBDP, 1 tablet daily., Disp: , Rfl:    blood glucose meter kit and supplies KIT, Dispense based on patient and insurance preference. Use up to two times daily as directed. (FOR ICD-9 250.00, 250.01)., Disp: 1 each, Rfl: 0   buPROPion (WELLBUTRIN XL) 150 MG 24 hr tablet, TAKE 1 TABLET(150 MG) BY MOUTH EVERY MORNING, Disp: 90 tablet, Rfl: 1   carvedilol (COREG) 6.25 MG tablet, Take 1 tablet (6.25 mg total) by mouth 2 (two) times daily with a meal., Disp: 180 tablet, Rfl: 2   Cholecalciferol (VITAMIN D3) 2000 units TABS, Take by mouth., Disp: , Rfl:    Garlic 0350 MG CAPS, Take 1 capsule by mouth daily., Disp: , Rfl:    glucose blood (ACCU-CHEK SMARTVIEW) test strip, Use to check blood sugars twice daily E11.69, Disp: 100 each, Rfl: 2   metFORMIN (GLUCOPHAGE-XR) 500 MG 24 hr tablet, TAKE 1 TABLET BY MOUTH TWICE A DAY (Patient taking differently: Once daily), Disp: 180 tablet, Rfl: 1   rosuvastatin (CRESTOR) 20 MG tablet, TAKE 1 TABLET(20 MG) BY MOUTH DAILY, Disp: 90 tablet,  Rfl: 3   valsartan-hydrochlorothiazide (DIOVAN-HCT) 160-25 MG tablet, Take 1 tablet by mouth daily., Disp: 90 tablet, Rfl: 1   Allergies  Allergen Reactions   Ace Inhibitors Cough   Atorvastatin Other (See Comments)    Leg pain    Benadryl [Diphenhydramine] Itching    Benadryl cream   Peppermint Flavor [Flavoring Agent]     Causes horsenss      The patient states she uses post menopausal status for birth control. Last LMP was No LMP recorded. Patient is postmenopausal.. Negative for Dysmenorrhea and Negative for Menorrhagia. Negative for: breast discharge, breast lump(s), breast pain and breast self exam. Associated symptoms include abnormal vaginal bleeding. Pertinent negatives include abnormal bleeding (hematology), anxiety, decreased libido,  depression, difficulty falling sleep, dyspareunia, history of infertility, nocturia, sexual dysfunction, sleep disturbances, urinary incontinence, urinary urgency, vaginal discharge and vaginal itching. Diet regular.The patient states her exercise level is moderate at [place] with a program that started in June. She is walking at the center near her house once a week. She is going to Dr. Blenda Mounts nurse therapist - talking about diet and her mental health.   The patient's tobacco use is:  Social History   Tobacco Use  Smoking Status Never  Smokeless Tobacco Never  . She has been exposed to passive smoke. The patient's alcohol use is:  Social History   Substance and Sexual Activity  Alcohol Use Never     Review of Systems  Constitutional: Negative.   HENT: Negative.    Eyes: Negative.   Respiratory: Negative.    Cardiovascular: Negative.  Negative for chest pain, palpitations and leg swelling.  Gastrointestinal: Negative.   Endocrine: Negative.   Genitourinary: Negative.   Musculoskeletal: Negative.   Skin: Negative.        Hair loss  Allergic/Immunologic: Negative.   Neurological: Negative.   Hematological: Negative.   Psychiatric/Behavioral: Negative.      Today's Vitals   12/26/20 0955  BP: 116/78  Pulse: 62  Temp: (!) 97.5 F (36.4 C)  TempSrc: Oral  Weight: 186 lb (84.4 kg)  Height: 5' 0.3" (1.532 m)   Body mass index is 35.97 kg/m.   Objective:  Physical Exam Constitutional:      General: She is not in acute distress.    Appearance: Normal appearance. She is well-developed. She is obese.  HENT:     Head: Normocephalic and atraumatic.     Right Ear: Hearing, tympanic membrane, ear canal and external ear normal. There is no impacted cerumen.     Left Ear: Hearing, tympanic membrane, ear canal and external ear normal. There is no impacted cerumen.     Nose:     Comments: Deferred - masked    Mouth/Throat:     Comments: Deferred - masked Eyes:      General: Lids are normal.     Extraocular Movements: Extraocular movements intact.     Conjunctiva/sclera: Conjunctivae normal.     Pupils: Pupils are equal, round, and reactive to light.     Funduscopic exam:    Right eye: No papilledema.        Left eye: No papilledema.  Neck:     Thyroid: No thyroid mass.     Vascular: No carotid bruit.  Cardiovascular:     Rate and Rhythm: Normal rate and regular rhythm.     Pulses: Normal pulses.     Heart sounds: Normal heart sounds. No murmur heard. Pulmonary:     Effort: Pulmonary effort is normal.  Breath sounds: Normal breath sounds.  Chest:     Chest wall: No mass.  Breasts:    Tanner Score is 5.     Right: Normal. No mass or tenderness.     Left: Normal. No mass or tenderness.  Abdominal:     General: Abdomen is flat. Bowel sounds are normal. There is no distension.     Palpations: Abdomen is soft.     Tenderness: There is no abdominal tenderness.  Genitourinary:    Rectum: Guaiac result negative.  Musculoskeletal:        General: No swelling. Normal range of motion.     Cervical back: Full passive range of motion without pain, normal range of motion and neck supple.     Right lower leg: No edema.     Left lower leg: No edema.  Lymphadenopathy:     Upper Body:     Right upper body: No supraclavicular, axillary or pectoral adenopathy.     Left upper body: No supraclavicular, axillary or pectoral adenopathy.  Skin:    General: Skin is warm and dry.     Capillary Refill: Capillary refill takes less than 2 seconds.  Neurological:     General: No focal deficit present.     Mental Status: She is alert and oriented to person, place, and time.     Cranial Nerves: No cranial nerve deficit.     Sensory: No sensory deficit.  Psychiatric:        Mood and Affect: Mood normal.        Behavior: Behavior normal.        Thought Content: Thought content normal.        Judgment: Judgment normal.        Assessment And Plan:     1.  Routine medical exam Behavior modifications discussed and diet history reviewed.   Pt will continue to exercise regularly and modify diet with low GI, plant based foods and decrease intake of processed foods.  Recommend intake of daily multivitamin, Vitamin D, and calcium.  Recommend mammogram and colonoscopy for preventive screenings, as well as recommend immunizations that include influenza, TDAP  - CBC - Culture, Urine  2. Type 2 diabetes mellitus with hyperlipidemia (HCC) Chronic, controlled Continue with current medications Encouraged to limit intake of sugary foods and drinks Encouraged to increase physical activity to 150 minutes per week Diabetic foot exam done, no abnormal findings Eye exam is up to date - POCT Urinalysis Dipstick (81002) - POCT UA - Microalbumin - Hemoglobin A1c - CMP14+EGFR  3. Essential hypertension B/P is controlled.  CMP ordered to check renal function.  The importance of regular exercise and dietary modification was stressed to the patient.  EKG done at Dr. Oval Linsey  4. Mixed hyperlipidemia Chronic, controlled Continue with current medications, tolerating statins well  - Lipid panel  5. Hair loss She is to follow up with Dermatology I have also given her the number to a trichologist  6. Abnormal urine White cells in urine will send urine for a culture  7. History of breast cancer  Long discussion about her hair loss and has an upcoming appt with Dermatology in January. She is calling daily to see if there is a cancellation. I have also given her information for a Trichologist for an evaluation.   Patient was given opportunity to ask questions. Patient verbalized understanding of the plan and was able to repeat key elements of the plan. All questions were answered to their satisfaction.  [name], FNP   I, [name], FNP, have reviewed all documentation for this visit. The documentation on 12/26/20 for the exam, diagnosis,  procedures, and orders are all accurate and complete.  THE PATIENT IS ENCOURAGED TO PRACTICE SOCIAL DISTANCING DUE TO THE COVID-19 PANDEMIC.

## 2020-12-26 NOTE — Patient Instructions (Addendum)
Health Maintenance, Female Adopting a healthy lifestyle and getting preventive care are important in promoting health and wellness. Ask your health care provider about: The right schedule for you to have regular tests and exams. Things you can do on your own to prevent diseases and keep yourself healthy. What should I know about diet, weight, and exercise? Eat a healthy diet  Eat a diet that includes plenty of vegetables, fruits, low-fat dairy products, and lean protein. Do not eat a lot of foods that are high in solid fats, added sugars, or sodium.  Maintain a healthy weight Body mass index (BMI) is used to identify weight problems. It estimates body fat based on height and weight. Your health care provider can help determineyour BMI and help you achieve or maintain a healthy weight. Get regular exercise Get regular exercise. This is one of the most important things you can do for your health. Most adults should: Exercise for at least 150 minutes each week. The exercise should increase your heart rate and make you sweat (moderate-intensity exercise). Do strengthening exercises at least twice a week. This is in addition to the moderate-intensity exercise. Spend less time sitting. Even light physical activity can be beneficial. Watch cholesterol and blood lipids Have your blood tested for lipids and cholesterol at 78 years of age, then havethis test every 5 years. Have your cholesterol levels checked more often if: Your lipid or cholesterol levels are high. You are older than 78 years of age. You are at high risk for heart disease. What should I know about cancer screening? Depending on your health history and family history, you may need to have cancer screening at various ages. This may include screening for: Breast cancer. Cervical cancer. Colorectal cancer. Skin cancer. Lung cancer. What should I know about heart disease, diabetes, and high blood pressure? Blood pressure and heart  disease High blood pressure causes heart disease and increases the risk of stroke. This is more likely to develop in people who have high blood pressure readings, are of African descent, or are overweight. Have your blood pressure checked: Every 3-5 years if you are 18-39 years of age. Every year if you are 40 years old or older. Diabetes Have regular diabetes screenings. This checks your fasting blood sugar level. Have the screening done: Once every three years after age 40 if you are at a normal weight and have a low risk for diabetes. More often and at a younger age if you are overweight or have a high risk for diabetes. What should I know about preventing infection? Hepatitis B If you have a higher risk for hepatitis B, you should be screened for this virus. Talk with your health care provider to find out if you are at risk forhepatitis B infection. Hepatitis C Testing is recommended for: Everyone born from 1945 through 1965. Anyone with known risk factors for hepatitis C. Sexually transmitted infections (STIs) Get screened for STIs, including gonorrhea and chlamydia, if: You are sexually active and are younger than 78 years of age. You are older than 78 years of age and your health care provider tells you that you are at risk for this type of infection. Your sexual activity has changed since you were last screened, and you are at increased risk for chlamydia or gonorrhea. Ask your health care provider if you are at risk. Ask your health care provider about whether you are at high risk for HIV. Your health care provider may recommend a prescription medicine to help   prevent HIV infection. If you choose to take medicine to prevent HIV, you should first get tested for HIV. You should then be tested every 3 months for as long as you are taking the medicine. Pregnancy If you are about to stop having your period (premenopausal) and you may become pregnant, seek counseling before you get  pregnant. Take 400 to 800 micrograms (mcg) of folic acid every day if you become pregnant. Ask for birth control (contraception) if you want to prevent pregnancy. Osteoporosis and menopause Osteoporosis is a disease in which the bones lose minerals and strength with aging. This can result in bone fractures. If you are 55 years old or older, or if you are at risk for osteoporosis and fractures, ask your health care provider if you should: Be screened for bone loss. Take a calcium or vitamin D supplement to lower your risk of fractures. Be given hormone replacement therapy (HRT) to treat symptoms of menopause. Follow these instructions at home: Lifestyle Do not use any products that contain nicotine or tobacco, such as cigarettes, e-cigarettes, and chewing tobacco. If you need help quitting, ask your health care provider. Do not use street drugs. Do not share needles. Ask your health care provider for help if you need support or information about quitting drugs. Alcohol use Do not drink alcohol if: Your health care provider tells you not to drink. You are pregnant, may be pregnant, or are planning to become pregnant. If you drink alcohol: Limit how much you use to 0-1 drink a day. Limit intake if you are breastfeeding. Be aware of how much alcohol is in your drink. In the [place] one drink equals one 12 oz bottle of beer (355 mL), one 5 oz glass of wine (148 mL), or one 1 oz glass of hard liquor (44 mL). General instructions Schedule regular health, dental, and eye exams. Stay current with your vaccines. Tell your health care provider if: You often feel depressed. You have ever been abused or do not feel safe at home. Summary Adopting a healthy lifestyle and getting preventive care are important in promoting health and wellness. Follow your health care provider's instructions about healthy diet, exercising, and getting tested or screened for diseases. Follow your health care provider's  instructions on monitoring your cholesterol and blood pressure. This information is not intended to replace advice given to you by your health care provider. Make sure you discuss any questions you have with your healthcare provider. Document Revised: 04/21/2018 Document Reviewed: 04/21/2018 Elsevier Patient Education  2022 [place] by Sondra Barges - [phone] - Sondra Barges is a Corporate treasurer in [place].

## 2020-12-27 LAB — CMP14+EGFR
ALT: 18 IU/L (ref 0–32)
AST: 18 IU/L (ref 0–40)
Albumin/Globulin Ratio: 1.9 (ref 1.2–2.2)
Albumin: 4.5 g/dL (ref 3.7–4.7)
Alkaline Phosphatase: 70 IU/L (ref 44–121)
BUN/Creatinine Ratio: 16 (ref 12–28)
BUN: 15 mg/dL (ref 8–27)
Bilirubin Total: 0.4 mg/dL (ref 0.0–1.2)
CO2: 27 mmol/L (ref 20–29)
Calcium: 9.5 mg/dL (ref 8.7–10.3)
Chloride: 100 mmol/L (ref 96–106)
Creatinine, Ser: 0.95 mg/dL (ref 0.57–1.00)
Globulin, Total: 2.4 g/dL (ref 1.5–4.5)
Glucose: 121 mg/dL — ABNORMAL HIGH (ref 65–99)
Potassium: 4.1 mmol/L (ref 3.5–5.2)
Sodium: 138 mmol/L (ref 134–144)
Total Protein: 6.9 g/dL (ref 6.0–8.5)
eGFR: 61 mL/min/{1.73_m2} (ref >59)

## 2020-12-27 LAB — HEMOGLOBIN A1C
Est. average glucose Bld gHb Est-mCnc: 163 mg/dL
Hgb A1c MFr Bld: 7.3 % — ABNORMAL HIGH (ref 4.8–5.6)

## 2020-12-27 LAB — CBC
Hematocrit: 42.4 % (ref 34.0–46.6)
Hemoglobin: 14 g/dL (ref 11.1–15.9)
MCH: 29.7 pg (ref 26.6–33.0)
MCHC: 33 g/dL (ref 31.5–35.7)
MCV: 90 fL (ref 79–97)
Platelets: 213 10*3/uL (ref 150–450)
RBC: 4.72 x10E6/uL (ref 3.77–5.28)
RDW: 12.7 % (ref 11.7–15.4)
WBC: 5.2 10*3/uL (ref 3.4–10.8)

## 2020-12-27 LAB — LIPID PANEL
Chol/HDL Ratio: 2.4 ratio (ref 0.0–4.4)
Cholesterol, Total: 134 mg/dL (ref 100–199)
HDL: 55 mg/dL (ref >39)
LDL Chol Calc (NIH): 68 mg/dL (ref 0–99)
Triglycerides: 49 mg/dL (ref 0–149)
VLDL Cholesterol Cal: 11 mg/dL (ref 5–40)

## 2020-12-28 LAB — URINE CULTURE

## 2021-01-01 NOTE — Progress Notes (Signed)
[facility] PREP Weekly Session   Patient Details  Name: Jasmine Lopez MRN: [id number] Date of Birth: 07/05/1940 Age: 80 y.o. PCP: [name], FNP  Vitals:   01/01/21 1515  Weight: 182 lb 9.6 oz (82.8 kg)     [facility] Weekly seesion - 01/01/21 1500       Weekly Session   Topic Discussed Calorie breakdown    Minutes exercised this week 170 minutes    Classes attended to date 18            Class at [facility] 01/01/2021, 3:16 PM

## 2021-01-02 DIAGNOSIS — M9903 Segmental and somatic dysfunction of lumbar region: Secondary | ICD-10-CM | POA: Diagnosis not present

## 2021-01-02 DIAGNOSIS — M47817 Spondylosis without myelopathy or radiculopathy, lumbosacral region: Secondary | ICD-10-CM | POA: Diagnosis not present

## 2021-01-02 DIAGNOSIS — M9902 Segmental and somatic dysfunction of thoracic region: Secondary | ICD-10-CM | POA: Diagnosis not present

## 2021-01-02 DIAGNOSIS — M5032 Other cervical disc degeneration, mid-cervical region, unspecified level: Secondary | ICD-10-CM | POA: Diagnosis not present

## 2021-01-02 DIAGNOSIS — M9901 Segmental and somatic dysfunction of cervical region: Secondary | ICD-10-CM | POA: Diagnosis not present

## 2021-01-02 DIAGNOSIS — M5136 Other intervertebral disc degeneration, lumbar region: Secondary | ICD-10-CM | POA: Diagnosis not present

## 2021-01-02 DIAGNOSIS — S29012A Strain of muscle and tendon of back wall of thorax, initial encounter: Secondary | ICD-10-CM | POA: Diagnosis not present

## 2021-01-02 DIAGNOSIS — M9905 Segmental and somatic dysfunction of pelvic region: Secondary | ICD-10-CM | POA: Diagnosis not present

## 2021-01-07 ENCOUNTER — Telehealth: Payer: Self-pay | Admitting: Gastroenterology

## 2021-01-07 NOTE — Telephone Encounter (Signed)
Inbound call from patient. Requesting new instructions for her procedure 9/1. States she will pick them up

## 2021-01-07 NOTE — Progress Notes (Signed)
Patient rescheduled - opened in error

## 2021-01-07 NOTE — Telephone Encounter (Signed)
Left detailed message for patient letting her know that I have placed the updated instructions at the receptionist desk on the third floor. She was advised that the receptionist do leave at 4:30 pm daily. She is aware that she can stop by at her convenience. Advised patient to call back if she has any questions.

## 2021-01-08 ENCOUNTER — Other Ambulatory Visit: Payer: Self-pay

## 2021-01-08 ENCOUNTER — Ambulatory Visit (INDEPENDENT_AMBULATORY_CARE_PROVIDER_SITE_OTHER): Payer: Medicare PPO

## 2021-01-08 DIAGNOSIS — Z0000 Encounter for general adult medical examination without abnormal findings: Secondary | ICD-10-CM

## 2021-01-08 NOTE — Progress Notes (Signed)
[facility] PREP Weekly Session   Patient Details  Name: Jasmine Lopez MRN: [id number] Date of Birth: [birth year] Age: 80 y.o. PCP: [name], FNP  Vitals:   01/08/21 1438  Weight: 184 lb 6.4 oz (83.6 kg)     [facility] Weekly seesion - 01/08/21 1400       Weekly Session   Topic Discussed Hitting roadblocks    Minutes exercised this week 180 minutes    Classes attended to date 20            Class at [facility] 01/08/2021, 2:39 PM

## 2021-01-08 NOTE — Progress Notes (Signed)
Appointment Outcome:  Completed, Session #: 1 Start time: 10:40am  End time: 11:19am   Total Mins: 39 minutes  AGREEMENTS SECTION   Overall Goal(s): Stress management Reduce sodium consumption   Agreement/Action Steps:   Reduce Sodium Consumption Read food labels to monitor sodium intake Monitor sodium consumption with sodium tracker Drink 2-8 oz. cups of tea daily  Drink 3-16 oz. bottles of water daily Eat breakfast 10:30am -11:00am Eat lunch by 3pm Eat dinner at 7pm     Stress Management PREP class T & Th Utilize support system Write in gratitude journal daily (3 things grateful for and/or 3 things was successful at doing)  Set an alarm for 5:30pm to start writing for 10-15 minutes  Conduct self-check-ins and implement positive self-talk Engage in church activities  Progress Notes:  Patient was called at the scheduled time, but had arrived in person to [place]. Patient was confused because she was informed that the Care Guide was not at that location. Care Guide waited for patient to check in, but patient requested a phone call instead.   Patient expressed concern about her A1C and LDL being elevated. Patient stated that she thought she was doing all the right things by reading food labels to monitor her sodium, sugar and carbohydrate intake. Patient shared that she has been evaluating her diet. Patient stated that she doesn't eat red meat, but have been eating a lot of shrimp, hot dogs and chicken.   Patient stated that she is going back to her food schedule because following her son's diet has gotten her confused with her eating times. Patient stated that she is not eating past 7:00pm. Patient has been monitoring her sodium intake and realizes that she cannot effectively manage her sodium consumption by eating out frequently and not knowing how much sodium is in the food. Patient shared that she reviewed her checkbook for how often she eats out and found that it is twice as  much than before. Patient stated that being around family has aided in the increase.  Patient stated that she has been sticking to drinking her water daily. Patient stated that she takes out her three (16 oz.) bottles at night to have ready for the next day. Patient is drinking one to two mugs (16 oz.) of tea per day.   Patient is conducting self-check-ins and implementing positive self-talk. Patient shared that she recognized how she would think negative of herself when she made a mistake or took someone else's answer as the right one over hers instead of being assertive. Patient mentioned the confusion that happened around her appointment this morning as an example. Patient stated that she started to get down on herself for mixing things up, but immediately told herself that she was not going to let it get to her.   Patient reported that she has not missed a PREP class yet. Patient stated that the program ends on September 15. Patient shared that her and three other people from class when to the [facility] to a swim class and walked 30 minutes together. Patient stated that they have agreed to support one another and attend various classes. Patient stated this was good because she had gotten away from wanting to go to the rec center while in PREP.   Patient still engage in her way of journaling when she is not near paper and pen. Patient stated that wherever she is, she adheres to the time when the alarm goes off at 5:30pm. Patient shared this  experience of a mental gratitude journaling process she goes through when putting her notes in her phone with her sister-in-law. Patient's sister-in-law encouraged her to continue because it was a great idea and she is going to try it as well. Patient stated that this step has helped strengthen her self-esteem and confidence.   Patient has been looking for ideas for the church sign that she volunteered to create for the event to welcome neighbors.    Indicators  of Success and Accountability:  Patient stated that joining the group at the swim class and walking with others is her indicator of success and accountability. Readiness: Patient is in the action stage of stress management and reducing sodium from her diet.  Strengths and Supports: Patient is being supported by family and friends. Patient strengths are increased confidence, being open-minded, taking initiative and remaining determined.  Challenges and Barriers: Patient does not foresee any challenges over the next two weeks in implementing her action steps.   Coaching Outcomes: Patient stated that she was going to remove fried foods from her diet. Patient stated that she will start baking her food again.   Patient will limit eating out to only weekends and cook Monday - Friday.   Patient plans to set up boundaries around eating out with family and friends so that they are making healthier choices or avoid eating out each weekend.   Patient plans to eat at home before leaving the house, to help reduce the temptation of eating fast food. Patient plans to remove bacon from her diet.  Care Guide mailed and emailed patient copies of a meal planner sheet to help her organize her meals during the week to reduce confusion on what to eat, plan healthier meals, and to reduce eating out during the week.   Patient expressed that she needs to work on thinking for herself and not take everyone else to be right. Patient stated that she can ask more questions.  Patient will continue to implement action steps outlined below.   Overall Goal(s): Stress management Reduce sodium consumption Lower A1C Lower LDL  Agreement/Action Steps:    Reduce Sodium Consumption/lower A1C/lower LDL Read food labels to monitor sodium intake Monitor sodium consumption with sodium tracker Drink 2-8 oz. cups of tea daily Drink 3-16 oz. bottles of water daily Eat breakfast 10:30am -11:00am Eat lunch by 3pm Eat dinner at  7pm Bake food instead of frying Limit eating out to only weekends and cook Monday - Friday Set boundaries around eating out with family and friends  Stress Management Attend PREP class T & Th Utilize support system Write in gratitude journal daily (3 things grateful for and/or 3 things was successful at doing) Set an alarm for 5:30pm to start writing for 10-15 minutes  Conduct self-check-ins and implement positive self-talk Engage in church activities    Attempted: Fulfilled - Patient completed the weekly agreement in full and was able to meet the challenge.

## 2021-01-10 ENCOUNTER — Other Ambulatory Visit: Payer: Self-pay

## 2021-01-10 ENCOUNTER — Ambulatory Visit (AMBULATORY_SURGERY_CENTER): Payer: Medicare PPO | Admitting: Gastroenterology

## 2021-01-10 ENCOUNTER — Encounter: Payer: Self-pay | Admitting: Gastroenterology

## 2021-01-10 VITALS — BP 154/63 | HR 51 | Temp 97.8°F | Resp 9 | Ht 63.0 in | Wt 187.0 lb

## 2021-01-10 DIAGNOSIS — D120 Benign neoplasm of cecum: Secondary | ICD-10-CM | POA: Diagnosis not present

## 2021-01-10 DIAGNOSIS — Z86010 Personal history of colonic polyps: Secondary | ICD-10-CM

## 2021-01-10 DIAGNOSIS — G4733 Obstructive sleep apnea (adult) (pediatric): Secondary | ICD-10-CM | POA: Diagnosis not present

## 2021-01-10 DIAGNOSIS — D122 Benign neoplasm of ascending colon: Secondary | ICD-10-CM | POA: Diagnosis not present

## 2021-01-10 DIAGNOSIS — D123 Benign neoplasm of transverse colon: Secondary | ICD-10-CM

## 2021-01-10 DIAGNOSIS — K5730 Diverticulosis of large intestine without perforation or abscess without bleeding: Secondary | ICD-10-CM | POA: Diagnosis not present

## 2021-01-10 DIAGNOSIS — E119 Type 2 diabetes mellitus without complications: Secondary | ICD-10-CM | POA: Diagnosis not present

## 2021-01-10 DIAGNOSIS — D124 Benign neoplasm of descending colon: Secondary | ICD-10-CM | POA: Diagnosis not present

## 2021-01-10 MED ORDER — SODIUM CHLORIDE 0.9 % IV SOLN: 500.0000 mL | Freq: Once | INTRAVENOUS | Status: AC

## 2021-01-10 NOTE — Op Note (Signed)
[place] Patient Name: Jasmine Lopez Procedure Date: 01/10/2021 11:20 AM MRN: [id number] Endoscopist: Remo Lipps P. Havery Moros , MD Age: 80 Referring MD:  Date of Birth: 10/03/1940 Gender: Female Account #: [id number] Procedure:                Colonoscopy Indications:              High risk colon cancer surveillance: Personal                            history of colonic polyps (adenomas 2017) Medicines:                Monitored Anesthesia Care Procedure:                Pre-Anesthesia Assessment:                           - Prior to the procedure, a History and Physical                            was performed, and patient medications and                            allergies were reviewed. The patient's tolerance of                            previous anesthesia was also reviewed. The risks                            and benefits of the procedure and the sedation                            options and risks were discussed with the patient.                            All questions were answered, and informed consent                            was obtained. Prior Anticoagulants: The patient has                            taken no previous anticoagulant or antiplatelet                            agents. ASA Grade Assessment: II - A patient with                            mild systemic disease. After reviewing the risks                            and benefits, the patient was deemed in                            satisfactory condition to undergo the procedure.  After obtaining informed consent, the colonoscope                            was passed under direct vision. Throughout the                            procedure, the patient's blood pressure, pulse, and                            oxygen saturations were monitored continuously. The                            PCF-HQ190L Colonoscope was introduced through the                            anus and  advanced to the the cecum, identified by                            appendiceal orifice and ileocecal valve. The                            colonoscopy was performed without difficulty. The                            patient tolerated the procedure well. The quality                            of the bowel preparation was good. The ileocecal                            valve, appendiceal orifice, and rectum were                            photographed. Scope In: 11:34:20 AM Scope Out: 11:55:31 AM Scope Withdrawal Time: 0 hours 11 minutes 52 seconds  Total Procedure Duration: 0 hours 21 minutes 11 seconds  Findings:                 The perianal and digital rectal examinations were                            normal.                           A 3 mm polyp was found in the cecum. The polyp was                            sessile. The polyp was removed with a cold snare.                            Resection and retrieval were complete.                           Two sessile polyps were found in the ascending  colon. The polyps were 4 mm in size. These polyps                            were removed with a cold snare. Resection and                            retrieval were complete.                           A 3 mm polyp was found in the transverse colon. The                            polyp was sessile. The polyp was removed with a                            cold snare. Resection and retrieval were complete.                           Two sessile polyps were found in the descending                            colon. The polyps were 3 to 4 mm in size. These                            polyps were removed with a cold snare. Resection                            and retrieval were complete.                           Multiple medium-mouthed diverticula were found in                            the transverse colon and left colon.                           Anal papilla(e) were  hypertrophied.                           The exam was otherwise without abnormality. Of note                            I thought a photo of the IC valve was taken and it                            was not. The IC valve was normal. Complications:            No immediate complications. Estimated blood loss:                            Minimal. Estimated Blood Loss:     Estimated blood loss was minimal. Impression:               - One 3 mm  polyp in the cecum, removed with a cold                            snare. Resected and retrieved.                           - Two 4 mm polyps in the ascending colon, removed                            with a cold snare. Resected and retrieved.                           - One 3 mm polyp in the transverse colon, removed                            with a cold snare. Resected and retrieved.                           - Two 3 to 4 mm polyps in the descending colon,                            removed with a cold snare. Resected and retrieved.                           - Diverticulosis in the transverse colon and in the                            left colon.                           - Anal papilla(e) were hypertrophied.                           - The examination was otherwise normal. Recommendation:           - Patient has a contact number available for                            emergencies. The signs and symptoms of potential                            delayed complications were discussed with the                            patient. Return to normal activities tomorrow.                            Written discharge instructions were provided to the                            patient.                           - Resume previous diet.                           -  Continue present medications.                           - Await pathology results. Remo Lipps P. Khamiyah Grefe, MD 01/10/2021 12:01:35 PM This report has been signed electronically.

## 2021-01-10 NOTE — Progress Notes (Signed)
Per Lafe Garin, CRNA, IV rt ac was blown.  Rt ac IV removed and restarted iv in rt wrist #24.

## 2021-01-10 NOTE — Progress Notes (Signed)
[facility] History and Physical   Primary Care Physician:  [name], FNP   Reason for Procedure:   History of colon polyps  Plan:    colonoscopy     HPI: Jasmine Lopez is a 80 y.o. female with history of adenomas in 06/2015. Otherwise feels well without symptoms, good longevity in her family and she strongly wishes to have a colonoscopy prior to stopping further surveillance. She has no cardiopulmonary symptoms and feels well without complaints.   Past Medical History:  Diagnosis Date   Anxiety    Breast cancer ([place])    Cancer ([place])    Diabetes mellitus without complication ([place])    Dysphagia    GERD (gastroesophageal reflux disease) 10/12/2020   Hair loss 10/12/2020   Heart murmur    Hx of adenomatous polyp of colon 06/2015   [name]- [facility]   Hypertension    Irregular heart beat    Sleep apnea     Past Surgical History:  Procedure Laterality Date   BREAST EXCISIONAL BIOPSY Bilateral    BREAST LUMPECTOMY Left 2016    Prior to Admission medications   Medication Sig Start Date End Date Taking? Authorizing Provider  Accu-Chek Softclix Lancets lancets Use to check blood sugars twice daily E11.69 06/06/20  Yes [name], FNP  allopurinol (ZYLOPRIM) 100 MG tablet TAKE 1 TABLET BY MOUTH EVERY DAY 11/13/20  Yes [name], FNP  amLODipine (NORVASC) 10 MG tablet Take 1 tablet (10 mg total) by mouth daily. 07/24/20  Yes Skeet Latch, MD  blood glucose meter kit and supplies KIT Dispense based on patient and insurance preference. Use up to two times daily as directed. (FOR ICD-9 250.00, 250.01). 06/03/18  Yes Rodriguez-Southworth, Sunday Spillers, PA-C  buPROPion (WELLBUTRIN XL) 150 MG 24 hr tablet TAKE 1 TABLET(150 MG) BY MOUTH EVERY MORNING 06/25/20  Yes [name], FNP  carvedilol (COREG) 6.25 MG tablet Take 1 tablet (6.25 mg total) by mouth 2 (two) times daily with a meal. 07/24/20  Yes Skeet Latch, MD  Cholecalciferol (VITAMIN D3)  2000 units TABS Take by mouth.   Yes [provider]  Garlic 2122 MG CAPS Take 1 capsule by mouth daily.   Yes [provider]  glucose blood (ACCU-CHEK SMARTVIEW) test strip Use to check blood sugars twice daily E11.69 06/06/20  Yes [name], FNP  metFORMIN (GLUCOPHAGE-XR) 500 MG 24 hr tablet TAKE 1 TABLET BY MOUTH TWICE A DAY Patient taking differently: Once daily 08/20/20  Yes [name], FNP  rosuvastatin (CRESTOR) 20 MG tablet TAKE 1 TABLET(20 MG) BY MOUTH DAILY 09/28/20  Yes Skeet Latch, MD  valsartan-hydrochlorothiazide (DIOVAN-HCT) 160-25 MG tablet Take 1 tablet by mouth daily. 07/24/20  Yes Skeet Latch, MD  acetaminophen (TYLENOL) 500 MG tablet Take 500 mg by mouth every 6 (six) hours as needed.    [provider]  Biotin 1000 MCG tablet biotin    [provider]  Cholecalciferol (VITAMIN D3) 1000000 UNIT/GM LIQD Vitamin D3    [provider]    Current Outpatient Medications  Medication Sig Dispense Refill   Accu-Chek Softclix Lancets lancets Use to check blood sugars twice daily E11.69 100 each 2   allopurinol (ZYLOPRIM) 100 MG tablet TAKE 1 TABLET BY MOUTH EVERY DAY 90 tablet 0   amLODipine (NORVASC) 10 MG tablet Take 1 tablet (10 mg total) by mouth daily. 90 tablet 2   blood glucose meter kit and supplies KIT Dispense based on patient and insurance preference. Use up  to two times daily as directed. (FOR ICD-9 250.00, 250.01). 1 each 0   buPROPion (WELLBUTRIN XL) 150 MG 24 hr tablet TAKE 1 TABLET(150 MG) BY MOUTH EVERY MORNING 90 tablet 1   carvedilol (COREG) 6.25 MG tablet Take 1 tablet (6.25 mg total) by mouth 2 (two) times daily with a meal. 180 tablet 2   Cholecalciferol (VITAMIN D3) 2000 units TABS Take by mouth.     Garlic 6147 MG CAPS Take 1 capsule by mouth daily.     glucose blood (ACCU-CHEK SMARTVIEW) test strip Use to check blood sugars twice daily E11.69 100 each 2   metFORMIN (GLUCOPHAGE-XR) 500 MG 24 hr  tablet TAKE 1 TABLET BY MOUTH TWICE A DAY (Patient taking differently: Once daily) 180 tablet 1   rosuvastatin (CRESTOR) 20 MG tablet TAKE 1 TABLET(20 MG) BY MOUTH DAILY 90 tablet 3   valsartan-hydrochlorothiazide (DIOVAN-HCT) 160-25 MG tablet Take 1 tablet by mouth daily. 90 tablet 1   acetaminophen (TYLENOL) 500 MG tablet Take 500 mg by mouth every 6 (six) hours as needed.     Biotin 1000 MCG tablet biotin     Cholecalciferol (VITAMIN D3) 1000000 UNIT/GM LIQD Vitamin D3     Current Facility-Administered Medications  Medication Dose Route Frequency Provider Last Rate Last Admin   0.9 %  sodium chloride infusion  500 mL Intravenous Once Takeria Marquina, Carlota Raspberry, MD        Allergies as of 01/10/2021 - Review Complete 01/10/2021  Allergen Reaction Noted   Ace inhibitors Cough 06/18/2018   Atorvastatin Other (See Comments) 04/29/2019   Benadryl [diphenhydramine] Itching 06/15/2018   Peppermint flavor [flavoring agent]  10/13/2019    Family History  Problem Relation Age of Onset   Heart failure Mother        CAD   COPD Mother    Hypertension Mother    Cancer Father    Rectal cancer Maternal Grandmother        Unsure   Colon cancer Maternal Grandmother    Heart attack Maternal Grandfather    Esophageal cancer Neg Hx    Stomach cancer Neg Hx     Social History   Socioeconomic History   Marital status: Widowed    Spouse name: Not on file   Number of children: Not on file   Years of education: Not on file   Highest education level: Not on file  Occupational History   Occupation: retired  Tobacco Use   Smoking status: Never   Smokeless tobacco: Never  Vaping Use   Vaping Use: Never used  Substance and Sexual Activity   Alcohol use: Never   Drug use: Never   Sexual activity: Not Currently  Other Topics Concern   Not on file  Social History Narrative   Lives home alone.  Widowed.  Retired.  MS counseling education. 4 cups coffee/ wk.     Social Determinants of Health    Financial Resource Strain: Low Risk    Difficulty of Paying Living Expenses: Not hard at all  Food Insecurity: No Food Insecurity   Worried About Charity fundraiser in the Last Year: Never true   [place] in the Last Year: Never true  Transportation Needs: No Transportation Needs   Lack of Transportation (Medical): No   Lack of Transportation (Non-Medical): No  Physical Activity: Insufficiently Active   Days of Exercise per Week: 2 days   Minutes of Exercise per Session: 60 min  Stress: No Stress Concern Present  Feeling of Stress : Not at all  Social Connections: Not on file  Intimate Partner Violence: Not on file    Review of Systems: All other review of systems negative except as mentioned in the HPI.  Physical Exam: Vital signs BP (!) 144/55   Pulse 64   Temp 97.8 F (36.6 C) (Temporal)   Resp 14   Ht '5\' 3"'  (1.6 m)   Wt 187 lb (84.8 kg)   SpO2 100%   BMI 33.13 kg/m   General:   Alert,  Well-developed, well-nourished, pleasant and cooperative in NAD Lungs:  Clear throughout to auscultation.   Heart:  Regular rate and rhythm;  Abdomen:  Soft, nontender and nondistended.   Neuro/Psych:  Alert and cooperative. Normal mood and affect. A and O x 3  Jolly Mango, MD [facility]

## 2021-01-10 NOTE — Progress Notes (Signed)
Called to room to assist during endoscopic procedure.  Patient ID and intended procedure confirmed with present staff. Received instructions for my participation in the procedure from the performing physician.  

## 2021-01-10 NOTE — Patient Instructions (Signed)
Information on polyps and diverticulosis given to you today.  Await pathology results.  Resume previous diet and medications.  YOU HAD AN ENDOSCOPIC PROCEDURE TODAY AT THE [facility]:   Refer to the procedure report that was given to you for any specific questions about what was found during the examination.  If the procedure report does not answer your questions, please call your gastroenterologist to clarify.  If you requested that your care partner not be given the details of your procedure findings, then the procedure report has been included in a sealed envelope for you to review at your convenience later.  YOU SHOULD EXPECT: Some feelings of bloating in the abdomen. Passage of more gas than usual.  Walking can help get rid of the air that was put into your GI tract during the procedure and reduce the bloating. If you had a lower endoscopy (such as a colonoscopy or flexible sigmoidoscopy) you may notice spotting of blood in your stool or on the toilet paper. If you underwent a bowel prep for your procedure, you may not have a normal bowel movement for a few days.  Please Note:  You might notice some irritation and congestion in your nose or some drainage.  This is from the oxygen used during your procedure.  There is no need for concern and it should clear up in a day or so.  SYMPTOMS TO REPORT IMMEDIATELY:   Following lower endoscopy (colonoscopy or flexible sigmoidoscopy):  Excessive amounts of blood in the stool  Significant tenderness or worsening of abdominal pains  Swelling of the abdomen that is new, acute  Fever of 100F or higher   For urgent or emergent issues, a gastroenterologist can be reached at any hour by calling [phone]. Do not use MyChart messaging for urgent concerns.    DIET:  We do recommend a small meal at first, but then you may proceed to your regular diet.  Drink plenty of fluids but you should avoid alcoholic beverages for 24  hours.  ACTIVITY:  You should plan to take it easy for the rest of today and you should NOT DRIVE or use heavy machinery until tomorrow (because of the sedation medicines used during the test).    FOLLOW UP: Our staff will call the number listed on your records 48-72 hours following your procedure to check on you and address any questions or concerns that you may have regarding the information given to you following your procedure. If we do not reach you, we will leave a message.  We will attempt to reach you two times.  During this call, we will ask if you have developed any symptoms of COVID 19. If you develop any symptoms (ie: fever, flu-like symptoms, shortness of breath, cough etc.) before then, please call [phone].  If you test positive for Covid 19 in the 2 weeks post procedure, please call and report this information to us.    If any biopsies were taken you will be contacted by phone or by letter within the next 1-3 weeks.  Please call us at [phone] if you have not heard about the biopsies in 3 weeks.    SIGNATURES/CONFIDENTIALITY: You and/or your care partner have signed paperwork which will be entered into your electronic medical record.  These signatures attest to the fact that that the information above on your After Visit Summary has been reviewed and is understood.  Full responsibility of the confidentiality of this discharge information lies with you and/or   your care-partner. 

## 2021-01-10 NOTE — Progress Notes (Signed)
Report given to PACU, vss 

## 2021-01-15 ENCOUNTER — Telehealth: Payer: Self-pay | Admitting: *Deleted

## 2021-01-15 NOTE — Telephone Encounter (Signed)
Attempted 2nd f/u phone call. No answer. Left message.  °

## 2021-01-15 NOTE — Telephone Encounter (Signed)
Attempted f/u phone call. No answer. Left message. °

## 2021-01-21 ENCOUNTER — Telehealth: Payer: Self-pay

## 2021-01-21 DIAGNOSIS — Z0000 Encounter for general adult medical examination without abnormal findings: Secondary | ICD-10-CM

## 2021-01-21 NOTE — Telephone Encounter (Signed)
Called patient to reschedule health coaching session from 9/13 at 10:30am to 9/14 at 10:30am due to having [employer] shadowing on 9/13 during patient scheduled visit. Patient agreed to rescheduled date and time suggested. Patient will be called by coach at this time. Patient verbally expressed understanding and had no further questions.

## 2021-01-22 ENCOUNTER — Ambulatory Visit: Payer: Medicare PPO

## 2021-01-22 NOTE — Progress Notes (Signed)
[facility] PREP Progress Report   Patient Details  Name: Jasmine Lopez MRN: [id number] Date of Birth: October 18, 1940 Age: 80 y.o. PCP: [name], FNP  Vitals:   01/22/21 1428  BP: (!) 122/50  Pulse: 68  SpO2: 97%  Weight: 175 lb 12.8 oz (79.7 kg)      [facility] Eval - 01/22/21 1400       Referral    Referring Provider [facility] Start Date --   PREP start 10/30/20 to 01/22/21     Measurement   Waist Circumference 40 inches    Hip Circumference 43 inches    Body fat 44.5 percent      Information for Trainer   Goals --   Reviewed new goals to keep exercising     Mobility and Daily Activities   I find it easy to walk up or down two or more flights of stairs. 3    I have no trouble taking out the trash. 2    I do housework such as vacuuming and dusting on my own without difficulty. 2    I can easily lift a gallon of milk (8lbs). 2    I can easily walk a mile. 3    I have no trouble reaching into high cupboards or reaching down to pick up something from the floor. 4    I do not have trouble doing out-door work such as Armed forces logistics/support/administrative officer, raking leaves, or gardening. 2      Mobility and Daily Activities   I feel younger than my age. 3    I feel independent. 3    I feel energetic. 4    I live an active life.  3    I feel strong. 4    I feel healthy. 3    I feel active as other people my age. 3      How fit and strong are you.   Fit and Strong Total Score 41            Past Medical History:  Diagnosis Date   Anxiety    Breast cancer ([place])    Cancer ([place])    Diabetes mellitus without complication ([place])    Dysphagia    GERD (gastroesophageal reflux disease) 10/12/2020   Hair loss 10/12/2020   Heart murmur    Hx of adenomatous polyp of colon 06/2015   [name]- [facility]   Hypertension    Irregular heart beat    Sleep apnea    Past Surgical History:  Procedure Laterality Date   BREAST EXCISIONAL BIOPSY Bilateral    BREAST LUMPECTOMY Left  2016   Social History   Tobacco Use  Smoking Status Never  Smokeless Tobacco Never    Class at [employer] march: 228 to 309 Sit to stand: 10 to 15 Bicep curl: 18 to 20 1 inch loss at waist/hips 9.8 lbs lost Still needs work with single leg balances     [name] 01/22/2021, 2:49 PM

## 2021-01-23 ENCOUNTER — Ambulatory Visit: Payer: Medicare PPO

## 2021-01-23 ENCOUNTER — Other Ambulatory Visit: Payer: Self-pay

## 2021-01-23 DIAGNOSIS — Z0000 Encounter for general adult medical examination without abnormal findings: Secondary | ICD-10-CM

## 2021-01-23 NOTE — Progress Notes (Signed)
Appointment Outcome:  Completed, Session #: 2 Start time: 10:31am   End time: 11:09am   Total Mins: 38 minutes  AGREEMENTS SECTION   Overall Goal(s): Stress management Reduce sodium consumption Lower A1C Lower LDL   Agreement/Action Steps:     Reduce Sodium Consumption/lower A1C/lower LDL Read food labels to monitor sodium intake Monitor sodium consumption with sodium tracker Drink 2-8 oz. cups of tea daily Drink 3-16 oz. bottles of water daily Eat breakfast 10:30am -11:00am Eat lunch by 3pm Eat dinner at 7pm Bake food instead of frying Limit eating out to only weekends and cook Monday - Friday Set boundaries around eating out with family and friends   Stress Management Attend PREP class T & Th Utilize support system Write in gratitude journal daily (3 things grateful for and/or 3 things was successful at doing) Set an alarm for 5:30pm to start writing for 10-15 minutes  Conduct self-check-ins and implement positive self-talk Engage in church activities  Progress Notes:  Patient has been cooking at home and eating on schedule but have had some family members bring her food. Patient stated that she had an incident where her legs were swollen for two days after eating shrimp, vegetables, and noodles. Patient shared that the meal was salty because of the sauce. Patient mentioned that she has set boundaries around her eating habits with her family and friends. Patient stated that she made them aware that she does not want salt or sugar. Patient stated that she has cut some foods that she loves from her diet to reduce sodium and sugar intake.   Patient mentioned that she cooked pork chop in olive oil and thought that was considered frying because it is an oil. Patient stated that she discarded all other cooking oils. Patient has stopped her routine of getting fried fish on Fridays. Patient shared that everything else she cooks is in her bake oven. Patient has resumed her salt free  diet and recognizes that she had to set boundaries around food with her family and friends. Patient mentioned that she has thrown away any seasonings with salt.   Patient is drinking her tea daily but find it difficult to remember to drink all her water. Patient stated that she finds herself with three half bottles of water because she has them in different locations in the house. Patient stated that this confuses her because she thought she was consuming all the water until she realized what she was doing. Patient shared that she does manage to drink all the water approximately 3-4 days/week. Patient stated that she will adjust her routine to meet her water intake. Patient stated that she will not leave her bottles in various rooms or the car.   Patient stated that she has completed PREP on Tuesday. Patient mentioned that she only missed one class out of the 12 weeks. Patient has signed up for classes at a rec center near her home to exercise on Monday, Wednesday, and Friday each week. Patient has continued to engage in church activities and shared that she is still working on completing the welcoming sign. The patient mentioned that she has two weeks to finish it.   Patient mentioned that she has physically written in her journal approximately 3-4 times/week. Patient stated that she sees the benefit of writing things down. Patient continues to do mental journaling a well if she is not near her journal to write. Patient that she will keep the alarm because it also provides her a signal to  regroup for the day and to take personal time while writing. Patient stated that she writes about what she is grateful for and other things she would like to accomplish. Patient stated that writing is helping with her memory as well.   Patient is utilizing her support system. Patient connects with her support system daily. Patient finds that conducting self-check-ins and implementing positive self-talk are ways that she  can keep herself from getting overwhelmed or having to rely on someone else to affirm her decisions.   Indicators of Success and Accountability:  Patient stated that training herself to implement her steps to achieve her goals is her indicator of success and accountability.  Readiness: Patient is in the action stage of stress management, reducing sodium consumption, and lowering her A1C and LDL.  Strengths and Supports: Patient is being supported by family and friends. Patient is being more mindful about her behaviors.  Challenges and Barriers: Remembering to drink the full three bottles of water is the only challenge that the patient may have in the next two weeks.   Coaching Outcomes: Patient will leave water bottles in the kitchen and mark them, so she knows when she has drunk 48 oz. of water.   Patient will start attending classes at the rec center 3x/week on Mon, Wed, and Fri.   Patient will continue to implement her action steps as outlined above.   Attempted: Fulfilled - Patient is reading food labels to monitor sodium intake along with using the sodium tracker sheets. Patient is drinking 2 cups of tea daily and eating on schedule. Patient is baking her food instead of frying, patient has not eaten out because she is cooking at home, and have set boundaries with family and friends around her eating habits. Patient attended PREP over the past two weeks as scheduled. Patient is writing in her gratitude journal and continue to set the alarm. Patient is conducting check-ins and implementing positive self-talk. Patient is engaged church activities.  Partial - Patient drunk 3 bottles of water a day for approximately 3-4 times a week instead of daily.

## 2021-01-30 DIAGNOSIS — S29012A Strain of muscle and tendon of back wall of thorax, initial encounter: Secondary | ICD-10-CM | POA: Diagnosis not present

## 2021-01-30 DIAGNOSIS — M9902 Segmental and somatic dysfunction of thoracic region: Secondary | ICD-10-CM | POA: Diagnosis not present

## 2021-01-30 DIAGNOSIS — M47817 Spondylosis without myelopathy or radiculopathy, lumbosacral region: Secondary | ICD-10-CM | POA: Diagnosis not present

## 2021-01-30 DIAGNOSIS — M5032 Other cervical disc degeneration, mid-cervical region, unspecified level: Secondary | ICD-10-CM | POA: Diagnosis not present

## 2021-01-30 DIAGNOSIS — M9905 Segmental and somatic dysfunction of pelvic region: Secondary | ICD-10-CM | POA: Diagnosis not present

## 2021-01-30 DIAGNOSIS — M9903 Segmental and somatic dysfunction of lumbar region: Secondary | ICD-10-CM | POA: Diagnosis not present

## 2021-01-30 DIAGNOSIS — M5136 Other intervertebral disc degeneration, lumbar region: Secondary | ICD-10-CM | POA: Diagnosis not present

## 2021-01-30 DIAGNOSIS — M9901 Segmental and somatic dysfunction of cervical region: Secondary | ICD-10-CM | POA: Diagnosis not present

## 2021-02-05 ENCOUNTER — Other Ambulatory Visit: Payer: Self-pay | Admitting: Cardiovascular Disease

## 2021-02-06 ENCOUNTER — Other Ambulatory Visit: Payer: Self-pay | Admitting: Nurse Practitioner

## 2021-02-06 ENCOUNTER — Ambulatory Visit (INDEPENDENT_AMBULATORY_CARE_PROVIDER_SITE_OTHER): Payer: Medicare PPO

## 2021-02-06 ENCOUNTER — Other Ambulatory Visit: Payer: Self-pay

## 2021-02-06 DIAGNOSIS — Z0000 Encounter for general adult medical examination without abnormal findings: Secondary | ICD-10-CM

## 2021-02-06 DIAGNOSIS — Z1231 Encounter for screening mammogram for malignant neoplasm of breast: Secondary | ICD-10-CM

## 2021-02-06 NOTE — Progress Notes (Signed)
Appointment Outcome:  Completed, Session #: 3 Start time: 10:32am   End time: 11:03am   Total Mins: 31 minutes  AGREEMENTS SECTION  Overall Goal(s): Stress management Reduce sodium consumption Lower A1C Lower LDL   Agreement/Action Steps:     Reduce Sodium Consumption/lower A1C/lower LDL Read food labels to monitor sodium intake Monitor sodium consumption with sodium tracker Drink 2-8 oz. cups of tea daily Drink 3-16 oz. bottles of water daily Eat breakfast 10:30am -11:00am Eat lunch by 3pm Eat dinner at 7pm Bake food instead of frying Limit eating out to only weekends and cook Monday - Friday Set boundaries around eating out with family and friends   Stress Management Utilize support system Write in gratitude journal daily (3 things grateful for and/or 3 things was successful at doing) Set an alarm for 5:30pm to start writing for 10-15 minutes  Engage in church activities Conduct self-check-ins and implement positive self-talk  Progress Notes:  Patient continues to read food labels to monitor sodium intake when shopping, but is not using the sodium tracker sheets to have a written account of her sodium intake. Patient expressed that she has eaten out ([employer]) with a family member. Patient stated that she did not think at the time of ways that she could have minimized her sodium consumption.   Patient shared that during this restaurant visit she had salad and soup. Patient stated that she normally requests dressing on the side because it is salty so she can control how much she uses. However, patient did not use this strategy because she was eating with someone else and went with their preference. Patient is not setting boundaries around healthy eating when out with family and friends.   Patient has been eating around 12pm because she is fasting. Patient stated that during that time she have breakfast. Patient is drinking the set ounces and water and tea per day. Patient  stated that marking her bottles so she doesn't forget to finish them. Patient stated that if she leaves the house she takes her water bottle with her like she did today to go exercise.   Patient has utilized her support system for various reasons. Additionally, patient is writing in her journal daily unless she is not physically home to write. Patient stated that during those times (driving or with others) she mental conduct her journal entry at 5:30pm. Patient stated that this step helps her wrap up her day and hold her accountable.   Patient stated that she also uses this time to meditate and have to do it more often. She uses this time also to conduct self-check-ins as well as throughout the day. Patient stated that she is implementing positive self-talk throughout the day. Patient stated that she is calmer, relaxed, and focused.   Patient has found a sign for her church event and is working on Interior and spatial designer it. Patient also has started attending exercise classes at the [facility]. Patient is signed up for Tai Chi and Yoga, Chair Yoga, and Line Dancing. Patient has been attending the center 3-4 times per week.   Indicators of Success and Accountability:  Patient has continued exercising at the [facility] after completing [place] at the [facility].   Readiness: Patient is in the action stage of reducing stress, sodium consumption, lowering A1C and LDL.  Strengths and Supports: Patient is being supported by family and friends. Patient has become more mindful of her behaviors and decisions.  Challenges and Barriers: Patient does not foresee  any challenges to implementing action steps over the next two weeks.   Coaching Outcomes: Patient will implement the following steps outlined below for the next two weeks.   Agreement/Action Steps:     Reduce Sodium Consumption/lower A1C/lower LDL Read food labels to monitor sodium intake Drink 2-8 oz. cups of tea daily Drink 3-16 oz. bottles of  water daily Bake food instead of frying Limit eating out to only weekends and cook Monday - Friday Set boundaries around healthy eating when out with family and friends   Stress Management Utilize support system Write in gratitude journal daily (3 things grateful for and/or 3 things was successful at doing) Set an alarm for 5:30pm to start writing for 10-15 minutes  Engage in church activities Conduct self-check-ins and implement positive self-talk Exercise at [facility] 3-4 days/week  Attempted: Fulfilled - Patient has been able to read food labels on foods she purchased from the store. Patient is drinking the set oz of water and tea daily. Patient is pan searing or baking her meats instead of frying them.  Partial - Patient is able to follow an eating schedule that starts around 12pm instead of 10:30am. Patient has not been able to limit her fast food or restaurant visits to only the weekends.  Not met - Patient is not monitoring sodium intake with sodium tracker sheets. Patient has not enforced boundaries around healthy eating when out with family and friends.

## 2021-02-20 ENCOUNTER — Ambulatory Visit: Payer: Medicare PPO

## 2021-02-20 ENCOUNTER — Telehealth: Payer: Self-pay

## 2021-02-20 DIAGNOSIS — Z0000 Encounter for general adult medical examination without abnormal findings: Secondary | ICD-10-CM

## 2021-02-20 NOTE — Telephone Encounter (Signed)
Called patient to hold health coaching session over the phone as scheduled. Patient requested to be rescheduled for another time this week. Patient has been rescheduled for tomorrow, October 13th at 2:30pm. Will call patient at the requested time.    Melinda Pottinger Truman Hayward, [facility] Guide, Health Coach [address], [address] [place] [address] Telephone: [phone] Email: [email or web address]

## 2021-02-21 ENCOUNTER — Other Ambulatory Visit: Payer: Self-pay

## 2021-02-21 ENCOUNTER — Ambulatory Visit (INDEPENDENT_AMBULATORY_CARE_PROVIDER_SITE_OTHER): Payer: Medicare PPO

## 2021-02-21 DIAGNOSIS — Z0000 Encounter for general adult medical examination without abnormal findings: Secondary | ICD-10-CM

## 2021-02-21 NOTE — Progress Notes (Signed)
Appointment Outcome: Completed, Session #: 4 Start time: 2:31pm    End time: 3:11pm   Total Mins: 40 minutes  AGREEMENTS SECTION    Overall Goal(s): Stress management Reduce sodium consumption Lower A1C Lower LDL                                         Agreement/Action Steps:    Reduce Sodium Consumption/lower A1C/lower LDL Read food labels to monitor sodium intake Drink 2-8 oz. cups of tea daily Drink 3-16 oz. bottles of water daily Bake food instead of frying Limit eating out to only weekends and cook Monday - Friday Set boundaries around healthy eating when out with family and friends   Stress Management Utilize support system Write in gratitude journal daily (3 things grateful for and/or 3 things was successful at doing) Set an alarm for 5:30pm to start writing for 10-15 minutes  Engage in church activities Conduct self-check-ins and implement positive self-talk Exercise at [facility] 3-4 days/week  Progress Notes:  Patient stated that she is reading food labels and as a result she has stopped purchasing can goods. Patient shared that she is purchasing her vegetables frozen but have not thought about reading the food label on those items until now. Patient stated that she has done well with her water intake and drinking tea. Patient mentioned that she no longer has half empty bottles laying around at the end of the day because marking the bottles helps her remember to drink all the water in each bottle.  Patient shared that she is not frying foods when she cooks at home. Patient stated that she has been making salads a lot. Patient is making her own dressing out of olive oil, lemon juice, and vinegar. Patient stated that she is not adding salt to her food, tries to have something fresh on her plate, while half her plate is vegetables. Patient was able to adhere to limiting her fast-food consumption during the week until the past few days. Patient is not enforcing  boundaries around healthy eating with family and friends when out. Patient stated that she gives in to what others want to eat instead of picking somewhere with healthier options. Patient shared an incident where her sister-in-law had coupons to [facility], and they got fried chicken with green beans.  Patient continues to conduct gratitude reflections verbally when with others or in her journal when at home. Patient sometimes write notes to herself if she is not home. Patient states that if she is with someone at 5:30pm, she will excuse herself to complete this technique. Patient stated that she is conducting self-check-ins and implementing positive self-talk. Patient mentioned that she starts her day off with these steps early in the morning. Patient stated that she analyzes her behavior throughout the day and how she is feeling. This helps her stay on tasks and to keep her from falling back into bad habits. Patient is utilizing her support system but is finding the need for her to be more self-sufficient and make more decisions on her own.   Patient has been working on her sign for church. Patient shared the next steps to completing this activity before they have open house in the new community of families. Patient stated that she does limit being around large crowds with church activities and continue to watch church online. Patient stated that she did recently visit a  church in-person. Patient has been attending the [facility] to engage in different classes. Patient has maintained going three days a week. Patient is enjoying meeting others in class and believes that has been helpful for her to interact with a few people.  Indicators of Success and Accountability:  Patient stated that she was good with her water intake, keeping up with schedule to attend classes at [facility], and having a plan for the day to remain restructured.  Readiness: Patient is in the action stage of stress management, reduce  sodium consumption, lower A1C and lower LDL.  Strengths and Supports: Patient is being supported by family and friends. Patient is becoming more mindful and intentional about her behaviors. She is also focused and analytical.  Challenges and Barriers: Patient's challenge is being assertive with others so that she can adhere to her action steps when she is accompanied by friends and family.   Coaching Outcomes: Patient stated that she wants to be religious about implementing her action steps even when she is challenged by others. Patient mentioned that she will have to start being assertive and saying no to eating certain foods with family and friends. Patient stated that she will stop giving in to others' decisions about food to eat when out. Patient stated that she will start eating before leaving home or take a healthy snack with her in case she gets hungry to reduce the occurrence of eating out during the week.   Patient will carry her water bottles with her when she leaves home to aid in drinking her water before the night comes.   Patient will continue to implement all action steps outlined above over the next two weeks as well.   Attempted: Fulfilled - Patient is drinking the amount of tea and water that she set for herself. When cooking at home patient is not frying food. Patient is utilizing her support system, conducting self-check-ins, implementing gratitude reflections verbally or in journal and implementing positive self-talk. Patient is engaged in church activities and exercising at the [facility] 3 times a week.  Partial - Patient is reading food labels on some of the foods that she buys, Not met - Patient has not limited eating out to only weekends or enforced boundaries regarding healthy eating when out with family and friends.

## 2021-02-22 ENCOUNTER — Other Ambulatory Visit: Payer: Self-pay | Admitting: Nurse Practitioner

## 2021-02-26 ENCOUNTER — Other Ambulatory Visit: Payer: Self-pay | Admitting: Nurse Practitioner

## 2021-03-07 ENCOUNTER — Other Ambulatory Visit: Payer: Self-pay

## 2021-03-07 ENCOUNTER — Ambulatory Visit (INDEPENDENT_AMBULATORY_CARE_PROVIDER_SITE_OTHER): Payer: Medicare PPO

## 2021-03-07 DIAGNOSIS — Z0000 Encounter for general adult medical examination without abnormal findings: Secondary | ICD-10-CM

## 2021-03-07 NOTE — Progress Notes (Signed)
Appointment Outcome: Completed, Session #: 5 Start time: 2:16pm   End time: 2:49pm   Total Mins: 33 minutes  AGREEMENTS SECTION    Overall Goal(s): Stress management Reduce sodium consumption Lower A1C Lower LDL                                          Agreement/Action Steps:    Reduce Sodium Consumption/lower A1C/lower LDL Read food labels to monitor sodium intake Drink 2-8 oz. cups of tea daily Drink 3-16 oz. bottles of water daily Bake food instead of frying Limit eating out to only weekends and cook Monday - Friday Set boundaries around healthy eating when out with family and friends   Stress Management Utilize support system Write in gratitude journal daily (3 things grateful for and/or 3 things was successful at doing) Set an alarm for 5:30pm to start writing for 10-15 minutes  Engage in church activities Conduct self-check-ins and implement positive self-talk Exercise at [facility] 3-4 days/week  Progress Notes:  Patient stated that exercising is going well. Patient is engaged in [place] on Thursdays. Patient is interested in practicing some of her moves from class at home so that she can move to the mat in class. Patient stated that on Mondays she performs exercises that she learned from [place] in the [employer].   Patient is engaged in the [facility] class on Wednesdays. Patient shared that this class is a combination of aerobics, strength training, and walking. Patient finds that she likes group classes better than working out alone, which keeps her motivated to engage in physical activity. Patient also realizes that it's important that she has a variety of activities, so she does not become bored with working out.   Patient stated that her gratitude reflection is the best step that she implements. Patient stated that this step helps keeps her grounded and brings her peace. Patient has started practicing deep breathing along with this step. Patient  stated there were times when she felt stressed, but she was able to manage and get through by conducting self-check-ins and implementing positive self-talk.   Patient engaged in a church meeting that is gearing up to prepare a presentation for an upcoming community event. Patient has worked on her welcome sign for the event. Patient shared that will be participating in making treat bags for families for a Halloween event.   Patient stated that she did good with cooking for one week and was able to limit eating out to the weekends only. During this time, the patient baked or pan sear her meats. Patient shared that last week she was thrown off her cooking schedule because she was traveling back and forth to [place] to prepare a house for sale. Patient stated that during this time she also experienced some depression because of things that she was having to prepare to get rid of that belonged to her late mother and husband. Patient was able to get through this time by using positive self-talk. Patient mentioned that she is getting back on schedule with eating properly.  Patient stated that there were times that she brought a bag of food with her from home so that she didn't have to bother eating out every day with her family as they cleaned the house. Patient stated that at first, she didn't think to bring things with her. Patient reported that when  she was forced to eat out, she made the healthiest choices possible. Patient shared that she took the skin off fried chicken so that she wouldn't eat so much of the fat, but it was still too salty for her.   Patient stated that she is still reading food labels to monitor sodium, sugar, and carbohydrates. Patient shared an incident where she passed up eating cupcakes and ate a meal instead so that she could make a better food choice. Patient revealed that she is no longer buying snacks. Patient reported that she is drinking between 50-60 oz of water per day.  Patient finds herself not drinking all her tea. Patient has been focused on reducing her water retention.   Indicators of Success and Accountability:  Patient stated that getting out of the house, planning ahead with food and exercise routine, and making her own decisions are her indicators of success and accountability.  Readiness: Patient is in the action stage of stress management, reducing sodium consumption, lowering her A1C and LDL. Strengths and Supports: Patient is being supported by family and friends. Patient's confidence has increased.  Challenges and Barriers: Patient does not foresee any challenges to implementing her action steps over the next two weeks.   Coaching Outcomes: Patient feels that her confidence is getting better at making decisions for herself and has become more engaged in the community and exercise than she had in years.   Attempted: Fulfilled - Patient is reading food labels, drinking 3-16 oz bottles of water, bake or pan sear  meats instead of frying it. Patient has set boundaries around eating out when with family and friends. Patient is utilizing her support system, conducting her gratitude reflections, engaging in church activities, conducting self-check-ins, and implementing positive self-talk, and exercise at the [facility] 3 times a week.  Partial - Patient is drinking 1/2 - 1 cup of tea per day. Patient was able to limit her fast-food consumption for one week, patient was able to cook at home during that week.

## 2021-03-12 DIAGNOSIS — M9903 Segmental and somatic dysfunction of lumbar region: Secondary | ICD-10-CM | POA: Diagnosis not present

## 2021-03-12 DIAGNOSIS — M9902 Segmental and somatic dysfunction of thoracic region: Secondary | ICD-10-CM | POA: Diagnosis not present

## 2021-03-12 DIAGNOSIS — M9905 Segmental and somatic dysfunction of pelvic region: Secondary | ICD-10-CM | POA: Diagnosis not present

## 2021-03-12 DIAGNOSIS — M5032 Other cervical disc degeneration, mid-cervical region, unspecified level: Secondary | ICD-10-CM | POA: Diagnosis not present

## 2021-03-12 DIAGNOSIS — S29012A Strain of muscle and tendon of back wall of thorax, initial encounter: Secondary | ICD-10-CM | POA: Diagnosis not present

## 2021-03-12 DIAGNOSIS — M47817 Spondylosis without myelopathy or radiculopathy, lumbosacral region: Secondary | ICD-10-CM | POA: Diagnosis not present

## 2021-03-12 DIAGNOSIS — M9901 Segmental and somatic dysfunction of cervical region: Secondary | ICD-10-CM | POA: Diagnosis not present

## 2021-03-12 DIAGNOSIS — M5136 Other intervertebral disc degeneration, lumbar region: Secondary | ICD-10-CM | POA: Diagnosis not present

## 2021-03-14 ENCOUNTER — Ambulatory Visit
Admission: RE | Admit: 2021-03-14 | Discharge: 2021-03-14 | Disposition: A | Discharge status: Home / Self Care | Payer: Medicare PPO | Source: Ambulatory Visit | Attending: Nurse Practitioner | Admitting: Nurse Practitioner

## 2021-03-14 DIAGNOSIS — Z1231 Encounter for screening mammogram for malignant neoplasm of breast: Secondary | ICD-10-CM | POA: Diagnosis not present

## 2021-03-21 ENCOUNTER — Telehealth: Payer: Self-pay

## 2021-03-21 ENCOUNTER — Ambulatory Visit: Payer: Medicare PPO

## 2021-03-21 DIAGNOSIS — Z0000 Encounter for general adult medical examination without abnormal findings: Secondary | ICD-10-CM

## 2021-03-21 NOTE — Telephone Encounter (Signed)
Called patient for scheduled health coaching session. Patient was not able to talk at the time and requested to reschedule. Patient's appointment has been moved to 11/11 at 2:15pm. Patient will be called at scheduled time.    Zygmunt Mcglinn Truman Hayward, [facility] Guide, Health Coach [address], [address] [place] [address] Telephone: [phone] Email: [email or web address]

## 2021-03-22 ENCOUNTER — Other Ambulatory Visit: Payer: Self-pay

## 2021-03-22 ENCOUNTER — Ambulatory Visit (INDEPENDENT_AMBULATORY_CARE_PROVIDER_SITE_OTHER): Payer: Medicare PPO

## 2021-03-22 DIAGNOSIS — Z0000 Encounter for general adult medical examination without abnormal findings: Secondary | ICD-10-CM

## 2021-03-22 NOTE — Progress Notes (Signed)
Appointment Outcome: Completed, Session #: 6 Start time: 2:16pm   End time: 2:56pm   Total Mins: 40 minutes  AGREEMENTS SECTION   Overall Goal(s): Stress management Reduce sodium consumption Lower A1C Lower LDL                                          Agreement/Action Steps:    Reduce Sodium Consumption/lower A1C/lower LDL Read food labels to monitor sodium intake Drink 2-8 oz. cups of tea with lemon daily Drink 3-16 oz. bottles of water daily Bake food instead of frying Limit eating out to only weekends and cook Monday - Friday Set boundaries around healthy eating when out with family and friends   Stress Management Utilize support system Conduct gratitude reflections (3 things grateful for and/or 3 things was successful at doing) Set an alarm for 5:30pm to start writing for 10-15 minutes  Engage in church activities Conduct self-check-ins and implement positive self-talk Exercise at [facility] 3-4 days/week  Progress Notes:  Patient has been reading food labels on cans and bottles, but just recently started reading the labels on frozen food to monitor sodium consumption. Patient stated that she did not realize how much sodium was in frozen vegetables until now and stated that she will be more mindful of what she is purchasing. Patient stated that she will also go grocery shopping when she is not hungry or feeling rush, therefore, she will not be tempting to buy whatever she sees. Patient shared an example of cream spinach that she normally does not buy and mentioned that she did not read the label in the store at the time. Patient mentioned that if she had known that the product had 250 mg of sodium per serving, then she would have purchased plain spinach.   Patient is drinking all three bottles of water. However, patient stated that she only drunk tea one week because she came across a coffee pot when cleaning and decided to use it and have been drinking coffee since.  Patient stated that this is not good and does not go with her routine of fluids she drinks. Patient mentioned that she noticed a difference in her body from drinking coffee and had to stop. Patient stated that if she has not drunk all her water during the daytime, her body will let her know (e.g., cramps) and she finds herself hydrating throughout the night. Patient stated that she tries to avoid this from happening by drinking her water earlier in the day.   Patient continues to bake or pan-sear her meats. Patient stated that she had to get in her mind that she cannot eat fried foods every day. Patient stated that she is even working on snacking. Patient stated that she will purchase other meats that she can get use to baking such as salmon to eat. Patient has limited her eating out in the past two weeks to just the weekend. Patient stated that she has been cooking more at home. Patient stated that she would even prepare food to take with her on her trips out of town, in addition to water.   Patient shared that these strategies helped her stay on track and wasn't tempted to go through the drive through. Patient stated that she is pacing herself and tell herself that she does not have to go through the drive through but can wait to eat fresh  at home. Patient has set boundaries around what she eats when she is out with others. Patient feels more comfortable making decisions that are different from family and friends when choosing what to eat at fast food restaurants.   Patient stated that she is [facility] 3 [facility] a [facility] at [facility]. Patient stated that exercising is a new thing in her life that she looks forward to. Patient expressed that she is glad that she had to disclose where she was going to work out at after [employer] because otherwise, she would not have gone. Patient shared that what she likes the most is meeting new people her age and creating friendships. Patient stated that there is a group  that she sees once or twice a week and likes becoming part of things that she wasn't before such as going out with friends and working out together.  Patient mentioned that her church sign is complete and that they were supposed to have the community event today but had to postpone it due to the weather. Patient expressed that she is excited. The church will be hosting this community event at least twice a month for new families that are moving into the neighborhood. Patient continues to conduct gratitude reflections at 5:30pm when her alarm goes off. Patient shared that this is a step that she will always continue implementing because it helps her bring closure to her day or reminds her of something that she may still need to do. Patient stated that she used to write the notes everywhere but that wasn't working for her so now she verbally conducts her gratitude reflections.   Patient shared an incident where she became frustrated with herself because it was taking her longer to complete a task than normal. Patient stated that she had to conduct a check-in and implement positive self-talk to get herself through that moment. Patient stated that she asked herself why she was frustrated. Patient stated that it took her some time to identify why she was in that state of mind but was able to talk to herself and worked through it.   Patient reported that she is receiving a lot of support right now. Patient is in the process of cleaning out her old house and will be giving away belongings of her late mother and husband. Patient stated that it has taken her some time to get to this point to separate from their things but have received tremendous support in doing so. Patient stated that her family helps her two days out of the week with the cleaning as well. Patient stated that she had to get out of the mindset that she had to save things and that she wasn't ready to move forward.     Indicators of Success and  Accountability:  Patient has become more consistent with staying on tasks and having a plan ahead of time. Patient is using check-ins and positive self-talk to manage stress better. Patient feels that she is calmer in her decision-making. Readiness: Patient is in the action stage of stress management, reducing sodium consumption, lower A1C and LDL. Strengths and Supports: Patient is being supported by family and friends. Patient is calmer and more practical, in addition to an increase in her reasoning skills. Challenges and Barriers: Patient does not foresee any challenges to implementing her action steps over the next two weeks.   Coaching Outcomes: Patient will continue to implement her action steps as outlined above over the next two weeks.   Patient  has recognized that she has been planning ahead to aid in reducing stress that she may encounter. Patient has become more comfortable with her decision making and not relying on others to decided things for her.   Patient stated that everything uses to be a problem, but now she has a process to walk through things.   Attempted: Fulfilled - Patient is reading food labels to monitor sodium intake, patient has been drinking 48 oz of water daily, patient is baking food instead of frying her meats, patient has been able to limit her fast-food consumption by cooking at home during the week and has set boundaries around what she eats when she is out with family and friends. Patient has been able to continue utilizing her support system, conduct her gratitude reflections daily, engage in church activities, and exercise at the [facility] 3 days a week.  Partial - Patient drunk tea for one week, but not during the second week due to drinking coffee.

## 2021-03-27 DIAGNOSIS — E785 Hyperlipidemia, unspecified: Secondary | ICD-10-CM | POA: Diagnosis not present

## 2021-03-27 DIAGNOSIS — I2510 Atherosclerotic heart disease of native coronary artery without angina pectoris: Secondary | ICD-10-CM | POA: Diagnosis not present

## 2021-03-27 DIAGNOSIS — I10 Essential (primary) hypertension: Secondary | ICD-10-CM | POA: Diagnosis not present

## 2021-03-27 DIAGNOSIS — M109 Gout, unspecified: Secondary | ICD-10-CM | POA: Diagnosis not present

## 2021-03-27 DIAGNOSIS — E669 Obesity, unspecified: Secondary | ICD-10-CM | POA: Diagnosis not present

## 2021-03-27 DIAGNOSIS — E1142 Type 2 diabetes mellitus with diabetic polyneuropathy: Secondary | ICD-10-CM | POA: Diagnosis not present

## 2021-03-27 DIAGNOSIS — E1165 Type 2 diabetes mellitus with hyperglycemia: Secondary | ICD-10-CM | POA: Diagnosis not present

## 2021-03-27 DIAGNOSIS — E1136 Type 2 diabetes mellitus with diabetic cataract: Secondary | ICD-10-CM | POA: Diagnosis not present

## 2021-03-27 DIAGNOSIS — F325 Major depressive disorder, single episode, in full remission: Secondary | ICD-10-CM | POA: Diagnosis not present

## 2021-04-02 ENCOUNTER — Other Ambulatory Visit: Payer: Self-pay

## 2021-04-02 ENCOUNTER — Other Ambulatory Visit: Payer: Self-pay | Admitting: Nurse Practitioner

## 2021-04-02 MED ORDER — FREESTYLE LIBRE 14 DAY SENSOR MISC: 1 refills | Status: DC

## 2021-04-02 MED ORDER — FREESTYLE LIBRE 14 DAY READER DEVI: 1 refills | Status: DC

## 2021-04-03 ENCOUNTER — Ambulatory Visit (INDEPENDENT_AMBULATORY_CARE_PROVIDER_SITE_OTHER): Payer: Medicare PPO

## 2021-04-03 ENCOUNTER — Other Ambulatory Visit: Payer: Self-pay

## 2021-04-03 DIAGNOSIS — Z0000 Encounter for general adult medical examination without abnormal findings: Secondary | ICD-10-CM

## 2021-04-03 NOTE — Progress Notes (Signed)
Appointment Outcome: Completed, Session #: 7 Start time: 2:16pm   End time: 2:51   Total Mins: 34 minutes  AGREEMENTS SECTION   Overall Goal(s): Stress management Reduce sodium consumption Lower A1C Lower LDL                                          Agreement/Action Steps:    Reduce Sodium Consumption/lower A1C/lower LDL Read food labels to monitor sodium intake Drink 2-8 oz. cups of tea with lemon daily Drink 3-16 oz. bottles of water daily Bake food instead of frying Limit eating out to only weekends and cook Monday - Friday Set boundaries around healthy eating when out with family and friends   Stress Management Utilize support system Conduct gratitude reflections (3 things grateful for and/or 3 things was successful at doing) Set an alarm for 5:30pm to start writing for 10-15 minutes  Engage in church activities Conduct self-check-ins and implement positive self-talk Exercise at [facility] 3-4 days/week  Progress Notes:  Patient reported that since reading food labels she has decided to purchase fresh produce instead of purchasing canned or frozen foods. Patient shared that she has started with various greens, salads, squash, and zucchini. Patient stated that she is buying according to her budget and may purchase organic products when possible. Patient shared that she has been baking, broiling, and selecting grilled meats. Patient shared that she has informed her sister-in-law when out together that they are not going to eat fast food or go through the drive through. Patient stated that she has been able to cook throughout the week but when eating out on the weekends, she has gone to restaurants that she was able to make healthy food choices.  Patient shared that she is having a hard time remember to drink all of her water in a days' time. Patient stated that she still marks her bottles, but that strategy does not work for her anymore. Patient stated that she finds  herself drinking 1 - 1.5 bottles of water in the evening and will be up throughout the night using the bathroom. Patient has resumed drinking 1 cup of coffee per week but has replaced drinks with caffeine when she eats to either water with lemon and honey, or plain water instead of soft drinks and sweet teas. Patient mentioned that she uses a creamer that is sugar free.   Patient stated that she has been enjoying her moments when she does her gratitude reflections at 5:30pm. Patient stated that if she is driving or out somewhere at that time, she does a quick verbal reflection, otherwise, at home she makes notes to herself in addition to the verbal gratification reflection. Patient stated that it helps her stay on task and sometimes reminds her of things that she needs to do for herself.   Patient stated that she is trying to remain active. Patient has been going to the [facility] as agreed except for the past week she was only able to attend twice. Patient stated that she has made a new friend that she has gone out with and attend class with once a week at the center. Patient stated that she has been attending a senior tech program called Providing Equal Access to Technology (PEAT) that has been provided by the [facility] for [facility]. Upon completing the class, the patient received a refurbished computer. Patient is planning  on attending the second portion of the class this winter. Patient shared that she will be attending a church meeting on Nov. 29th to finalize the community event that they planned to greet new families to the community. Patient is looking forward to being engaged with this event.   Overall, patient has been able to manage her stress by conducting self-check-ins and implementing positive self-talk. Patient stated that she is in a place where she doesn't feel dependent upon her son to make decisions for her. Patient stated that her son does encourage her to think and do for  herself. Patient shared that she was finally in a place she can go to her old home in [place] to clean out her husband and mother's belongings. Patient stated that she was in the mental and emotional place to do it alone now without feeling down. Patient expressed that she truly feels that she has made a lot of mental and emotional progress.   Indicators of Success and Accountability:  Patient stated that completing the tech class and being mentally/emotionally able to clean out her home alone 3 years after her husband's death are her indicators of success and accountability.   Readiness: Patient is in the action stage of stress management, reducing caffeine, consumption, lowering her A1C and LDL.  Strengths and Supports: Patient is being supported by family and friends.  Challenges and Barriers: Patient has been forgetting to drink water and sees that as the only challenge in the next month.   Coaching Outcomes: Patient was informed that she is moving into the follow-up phase of health coaching and expressed verbal understanding.  Patient will purchase a water bottle that she can carry around with her to aid in consuming 3-16.9 oz water bottles a day. Patient will continue to implement her action steps as outlined above.  Attempted: Fulfilled - Patient was able to complete the following steps as agreed upon.   Partial - Patient has been drinking tea approximately 6 days a week and has not been able to consume all 3 bottles of water daily. Patient was only able to go to the [facility] twice in the last week.

## 2021-04-09 DIAGNOSIS — S29012A Strain of muscle and tendon of back wall of thorax, initial encounter: Secondary | ICD-10-CM | POA: Diagnosis not present

## 2021-04-09 DIAGNOSIS — M9903 Segmental and somatic dysfunction of lumbar region: Secondary | ICD-10-CM | POA: Diagnosis not present

## 2021-04-09 DIAGNOSIS — M5032 Other cervical disc degeneration, mid-cervical region, unspecified level: Secondary | ICD-10-CM | POA: Diagnosis not present

## 2021-04-09 DIAGNOSIS — M47817 Spondylosis without myelopathy or radiculopathy, lumbosacral region: Secondary | ICD-10-CM | POA: Diagnosis not present

## 2021-04-09 DIAGNOSIS — M9905 Segmental and somatic dysfunction of pelvic region: Secondary | ICD-10-CM | POA: Diagnosis not present

## 2021-04-09 DIAGNOSIS — M9902 Segmental and somatic dysfunction of thoracic region: Secondary | ICD-10-CM | POA: Diagnosis not present

## 2021-04-09 DIAGNOSIS — M5136 Other intervertebral disc degeneration, lumbar region: Secondary | ICD-10-CM | POA: Diagnosis not present

## 2021-04-09 DIAGNOSIS — M9901 Segmental and somatic dysfunction of cervical region: Secondary | ICD-10-CM | POA: Diagnosis not present

## 2021-04-13 NOTE — Progress Notes (Signed)
Chart reviewed, agree above plan ?

## 2021-05-01 ENCOUNTER — Ambulatory Visit: Payer: Medicare PPO | Admitting: Nurse Practitioner

## 2021-05-02 ENCOUNTER — Other Ambulatory Visit: Payer: Self-pay

## 2021-05-02 ENCOUNTER — Ambulatory Visit (INDEPENDENT_AMBULATORY_CARE_PROVIDER_SITE_OTHER): Payer: Medicare PPO

## 2021-05-02 ENCOUNTER — Ambulatory Visit: Payer: Medicare PPO | Admitting: Nurse Practitioner

## 2021-05-02 DIAGNOSIS — Z0000 Encounter for general adult medical examination without abnormal findings: Secondary | ICD-10-CM

## 2021-05-02 NOTE — Progress Notes (Signed)
Appointment Outcome: Completed, Session #: 94-month f/u Start time: 2:16pm   End time: 3:01pm   Total Mins: 45 minutes  AGREEMENTS SECTION   Overall Goal(s): Stress management Reduce sodium consumption Lower A1C Lower LDL                                          Agreement/Action Steps:    Reduce Sodium Consumption/lower A1C/lower LDL Read food labels to monitor sodium intake Drink 2-8 oz. cups of tea with lemon daily Drink 3-16 oz. bottles of water daily Bake food instead of frying Limit eating out to only weekends and cook Monday - Friday Set boundaries around healthy eating when out with family and friends   Stress Management Utilize support system Conduct gratitude reflections (3 things grateful for and/or 3 things was successful at doing) Set an alarm for 5:30pm to start writing for 10-15 minutes  Engage in church activities Conduct self-check-ins and implement positive self-talk Exercise at [facility] 3-4 days/week  Progress Notes:  Patient stated that she continues to read food labels. Patient shared that she is finding herself spending more time in the grocery store because she is taking the time to investigate the contents of her food choices.   Patient has invested in a water container that is marked to remind her to drink every two hours. Patient stated that the bottle holds 100 oz and did not realize that she was consuming that much until she noticed that she was using the bathroom frequently. Patient has cut back to drinking 64 ounces of water per day instead. Patient has decided to drink water at restaurants instead of soda or tea to aid with meeting her water goal. Patient continues to drink her tea with lemon daily.   Patient reported that she has not baked any food since the last session that she can remember. Patient stated that she has been pan searing her meats instead of frying it. Patient reported two occasions that she ate out during the week during  the past month. Patient stated that she went shopping and had a biscuit with an over medium egg and ham. Patient stated that she only at the top part of the biscuit because she is trying to cut down on her carbs. Patient mentioned that when she was out with her son, they had a salad, ribeye steak with vegetables. Patient stated that she continues to put boundaries around what she eats and where she eats when she is out.  Patient has been utilizing her support system. Patient shared incidences when she is interacting with her support system that she must be more assertive and express herself with confidence. Patient stated that she is in a space where she doesn't need to be validated by others or need other's approval before she makes a final decision on something.   Patient continues to engage in her gratitude reflections at 5:30pm. Patient stated that this time reminds her of what she has done during the day and remains to be done. Patient mentioned that sometimes her reflections last longer than 10-15 minutes. Patient finds herself talking out her reflections and praying. Patient stated that this step is beneficial because it helps her to relax and as a result, she is more relaxed to go to bed around 8-9pm compared to before.  Patient is waiting for the church event that has been planned to start  after the new year. Patient explained that she attended a church meeting and learned that the event had been postponed again because the clubhouse isn't ready where they will be located. Patient stated that the sign is ready, and she is excited to engage in the event whenever they can schedule it.   Patient has been conducted self-check-ins on a regular basis to acknowledge how she is feeling and thinking and to determine what she needs to do in different moments when she may feel stressed. Patient implements positive self-talk to encourage herself to trust herself, be assertive, and to think through situations  she my find stressful.   Patient reported that she has been able to go to the rec center 3 days a week in the past four weeks. Patient mentioned that she has only gone one time this week because of the weather. Patient stated that she tries to attend classes between 9:30-10:30am, and at the latest, 11:00am. Patient plans to go to the gym in the rec center to walk since they are not currently holding classes due to the holidays.   Indicators of Success and Accountability: Patient stated that learning to order within reason and choosing the healthier options when she eats out is her indicator of success and accountability.  Readiness: Patient is in the action phase of stress management, reducing sodium consumption, lowering her LDL and A1C. Strengths and Supports: Patient is being supported by family and friends. Patient has become more self-reliant and assertive. Patient is intentional about her behavior and implementing her action steps.  Challenges and Barriers: Patient does not foresee any challenges to implementing her action steps over the next three months.   Coaching Outcomes: Patient will continue to implement action steps as outlined above over the next three months until the next follow up.   Patient has not made any changes to her action steps.   Attempted: Fulfilled - Patient has been able to implement the following steps as agreed upon:  Reduce Sodium Consumption/lower A1C/lower LDL Read food labels to monitor sodium intake Drink 2-8 oz. cups of tea with lemon daily Drink 3-16 oz. bottles of water daily Bake food instead of frying Set boundaries around healthy eating when out with family and friends   Stress Management Utilize support system Conduct gratitude reflections (3 things grateful for and/or 3 things was successful at doing) Set an alarm for 5:30pm to start writing for 10-15 minutes  Engage in church activities Conduct self-check-ins and implement positive  self-talk Exercise at [facility] 3-4 days/week     Partial - Patient has eating out twice during the week in the past month.

## 2021-05-14 DIAGNOSIS — S29012A Strain of muscle and tendon of back wall of thorax, initial encounter: Secondary | ICD-10-CM | POA: Diagnosis not present

## 2021-05-14 DIAGNOSIS — M9903 Segmental and somatic dysfunction of lumbar region: Secondary | ICD-10-CM | POA: Diagnosis not present

## 2021-05-14 DIAGNOSIS — M9902 Segmental and somatic dysfunction of thoracic region: Secondary | ICD-10-CM | POA: Diagnosis not present

## 2021-05-14 DIAGNOSIS — M9905 Segmental and somatic dysfunction of pelvic region: Secondary | ICD-10-CM | POA: Diagnosis not present

## 2021-05-14 DIAGNOSIS — M9901 Segmental and somatic dysfunction of cervical region: Secondary | ICD-10-CM | POA: Diagnosis not present

## 2021-05-14 DIAGNOSIS — M5032 Other cervical disc degeneration, mid-cervical region, unspecified level: Secondary | ICD-10-CM | POA: Diagnosis not present

## 2021-05-14 DIAGNOSIS — M5136 Other intervertebral disc degeneration, lumbar region: Secondary | ICD-10-CM | POA: Diagnosis not present

## 2021-05-14 DIAGNOSIS — M47817 Spondylosis without myelopathy or radiculopathy, lumbosacral region: Secondary | ICD-10-CM | POA: Diagnosis not present

## 2021-05-30 DIAGNOSIS — L661 Lichen planopilaris: Secondary | ICD-10-CM | POA: Diagnosis not present

## 2021-05-30 DIAGNOSIS — L668 Other cicatricial alopecia: Secondary | ICD-10-CM | POA: Diagnosis not present

## 2021-06-05 ENCOUNTER — Other Ambulatory Visit: Payer: Self-pay

## 2021-06-05 MED ORDER — METFORMIN HCL ER 500 MG PO TB24: 500.0000 mg | ORAL_TABLET | Freq: Two times a day (BID) | ORAL | 1 refills | Status: DC

## 2021-06-05 MED ORDER — ALLOPURINOL 100 MG PO TABS: 100.0000 mg | ORAL_TABLET | Freq: Every day | ORAL | 0 refills | Status: DC

## 2021-06-05 MED ORDER — AMLODIPINE BESYLATE 10 MG PO TABS: 10.0000 mg | ORAL_TABLET | Freq: Every day | ORAL | 2 refills | Status: DC

## 2021-06-06 ENCOUNTER — Ambulatory Visit: Payer: Medicare PPO | Admitting: Nurse Practitioner

## 2021-06-06 ENCOUNTER — Other Ambulatory Visit: Payer: Self-pay

## 2021-06-06 ENCOUNTER — Other Ambulatory Visit (HOSPITAL_BASED_OUTPATIENT_CLINIC_OR_DEPARTMENT_OTHER): Payer: Self-pay | Admitting: *Deleted

## 2021-06-06 ENCOUNTER — Encounter: Payer: Self-pay | Admitting: Nurse Practitioner

## 2021-06-06 VITALS — BP 118/64 | HR 75 | Temp 97.9°F | Ht 63.0 in | Wt 186.6 lb

## 2021-06-06 DIAGNOSIS — L659 Nonscarring hair loss, unspecified: Secondary | ICD-10-CM

## 2021-06-06 DIAGNOSIS — I10 Essential (primary) hypertension: Secondary | ICD-10-CM

## 2021-06-06 DIAGNOSIS — E6609 Other obesity due to excess calories: Secondary | ICD-10-CM | POA: Diagnosis not present

## 2021-06-06 DIAGNOSIS — E785 Hyperlipidemia, unspecified: Secondary | ICD-10-CM

## 2021-06-06 DIAGNOSIS — E782 Mixed hyperlipidemia: Secondary | ICD-10-CM

## 2021-06-06 DIAGNOSIS — Z6833 Body mass index (BMI) 33.0-33.9, adult: Secondary | ICD-10-CM | POA: Diagnosis not present

## 2021-06-06 DIAGNOSIS — E1169 Type 2 diabetes mellitus with other specified complication: Secondary | ICD-10-CM | POA: Diagnosis not present

## 2021-06-06 MED ORDER — CARVEDILOL 6.25 MG PO TABS: 6.2500 mg | ORAL_TABLET | Freq: Two times a day (BID) | ORAL | 1 refills | Status: DC

## 2021-06-06 NOTE — Telephone Encounter (Signed)
Rx(s) sent to pharmacy electronically.  

## 2021-06-06 NOTE — Patient Instructions (Signed)

## 2021-06-06 NOTE — Progress Notes (Signed)
I,Yamilka J Llittleton,acting as a Education administrator for [employer], FNP.,have documented all relevant documentation on the behalf of [name], FNP,as directed by  [name], FNP while in the presence of [name], [place].   This visit occurred during the SARS-CoV-2 public health emergency.  Safety protocols were in place, including screening questions prior to the visit, additional usage of staff PPE, and extensive cleaning of exam room while observing appropriate contact time as indicated for disinfecting solutions.  Subjective:     Patient ID: Jasmine Lopez , female    DOB: 1940-11-27 , 81 y.o.   MRN: [id number]   Chief Complaint  Patient presents with   Diabetes    HPI  Pt here today for diabetes and hypertension follow up. She has seen   Diabetes She presents for her follow-up diabetic visit. She has type 2 diabetes mellitus. Her disease course has been stable. Pertinent negatives for hypoglycemia include no dizziness or headaches. Pertinent negatives for diabetes include no chest pain, no fatigue, no polydipsia, no polyphagia and no polyuria. There are no hypoglycemic complications. There are no diabetic complications. Risk factors for coronary artery disease include obesity, hypertension and dyslipidemia. Current diabetic treatment includes oral agent (monotherapy). She is compliant with treatment most of the time. She is following a generally healthy (she reads her labels ) diet. When asked about meal planning, she reported none. She has not had a previous visit with a dietitian. She participates in exercise intermittently. (Averaging 107- 130. ) An ACE inhibitor/angiotensin II receptor blocker is being taken. She does not see a podiatrist.Eye exam is current.    Past Medical History:  Diagnosis Date   Anxiety    Breast cancer ([place])    Cancer ([place])    Diabetes mellitus without complication ([place])    Dysphagia    GERD (gastroesophageal reflux disease) 10/12/2020   Hair loss  10/12/2020   Heart murmur    Hx of adenomatous polyp of colon 06/2015   [name]- [facility]   Hypertension    Irregular heart beat    Sleep apnea      Family History  Problem Relation Age of Onset   Heart failure Mother        CAD   COPD Mother    Hypertension Mother    Cancer Father    Rectal cancer Maternal Grandmother        Unsure   Colon cancer Maternal Grandmother    Heart attack Maternal Grandfather    Esophageal cancer Neg Hx    Stomach cancer Neg Hx      Current Outpatient Medications:    Multiple Vitamin (MULTIVITAMIN) capsule, Take 1 capsule by mouth daily., Disp: , Rfl:    Accu-Chek Softclix Lancets lancets, Use to check blood sugars twice daily E11.69, Disp: 100 each, Rfl: 2   acetaminophen (TYLENOL) 500 MG tablet, Take 500 mg by mouth every 6 (six) hours as needed., Disp: , Rfl:    allopurinol (ZYLOPRIM) 100 MG tablet, Take 1 tablet (100 mg total) by mouth daily., Disp: 90 tablet, Rfl: 0   amLODipine (NORVASC) 10 MG tablet, Take 1 tablet (10 mg total) by mouth daily., Disp: 90 tablet, Rfl: 2   Biotin 1000 MCG tablet, biotin, Disp: , Rfl:    blood glucose meter kit and supplies KIT, Dispense based on patient and insurance preference. Use up to two times daily as directed. (FOR ICD-9 250.00, 250.01)., Disp: 1 each, Rfl: 0   buPROPion (WELLBUTRIN XL) 150 MG  24 hr tablet, TAKE 1 TABLET(150 MG) BY MOUTH EVERY MORNING, Disp: 90 tablet, Rfl: 1   carvedilol (COREG) 6.25 MG tablet, Take 1 tablet (6.25 mg total) by mouth 2 (two) times daily with a meal., Disp: 180 tablet, Rfl: 2   Cholecalciferol (VITAMIN D3) 1000000 UNIT/GM LIQD, Vitamin D3, Disp: , Rfl:    Cholecalciferol (VITAMIN D3) 2000 units TABS, Take by mouth., Disp: , Rfl:    clobetasol (TEMOVATE) 0.05 % external solution, SMARTSIG:1 Sparingly Topical Twice Daily, Disp: , Rfl:    Continuous Blood Gluc Receiver (FREESTYLE LIBRE 14 DAY READER) DEVI, Use as directed to check blood sugars 2 times per day dx:  e11.9, Disp: 1 each, Rfl: 1   Continuous Blood Gluc Sensor (FREESTYLE LIBRE 14 DAY SENSOR) MISC, Use as directed to check blood sugars 2 times per day dx: e11.9, Disp: 2 each, Rfl: 1   Garlic 9371 MG CAPS, Take 1 capsule by mouth daily., Disp: , Rfl:    glucose blood (ACCU-CHEK SMARTVIEW) test strip, Use to check blood sugars twice daily E11.69, Disp: 100 each, Rfl: 2   metFORMIN (GLUCOPHAGE-XR) 500 MG 24 hr tablet, Take 1 tablet (500 mg total) by mouth 2 (two) times daily., Disp: 180 tablet, Rfl: 1   rosuvastatin (CRESTOR) 20 MG tablet, TAKE 1 TABLET(20 MG) BY MOUTH DAILY, Disp: 90 tablet, Rfl: 3   valsartan-hydrochlorothiazide (DIOVAN-HCT) 160-25 MG tablet, TAKE 1 TABLET BY MOUTH DAILY, Disp: 30 tablet, Rfl: 6  Current Facility-Administered Medications:    0.9 %  sodium chloride infusion, 500 mL, Intravenous, Once, Armbruster, Carlota Raspberry, MD   Allergies  Allergen Reactions   Ace Inhibitors Cough   Atorvastatin Other (See Comments)    Leg pain    Benadryl [Diphenhydramine] Itching    Benadryl cream   Peppermint Flavor [Flavoring Agent]     Causes horsenss     Review of Systems  Constitutional: Negative.  Negative for fatigue.  Respiratory: Negative.    Cardiovascular: Negative.  Negative for chest pain.  Gastrointestinal: Negative.   Endocrine: Negative for polydipsia, polyphagia and polyuria.  Skin:        Hair loss  Neurological: Negative.  Negative for dizziness and headaches.    Today's Vitals   06/06/21 1217  BP: 118/64  Pulse: 75  Temp: 97.9 F (36.6 C)  TempSrc: Oral  Weight: 186 lb 9.6 oz (84.6 kg)  Height: _0  (1.6 m)   Body mass index is 33.05 kg/m.  Wt Readings from Last 3 Encounters:  06/06/21 186 lb 9.6 oz (84.6 kg)  01/22/21 175 lb 12.8 oz (79.7 kg)  01/10/21 187 lb (84.8 kg)    Objective:  Physical Exam Vitals reviewed.  Constitutional:      General: She is not in acute distress.    Appearance: Normal appearance.  Cardiovascular:     Rate and  Rhythm: Normal rate and regular rhythm.     Pulses: Normal pulses.     Heart sounds: Normal heart sounds. No murmur heard. Pulmonary:     Effort: Pulmonary effort is normal. No respiratory distress.     Breath sounds: Normal breath sounds. No wheezing.  Skin:    General: Skin is warm and dry.     Capillary Refill: Capillary refill takes less than 2 seconds.     Comments: She does have hair thinning to the crown of her head  Neurological:     General: No focal deficit present.     Mental Status: She is alert and oriented to  person, place, and time.     Cranial Nerves: No cranial nerve deficit.     Motor: No weakness.  Psychiatric:        Mood and Affect: Mood normal.        Behavior: Behavior normal.        Thought Content: Thought content normal.        Judgment: Judgment normal.        Assessment And Plan:     1. Type 2 diabetes mellitus with hyperlipidemia ([place]) Comments: HgbA1c was elevated at last visit and admits to eating bad over the holiday. Encouraged to get back on track with her diet.  She completed a record request from Dr. Katy Fitch for her diabetes eye exam.  - Ambulatory referral to Podiatry - Comprehensive metabolic panel - Hemoglobin A1c - Creatinine, serum - Lipid panel  2. Essential hypertension Comments: Blood pressure is well controlled. Continue current medications.   3. Mixed hyperlipidemia Comments: Stable, continue statin; tolerating well - Lipid panel  4. Alopecia Comments: She reports being diagnosed by Dermatology with 2 types of alopecia. She is now using a cream for the last week  5. Class 1 obesity due to excess calories with serious comorbidity and body mass index (BMI) of 33.0 to 33.9 in adult She is encouraged to strive for BMI less than 30 to decrease cardiac risk. Advised to aim for at least 150 minutes of exercise per week.    Patient was given opportunity to ask questions. Patient verbalized understanding of the plan and was able to  repeat key elements of the plan. All questions were answered to their satisfaction.  [name], FNP   I, [name], FNP, have reviewed all documentation for this visit. The documentation on 06/06/21 for the exam, diagnosis, procedures, and orders are all accurate and complete.   IF YOU HAVE BEEN REFERRED TO A SPECIALIST, IT MAY TAKE 1-2 WEEKS TO SCHEDULE/PROCESS THE REFERRAL. IF YOU HAVE NOT HEARD FROM US/SPECIALIST IN TWO WEEKS, PLEASE GIVE [place] A CALL AT [phone].   THE PATIENT IS ENCOURAGED TO PRACTICE SOCIAL DISTANCING DUE TO THE COVID-19 PANDEMIC.

## 2021-06-07 ENCOUNTER — Encounter: Payer: Self-pay | Admitting: Nurse Practitioner

## 2021-06-07 ENCOUNTER — Other Ambulatory Visit: Payer: Self-pay

## 2021-06-07 DIAGNOSIS — F32A Depression, unspecified: Secondary | ICD-10-CM

## 2021-06-07 LAB — COMPREHENSIVE METABOLIC PANEL
ALT: 21 IU/L (ref 0–32)
AST: 18 IU/L (ref 0–40)
Albumin/Globulin Ratio: 1.8 (ref 1.2–2.2)
Albumin: 4.7 g/dL (ref 3.7–4.7)
Alkaline Phosphatase: 76 IU/L (ref 44–121)
BUN/Creatinine Ratio: 16 (ref 12–28)
BUN: 14 mg/dL (ref 8–27)
Bilirubin Total: 0.2 mg/dL (ref 0.0–1.2)
CO2: 25 mmol/L (ref 20–29)
Calcium: 9.7 mg/dL (ref 8.7–10.3)
Chloride: 102 mmol/L (ref 96–106)
Creatinine, Ser: 0.89 mg/dL (ref 0.57–1.00)
Globulin, Total: 2.6 g/dL (ref 1.5–4.5)
Glucose: 116 mg/dL — ABNORMAL HIGH (ref 70–99)
Potassium: 4.6 mmol/L (ref 3.5–5.2)
Sodium: 145 mmol/L — ABNORMAL HIGH (ref 134–144)
Total Protein: 7.3 g/dL (ref 6.0–8.5)
eGFR: 66 mL/min/{1.73_m2} (ref >59)

## 2021-06-07 LAB — LIPID PANEL
Chol/HDL Ratio: 2.3 ratio (ref 0.0–4.4)
Cholesterol, Total: 138 mg/dL (ref 100–199)
HDL: 60 mg/dL (ref >39)
LDL Chol Calc (NIH): 68 mg/dL (ref 0–99)
Triglycerides: 43 mg/dL (ref 0–149)
VLDL Cholesterol Cal: 10 mg/dL (ref 5–40)

## 2021-06-07 LAB — HEMOGLOBIN A1C
Est. average glucose Bld gHb Est-mCnc: 171 mg/dL
Hgb A1c MFr Bld: 7.6 % — ABNORMAL HIGH (ref 4.8–5.6)

## 2021-06-07 MED ORDER — BUPROPION HCL ER (XL) 150 MG PO TB24: ORAL_TABLET | ORAL | 1 refills | Status: DC

## 2021-06-07 MED ORDER — ROSUVASTATIN CALCIUM 20 MG PO TABS: ORAL_TABLET | ORAL | 3 refills | Status: DC

## 2021-06-08 ENCOUNTER — Encounter: Payer: Self-pay | Admitting: Nurse Practitioner

## 2021-06-10 ENCOUNTER — Telehealth (HOSPITAL_BASED_OUTPATIENT_CLINIC_OR_DEPARTMENT_OTHER): Payer: Self-pay | Admitting: Cardiovascular Disease

## 2021-06-10 MED ORDER — CARVEDILOL 6.25 MG PO TABS: 6.2500 mg | ORAL_TABLET | Freq: Two times a day (BID) | ORAL | 1 refills | Status: DC

## 2021-06-10 MED ORDER — VALSARTAN-HYDROCHLOROTHIAZIDE 160-25 MG PO TABS: 1.0000 | ORAL_TABLET | Freq: Every day | ORAL | 1 refills | Status: DC

## 2021-06-10 NOTE — Telephone Encounter (Signed)
Received fax from [facility] requesting refills for Carvedilol 6.25 mg and Valsartan-HCTZ 160-25. Rx requests sent to pharmacy.

## 2021-06-12 ENCOUNTER — Other Ambulatory Visit: Payer: Self-pay

## 2021-06-12 DIAGNOSIS — F32A Depression, unspecified: Secondary | ICD-10-CM

## 2021-06-17 ENCOUNTER — Ambulatory Visit: Payer: Medicare PPO | Admitting: Podiatry

## 2021-06-17 ENCOUNTER — Encounter (HOSPITAL_BASED_OUTPATIENT_CLINIC_OR_DEPARTMENT_OTHER): Payer: Self-pay | Admitting: Cardiovascular Disease

## 2021-06-17 ENCOUNTER — Encounter: Payer: Self-pay | Admitting: Podiatry

## 2021-06-17 ENCOUNTER — Ambulatory Visit (INDEPENDENT_AMBULATORY_CARE_PROVIDER_SITE_OTHER): Payer: Medicare PPO

## 2021-06-17 ENCOUNTER — Ambulatory Visit (HOSPITAL_BASED_OUTPATIENT_CLINIC_OR_DEPARTMENT_OTHER): Payer: Medicare PPO | Admitting: Cardiovascular Disease

## 2021-06-17 ENCOUNTER — Other Ambulatory Visit: Payer: Self-pay

## 2021-06-17 DIAGNOSIS — M79675 Pain in left toe(s): Secondary | ICD-10-CM

## 2021-06-17 DIAGNOSIS — M19071 Primary osteoarthritis, right ankle and foot: Secondary | ICD-10-CM | POA: Diagnosis not present

## 2021-06-17 DIAGNOSIS — B351 Tinea unguium: Secondary | ICD-10-CM | POA: Diagnosis not present

## 2021-06-17 DIAGNOSIS — M19079 Primary osteoarthritis, unspecified ankle and foot: Secondary | ICD-10-CM

## 2021-06-17 DIAGNOSIS — I10 Essential (primary) hypertension: Secondary | ICD-10-CM | POA: Diagnosis not present

## 2021-06-17 DIAGNOSIS — M79674 Pain in right toe(s): Secondary | ICD-10-CM | POA: Diagnosis not present

## 2021-06-17 DIAGNOSIS — E782 Mixed hyperlipidemia: Secondary | ICD-10-CM | POA: Diagnosis not present

## 2021-06-17 DIAGNOSIS — E1169 Type 2 diabetes mellitus with other specified complication: Secondary | ICD-10-CM

## 2021-06-17 DIAGNOSIS — E785 Hyperlipidemia, unspecified: Secondary | ICD-10-CM | POA: Diagnosis not present

## 2021-06-17 NOTE — Assessment & Plan Note (Signed)
Continue rosuvastatin.  She will get back onto her regular exercise routine and keep trying to work on her diet.  She appreciates working with her Engineer, maintenance.

## 2021-06-17 NOTE — Progress Notes (Signed)
°  Subjective:  Patient ID: Jasmine Lopez, female    DOB: [birth year],   MRN: [id number]  Chief Complaint  Patient presents with   Diabetes    Diabetic foot care, A1C  6.7    81 y.o. female presents for concern of thickened elongated and painful nails that are difficult to trim. Requesting to have them trimmed today. Patient relates she also has pain in the right foot in the arch of her foot. Relates it affects her balance. States she has a history of injury several years ago where something fell on the foot. Relates any burning or tingling in her feet. Patient is diabetic and last A1c was 6.7.   . Denies any other pedal complaints. Denies n/v/f/c.   Past Medical History:  Diagnosis Date   Anxiety    Breast cancer ([place])    Cancer ([place])    Diabetes mellitus without complication ([place])    Dysphagia    GERD (gastroesophageal reflux disease) 10/12/2020   Hair loss 10/12/2020   Heart murmur    Hx of adenomatous polyp of colon 06/2015   [name]- [facility]   Hypertension    Irregular heart beat    Sleep apnea     Objective:  Physical Exam: Vascular: DP/PT pulses 2/4 bilateral. CFT <3 seconds. Normal hair growth on digits. No edema.  Skin. No lacerations or abrasions bilateral feet. Nails 1-5 are thickened discolored and elongated with subungual debris.  Musculoskeletal: MMT 5/5 bilateral lower extremities in DF, PF, Inversion and Eversion. Deceased ROM in DF of ankle joint. Tender over the dorsal midfoot area. Pain through the medial arch around first TMTJ.  Neurological: Sensation intact to light touch.   Assessment:   1. Arthritis of midfoot   2. Type 2 diabetes mellitus with hyperlipidemia (H[facility])   3. Pain due to onychomycosis of toenails of both feet      Plan:  Patient was evaluated and treated and all questions answered. -Discussed and educated patient on diabetic foot care, especially with  regards to the vascular, neurological and musculoskeletal systems.   -Stressed the importance of good glycemic control and the detriment of not  controlling glucose levels in relation to the foot. -Discussed supportive shoes at all times and checking feet regularly.  -Mechanically debrided all nails 1-5 bilateral using sterile nail nipper and filed with dremel without incident  -Answered all patient questions -Discussed midfoot arthritis with patient and treatment options.  X-rays reviewed. No acute fractures or dislocations. Degenerative changes noted throughout midfoot.  Discussed NSAIDS, topicals, and possible injections.  Discussed stiff soled shoes and carbon fiber foot plate.  Discussed if pain does not improve can discuss surgical options.   -Patient to return  in 3 months for at risk foot care -Patient advised to call the office if any problems or questions arise in the meantime.   Lorenda Peck, DPM

## 2021-06-17 NOTE — Patient Instructions (Signed)
Medication Instructions:  Your physician recommends that you continue on your current medications as directed. Please refer to the Current Medication list given to you today.   *If you need a refill on your cardiac medications before your next appointment, please call your pharmacy*  Lab Work: NONE  Testing/Procedures: NONE  Follow-Up: At [facility], you and your health needs are our priority.  As part of our continuing mission to provide you with exceptional heart care, we have created designated Provider Care Teams.  These Care Teams include your primary Cardiologist (physician) and Advanced Practice Providers (APPs -  Physician Assistants and Nurse Practitioners) who all work together to provide you with the care you need, when you need it.  We recommend signing up for the patient portal called "MyChart".  Sign up information is provided on this After Visit Summary.  MyChart is used to connect with patients for Virtual Visits (Telemedicine).  Patients are able to view lab/test results, encounter notes, upcoming appointments, etc.  Non-urgent messages can be sent to your provider as well.   To learn more about what you can do with MyChart, go to [email or web address].    Your next appointment:   6 month(s)  The format for your next appointment:   In Person  Provider:   Skeet Latch, MD   Other Instructions Exercise recommendations: The American Heart Association recommends 150 minutes of moderate intensity exercise weekly. Try 30 minutes of moderate intensity exercise 4-5 times per week. This could include walking, jogging, or swimming.

## 2021-06-17 NOTE — Progress Notes (Signed)
Cardiology Office Note   Date:  06/17/2021   ID:  Jasmine Lopez, Jasmine Lopez 08-23-1940, MRN [id number]  PCP:  [name], FNP  Cardiologist:   Skeet Latch, MD   No chief complaint on file.   History of Present Illness: Jasmine Lopez is a 81 y.o. female with hypertension, breast cancer, and diabetes here for follow up.  She was initially seen 10/2017 for heart murmur.  Jasmine Lopez recently moved to [place] from [place].  Her husband passed 02/2017 then her mother passed 06/2017.  She moved to be closer to her sons.  She established care with her PCP and was noted to have a murmur.  Jasmine Lopez was seeing a cardiologist in [place] due to poorly controlled hypertension.  She had a stress test around 2017 that was reportedly negative for ischemia.  She was started on metoprolol which helped her to feel less short of breath.  She was referred for an echocardiogram 11/17/2017 that revealed LVEF 65 to 70% and no LVH or intracavitary gradient.  She had no valvular heart disease. Her BP was above goal so amlodipine was added.  She followed up with Kerin Ransom on 06/2018 and her BP was still elevated.  She had a hard time cutting chlorthalidone in half so it was discontinued.  Metoprolol was switched to carvedilol due to bradycardia.  Her BP was a little elevated 02/2019, but this was 2/2 steroid use and she was only taking carvedilol once daily.  She struggled with cough and has been diagnosed with vocal cord dysfunction.  She gets rehab and has GERD that is being treated.  This has helped her cough.  She has noted significant improvement in the symptoms.  She occasionally has heartburn symptoms that occur when she eats a heavy meal and lays down.  Otherwise it is well-controlled.  She has no heartburn with exertion.  Jasmine Lopez's blood pressure was poorly controlled.  Amlodipine was increased to 10 mg.  Blood pressure was 140/68 when she followed up with our pharmacist.  Losartan was switched to  valsartan.  Since then her blood pressure has been well controlled.  She was also referred to the PREP program at the [facility].  She wants to participate but feels uncomfortable driving around town.  She had some word finding difficulty and was concerned about Alzheimer's.  She was referred to neurology and had an MRI of the brain that was unremarkable, showing only mild age-appropriate changes of chronic small vessel disease and supratentorial cortical atrophy.  She was started back on Wellbutrin and feels that her symptoms have improved significantly.  At her last appointment she was making some improvements with the help of her health coach.  Her blood pressure was slightly above goal but she wanted to keep working on diet and exercise instead of adding any new medications.  She complained of hair loss and her TSH was normal.  She was working on her diet but struggled with her diet during the holidays.  She has been going to the [facility] three days per week.  She feels good with exercise and has no exertional chest pain or shortness of breath.  She hasn't had any palpitations lately.  She enjoyed the PREP program.  At home her BP has been well-controlled.  It has been in the 110-130s.  She has occasional LE edema at times but no orthopnea or PND.  It usually happens when she is travelling and has fast food.    Past  Medical History:  Diagnosis Date   Anxiety    Breast cancer ([place])    Cancer ([place])    Diabetes mellitus without complication ([place])    Dysphagia    GERD (gastroesophageal reflux disease) 10/12/2020   Hair loss 10/12/2020   Heart murmur    Hx of adenomatous polyp of colon 06/2015   [name]- [facility]   Hypertension    Irregular heart beat    Sleep apnea     Past Surgical History:  Procedure Laterality Date   BREAST EXCISIONAL BIOPSY Bilateral    BREAST LUMPECTOMY Left 2016     Current Outpatient Medications  Medication Sig Dispense Refill   Accu-Chek Softclix  Lancets lancets Use to check blood sugars twice daily E11.69 100 each 2   acetaminophen (TYLENOL) 500 MG tablet Take 500 mg by mouth every 6 (six) hours as needed.     allopurinol (ZYLOPRIM) 100 MG tablet Take 1 tablet (100 mg total) by mouth daily. 90 tablet 0   amLODipine (NORVASC) 10 MG tablet Take 1 tablet (10 mg total) by mouth daily. 90 tablet 2   Biotin 1000 MCG tablet biotin     blood glucose meter kit and supplies KIT Dispense based on patient and insurance preference. Use up to two times daily as directed. (FOR ICD-9 250.00, 250.01). 1 each 0   buPROPion (WELLBUTRIN XL) 150 MG 24 hr tablet TAKE 1 TABLET(150 MG) BY MOUTH EVERY MORNING 90 tablet 1   carvedilol (COREG) 6.25 MG tablet Take 1 tablet (6.25 mg total) by mouth 2 (two) times daily with a meal. 180 tablet 1   Cholecalciferol (VITAMIN D3) 125 MCG (5000 UT) CAPS Take 1 capsule by mouth daily at 12 noon.     clobetasol (TEMOVATE) 0.05 % external solution SMARTSIG:1 Sparingly Topical Twice Daily     Continuous Blood Gluc Receiver (FREESTYLE LIBRE 14 DAY READER) DEVI Use as directed to check blood sugars 2 times per day dx: e11.9 1 each 1   Continuous Blood Gluc Sensor (FREESTYLE LIBRE 14 DAY SENSOR) MISC Use as directed to check blood sugars 2 times per day dx: V40.9 2 each 1   Garlic 8119 MG CAPS Take 1 capsule by mouth daily.     glucose blood (ACCU-CHEK SMARTVIEW) test strip Use to check blood sugars twice daily E11.69 100 each 2   metFORMIN (GLUCOPHAGE-XR) 500 MG 24 hr tablet Take 1 tablet (500 mg total) by mouth 2 (two) times daily. 180 tablet 1   Multiple Vitamin (MULTIVITAMIN) capsule Take 1 capsule by mouth daily.     rosuvastatin (CRESTOR) 20 MG tablet TAKE 1 TABLET(20 MG) BY MOUTH DAILY 90 tablet 3   valsartan-hydrochlorothiazide (DIOVAN-HCT) 160-25 MG tablet Take 1 tablet by mouth daily. 90 tablet 1   Current Facility-Administered Medications  Medication Dose Route Frequency Provider Last Rate Last Admin   0.9 %  sodium  chloride infusion  500 mL Intravenous Once Armbruster, Carlota Raspberry, MD        Allergies:   Ace inhibitors, Atorvastatin, Benadryl [diphenhydramine], and Peppermint flavor [flavoring agent]    Social History:  The patient  reports that she has never smoked. She has never used smokeless tobacco. She reports that she does not drink alcohol and does not use drugs.   Family History:  The patient's family history includes COPD in her mother; Cancer in her father; Colon cancer in her maternal grandmother; Heart attack in her maternal grandfather; Heart failure in her mother; Hypertension in her mother; Rectal  cancer in her maternal grandmother.    ROS:   Please see the history of present illness. (+) LE edema (+) Stress (+) Thinning hair All other systems are reviewed and negative.    PHYSICAL EXAM: VS:  BP 132/66 (BP Location: Left Arm, Patient Position: Sitting, Cuff Size: Large)    Pulse 67    Ht '5\' 3"'  (1.6 m)    Wt 187 lb 12.8 oz (85.2 kg)    BMI 33.27 kg/m  , BMI Body mass index is 33.27 kg/m. GENERAL:  Well appearing HEENT: Pupils equal round and reactive, fundi not visualized, oral mucosa unremarkable NECK:  No jugular venous distention, waveform within normal limits, carotid upstroke brisk and symmetric, no bruits LUNGS:  Clear to auscultation bilaterally HEART:  RRR.  PMI not displaced or sustained,S1 and S2 within normal limits, no S3, no S4, no clicks, no rubs, II/VI systolic murmur at the LUSB ABD:  Flat, positive bowel sounds normal in frequency in pitch, no bruits, no rebound, no guarding, no midline pulsatile mass, no hepatomegaly, no splenomegaly EXT:  2 plus pulses throughout, no edema, no cyanosis no clubbing SKIN:  No rashes no nodules NEURO:  Cranial nerves II through XII grossly intact, motor grossly intact throughout PSYCH:  Cognitively intact, oriented to person place and time   EKG:   02/09/18: Sinus bradycardia.  Rate 54 bpm.  Nonspecific T wave  abnormalities. 04/05/19: Sinus bradycardia.  Rate 58 bpm.  Nonspecific T wave abnormalities 08/04/2019: EKG was not ordered. 10/12/2020: Sinus bradycardia. Rate 58 bpm, 1st degree AV block, low voltage.  06/17/2021: Sinus rhythm.  67 bpm.  PACs.  Echo 11/17/17: Study Conclusions   - Left ventricle: The cavity size was normal. Systolic function was   hyperdynamic. The estimated ejection fraction was in the range of   70% to 75%. Wall motion was normal; there were no regional wall   motion abnormalities. Features are consistent with a pseudonormal   left ventricular filling pattern, with concomitant abnormal   relaxation and increased filling pressure (grade 2 diastolic   dysfunction). - Mitral valve: Valve area by pressure half-time: 2.22 cm^2. - Left atrium: The atrium was moderately dilated. - Pulmonary arteries: PA peak pressure: 31 mm Hg (S).  Recent Labs: 10/12/2020: TSH 1.910 12/26/2020: Hemoglobin 14.0; Platelets 213 06/06/2021: ALT 21; BUN 14; Creatinine, Ser 0.89; Potassium 4.6; Sodium 145   10/15/2017: TSH 2.06, free T4 1.09 Hemoglobin A1c 6.6% Sodium 141, potassium 3.9, BUN 11, creatinine 0.83  01/21/2018: Total cholesterol 198, triglycerides 101, HDL 55, LDL 123  Lipid Panel    Component Value Date/Time   CHOL 138 06/06/2021 1421   TRIG 43 06/06/2021 1421   HDL 60 06/06/2021 1421   CHOLHDL 2.3 06/06/2021 1421   LDLCALC 68 06/06/2021 1421      Wt Readings from Last 3 Encounters:  06/17/21 187 lb 12.8 oz (85.2 kg)  06/06/21 186 lb 9.6 oz (84.6 kg)  01/22/21 175 lb 12.8 oz (79.7 kg)      ASSESSMENT AND PLAN: Essential hypertension Blood pressure has been well-controlled on amlodipine, carvedilol, valsartan and HCTZ.    Mixed hyperlipidemia Continue rosuvastatin.  She will get back onto her regular exercise routine and keep trying to work on her diet.  She appreciates working with her Engineer, maintenance.   Current medicines are reviewed at length with the patient today.   The patient does not have concerns regarding medicines.  The following changes have been made: Increase amlodipine and resume atorvastatin  Labs/  tests ordered today include:   No orders of the defined types were placed in this encounter.    Disposition:   FU with Amed Datta C. Oval Linsey, MD, [facility] in 6 months.    Signed, Mareesa Gathright C. Oval Linsey, MD, [facility]  06/17/2021 9:50 AM    [place]

## 2021-06-17 NOTE — Assessment & Plan Note (Signed)
Blood pressure has been well-controlled on amlodipine, carvedilol, valsartan and HCTZ.

## 2021-06-20 DIAGNOSIS — M5136 Other intervertebral disc degeneration, lumbar region: Secondary | ICD-10-CM | POA: Diagnosis not present

## 2021-06-20 DIAGNOSIS — M9903 Segmental and somatic dysfunction of lumbar region: Secondary | ICD-10-CM | POA: Diagnosis not present

## 2021-06-20 DIAGNOSIS — S29012A Strain of muscle and tendon of back wall of thorax, initial encounter: Secondary | ICD-10-CM | POA: Diagnosis not present

## 2021-06-20 DIAGNOSIS — M9905 Segmental and somatic dysfunction of pelvic region: Secondary | ICD-10-CM | POA: Diagnosis not present

## 2021-06-20 DIAGNOSIS — M9901 Segmental and somatic dysfunction of cervical region: Secondary | ICD-10-CM | POA: Diagnosis not present

## 2021-06-20 DIAGNOSIS — M47817 Spondylosis without myelopathy or radiculopathy, lumbosacral region: Secondary | ICD-10-CM | POA: Diagnosis not present

## 2021-06-20 DIAGNOSIS — M5032 Other cervical disc degeneration, mid-cervical region, unspecified level: Secondary | ICD-10-CM | POA: Diagnosis not present

## 2021-06-20 DIAGNOSIS — M9902 Segmental and somatic dysfunction of thoracic region: Secondary | ICD-10-CM | POA: Diagnosis not present

## 2021-06-24 ENCOUNTER — Other Ambulatory Visit: Payer: Self-pay

## 2021-06-24 ENCOUNTER — Ambulatory Visit: Payer: Medicare PPO

## 2021-06-24 DIAGNOSIS — E785 Hyperlipidemia, unspecified: Secondary | ICD-10-CM

## 2021-06-24 DIAGNOSIS — M19079 Primary osteoarthritis, unspecified ankle and foot: Secondary | ICD-10-CM

## 2021-06-24 DIAGNOSIS — E1169 Type 2 diabetes mellitus with other specified complication: Secondary | ICD-10-CM

## 2021-06-24 NOTE — Progress Notes (Signed)
SITUATION Reason for Consult: Evaluation for Prefabricated Diabetic Shoes and Bilateral Custom Diabetic Inserts. Patient / Caregiver Report: Patient would like well fitting shoes  OBJECTIVE DATA: Patient History / Diagnosis:    ICD-10-CM   1. Type 2 diabetes mellitus with hyperlipidemia (HCC)  E11.69    E78.5     2. Arthritis of midfoot  M19.079       Current or Previous Devices:   Historical shoe user  In-Person Foot Examination: Ulcers & Callousing:   None  Toe / Foot Deformities:   - Pes cavus   Shoe Size: 8.5W  ORTHOTIC RECOMMENDATION Recommended Devices: - 1x pair prefabricated PDAC approved diabetic shoes: Patient selected: Orthofeet 846 - 8.5W - 3x pair custom-to-patient vacuum formed diabetic insoles.   GOALS OF SHOES AND INSOLES - Reduce shear and pressure - Reduce / Prevent callus formation - Reduce / Prevent ulceration - Protect the fragile healing compromised diabetic foot.  Patient would benefit from diabetic shoes and inserts as patient has diabetes mellitus and the patient has one or more of the following conditions: - History of partial or complete amputation of the foot - History of previous foot ulceration. - History of pre-ulcerative callus - Peripheral neuropathy with evidence of callus formation - Foot deformity - Poor circulation  ACTIONS PERFORMED Patient was casted for insoles via crush box and measured for shoes via brannock device. Procedure was explained and patient tolerated procedure well. All questions were answered and concerns addressed.  PLAN Patient is to ensure treating physician receives and completes diabetic paperwork. Casts and shoe order are to be held until paperwork is received. Once received patient is to be scheduled for fitting in four weeks.

## 2021-07-01 ENCOUNTER — Encounter: Payer: Self-pay | Admitting: Nurse Practitioner

## 2021-07-22 ENCOUNTER — Telehealth: Payer: Self-pay

## 2021-07-22 NOTE — Telephone Encounter (Signed)
Casts Sent to [employer] - HOLD for CMN °

## 2021-07-23 DIAGNOSIS — S29012A Strain of muscle and tendon of back wall of thorax, initial encounter: Secondary | ICD-10-CM | POA: Diagnosis not present

## 2021-07-23 DIAGNOSIS — M9903 Segmental and somatic dysfunction of lumbar region: Secondary | ICD-10-CM | POA: Diagnosis not present

## 2021-07-23 DIAGNOSIS — M9905 Segmental and somatic dysfunction of pelvic region: Secondary | ICD-10-CM | POA: Diagnosis not present

## 2021-07-23 DIAGNOSIS — M5032 Other cervical disc degeneration, mid-cervical region, unspecified level: Secondary | ICD-10-CM | POA: Diagnosis not present

## 2021-07-23 DIAGNOSIS — M9901 Segmental and somatic dysfunction of cervical region: Secondary | ICD-10-CM | POA: Diagnosis not present

## 2021-07-23 DIAGNOSIS — M47817 Spondylosis without myelopathy or radiculopathy, lumbosacral region: Secondary | ICD-10-CM | POA: Diagnosis not present

## 2021-07-23 DIAGNOSIS — M9902 Segmental and somatic dysfunction of thoracic region: Secondary | ICD-10-CM | POA: Diagnosis not present

## 2021-07-23 DIAGNOSIS — M5136 Other intervertebral disc degeneration, lumbar region: Secondary | ICD-10-CM | POA: Diagnosis not present

## 2021-07-25 ENCOUNTER — Other Ambulatory Visit: Payer: Self-pay

## 2021-07-25 ENCOUNTER — Ambulatory Visit (INDEPENDENT_AMBULATORY_CARE_PROVIDER_SITE_OTHER): Payer: Medicare PPO

## 2021-07-25 DIAGNOSIS — Z0000 Encounter for general adult medical examination without abnormal findings: Secondary | ICD-10-CM

## 2021-07-25 NOTE — Progress Notes (Signed)
Appointment Outcome: Completed, Session #: [id number] ?Start time: 2:36pm   End time: 3:21pm   Total Mins: 45 minutes ? ?AGREEMENTS SECTION ? ? ?Patient's appointment was set for an in-person visit and patient had called in because she did not remember if she was going to have a virtual visit instead. Patient was called after a 5-minute waiting period to see if she was still coming in. Patient opted to have appointment over the phone rather than rescheduling. ? ?  ?Overall Goal(s): ?Stress management ?Improve healthy eating habits ?Reduce sodium consumption ?Lower A1C and LDL ? ?Agreement/Action Steps:  ?Reduce sodium consumption/lower A1C/lower LDL ?Read food labels to monitor sodium intake  ?Drink 2-8 oz cups of tea daily ?Drink 3-16 oz bottles of water daily ?Bake food instead of frying ?Set boundaries around eating out with family and friends ? ?Stress Management ?Utilize support system  ?Conduct gratitude reflections (3 things grateful for and/or 3 things was successful at doing) - ?Set an alarm for 5:30pm to start writing for 10-15 minutes  ?Conduct self-check-ins and implement positive self-talk ?Incorporate positive self-talk ?Engage in church activities ?[facility] ? ?Progress Notes:  ?Patient reported that she has been reading food labels to monitor her sodium and sugar intake. Patient shared that she recently went shopping for breakfast cereal and reading 10 boxes before deciding on Cheerios with the least amount of sodium and sugar. Patient stated that she adds half a banana or fresh strawberries to her cereal instead of sugar. Patient stated that she typically has oatmeal and apples with cinnamon (no sugar) for breakfast.  ? ?Patient mentioned that she has had some challenges with getting her fluids in daily. Patient shared that she drinks about 2.5-16.9 oz bottles of water daily in addition to at least 1-8 oz cup of green tea (approximately 50 oz of fluid).  Patient stated that she did run out of tea in the past month but has since restocked and resumed drinking 1-8 oz cup but not consistently. Patient stated that she finds trying to drink the third bottle of water is challenging because she is either full or does not find herself to be thirsty. Patient stated that she finishes the other half of the water bottle in the morning.  ? ?Patient stated that she has been able to stick to pan searing, sauteing, and bake/broil her foods instead of frying them at home. Patient shared that she has eaten fried catfish at a restaurant. Afterwards, the patient started taking off the fish skin. Since then, the patient was reminded by her son during a dinner that she was not supposed to eat fried foods and that peeling the skin off wasn't helpful. Patient has resumed eating fish at home that has not been fried and has resumed enforcing her eating boundaries while out with family and friends.  ? ?Patient stated that she has been meal planning/prepping to help her avoid eating out during the week. Patient expressed that it has been helpful to plan out her meals as well so that she can monitor her sodium and sugar intake. Patient stated that by her cooking at home more, she has been able to recognize how much money she has been able to save.  ? ?Patient shared that in the past month she lost a family member and a close friend. Patient expressed that this made her think of her late husband and mother, which she thought she was over grieving. Patient stated that grieving threw  her off her scheduled, but she conducted self-check-ins and implementing positive self-talk during this stressful time to help manage her stress. Patient stated that since then she has been able to get back on track with focusing on herself. ? ?Patient mentioned that she has been able to conduct her gratitude reflections when her alarm goes off at 5:30pm no matter who she is around or where she is. Patient stated that  this has been the most helpful action step because it allows her to reflect on the positive things in her life, while reminding herself of what's important and things she is doing for her health. Patient stated that this process has been helpful for her mentally, physically, and spiritually. Patient stated that she has also been reflecting on what she can correct/improve regarding her health behaviors.  ? ?Patient shared that she has been engaged in several church activities. Patient participates in church meetings twice a month. Patient elaborated on several projects that they are currently planning/working on. Patient stated that they hold their meetings at the [facility], which helps the patient be consistent with engaging with friends and exercising.  ? ?Patient stated that the class schedule at the [facility] has changed and the classes that she attends are not scheduled on enough days for her to make it to the center 3-4 days a week. However, patient has been focused on the number of minutes that she is engaged in exercise with the 3-4 classes that she attends. Patient shared that sometimes she has to participate in 2 classes in one day in order to reach her goal of 150 minutes for the week. Patient stated that each class is one hour long. Patient is participating in [place], and [employer] classes with aerobic chair exercises.  ? ?Patient stated that she is currently weighing in the 180s and would like to be in the 150s. Patient inquired about changing her medication for diabetes to another medication that can assist her in managing her A1C and lose weight.  ? ?Indicators of Success and Accountability:  Patient stated that reading food labels to monitor sodium and sugar intake has been her indicator of success and accountability.  ?Readiness: Patient is in the action phase of managing stress, improving healthy eating habits to reduce sodium consumption, lower A1C and LDL.  ?Strengths and  Supports: Patient is being supported by family and friends. Patient stated that she is more mindful of her behaviors and her perspective has changed towards her health behaviors.  ?Challenges and Barriers: Patient does not foresee any challenges/barriers to implementing her action steps over the next 6 months.  ? ?Coaching Outcomes: ?Patient was encouraged to speak with her PCP about changing medications to manage her diabetes while helping with weight loss.  ? ?Patient stated that her perspective and attitude has changed towards her health behaviors. Patient has realized that she must have a plan and stick to it and to be flexible with a second plan to assist in adjusting to changes in her daily schedule.  ? ?Patient will implement the following steps over the next 68-month.  ? ?Overall Goal(s): ?Stress management ?Improve healthy eating habits ?Reduce sodium consumption ?Lower A1C and LDL ? ?Agreement/Action Steps:  ?Reduce sodium consumption/lower A1C/lower LDL ?Read food labels to monitor sodium and sugar intake  ?Consume 64 oz of fluid ?Drink 1-8 oz cups of tea daily ?Drink 3-16 oz bottles of water daily ?Meal plan/prep for the week to deter eating fast-food ?Bake food instead  of frying ?Set boundaries around eating out with family and friends ? ?Stress Management: ?Utilize support system  ?Conduct gratitude reflections (3 things grateful for and/or 3 things was successful at doing) ?Set an alarm for 5:30pm to start writing for 10-15 minutes  ?Conduct self-check-ins ?Incorporate positive self-talk ?Engage in church activities ?Exercise at the [facility] 3-4 classes/week for 1 hour each class ? ?Will schedule patient's 56-monthfollow up for 9/15 at 2:30pm when appointments become available.  ? ?Attempted: ?Fulfilled - Patient has been able to maintain the following action steps over the past three months: ?Read food labels to monitor sodium intake  ?Bake food instead of frying ?Set boundaries around  eating out with family and friends ?Utilize support system  ?Conduct gratitude reflections (3 things grateful for and/or 3 things was successful at doing) - ?Set an alarm for 5:30pm to start writing for 10-

## 2021-08-09 ENCOUNTER — Telehealth: Payer: Self-pay | Admitting: Podiatry

## 2021-08-09 NOTE — Telephone Encounter (Signed)
Called Jasmine Lopez to r/s her appt with Dr Blenda Mounts and she was asking about status of her diabetic shoes. Could she get a call with an update. ? ?

## 2021-08-27 DIAGNOSIS — S29012A Strain of muscle and tendon of back wall of thorax, initial encounter: Secondary | ICD-10-CM | POA: Diagnosis not present

## 2021-08-27 DIAGNOSIS — M47817 Spondylosis without myelopathy or radiculopathy, lumbosacral region: Secondary | ICD-10-CM | POA: Diagnosis not present

## 2021-08-27 DIAGNOSIS — M9905 Segmental and somatic dysfunction of pelvic region: Secondary | ICD-10-CM | POA: Diagnosis not present

## 2021-08-27 DIAGNOSIS — M9903 Segmental and somatic dysfunction of lumbar region: Secondary | ICD-10-CM | POA: Diagnosis not present

## 2021-08-27 DIAGNOSIS — M9902 Segmental and somatic dysfunction of thoracic region: Secondary | ICD-10-CM | POA: Diagnosis not present

## 2021-08-27 DIAGNOSIS — M5032 Other cervical disc degeneration, mid-cervical region, unspecified level: Secondary | ICD-10-CM | POA: Diagnosis not present

## 2021-08-27 DIAGNOSIS — M9901 Segmental and somatic dysfunction of cervical region: Secondary | ICD-10-CM | POA: Diagnosis not present

## 2021-08-27 DIAGNOSIS — M5136 Other intervertebral disc degeneration, lumbar region: Secondary | ICD-10-CM | POA: Diagnosis not present

## 2021-08-28 DIAGNOSIS — L661 Lichen planopilaris: Secondary | ICD-10-CM | POA: Diagnosis not present

## 2021-09-09 ENCOUNTER — Encounter: Payer: Self-pay | Admitting: Nurse Practitioner

## 2021-09-11 ENCOUNTER — Telehealth: Payer: Self-pay

## 2021-09-11 ENCOUNTER — Ambulatory Visit: Payer: Medicare PPO | Admitting: Podiatry

## 2021-09-11 ENCOUNTER — Encounter: Payer: Self-pay | Admitting: Podiatry

## 2021-09-11 DIAGNOSIS — B351 Tinea unguium: Secondary | ICD-10-CM

## 2021-09-11 DIAGNOSIS — E1169 Type 2 diabetes mellitus with other specified complication: Secondary | ICD-10-CM | POA: Diagnosis not present

## 2021-09-11 DIAGNOSIS — E785 Hyperlipidemia, unspecified: Secondary | ICD-10-CM | POA: Diagnosis not present

## 2021-09-11 DIAGNOSIS — M79675 Pain in left toe(s): Secondary | ICD-10-CM

## 2021-09-11 DIAGNOSIS — M79674 Pain in right toe(s): Secondary | ICD-10-CM

## 2021-09-11 NOTE — Telephone Encounter (Signed)
Patient was in the office for an appointment with Dr. Blenda Mounts, inquiring about status of diabetic shoes. Informed patient that we had not received documents from her treating physician but have attempted to fax it six times. Patient requested the CMN paperwork herself and will hand deliver it to Dr. Baird [facility].  ? ?Patient also requests her shoe order be changed to blue. ?

## 2021-09-11 NOTE — Progress Notes (Signed)
?  Subjective:  ?Patient ID: Jasmine Lopez, female    DOB: 22-Jan-1941,   MRN: [id number] ? ?No chief complaint on file. ? ? ?81 y.o. female presents for concern of thickened elongated and painful nails that are difficult to trim. Requesting to have them trimmed today. Patient relates she also has pain in the right foot in the arch of her foot. Relates any burning or tingling in her feet. Patient is diabetic and last A1c was 6.7.  ? . Denies any other pedal complaints. Denies n/v/f/c.  ? ?Past Medical History:  ?Diagnosis Date  ? Anxiety   ? Breast cancer ([place])   ? Cancer [facility])   ? Diabetes mellitus without complication ([place])   ? Dysphagia   ? GERD (gastroesophageal reflux disease) 10/12/2020  ? Hair loss 10/12/2020  ? Heart murmur   ? Hx of adenomatous polyp of colon 06/2015  ? [name]- [facility]  ? Hypertension   ? Irregular heart beat   ? Sleep apnea   ? ? ?Objective:  ?Physical Exam: ?Vascular: DP/PT pulses 2/4 bilateral. CFT <3 seconds. Normal hair growth on digits. No edema.  ?Skin. No lacerations or abrasions bilateral feet. Nails 1-5 are thickened discolored and elongated with subungual debris.  ?Musculoskeletal: MMT 5/5 bilateral lower extremities in DF, PF, Inversion and Eversion. Deceased ROM in DF of ankle joint. Tender over the dorsal midfoot area. Pain through the medial arch around first TMTJ.  ?Neurological: Sensation intact to light touch.  ? ?Assessment:  ? ?1. Type 2 diabetes mellitus with hyperlipidemia ([place])   ?2. Pain due to onychomycosis of toenails of both feet   ? ? ? ? ?Plan:  ?Patient was evaluated and treated and all questions answered. ?-Discussed and educated patient on diabetic foot care, especially with  ?regards to the vascular, neurological and musculoskeletal systems.  ?-Stressed the importance of good glycemic control and the detriment of not  ?controlling glucose levels in relation to the foot. ?-Discussed supportive shoes at all times and checking feet regularly.   ?-Mechanically debrided all nails 1-5 bilateral using sterile nail nipper and filed with dremel without incident  ?-Answered all patient questions ?-Patient to return  in 3 months for at risk foot care ?-Patient advised to call the office if any problems or questions arise in the meantime. ? ? ?Lorenda Peck, DPM  ? ? ?

## 2021-09-16 ENCOUNTER — Ambulatory Visit: Payer: Medicare PPO | Admitting: Podiatry

## 2021-09-18 DIAGNOSIS — L918 Other hypertrophic disorders of the skin: Secondary | ICD-10-CM | POA: Diagnosis not present

## 2021-09-18 DIAGNOSIS — L821 Other seborrheic keratosis: Secondary | ICD-10-CM | POA: Diagnosis not present

## 2021-09-20 ENCOUNTER — Other Ambulatory Visit: Payer: Self-pay | Admitting: Cardiovascular Disease

## 2021-09-20 NOTE — Telephone Encounter (Signed)
Rx(s) sent to pharmacy electronically.  

## 2021-09-24 DIAGNOSIS — M9902 Segmental and somatic dysfunction of thoracic region: Secondary | ICD-10-CM | POA: Diagnosis not present

## 2021-09-24 DIAGNOSIS — M9901 Segmental and somatic dysfunction of cervical region: Secondary | ICD-10-CM | POA: Diagnosis not present

## 2021-09-24 DIAGNOSIS — S29012A Strain of muscle and tendon of back wall of thorax, initial encounter: Secondary | ICD-10-CM | POA: Diagnosis not present

## 2021-09-24 DIAGNOSIS — M9905 Segmental and somatic dysfunction of pelvic region: Secondary | ICD-10-CM | POA: Diagnosis not present

## 2021-09-24 DIAGNOSIS — M5136 Other intervertebral disc degeneration, lumbar region: Secondary | ICD-10-CM | POA: Diagnosis not present

## 2021-09-24 DIAGNOSIS — M9903 Segmental and somatic dysfunction of lumbar region: Secondary | ICD-10-CM | POA: Diagnosis not present

## 2021-09-24 DIAGNOSIS — M5032 Other cervical disc degeneration, mid-cervical region, unspecified level: Secondary | ICD-10-CM | POA: Diagnosis not present

## 2021-09-24 DIAGNOSIS — M47817 Spondylosis without myelopathy or radiculopathy, lumbosacral region: Secondary | ICD-10-CM | POA: Diagnosis not present

## 2021-10-03 ENCOUNTER — Telehealth: Payer: Self-pay

## 2021-10-03 NOTE — Telephone Encounter (Signed)
Returned patient's phonecall and left vm confirming we received her message. Shoes ordered and casts released from fabrication hold.

## 2021-10-09 ENCOUNTER — Ambulatory Visit: Payer: Medicare PPO | Admitting: Nurse Practitioner

## 2021-10-29 ENCOUNTER — Telehealth: Payer: Self-pay | Admitting: Podiatry

## 2021-10-29 NOTE — Telephone Encounter (Signed)
Called patient and lvm informing her that her inserts and diabetic shoes was available for pick up

## 2021-11-02 DIAGNOSIS — S0083XA Contusion of other part of head, initial encounter: Secondary | ICD-10-CM | POA: Diagnosis not present

## 2021-11-02 DIAGNOSIS — R55 Syncope and collapse: Secondary | ICD-10-CM | POA: Diagnosis not present

## 2021-11-04 ENCOUNTER — Telehealth: Payer: Self-pay

## 2021-11-04 NOTE — Telephone Encounter (Signed)
Patient called to make appt because she had a fall. She went to ER day of fall but she now has black eyes and she is concerned. I explained to her that [facility] did not have any available appointments but I will ask if she can be added today because she was concerned. She really wanted bloodwork and another MRI. She said she felt good the only reason she is concerned is because of the black eyes she said could wait until tomorrow.

## 2021-11-05 ENCOUNTER — Ambulatory Visit: Payer: Medicare PPO | Admitting: Nurse Practitioner

## 2021-11-05 ENCOUNTER — Encounter: Payer: Self-pay | Admitting: Nurse Practitioner

## 2021-11-05 ENCOUNTER — Telehealth: Payer: Self-pay

## 2021-11-05 VITALS — BP 118/78 | HR 61 | Temp 98.1°F | Ht 63.0 in | Wt 187.6 lb

## 2021-11-05 DIAGNOSIS — E6609 Other obesity due to excess calories: Secondary | ICD-10-CM

## 2021-11-05 DIAGNOSIS — I10 Essential (primary) hypertension: Secondary | ICD-10-CM

## 2021-11-05 DIAGNOSIS — W19XXXA Unspecified fall, initial encounter: Secondary | ICD-10-CM

## 2021-11-05 DIAGNOSIS — E782 Mixed hyperlipidemia: Secondary | ICD-10-CM | POA: Diagnosis not present

## 2021-11-05 DIAGNOSIS — Z6833 Body mass index (BMI) 33.0-33.9, adult: Secondary | ICD-10-CM

## 2021-11-05 DIAGNOSIS — Z23 Encounter for immunization: Secondary | ICD-10-CM

## 2021-11-05 DIAGNOSIS — E785 Hyperlipidemia, unspecified: Secondary | ICD-10-CM | POA: Diagnosis not present

## 2021-11-05 DIAGNOSIS — E1169 Type 2 diabetes mellitus with other specified complication: Secondary | ICD-10-CM

## 2021-11-05 DIAGNOSIS — R55 Syncope and collapse: Secondary | ICD-10-CM | POA: Diagnosis not present

## 2021-11-05 LAB — CMP14+EGFR
ALT: 16 IU/L (ref 0–32)
AST: 18 IU/L (ref 0–40)
Albumin/Globulin Ratio: 1.8 (ref 1.2–2.2)
Albumin: 4.2 g/dL (ref 3.7–4.7)
Alkaline Phosphatase: 63 IU/L (ref 44–121)
BUN/Creatinine Ratio: 17 (ref 12–28)
BUN: 14 mg/dL (ref 8–27)
Bilirubin Total: 0.2 mg/dL (ref 0.0–1.2)
CO2: 23 mmol/L (ref 20–29)
Calcium: 9.5 mg/dL (ref 8.7–10.3)
Chloride: 102 mmol/L (ref 96–106)
Creatinine, Ser: 0.81 mg/dL (ref 0.57–1.00)
Globulin, Total: 2.4 g/dL (ref 1.5–4.5)
Glucose: 145 mg/dL — ABNORMAL HIGH (ref 70–99)
Potassium: 4.4 mmol/L (ref 3.5–5.2)
Sodium: 138 mmol/L (ref 134–144)
Total Protein: 6.6 g/dL (ref 6.0–8.5)
eGFR: 74 mL/min/{1.73_m2} (ref >59)

## 2021-11-05 LAB — LIPID PANEL
Chol/HDL Ratio: 3.6 ratio (ref 0.0–4.4)
Cholesterol, Total: 191 mg/dL (ref 100–199)
HDL: 53 mg/dL (ref >39)
LDL Chol Calc (NIH): 125 mg/dL — ABNORMAL HIGH (ref 0–99)
Triglycerides: 70 mg/dL (ref 0–149)
VLDL Cholesterol Cal: 13 mg/dL (ref 5–40)

## 2021-11-05 LAB — HEMOGLOBIN A1C
Est. average glucose Bld gHb Est-mCnc: 169 mg/dL
Hgb A1c MFr Bld: 7.5 % — ABNORMAL HIGH (ref 4.8–5.6)

## 2021-11-05 LAB — CBC
Hematocrit: 37.7 % (ref 34.0–46.6)
Hemoglobin: 13.4 g/dL (ref 11.1–15.9)
MCH: 30.9 pg (ref 26.6–33.0)
MCHC: 35.5 g/dL (ref 31.5–35.7)
MCV: 87 fL (ref 79–97)
Platelets: 217 10*3/uL (ref 150–450)
RBC: 4.33 x10E6/uL (ref 3.77–5.28)
RDW: 12.3 % (ref 11.7–15.4)
WBC: 4.4 10*3/uL (ref 3.4–10.8)

## 2021-11-05 NOTE — Progress Notes (Signed)
PATIENT: Jasmine Lopez DOB: 1940/07/05  REASON FOR VISIT: follow up HISTORY FROM: patient PRIMARY NEUROLOGIST: Dr.Yan  HISTORY  Jasmine Lopez is a 81 year old female, seen in request by his primary care physician Dr.Hanna, Jonelle Sidle for evaluation of mild cognitive impairment, he is accompanied by his son at today's clinical visit on May 17, 2019.   I have reviewed and summarized the referring note from the referring physician.  She has past medical history of hypertension, hyperlipidemia, diabetes, left breast calcified mass surgery,   She retired as a Management consultant at age 37, she went through excessive stress in early 2019, her husband, and her mother passed away few months apart, she moved from [place] to [place] to be close to her son, she lives independently, still driving, tends to rely on her GPS, she has good appetite, has trouble sleeping, tends to be sedentary,   She was noted to have gradual onset mild cognitive malfunction around 2016, she has word finding difficulties, sometimes difficulty to carry on her train of thoughts, only has mild memory difficulty noted.  There was no family history of memory loss   Laboratory evaluations in September 2020 showed normal CBC, CMP showed elevated glucose 136, LDL 132, normal thyroid functional test, A1c was 6.9  Update November 01, 2019 SS: Here today for follow-up unaccompanied.  Indicates she is doing much better.  There was a time when she took herself off Wellbutrin, since being back on, feels her focus is better, processing better.  She had significant stress in 2019 with loss of her husband and mother very close together, is now healing from this, is more confident in her decision making.  Her overall health has been good.  [facility], she loves it, plans to continue independently.  Doing well living on her own, is actually managing  buying her mothers home, this is a big step for her and process to manage.  Does her own ADLs, drives a car, uses GPS.  No family history of memory troubles, her mother was 66, memory was very sharp.  MMSE 26/30. She reads, does puzzles often.  MRI of the brain was unremarkable, B12 was normal.  Update November 06, 2020 SS: MMSE 26/30 today. Here alone. Thinks memory is getting better, is being more aware of her thought process, making herself think through things. Has worked through a lot of family stressors. She got a car, living in her mom's house. [facility], [facility], health and and wellness. Is back on Wellbutrin. Not on any memory medications. Walking a few days a week, with friends. She is doing well.  Update November 06, 2021 SS: Here today alone, her son is on the phone with [place]. She went down to her home in [place], 5 days ago was in the home organizing, it was hot inside, had syncopal spell, remembers smelling something "sweet", was going through bag of old medications that were 18 years ago (gritty medication on some paper), son wonders if syncope could have been due to gas exposure. Remembers feeling lightheaded, wonders if she is going to pass out? She woke up in the floor, applied ice, drove home. Out for 2 minutes. She didn't go to the ER. Today MMSE 29/30. Does feel woozy, out of it. Decline over last 6 months, repeats herself. Has 2 black eyes.   REVIEW OF SYSTEMS: Out of a complete 14 system  review of symptoms, the patient complains only of the following symptoms, and all other reviewed systems are negative.  See HPI  ALLERGIES: Allergies  Allergen Reactions   Ace Inhibitors Cough   Atorvastatin Other (See Comments)    Leg pain    Benadryl [Diphenhydramine] Itching    Benadryl cream   Peppermint Flavor [Flavoring Agent]     Causes horsenss    HOME MEDICATIONS: Outpatient Medications Prior to Visit  Medication Sig Dispense Refill   Accu-Chek Softclix Lancets lancets  Use to check blood sugars twice daily E11.69 100 each 2   acetaminophen (TYLENOL) 500 MG tablet Take 500 mg by mouth every 6 (six) hours as needed.     allopurinol (ZYLOPRIM) 100 MG tablet Take 1 tablet (100 mg total) by mouth daily. 90 tablet 0   amLODipine (NORVASC) 10 MG tablet TAKE 1 TABLET(10 MG) BY MOUTH DAILY 90 tablet 2   Biotin 1000 MCG tablet biotin     blood glucose meter kit and supplies KIT Dispense based on patient and insurance preference. Use up to two times daily as directed. (FOR ICD-9 250.00, 250.01). 1 each 0   buPROPion (WELLBUTRIN XL) 150 MG 24 hr tablet TAKE 1 TABLET(150 MG) BY MOUTH EVERY MORNING 90 tablet 1   carvedilol (COREG) 6.25 MG tablet Take 1 tablet (6.25 mg total) by mouth 2 (two) times daily with a meal. 180 tablet 1   Cholecalciferol (VITAMIN D3) 125 MCG (5000 UT) CAPS Take 1 capsule by mouth daily at 12 noon.     clobetasol (TEMOVATE) 0.05 % external solution SMARTSIG:1 Sparingly Topical Twice Daily     Continuous Blood Gluc Receiver (FREESTYLE LIBRE 14 DAY READER) DEVI Use as directed to check blood sugars 2 times per day dx: e11.9 1 each 1   Continuous Blood Gluc Sensor (FREESTYLE LIBRE 14 DAY SENSOR) MISC Use as directed to check blood sugars 2 times per day dx: Z61.0 2 each 1   Garlic 9604 MG CAPS Take 1 capsule by mouth daily.     glucose blood (ACCU-CHEK SMARTVIEW) test strip Use to check blood sugars twice daily E11.69 100 each 2   metFORMIN (GLUCOPHAGE-XR) 500 MG 24 hr tablet Take 1 tablet (500 mg total) by mouth 2 (two) times daily. 180 tablet 1   Multiple Vitamin (MULTIVITAMIN) capsule Take 1 capsule by mouth daily.     valsartan-hydrochlorothiazide (DIOVAN-HCT) 160-25 MG tablet Take 1 tablet by mouth daily. 90 tablet 1   rosuvastatin (CRESTOR) 20 MG tablet TAKE 1 TABLET(20 MG) BY MOUTH DAILY (Patient not taking: Reported on 11/06/2021) 90 tablet 3   Facility-Administered Medications Prior to Visit  Medication Dose Route Frequency Provider Last Rate  Last Admin   0.9 %  sodium chloride infusion  500 mL Intravenous Once Armbruster, Carlota Raspberry, MD        PAST MEDICAL HISTORY: Past Medical History:  Diagnosis Date   Anxiety    Breast cancer ([place])    Cancer ([place])    Diabetes mellitus without complication ([place])    Dysphagia    GERD (gastroesophageal reflux disease) 10/12/2020   Hair loss 10/12/2020   Heart murmur    Hx of adenomatous polyp of colon 06/2015   [name]- [facility]   Hypertension    Irregular heart beat    Sleep apnea     PAST SURGICAL HISTORY: Past Surgical History:  Procedure Laterality Date   BREAST EXCISIONAL BIOPSY Bilateral    BREAST LUMPECTOMY Left 2016    FAMILY HISTORY:  Family History  Problem Relation Age of Onset   Heart failure Mother        CAD   COPD Mother    Hypertension Mother    Cancer Father    Rectal cancer Maternal Grandmother        Unsure   Colon cancer Maternal Grandmother    Heart attack Maternal Grandfather    Esophageal cancer Neg Hx    Stomach cancer Neg Hx     SOCIAL HISTORY: Social History   Socioeconomic History   Marital status: Widowed    Spouse name: Not on file   Number of children: Not on file   Years of education: Not on file   Highest education level: Not on file  Occupational History   Occupation: retired  Tobacco Use   Smoking status: Never   Smokeless tobacco: Never  Vaping Use   Vaping Use: Never used  Substance and Sexual Activity   Alcohol use: Never   Drug use: Never   Sexual activity: Not Currently  Other Topics Concern   Not on file  Social History Narrative   Lives home alone.  Widowed.  Retired.  MS counseling education. 4 cups coffee/ wk.     Social Determinants of Health   Financial Resource Strain: Low Risk  (10/24/2020)   Overall Financial Resource Strain (CARDIA)    Difficulty of Paying Living Expenses: Not hard at all  Food Insecurity: No Food Insecurity (10/24/2020)   Hunger Vital Sign    Worried About Running Out of  Food in the Last Year: Never true    Ran Out of Food in the Last Year: Never true  Transportation Needs: No Transportation Needs (10/24/2020)   PRAPARE - Hydrologist (Medical): No    Lack of Transportation (Non-Medical): No  Physical Activity: Insufficiently Active (10/24/2020)   Exercise Vital Sign    Days of Exercise per Week: 2 days    Minutes of Exercise per Session: 60 min  Stress: No Stress Concern Present (10/24/2020)   [place]    Feeling of Stress : Not at all  Social Connections: Not on file  Intimate Partner Violence: Not At Risk (10/14/2018)   Humiliation, Afraid, Rape, and Kick questionnaire    Fear of Current or Ex-Partner: No    Emotionally Abused: No    Physically Abused: No    Sexually Abused: No   PHYSICAL EXAM  Vitals:   11/06/21 1354  BP: 139/66  Pulse: 65  Weight: 188 lb (85.3 kg)  Height: '5\' 3"'  (1.6 m)   Body mass index is 33.3 kg/m.  Generalized: Well developed, in no acute distress, 2 black eyes    11/06/2021    2:00 PM 11/06/2020    2:12 PM 11/01/2019    7:38 AM  MMSE - Mini Mental State Exam  Orientation to time '5 5 5  ' Orientation to Place '5 5 5  ' Registration '3 3 3  ' Attention/ Calculation '4 2 2  ' Recall '3 3 2  ' Language- name 2 objects '2 2 2  ' Language- repeat '1 1 1  ' Language- follow 3 step command '3 2 3  ' Language- read & follow direction '1 1 1  ' Write a sentence '1 1 1  ' Copy design '1 1 1  ' Copy design-comments   10 animals  Total score '29 26 26   ' Neurological examination  Mentation: Alert oriented to time, place, history taking. Follows all commands speech and  language fluent Cranial nerve II-XII: Pupils were equal round reactive to light. Extraocular movements were full, visual field were full on confrontational test. Facial sensation and strength were normal.  Head turning and shoulder shrug  were normal and symmetric. Motor: The motor testing  reveals 5 over 5 strength of all 4 extremities. Good symmetric motor tone is noted throughout.  Sensory: Sensory testing is intact to soft touch on all 4 extremities. No evidence of extinction is noted.  Coordination: Cerebellar testing reveals good finger-nose-finger and heel-to-shin bilaterally.  Gait and station: Gait is normal.  Reflexes: Deep tendon reflexes are symmetric and normal bilaterally.   DIAGNOSTIC DATA (LABS, IMAGING, TESTING) - I reviewed patient records, labs, notes, testing and imaging myself where available.  Lab Results  Component Value Date   WBC 4.4 11/05/2021   HGB 13.4 11/05/2021   HCT 37.7 11/05/2021   MCV 87 11/05/2021   PLT 217 11/05/2021      Component Value Date/Time   NA 138 11/05/2021 1005   K 4.4 11/05/2021 1005   CL 102 11/05/2021 1005   CO2 23 11/05/2021 1005   GLUCOSE 145 (H) 11/05/2021 1005   BUN 14 11/05/2021 1005   CREATININE 0.81 11/05/2021 1005   CALCIUM 9.5 11/05/2021 1005   PROT 6.6 11/05/2021 1005   ALBUMIN 4.2 11/05/2021 1005   AST 18 11/05/2021 1005   ALT 16 11/05/2021 1005   ALKPHOS 63 11/05/2021 1005   BILITOT 0.2 11/05/2021 1005   GFRNONAA 63 06/25/2020 0934   GFRAA 72 06/25/2020 0934   Lab Results  Component Value Date   CHOL 191 11/05/2021   HDL 53 11/05/2021   LDLCALC 125 (H) 11/05/2021   TRIG 70 11/05/2021   CHOLHDL 3.6 11/05/2021   Lab Results  Component Value Date   HGBA1C 7.5 (H) 11/05/2021   Lab Results  Component Value Date   VITAMINB12 670 05/17/2019   Lab Results  Component Value Date   TSH 1.910 10/12/2020   ASSESSMENT AND PLAN 81 y.o. year old female  has a past medical history of Anxiety, Breast cancer ([place]), Cancer ([place]), Diabetes mellitus without complication ([place]), Dysphagia, GERD (gastroesophageal reflux disease) (10/12/2020), Hair loss (10/12/2020), Heart murmur, adenomatous polyp of colon (06/2015), Hypertension, Irregular heart beat, and Sleep apnea. here with:  1.  Mild cognitive  impairment 2.  Syncope  -I will reorder CT head, try to get in sooner for imaging  -Check EEG, given recent syncope, reported "sweet smell" before hand -MMSE 29/30 today, neuro exam is reassuring -Report of decline in memory over the last several months -Thus far, unclear etiology of memory trouble, stress/mood disorder versus central nervous system degenerative disorder -MRI of the brain was unremarkable, mild age-appropriate changes of chronic small vessel disease and supratentorial cortical atrophy -Encouraged to reach out to cardiology about syncopal spell, return back in 4 months with Dr. Krista Blue   Orders Placed This Encounter  Procedures   [place] (5MM)   EEG adult   Butler Denmark, AGNP-C, DNP 11/06/2021, 2:14 PM [facility] [address] [place], [place] [address] [phone]

## 2021-11-06 ENCOUNTER — Ambulatory Visit: Payer: Medicare PPO | Admitting: Neurology

## 2021-11-06 ENCOUNTER — Encounter: Payer: Self-pay | Admitting: Neurology

## 2021-11-06 ENCOUNTER — Telehealth: Payer: Self-pay | Admitting: Cardiovascular Disease

## 2021-11-06 VITALS — BP 139/66 | HR 65 | Ht 63.0 in | Wt 188.0 lb

## 2021-11-06 DIAGNOSIS — R413 Other amnesia: Secondary | ICD-10-CM | POA: Diagnosis not present

## 2021-11-06 DIAGNOSIS — R55 Syncope and collapse: Secondary | ICD-10-CM

## 2021-11-06 NOTE — Telephone Encounter (Signed)
   Pt c/o Syncope: STAT if syncope occurred within 30 minutes and pt complains of lightheadedness High Priority if episode of passing out, completely, today or in last 24 hours   Did you pass out today? No   When is the last time you passed out? 11/01/21 at 4 pm  Has this occurred multiple times? No   Did you have any symptoms prior to passing out? Smell something citrus fragrance before passing out   Pt had a bad fall last Friday 11/01/21 it was so bad she ended up with 2 black eye. She went to  [facility] urgent care then saw her pcp and she was advised to call Dr. Oval Linsey as well. She made an appt 07/05 with Laurann Montana

## 2021-11-06 NOTE — Patient Instructions (Signed)
I will work on getting your CT head moved up before 7/11 Check EEG Follow-up with your cardiologist about your syncopal episode  See you back in 4 months

## 2021-11-07 ENCOUNTER — Ambulatory Visit
Admission: RE | Admit: 2021-11-07 | Discharge: 2021-11-07 | Disposition: A | Discharge status: Home / Self Care | Payer: Medicare PPO | Source: Ambulatory Visit | Attending: Neurology | Admitting: Neurology

## 2021-11-07 DIAGNOSIS — R55 Syncope and collapse: Secondary | ICD-10-CM

## 2021-11-07 DIAGNOSIS — S0990XA Unspecified injury of head, initial encounter: Secondary | ICD-10-CM | POA: Diagnosis not present

## 2021-11-13 ENCOUNTER — Ambulatory Visit (INDEPENDENT_AMBULATORY_CARE_PROVIDER_SITE_OTHER): Payer: Medicare PPO

## 2021-11-13 ENCOUNTER — Encounter (HOSPITAL_BASED_OUTPATIENT_CLINIC_OR_DEPARTMENT_OTHER): Payer: Self-pay | Admitting: Family

## 2021-11-13 ENCOUNTER — Telehealth: Payer: Self-pay | Admitting: Neurology

## 2021-11-13 ENCOUNTER — Encounter (HOSPITAL_BASED_OUTPATIENT_CLINIC_OR_DEPARTMENT_OTHER): Payer: Self-pay

## 2021-11-13 ENCOUNTER — Ambulatory Visit (HOSPITAL_BASED_OUTPATIENT_CLINIC_OR_DEPARTMENT_OTHER): Payer: Medicare PPO | Admitting: Family

## 2021-11-13 VITALS — BP 149/66 | HR 58 | Ht 63.0 in | Wt 188.6 lb

## 2021-11-13 DIAGNOSIS — E1169 Type 2 diabetes mellitus with other specified complication: Secondary | ICD-10-CM

## 2021-11-13 DIAGNOSIS — I10 Essential (primary) hypertension: Secondary | ICD-10-CM | POA: Diagnosis not present

## 2021-11-13 DIAGNOSIS — E785 Hyperlipidemia, unspecified: Secondary | ICD-10-CM

## 2021-11-13 DIAGNOSIS — R55 Syncope and collapse: Secondary | ICD-10-CM | POA: Diagnosis not present

## 2021-11-13 DIAGNOSIS — E782 Mixed hyperlipidemia: Secondary | ICD-10-CM | POA: Diagnosis not present

## 2021-11-13 MED ORDER — ROSUVASTATIN CALCIUM 20 MG PO TABS: ORAL_TABLET | ORAL | 3 refills | Status: DC

## 2021-11-13 MED ORDER — ROSUVASTATIN CALCIUM 10 MG PO TABS: ORAL_TABLET | ORAL | 3 refills | Status: DC

## 2021-11-13 NOTE — Telephone Encounter (Signed)
Please call the patient, CT head did not show any acute findings post fall.  There is mild generalized atrophy, mild chronic microvascular change, all typical for age.  She is pending EEG.  IMPRESSION: This CT scan of the head without contrast shows the following: 1.   Mild generalized cortical atrophy, mostly frontal lobe.  This is likely typical for age 81.   Few foci of hypoattenuation noted in the hemispheres consistent with mild chronic microvascular ischemic change, typical for age 75.   No acute findings.

## 2021-11-13 NOTE — Patient Instructions (Signed)
Medication Instructions:  Your physician has recommended you make the following change in your medication:   Start: Crestor '10mg'$  daily   *If you need a refill on your cardiac medications before your next appointment, please call your pharmacy*   Lab Work: Please return for Lab work In 2 months for Fasting Lipid Panel and CMET. You may come to the...   [facility] (3rd floor) [address] [place], [place] [address]  Open: 8am-Noon and 1pm-4:30pm  Please ring the doorbell on the small table when you exit the elevator and the Lab Tech will come get you  [place] [facility] [address] [place], [place], [place] [address] Open: 8am-1pm, then 2pm-4:30pm   [place]- Please see attached locations sheet stapled to your lab work with address and hours.   If you have labs (blood work) drawn today and your tests are completely normal, you will receive your results only by: [place] (if you have MyChart) OR A paper copy in the mail If you have any lab test that is abnormal or we need to change your treatment, we will call you to review the results.   Testing/Procedures: Your physician has requested that you have an echocardiogram. Echocardiography is a painless test that uses sound waves to create images of your heart. It provides your doctor with information about the size and shape of your heart and how well your heart's chambers and valves are working. This procedure takes approximately one hour. There are no restrictions for this procedure. [place] has requested that you have a carotid duplex. This test is an ultrasound of the carotid arteries in your neck. It looks at blood flow through these arteries that supply the brain with blood. Allow one hour for this exam. There are no restrictions or special instructions.  Your physician has recommended that you wear a Zio monitor.   This monitor  is a medical device that records the heart's electrical activity. Doctors most often use these monitors to diagnose arrhythmias. Arrhythmias are problems with the speed or rhythm of the heartbeat. The monitor is a small device applied to your chest. You can wear one while you do your normal daily activities. While wearing this monitor if you have any symptoms to push the button and record what you felt. Once you have worn this monitor for the period of time provider prescribed (Usually 14 days), you will return the monitor device in the postage paid box. Once it is returned they will download the data collected and provide [place] with a report which the provider will then review and we will call you with those results. Important tips:  Avoid showering during the first 24 hours of wearing the monitor. Avoid excessive sweating to help maximize wear time. Do not submerge the device, no hot tubs, and no swimming pools. Keep any lotions or oils away from the patch. After 24 hours you may shower with the patch on. Take brief showers with your back facing the shower head.  Do not remove patch once it has been placed because that will interrupt data and decrease adhesive wear time. Push the button when you have any symptoms and write down what you were feeling. Once you have completed wearing your monitor, remove and place into box which has postage paid and place in your outgoing mailbox.  If for some reason you have misplaced your box then call our office and we can  provide another box and/or mail it off for you.    Follow-Up: At [facility], you and your health needs are our priority.  As part of our continuing mission to provide you with exceptional heart care, we have created designated Provider Care Teams.  These Care Teams include your primary Cardiologist (physician) and Advanced Practice Providers (APPs -  Physician Assistants and Nurse Practitioners) who all work together to provide you with the care  you need, when you need it.  We recommend signing up for the patient portal called "MyChart".  Sign up information is provided on this After Visit Summary.  MyChart is used to connect with patients for Virtual Visits (Telemedicine).  Patients are able to view lab/test results, encounter notes, upcoming appointments, etc.  Non-urgent messages can be sent to your provider as well.   To learn more about what you can do with MyChart, go to [email or web address].    Your next appointment:   2 month(s)  The format for your next appointment:   In Person  Provider:   Skeet Latch, MD or Laurann Montana, NP{  Other Instructions Heart Healthy Diet Recommendations: A low-salt diet is recommended. Meats should be grilled, baked, or boiled. Avoid fried foods. Focus on lean protein sources like fish or chicken with vegetables and fruits. The American Heart Association is a Microbiologist!  American Heart Association Diet and Lifeystyle Recommendations   Exercise recommendations: The American Heart Association recommends 150 minutes of moderate intensity exercise weekly. Try 30 minutes of moderate intensity exercise 4-5 times per week. This could include walking, jogging, or swimming.   Important Information About Sugar

## 2021-11-13 NOTE — Telephone Encounter (Signed)
I left a voicemail for the patient (ok per DPR). I informed her of the results and provided a call back number for any further questions or concerns.

## 2021-11-13 NOTE — Progress Notes (Signed)
Office Visit    Patient Name: Jasmine Lopez Date of Encounter: 11/13/2021  PCP:  [name], [facility]  Cardiologist:  Skeet Latch, MD  Advanced Practice Provider:  No care team member to display Electrophysiologist:  None      Chief Complaint    Jasmine Lopez is a 81 y.o. female presents today for syncope  Past Medical History    Past Medical History:  Diagnosis Date   Anxiety    Breast cancer ([place])    Cancer ([place])    Diabetes mellitus without complication ([place])    Dysphagia    GERD (gastroesophageal reflux disease) 10/12/2020   Hair loss 10/12/2020   Heart murmur    Hx of adenomatous polyp of colon 06/2015   [name]- [facility]   Hypertension    Irregular heart beat    Sleep apnea    Past Surgical History:  Procedure Laterality Date   BREAST EXCISIONAL BIOPSY Bilateral    BREAST LUMPECTOMY Left 2016    Allergies  Allergies  Allergen Reactions   Ace Inhibitors Cough   Atorvastatin Other (See Comments)    Leg pain    Benadryl [Diphenhydramine] Itching    Benadryl cream   Peppermint Flavor [Flavoring Agent]     Causes horsenss    History of Present Illness    Jasmine Lopez is a 81 y.o. female with a hx of hypertension, breast cancer, diabetes, syncope last seen 06/17/21 by Dr. Oval Linsey.  Previously lived in [place] but moved to [place] after the loss of her husband 02/2017 and her mother 06/2017.  Prior cardiac work-up and trial includes stress test around 2017 negative for ischemia.  Echocardiogram 11/17/2017 EF 65 to 70%, no LVH or intracavitary gradient.  No valvular heart disease.  Multiple changes have been made to try to manage her blood pressure.  She has been started on amlodipine and uptitrated to 10 mg..  Chlorthalidone discontinued as it was too difficult for her to split in half.  Metoprolol transition to carvedilol due to bradycardia.  Losartan transition to valsartan.  Syncopal  event 11/01/2021 I will organizing her home in [place].  Evaluated by neurology 11/06/2021 and recommended for CT head, EEG as she reported "sweet smell "before syncopal event.  CT head 11/08/2027 with mild generalized cortical atrophy which is likely typical for age, few foci of hypoattenuation in hemispheres consistent with mild chronic microvascular ischemic change typical for age, in no acute findings.  She presents today for follow-up with her son. Notes she self discontinued her crestor due to alopecia. Has been started on medicine by her dematologist for alopecia but was not told it was related by dermatologist to her Crestor. Syncope was an isolated event on 11/01/21 in setting of organizing her previous home in [place]. Had country ham biscuit that morning and coffee - likely dehydrated. No recurrent lightheadedness, dizziness, near syncope, syncope. No chest pain. Does note occasional sensation of needing to take a deep breath.   EKGs/Labs/Other Studies Reviewed:   The following studies were reviewed today:  Echo 11/17/17: Study Conclusions   - Left ventricle: The cavity size was normal. Systolic function was   hyperdynamic. The estimated ejection fraction was in the range of   70% to 75%. Wall motion was normal; there were no regional wall   motion abnormalities. Features are consistent with a pseudonormal   left ventricular filling pattern, with concomitant abnormal   relaxation and increased filling  pressure (grade 2 diastolic   dysfunction). - Mitral valve: Valve area by pressure half-time: 2.22 cm^2. - Left atrium: The atrium was moderately dilated. - Pulmonary arteries: PA peak pressure: 31 mm Hg (S).   EKG: EKG ordered today. EKG performed today SB 58 bpm with 1st degree AV block PR 210 and no acute ST/T wave changes.   Recent Labs: 11/05/2021: ALT 16; BUN 14; Creatinine, Ser 0.81; Hemoglobin 13.4; Platelets 217; Potassium 4.4; Sodium 138  Recent Lipid Panel    Component Value  Date/Time   CHOL 191 11/05/2021 1005   TRIG 70 11/05/2021 1005   HDL 53 11/05/2021 1005   CHOLHDL 3.6 11/05/2021 1005   LDLCALC 125 (H) 11/05/2021 1005    Home Medications   Current Meds  Medication Sig   Accu-Chek Softclix Lancets lancets Use to check blood sugars twice daily E11.69   acetaminophen (TYLENOL) 500 MG tablet Take 500 mg by mouth every 6 (six) hours as needed.   allopurinol (ZYLOPRIM) 100 MG tablet Take 1 tablet (100 mg total) by mouth daily.   amLODipine (NORVASC) 10 MG tablet TAKE 1 TABLET(10 MG) BY MOUTH DAILY   Biotin 1000 MCG tablet biotin   blood glucose meter kit and supplies KIT Dispense based on patient and insurance preference. Use up to two times daily as directed. (FOR ICD-9 250.00, 250.01).   buPROPion (WELLBUTRIN XL) 150 MG 24 hr tablet TAKE 1 TABLET(150 MG) BY MOUTH EVERY MORNING   carvedilol (COREG) 6.25 MG tablet Take 1 tablet (6.25 mg total) by mouth 2 (two) times daily with a meal.   Cholecalciferol (VITAMIN D3) 125 MCG (5000 UT) CAPS Take 1 capsule by mouth daily at 12 noon.   clobetasol (TEMOVATE) 0.05 % external solution SMARTSIG:1 Sparingly Topical Twice Daily   Continuous Blood Gluc Receiver (FREESTYLE LIBRE 14 DAY READER) DEVI Use as directed to check blood sugars 2 times per day dx: e11.9   Continuous Blood Gluc Sensor (FREESTYLE LIBRE 14 DAY SENSOR) MISC Use as directed to check blood sugars 2 times per day dx: J33.5   Garlic 4562 MG CAPS Take 1 capsule by mouth daily.   glucose blood (ACCU-CHEK SMARTVIEW) test strip Use to check blood sugars twice daily E11.69   metFORMIN (GLUCOPHAGE-XR) 500 MG 24 hr tablet Take 1 tablet (500 mg total) by mouth 2 (two) times daily.   Multiple Vitamin (MULTIVITAMIN) capsule Take 1 capsule by mouth daily.   spironolactone (ALDACTONE) 50 MG tablet Take 50 mg by mouth daily.   valsartan-hydrochlorothiazide (DIOVAN-HCT) 160-25 MG tablet Take 1 tablet by mouth daily.   Current Facility-Administered Medications for  the 11/13/21 encounter (Office Visit) with Loel Dubonnet, NP  Medication   0.9 %  sodium chloride infusion     Review of Systems      All other systems reviewed and are otherwise negative except as noted above.  Physical Exam    VS:  BP (!) 149/66 (BP Location: Left Arm, Patient Position: Sitting, Cuff Size: Large)   Pulse (!) 58   Ht '5\' 3"'  (1.6 m)   Wt 188 lb 9.6 oz (85.5 kg)   BMI 33.41 kg/m  , BMI Body mass index is 33.41 kg/m.  Wt Readings from Last 3 Encounters:  11/13/21 188 lb 9.6 oz (85.5 kg)  11/06/21 188 lb (85.3 kg)  11/05/21 187 lb 9.6 oz (85.1 kg)    GEN: Well nourished, overweight,  well developed, in no acute distress. HEENT: normal. Neck: Supple, no JVD, carotid bruits, or masses.  Cardiac: RRR, no murmurs, rubs, or gallops. No clubbing, cyanosis, edema.  Radials/PT 2+ and equal bilaterally.  Respiratory:  Respirations regular and unlabored, clear to auscultation bilaterally. GI: Soft, nontender, nondistended. MS: No deformity or atrophy. Skin: Warm and dry, no rash. Neuro:  Strength and sensation are intact. Psych: Normal affect.  Assessment & Plan    Syncope - Isolated episode workin in warm house [place] up her home in [place]. CT head by neurology unremarkable. EEG upcoming with neurology. 14 day ZIO placed in clinic to rule out arrhythmia. Echo and carotid duplex to rule out carotid stenosis or valvular abnormality as contributory. Suspect episode was orthostatic in nature as she was dehydrated that morning. Orthostatic vitals overall unremarkable today.   Orthostatic VS for the past 24 hrs (Last 3 readings):  BP- Lying Pulse- Lying BP- Sitting Pulse- Sitting BP- Standing at 0 minutes Pulse- Standing at 0 minutes BP- Standing at 3 minutes Pulse- Standing at 3 minutes  11/13/21 1024 132/78 55 120/80 62 122/81 64 124/81 65    Hypertension -  Continue current antihypertensive medication. BP at home checked intermittently 120s. Discussed to monitor BP at home  at least 2 hours after medications and sitting for 5-10 minutes.   Hyperlipidemia -  11/05/21 LDL 125. Had self dsicontinued her Crestor 48m due to concern for alopecia. Follows with dermatology. She was agreeable to resume Crestor at 129mdaily dose. FLP/CMP in 2 mos.   DM2 - Continue to follow with PCP.   Disposition: Follow up in 2 month(s) with TiSkeet LatchMD or APP.  Signed, CaLoel DubonnetNP 11/13/2021, 10:38 AM [place]

## 2021-11-14 NOTE — Progress Notes (Signed)
[facility] Note  11/28/2021     CHIEF COMPLAINT Patient presents for Diabetic Eye Exam    HISTORY OF PRESENT ILLNESS: Jasmine Lopez is a 81 y.o. female who presents to the clinic today for:   HPI     Diabetic Eye Exam   Vision is blurred for distance and fluctuates with blood sugars.  Diabetes characteristics include Type 2 and taking oral medications.  This started 7 years ago.  Blood sugar level fluctuates.  Last Blood Glucose 140.  Last A1C 7.4.  I, the attending physician,  performed the HPI with the patient and updated documentation appropriately.        Comments   Patient feels that at times she has trouble seeing at a distance.      Last edited by Bernarda Caffey, MD on 11/28/2021 10:06 AM.    Pt still follows with Dr. Katy Fitch every year, she states she has an appt with them in a couple weeks, her last A1c was 7.5 on 06.27.23   Referring physician: [name], [facility] [place],  [place] [address]  HISTORICAL INFORMATION:   Selected notes from the [facility] Referred by Dr. Baird Cancer for concern of diabetic retinopathy LEE:  Ocular Hx- PMH-   CURRENT MEDICATIONS: No current outpatient medications on file. (Ophthalmic Drugs)   No current facility-administered medications for this visit. (Ophthalmic Drugs)   Current Outpatient Medications (Other)  Medication Sig   Accu-Chek Softclix Lancets lancets Use to check blood sugars twice daily E11.69   acetaminophen (TYLENOL) 500 MG tablet Take 500 mg by mouth every 6 (six) hours as needed.   allopurinol (ZYLOPRIM) 100 MG tablet Take 1 tablet (100 mg total) by mouth daily.   amLODipine (NORVASC) 10 MG tablet TAKE 1 TABLET(10 MG) BY MOUTH DAILY   Biotin 1000 MCG tablet biotin   blood glucose meter kit and supplies KIT Dispense based on patient and insurance preference. Use up to two times daily as directed. (FOR ICD-9 250.00, 250.01).   buPROPion (WELLBUTRIN  XL) 150 MG 24 hr tablet TAKE 1 TABLET(150 MG) BY MOUTH EVERY MORNING   carvedilol (COREG) 6.25 MG tablet Take 1 tablet (6.25 mg total) by mouth 2 (two) times daily with a meal.   Cholecalciferol (VITAMIN D3) 125 MCG (5000 UT) CAPS Take 1 capsule by mouth daily at 12 noon.   clobetasol (TEMOVATE) 0.05 % external solution SMARTSIG:1 Sparingly Topical Twice Daily   Continuous Blood Gluc Receiver (FREESTYLE LIBRE 14 DAY READER) DEVI Use as directed to check blood sugars 2 times per day dx: e11.9   Continuous Blood Gluc Sensor (FREESTYLE LIBRE 14 DAY SENSOR) MISC Use as directed to check blood sugars 2 times per day dx: H54.5   Garlic 6256 MG CAPS Take 1 capsule by mouth daily.   glucose blood (ACCU-CHEK SMARTVIEW) test strip Use to check blood sugars twice daily E11.69   metFORMIN (GLUCOPHAGE-XR) 500 MG 24 hr tablet Take 1 tablet (500 mg total) by mouth 2 (two) times daily.   Multiple Vitamin (MULTIVITAMIN) capsule Take 1 capsule by mouth daily.   rosuvastatin (CRESTOR) 10 MG tablet TAKE 1 TABLET(20 MG) BY MOUTH DAILY   spironolactone (ALDACTONE) 50 MG tablet Take 50 mg by mouth daily.   valsartan-hydrochlorothiazide (DIOVAN-HCT) 160-25 MG tablet Take 1 tablet by mouth daily.   Current Facility-Administered Medications (Other)  Medication Route   0.9 %  sodium chloride infusion Intravenous   REVIEW OF SYSTEMS: ROS  Negative for: Constitutional, Gastrointestinal, Neurological, Skin, Genitourinary, Musculoskeletal, HENT, Endocrine, Cardiovascular, Eyes, Respiratory, Psychiatric, Allergic/Imm, Heme/Lymph Last edited by Bernarda Caffey, MD on 11/28/2021 10:10 AM.     ALLERGIES Allergies  Allergen Reactions   Ace Inhibitors Cough   Atorvastatin Other (See Comments)    Leg pain    Benadryl [Diphenhydramine] Itching    Benadryl cream   Peppermint Flavor [Flavoring Agent]     Causes horsenss   PAST MEDICAL HISTORY Past Medical History:  Diagnosis Date   Anxiety    Breast cancer ([place])     Cancer ([place])    Diabetes mellitus without complication ([place])    Dysphagia    GERD (gastroesophageal reflux disease) 10/12/2020   Hair loss 10/12/2020   Heart murmur    Hx of adenomatous polyp of colon 06/2015   [name]- [facility]   Hypertension    Irregular heart beat    Sleep apnea    Past Surgical History:  Procedure Laterality Date   BREAST EXCISIONAL BIOPSY Bilateral    BREAST LUMPECTOMY Left 2016   FAMILY HISTORY Family History  Problem Relation Age of Onset   Heart failure Mother        CAD   COPD Mother    Hypertension Mother    Cancer Father    Rectal cancer Maternal Grandmother        Unsure   Colon cancer Maternal Grandmother    Heart attack Maternal Grandfather    Esophageal cancer Neg Hx    Stomach cancer Neg Hx    SOCIAL HISTORY Social History   Tobacco Use   Smoking status: Never   Smokeless tobacco: Never  Vaping Use   Vaping Use: Never used  Substance Use Topics   Alcohol use: Never   Drug use: Never       OPHTHALMIC EXAM:  Base Eye Exam     Visual Acuity (Snellen - Linear)       Right Left   Dist cc 20/25 +2 20/25 +2    Correction: Glasses         Tonometry (Tonopen, 9:08 AM)       Right Left   Pressure 17 19         Pupils       Dark Light Shape React APD   Right 3 2 Round Brisk None   Left 3 2 Round Brisk None         Visual Fields       Left Right    Full Full         Extraocular Movement       Right Left    Full, Ortho Full, Ortho         Neuro/Psych     Oriented x3: Yes   Mood/Affect: Normal         Dilation     Both eyes: 1.0% Mydriacyl, 2.5% Phenylephrine @ 9:05 AM           Slit Lamp and Fundus Exam     Slit Lamp Exam       Right Left   Lids/Lashes Dermatochalasis - upper lid, mild Ptosis Dermatochalasis - upper lid, Meibomian gland dysfunction, mild Ptosis   Conjunctiva/Sclera mild melanosis, white and quiet mild melanosis, white and quiet   Cornea mild arcus mild  tear film debris mild arcus, mild tear film debris   Anterior Chamber deep and clear deep and clear   Iris round and dilated, no NVI round and dilated, no  NVI   Lens 2-3+ Nuclear sclerosis with mild brunescence, 2-3+ Cortical cataract 2-3+ Nuclear sclerosis with mild brunescence, 2-3+ Cortical cataract   Anterior Vitreous syneresis, PVD syneresis, PVD         Fundus Exam       Right Left   Disc pink and sharp, mild cupping, mild PPA/PPP pink and sharp   C/D Ratio 0.6 0.6   Macula flat, good foveal reflex, mild RPE mottling and clumping, no heme or edema flat, good foveal reflex, mild RPE mottling and clumping, no heme or edema   Vessels mild attenuation, mild tortuosity mild attenuation, mild tortuosity   Periphery attached, focal pigment clump at 0700, no heme attached, no heme            IMAGING AND PROCEDURES  Imaging and Procedures for _0 @  OCT, Retina - OU - Both Eyes       Right Eye Quality was good. Central Foveal Thickness: 230. Progression has been stable. Findings include normal foveal contour, no IRF, no SRF.   Left Eye Quality was good. Central Foveal Thickness: 234. Progression has been stable. Findings include normal foveal contour, no IRF, no SRF.   Notes *Images captured and stored on drive  Diagnosis / Impression:  OU: NFP, no IRF/SRF No DME OU  Clinical management:  See below  Abbreviations: NFP - Normal foveal profile. CME - cystoid macular edema. PED - pigment epithelial detachment. IRF - intraretinal fluid. SRF - subretinal fluid. EZ - ellipsoid zone. ERM - epiretinal membrane. ORA - outer retinal atrophy. ORT - outer retinal tubulation. SRHM - subretinal hyper-reflective material            ASSESSMENT/PLAN:    ICD-10-CM   1. Diabetes mellitus type 2 without retinopathy ([place])  E11.9 OCT, Retina - OU - Both Eyes    2. Essential hypertension  I10     3. Hypertensive retinopathy of both eyes  H35.033     4. Posterior vitreous  detachment of both eyes  H43.813     5. Combined forms of age-related cataract of both eyes  H25.813     6. Dermatochalasis of both upper eyelids  H02.831    H02.834     7. Ptosis of both upper eyelids  H02.403      1. Diabetes mellitus, type 2 without retinopathy OU -- stable  - last A1c 7.5% (06.27.23), 6.9% (05.16.22)  - The incidence, risk factors for progression, natural history and treatment options for diabetic retinopathy  were discussed with patient.    - The need for close monitoring of blood glucose, blood pressure, and serum lipids, avoiding cigarette or any type of tobacco, and the need for long term follow up was also discussed with patient.   - pt reports seeing Dr. Katy Fitch regularly for general ophthalmology  - okay to do diabetic eye exams at [facility]  - pt is cleared from a retina standpoint for release to Dr. Katy Fitch and resumption of primary eye care  - pt can f/u here prn  2,3. Hypertensive retinopathy OU  - discussed importance of tight BP control  - monitor   4. PVD / vitreous syneresis OU  - Discussed findings and prognosis  - No RT or RD on 360 peripheral exam  - Reviewed s/s of RT/RD  - Strict return precautions for any such RT/RD signs/symptoms   5. Mixed cataracts OU  - The symptoms of cataract, surgical options, and treatments and risks were discussed with patient.  -  discussed diagnosis and progression   - Pt under the expert management of [facility]  6,7. Dermatocholasis, bilateral upper lids; Ptosis OU  - Patient was referred to Sarajane Marek, MD ([facility] for eval)  Ophthalmic Meds Ordered this visit:  No orders of the defined types were placed in this encounter.    Return if symptoms worsen or fail to improve.  There are no Patient Instructions on file for this visit.   Explained the diagnoses, plan, and follow up with the patient and they expressed understanding.  Patient expressed understanding of the importance of proper  follow up care.   This document serves as a record of services personally performed by Gardiner Sleeper, MD, PhD. It was created on their behalf by San Jetty. Owens Shark, OA an ophthalmic technician. The creation of this record is the provider's dictation and/or activities during the visit.    Electronically signed by: San Jetty. Owens Shark, [place] 07.06.2023 10:16 AM  Gardiner Sleeper, M.D., Ph.D. Diseases & Surgery of the Retina and Vitreous [facility]  I have reviewed the above documentation for accuracy and completeness, and I agree with the above. Gardiner Sleeper, M.D., Ph.D. 11/28/21 10:16 AM  Abbreviations: M myopia (nearsighted); A astigmatism; H hyperopia (farsighted); P presbyopia; Mrx spectacle prescription;  CTL contact lenses; OD right eye; OS left eye; OU both eyes  XT exotropia; ET esotropia; PEK punctate epithelial keratitis; PEE punctate epithelial erosions; DES dry eye syndrome; MGD meibomian gland dysfunction; ATs artificial tears; PFAT's preservative free artificial tears; [place] nuclear sclerotic cataract; PSC posterior subcapsular cataract; ERM epi-retinal membrane; PVD posterior vitreous detachment; RD retinal detachment; DM diabetes mellitus; DR diabetic retinopathy; NPDR non-proliferative diabetic retinopathy; PDR proliferative diabetic retinopathy; CSME clinically significant macular edema; DME diabetic macular edema; dbh dot blot hemorrhages; CWS cotton wool spot; POAG primary open angle glaucoma; C/D cup-to-disc ratio; HVF humphrey visual field; GVF goldmann visual field; OCT optical coherence tomography; IOP intraocular pressure; BRVO Branch retinal vein occlusion; CRVO central retinal vein occlusion; CRAO central retinal artery occlusion; BRAO branch retinal artery occlusion; RT retinal tear; SB scleral buckle; PPV pars plana vitrectomy; VH Vitreous hemorrhage; PRP panretinal laser photocoagulation; IVK intravitreal kenalog; VMT vitreomacular traction; MH Macular hole;   NVD neovascularization of the disc; NVE neovascularization elsewhere; AREDS age related eye disease study; ARMD age related macular degeneration; POAG primary open angle glaucoma; EBMD epithelial/anterior basement membrane dystrophy; ACIOL anterior chamber intraocular lens; IOL intraocular lens; PCIOL posterior chamber intraocular lens; Phaco/IOL phacoemulsification with intraocular lens placement; [place] photorefractive keratectomy; LASIK laser assisted in situ keratomileusis; HTN hypertension; DM diabetes mellitus; COPD chronic obstructive pulmonary disease

## 2021-11-19 ENCOUNTER — Other Ambulatory Visit: Payer: Medicare PPO

## 2021-11-19 NOTE — Progress Notes (Signed)
Scheduled patient for 20-monthf/u for health coaching per agreement with patient once the template to schedule for September was made available per note from 3/16. Patient has been schedule for 9/15 at 2:30pm for a telephone appointment.    Chrsitopher Wik LTruman Hayward CEdmonds Endoscopy CenterCSaint ALPhonsus Medical Center - NampaGuide, Health Coach [address] [id number]: [phone]: [email or web address]

## 2021-11-21 ENCOUNTER — Other Ambulatory Visit: Payer: Medicare PPO

## 2021-11-25 ENCOUNTER — Telehealth: Payer: Self-pay | Admitting: *Deleted

## 2021-11-25 NOTE — Chronic Care Management (AMB) (Signed)
  Chronic Care Management   Note  11/25/2021 Name: Jasmine Lopez MRN: [id number] DOB: [birth year]  Jasmine Lopez is a 81 y.o. year old female who is a primary care patient of [name], [place]. I reached out to Cheryl Flash by phone today in response to a referral sent by Ms. Michel J Lesh's PCP.  [name] was given information about Chronic Care Management services today including:  CCM service includes personalized support from designated clinical staff supervised by her physician, including individualized plan of care and coordination with other care providers 24/7 contact phone numbers for assistance for urgent and routine care needs. Service will only be billed when office clinical staff spend 20 minutes or more in a month to coordinate care. Only one practitioner may furnish and bill the service in a calendar month. The patient may stop CCM services at any time (effective at the end of the month) by phone call to the office staff. The patient is responsible for co-pay (up to 20% after annual deductible is met) if co-pay is required by the individual health plan.   Patient agreed to services and verbal consent obtained.   Follow up plan: Telephone appointment with care management team member scheduled for:12/03/21  [place]: [phone]

## 2021-11-26 ENCOUNTER — Ambulatory Visit: Payer: Medicare PPO | Admitting: Neurology

## 2021-11-26 DIAGNOSIS — R55 Syncope and collapse: Secondary | ICD-10-CM | POA: Diagnosis not present

## 2021-11-27 ENCOUNTER — Ambulatory Visit (INDEPENDENT_AMBULATORY_CARE_PROVIDER_SITE_OTHER): Payer: Medicare PPO

## 2021-11-27 ENCOUNTER — Encounter: Payer: Self-pay | Admitting: *Deleted

## 2021-11-27 ENCOUNTER — Encounter (HOSPITAL_BASED_OUTPATIENT_CLINIC_OR_DEPARTMENT_OTHER): Payer: Self-pay | Admitting: Cardiovascular Disease

## 2021-11-27 DIAGNOSIS — R55 Syncope and collapse: Secondary | ICD-10-CM

## 2021-11-27 LAB — ECHOCARDIOGRAM COMPLETE
AR max vel: 2.17 cm2
AV Area VTI: 2.16 cm2
AV Area mean vel: 2.09 cm2
AV Mean grad: 4 mmHg
AV Peak grad: 8 mmHg
Ao pk vel: 1.41 m/s
Area-P 1/2: 2.66 cm2
S' Lateral: 1.8 cm

## 2021-11-28 ENCOUNTER — Encounter (HOSPITAL_BASED_OUTPATIENT_CLINIC_OR_DEPARTMENT_OTHER): Payer: Self-pay

## 2021-11-28 ENCOUNTER — Encounter (INDEPENDENT_AMBULATORY_CARE_PROVIDER_SITE_OTHER): Payer: Self-pay | Admitting: Ophthalmology

## 2021-11-28 ENCOUNTER — Telehealth (HOSPITAL_BASED_OUTPATIENT_CLINIC_OR_DEPARTMENT_OTHER): Payer: Self-pay

## 2021-11-28 ENCOUNTER — Ambulatory Visit (INDEPENDENT_AMBULATORY_CARE_PROVIDER_SITE_OTHER): Payer: Medicare PPO | Admitting: Ophthalmology

## 2021-11-28 DIAGNOSIS — H02831 Dermatochalasis of right upper eyelid: Secondary | ICD-10-CM | POA: Diagnosis not present

## 2021-11-28 DIAGNOSIS — H02834 Dermatochalasis of left upper eyelid: Secondary | ICD-10-CM | POA: Diagnosis not present

## 2021-11-28 DIAGNOSIS — H43813 Vitreous degeneration, bilateral: Secondary | ICD-10-CM

## 2021-11-28 DIAGNOSIS — H35033 Hypertensive retinopathy, bilateral: Secondary | ICD-10-CM

## 2021-11-28 DIAGNOSIS — H25813 Combined forms of age-related cataract, bilateral: Secondary | ICD-10-CM | POA: Diagnosis not present

## 2021-11-28 DIAGNOSIS — H02403 Unspecified ptosis of bilateral eyelids: Secondary | ICD-10-CM

## 2021-11-28 DIAGNOSIS — I10 Essential (primary) hypertension: Secondary | ICD-10-CM

## 2021-11-28 DIAGNOSIS — E119 Type 2 diabetes mellitus without complications: Secondary | ICD-10-CM

## 2021-11-28 DIAGNOSIS — H02401 Unspecified ptosis of right eyelid: Secondary | ICD-10-CM

## 2021-11-28 MED ORDER — ASPIRIN 81 MG PO TBEC: 81.0000 mg | DELAYED_RELEASE_TABLET | Freq: Every day | ORAL | 3 refills | Status: DC

## 2021-11-28 NOTE — Telephone Encounter (Addendum)
Called results to patient and left results on VM (ok per DPR), instructions left to call office back if patient has any questions! Rx sent to pharmacy.      ----- Message from Loel Dubonnet, NP sent at 11/28/2021 11:05 AM EDT ----- Carotid duplex with only mild stenosis 1-39% to the right ICA.  Continue rosuvastatin.  Recommend adding aspirin EC 81 mg daily.  Not enough obstruction to cause her syncope.

## 2021-11-29 ENCOUNTER — Telehealth (HOSPITAL_BASED_OUTPATIENT_CLINIC_OR_DEPARTMENT_OTHER): Payer: Self-pay | Admitting: Cardiovascular Disease

## 2021-11-29 DIAGNOSIS — Z0000 Encounter for general adult medical examination without abnormal findings: Secondary | ICD-10-CM

## 2021-11-29 NOTE — Telephone Encounter (Signed)
Patient called stating Jasmine Lopez counsels her and she is concerned about the scheduling of medication and diet.

## 2021-11-29 NOTE — Telephone Encounter (Signed)
Please advise 

## 2021-12-02 ENCOUNTER — Other Ambulatory Visit: Payer: Self-pay | Admitting: Nurse Practitioner

## 2021-12-02 DIAGNOSIS — R55 Syncope and collapse: Secondary | ICD-10-CM | POA: Diagnosis not present

## 2021-12-02 MED ORDER — ROSUVASTATIN CALCIUM 20 MG PO TABS: ORAL_TABLET | ORAL | 1 refills | Status: DC

## 2021-12-03 ENCOUNTER — Ambulatory Visit (INDEPENDENT_AMBULATORY_CARE_PROVIDER_SITE_OTHER): Payer: Medicare PPO

## 2021-12-03 ENCOUNTER — Telehealth: Payer: Medicare PPO

## 2021-12-03 DIAGNOSIS — E785 Hyperlipidemia, unspecified: Secondary | ICD-10-CM

## 2021-12-03 DIAGNOSIS — E782 Mixed hyperlipidemia: Secondary | ICD-10-CM

## 2021-12-03 DIAGNOSIS — W19XXXA Unspecified fall, initial encounter: Secondary | ICD-10-CM

## 2021-12-03 DIAGNOSIS — I10 Essential (primary) hypertension: Secondary | ICD-10-CM

## 2021-12-03 DIAGNOSIS — R55 Syncope and collapse: Secondary | ICD-10-CM

## 2021-12-04 ENCOUNTER — Ambulatory Visit: Payer: Medicare PPO | Admitting: Nurse Practitioner

## 2021-12-04 ENCOUNTER — Ambulatory Visit (INDEPENDENT_AMBULATORY_CARE_PROVIDER_SITE_OTHER): Payer: Medicare PPO

## 2021-12-04 VITALS — BP 114/58 | HR 61 | Temp 98.0°F | Ht 65.0 in | Wt 184.6 lb

## 2021-12-04 DIAGNOSIS — Z0000 Encounter for general adult medical examination without abnormal findings: Secondary | ICD-10-CM

## 2021-12-04 NOTE — Chronic Care Management (AMB) (Signed)
Chronic Care Management   CCM RN Visit Note  12/03/2021 Name: Jasmine Lopez MRN: [id number] DOB: [birth year]  Subjective: Jasmine Lopez is a 81 y.o. year old female who is a primary care patient of [name], [place]. The care management team was consulted for assistance with disease management and care coordination needs.    Engaged with patient by telephone for initial visit in response to provider referral for case management and/or care coordination services.   Consent to Services:  The patient was given information about Chronic Care Management services, agreed to services, and gave verbal consent prior to initiation of services.  Please see initial visit note for detailed documentation.   Patient agreed to services and verbal consent obtained.   Assessment: Review of patient past medical history, allergies, medications, health status, including review of consultants reports, laboratory and other test data, was performed as part of comprehensive evaluation and provision of chronic care management services.   SDOH (Social Determinants of Health) assessments and interventions performed:  Yes, LCSW referral sent for grief counseling   CCM Care Plan  Allergies  Allergen Reactions   Ace Inhibitors Cough   Atorvastatin Other (See Comments)    Leg pain    Benadryl [Diphenhydramine] Itching    Benadryl cream   Peppermint Flavor [Flavoring Agent]     Causes horsenss    Outpatient Encounter Medications as of 12/03/2021  Medication Sig   Accu-Chek Softclix Lancets lancets Use to check blood sugars twice daily E11.69   acetaminophen (TYLENOL) 500 MG tablet Take 500 mg by mouth every 6 (six) hours as needed.   allopurinol (ZYLOPRIM) 100 MG tablet Take 1 tablet (100 mg total) by mouth daily.   amLODipine (NORVASC) 10 MG tablet TAKE 1 TABLET(10 MG) BY MOUTH DAILY   aspirin EC 81 MG tablet Take 1 tablet (81 mg total) by mouth daily. Swallow whole.   Biotin 1000 MCG tablet biotin    blood glucose meter kit and supplies KIT Dispense based on patient and insurance preference. Use up to two times daily as directed. (FOR ICD-9 250.00, 250.01).   buPROPion (WELLBUTRIN XL) 150 MG 24 hr tablet TAKE 1 TABLET(150 MG) BY MOUTH EVERY MORNING   carvedilol (COREG) 6.25 MG tablet Take 1 tablet (6.25 mg total) by mouth 2 (two) times daily with a meal.   Cholecalciferol (VITAMIN D3) 125 MCG (5000 UT) CAPS Take 1 capsule by mouth daily at 12 noon.   clobetasol (TEMOVATE) 0.05 % external solution SMARTSIG:1 Sparingly Topical Twice Daily   Continuous Blood Gluc Receiver (FREESTYLE LIBRE 14 DAY READER) DEVI Use as directed to check blood sugars 2 times per day dx: e11.9   Continuous Blood Gluc Sensor (FREESTYLE LIBRE 14 DAY SENSOR) MISC Use as directed to check blood sugars 2 times per day dx: e11.9 (Patient not taking: Reported on 1/75/1025)   Garlic 8527 MG CAPS Take 1 capsule by mouth daily.   glucose blood (ACCU-CHEK SMARTVIEW) test strip Use to check blood sugars twice daily E11.69   metFORMIN (GLUCOPHAGE-XR) 500 MG 24 hr tablet Take 1 tablet (500 mg total) by mouth 2 (two) times daily.   Multiple Vitamin (MULTIVITAMIN) capsule Take 1 capsule by mouth daily. (Patient not taking: Reported on 12/04/2021)   rosuvastatin (CRESTOR) 20 MG tablet TAKE 1 TABLET(20 MG) BY MOUTH DAILY   spironolactone (ALDACTONE) 50 MG tablet Take 50 mg by mouth daily.   valsartan-hydrochlorothiazide (DIOVAN-HCT) 160-25 MG tablet Take 1 tablet by mouth daily.   Facility-Administered Encounter Medications as  of 12/03/2021  Medication   0.9 %  sodium chloride infusion    Patient Active Problem List   Diagnosis Date Noted   Syncope 11/06/2021   GERD (gastroesophageal reflux disease) 10/12/2020   Hair loss 10/12/2020   Memory loss 05/17/2019   Weight gain 04/15/2019   Upper airway cough syndrome with VCD component  02/02/2019   History of ductal carcinoma in situ (DCIS) of breast 12/28/2018   Pain of left breast  12/28/2018   Mixed hyperlipidemia 10/28/2018   Trigger finger, right middle finger 10/14/2018   Dry mouth, unspecified 10/14/2018   Allergic 07/15/2018   Hoarseness of voice 71/24/5809   Umbilical hernia 98/33/8250   Diastolic dysfunction 53/97/6734   Depression 11/07/2017   Heart murmur 11/07/2017   Obesity (BMI 30.0-34.9) 08/24/2016   Type 2 diabetes mellitus with hyperlipidemia ([place]) 05/01/2016   Colon polyps 07/17/2015   Vitamin D deficiency 06/12/2015   Ductal carcinoma in situ (DCIS) of left breast 02/15/2015   Papilloma of right breast 02/15/2015   Idiopathic chronic gout 11/23/2014   Non-insulin dependent type 2 diabetes mellitus ([place]) 10/13/2013   Internal derangement of knee 12/29/2012   Osteoarthrosis involving lower leg 12/29/2012   Osteopenia 03/15/2012   Essential hypertension 06/20/2011    Conditions to be addressed/monitored: Type 2 diabetes mellitus with hyperlipidemia, Essential hypertension, Mixed hyperlipidemia, fall, initial encounter, syncope, unspecified syncope type)  Care Plan : RN Care Manager Plan of Care  Updates made by Lynne Logan, RN since 12/03/2021 12:00 AM     Problem: No care established for management of chronic disease states (Type 2 diabetes mellitus with hyperlipidemia, Essential hypertension, Mixed hyperlipidemia, fall, initial encounter, syncope, unspecified syncope type)   Priority: High     Long-Range Goal: Establishment of plan of care for management of chronic disease states (Type 2 diabetes mellitus with hyperlipidemia, Essential hypertension, Mixed hyperlipidemia, fall, initial encounter, syncope, unspecified syncope type)   Start Date: 12/03/2021  Expected End Date: 06/05/2022  This Visit's Progress: On track  Priority: High  Note:   Current Barriers:  Knowledge Deficits related to plan of care for management of Type 2 diabetes mellitus with hyperlipidemia, Essential hypertension, Mixed hyperlipidemia, fall, initial encounter,  syncope, unspecified syncope type)  Chronic Disease Management support and education needs related to Type 2 diabetes mellitus with hyperlipidemia, Essential hypertension, Mixed hyperlipidemia, fall, initial encounter, syncope, unspecified syncope type)   RNCM Clinical Goal(s):  Patient will verbalize basic understanding of  Type 2 diabetes mellitus with hyperlipidemia, Essential hypertension, Mixed hyperlipidemia, fall, initial encounter, syncope, unspecified syncope type) disease process and self health management plan as evidenced by patient will report having no disease exacerbations related to her chronic health conditions as listed above  demonstrate Improved health management independence as evidenced by patient will report feelings of grief have improved as evidence by patient will report feeling more energetic and hopeful about her future continue to work with RN Care Manager to address care management and care coordination needs related to  Type 2 diabetes mellitus with hyperlipidemia, Essential hypertension, Mixed hyperlipidemia, fall, initial encounter, syncope, unspecified syncope type) as evidenced by adherence to CM Team Scheduled appointments through collaboration with RN Care manager, provider, and care team.   Interventions: 1:1 collaboration with primary care provider regarding development and update of comprehensive plan of care as evidenced by provider attestation and co-signature Inter-disciplinary care team collaboration (see longitudinal plan of care) Evaluation of current treatment plan related to  self management and patient's adherence to plan as  established by provider   Hypertension Interventions:  (Status:  New goal.) Long Term Goal Last practice recorded BP readings:  BP Readings from Last 3 Encounters:  12/04/21 (!) 114/58  11/13/21 (!) 149/66  11/06/21 139/66  Most recent eGFR/CrCl:  Lab Results  Component Value Date   EGFR 74 11/05/2021    No components found  for: "CRCL" Evaluation of current treatment plan related to hypertension self management and patient's adherence to plan as established by provider Provided education to patient re: stroke prevention, s/s of heart attack and stroke Reviewed medications with patient and discussed importance of compliance Advised patient, providing education and rationale, to monitor blood pressure daily and record, calling PCP for findings outside established parameters Provided education on prescribed diet low Sodium Discussed complications of poorly controlled blood pressure such as heart disease, stroke, circulatory complications, vision complications, kidney impairment, sexual dysfunction Assessed social determinant of health barriers  Depression/Grief:  (Status:  New goal.)  Long Term Goal Evaluation of current treatment plan related to Depression/Grief, self-management and patient's adherence to plan as established by provider Provided active listening to patient and validated her feelings of sadness due to the recent loss of her grandson  Educated patient regarding resources for grief counseling Sent referral via in basket message to LCSW to assist with grief counseling Reviewed medications with patient and discussed importance of medication adherence Instructed patient to report concerns or new symptoms to her PCP  Discussed plans with patient for ongoing care management follow up and provided patient with direct contact information for care management team  Patient Goals/Self-Care Activities: Take all medications as prescribed Attend all scheduled provider appointments Call pharmacy for medication refills 3-7 days in advance of running out of medications Perform all self care activities independently  Perform IADL's (shopping, preparing meals, housekeeping, managing finances) independently Call provider office for new concerns or questions  check blood pressure 3 times per week write blood pressure  results in a log or diary take blood pressure log to all doctor appointments call doctor for signs and symptoms of high blood pressure take medications for blood pressure exactly as prescribed report new symptoms to your doctor  Follow Up Plan:  Telephone follow up appointment with care management team member scheduled for:  01/27/22 _0 :15 AM        Barb Merino, RN, BSN, CCM Care Management Coordinator [facility]  Direct Phone: [phone]

## 2021-12-04 NOTE — Patient Instructions (Signed)
Visit Information   Thank you for taking time to visit with me today. Please don't hesitate to contact me if I can be of assistance to you before our next scheduled telephone appointment.  Following are the goals we discussed today:  (Copy and paste patient goals from clinical care plan here)  Our next appointment is by telephone on 01/27/22 at 10:30 AM   Please call the care guide team at [phone] if you need to cancel or reschedule your appointment.   If you are experiencing a Mental Health or [place] or need someone to talk to, please call 1-800-273-TALK (toll free, 24 hour hotline)   Following is a copy of your full care plan:  Care Plan : [place] of Care  Updates made by Lynne Logan, RN since 12/04/2021 12:00 AM     Problem: No care established for management of chronic disease states (Type 2 diabetes mellitus with hyperlipidemia, Essential hypertension, Mixed hyperlipidemia, fall, initial encounter, syncope, unspecified syncope type)   Priority: High     Long-Range Goal: Establishment of plan of care for management of chronic disease states (Type 2 diabetes mellitus with hyperlipidemia, Essential hypertension, Mixed hyperlipidemia, fall, initial encounter, syncope, unspecified syncope type)   Start Date: 12/03/2021  Expected End Date: 06/05/2022  This Visit's Progress: On track  Priority: High  Note:   Current Barriers:  Knowledge Deficits related to plan of care for management of Type 2 diabetes mellitus with hyperlipidemia, Essential hypertension, Mixed hyperlipidemia, fall, initial encounter, syncope, unspecified syncope type)  Chronic Disease Management support and education needs related to Type 2 diabetes mellitus with hyperlipidemia, Essential hypertension, Mixed hyperlipidemia, fall, initial encounter, syncope, unspecified syncope type)   RNCM Clinical Goal(s):  Patient will verbalize basic understanding of  Type 2 diabetes mellitus  with hyperlipidemia, Essential hypertension, Mixed hyperlipidemia, fall, initial encounter, syncope, unspecified syncope type) disease process and self health management plan as evidenced by patient will report having no disease exacerbations related to her chronic health conditions as listed above  demonstrate Improved health management independence as evidenced by patient will report feelings of grief have improved as evidence by patient will report feeling more energetic and hopeful about her future continue to work with RN Care Manager to address care management and care coordination needs related to  Type 2 diabetes mellitus with hyperlipidemia, Essential hypertension, Mixed hyperlipidemia, fall, initial encounter, syncope, unspecified syncope type) as evidenced by adherence to CM Team Scheduled appointments through collaboration with RN Care manager, provider, and care team.   Interventions: 1:1 collaboration with primary care provider regarding development and update of comprehensive plan of care as evidenced by provider attestation and co-signature Inter-disciplinary care team collaboration (see longitudinal plan of care) Evaluation of current treatment plan related to  self management and patient's adherence to plan as established by provider   Hypertension Interventions:  (Status:  New goal.) Long Term Goal Last practice recorded BP readings:  BP Readings from Last 3 Encounters:  12/04/21 (!) 114/58  11/13/21 (!) 149/66  11/06/21 139/66  Most recent eGFR/CrCl:  Lab Results  Component Value Date   EGFR 74 11/05/2021    No components found for: "CRCL" Evaluation of current treatment plan related to hypertension self management and patient's adherence to plan as established by provider Provided education to patient re: stroke prevention, s/s of heart attack and stroke Reviewed medications with patient and discussed importance of compliance Advised patient, providing education and  rationale, to monitor blood pressure daily  and record, calling PCP for findings outside established parameters Provided education on prescribed diet low Sodium Discussed complications of poorly controlled blood pressure such as heart disease, stroke, circulatory complications, vision complications, kidney impairment, sexual dysfunction Assessed social determinant of health barriers  Depression/Grief:  (Status:  New goal.)  Long Term Goal Evaluation of current treatment plan related to Depression/Grief, self-management and patient's adherence to plan as established by provider Provided active listening to patient and validated her feelings of sadness due to the recent loss of her grandson  Educated patient regarding resources for grief counseling Sent referral via in basket message to LCSW to assist with grief counseling Reviewed medications with patient and discussed importance of medication adherence Instructed patient to report concerns or new symptoms to her PCP  Discussed plans with patient for ongoing care management follow up and provided patient with direct contact information for care management team  Patient Goals/Self-Care Activities: Take all medications as prescribed Attend all scheduled provider appointments Call pharmacy for medication refills 3-7 days in advance of running out of medications Perform all self care activities independently  Perform IADL's (shopping, preparing meals, housekeeping, managing finances) independently Call provider office for new concerns or questions  check blood pressure 3 times per week write blood pressure results in a log or diary take blood pressure log to all doctor appointments call doctor for signs and symptoms of high blood pressure take medications for blood pressure exactly as prescribed report new symptoms to your doctor  Follow Up Plan:  Telephone follow up appointment with care management team member scheduled for:  01/27/22 '@10' :30  AM         Consent to CCM Services: Ms. Hennings was given information about Chronic Care Management services including:  CCM service includes personalized support from designated clinical staff supervised by her physician, including individualized plan of care and coordination with other care providers 24/7 contact phone numbers for assistance for urgent and routine care needs. Service will only be billed when office clinical staff spend 20 minutes or more in a month to coordinate care. Only one practitioner may furnish and bill the service in a calendar month. The patient may stop CCM services at any time (effective at the end of the month) by phone call to the office staff. The patient will be responsible for cost sharing (co-pay) of up to 20% of the service fee (after annual deductible is met).  Patient agreed to services and verbal consent obtained.   Patient verbalizes understanding of instructions and care plan provided today and agrees to view in [place]. Active MyChart status and patient understanding of how to access instructions and care plan via MyChart confirmed with patient.     Telephone follow up appointment with care management team member scheduled for: 01/27/22 '@10' :30 AM

## 2021-12-04 NOTE — Patient Instructions (Signed)
Jasmine Lopez , Thank you for taking time to come for your Medicare Wellness Visit. I appreciate your ongoing commitment to your health goals. Please review the following plan we discussed and let me know if I can assist you in the future.   Screening recommendations/referrals: Colonoscopy: not required Mammogram: completed 03/14/2021, due 03/15/2022 Bone Density: completed 10/03/2020 Recommended yearly ophthalmology/optometry visit for glaucoma screening and checkup Recommended yearly dental visit for hygiene and checkup  Vaccinations: Influenza vaccine: due 12/10/2021 Pneumococcal vaccine: due Tdap vaccine: completed 11/05/2021, due 11/06/2031 Shingles vaccine: discussed   Covid-19: 04/01/2021, 02/17/2020, 07/25/2019, 07/04/2019  Advanced directives: Advance directive discussed with you today. I have provided a copy for you to complete at home and have notarized. Once this is complete please bring a copy in to our office so we can scan it into your chart.   Conditions/risks identified: none  Next appointment: Follow up in one year for your annual wellness visit    Preventive Care 65 Years and Older, Female Preventive care refers to lifestyle choices and visits with your health care provider that can promote health and wellness. What does preventive care include? A yearly physical exam. This is also called an annual well check. Dental exams once or twice a year. Routine eye exams. Ask your health care provider how often you should have your eyes checked. Personal lifestyle choices, including: Daily care of your teeth and gums. Regular physical activity. Eating a healthy diet. Avoiding tobacco and drug use. Limiting alcohol use. Practicing safe sex. Taking low-dose aspirin every day. Taking vitamin and mineral supplements as recommended by your health care provider. What happens during an annual well check? The services and screenings done by your health care provider during your annual  well check will depend on your age, overall health, lifestyle risk factors, and family history of disease. Counseling  Your health care provider may ask you questions about your: Alcohol use. Tobacco use. Drug use. Emotional well-being. Home and relationship well-being. Sexual activity. Eating habits. History of falls. Memory and ability to understand (cognition). Work and work Statistician. Reproductive health. Screening  You may have the following tests or measurements: Height, weight, and BMI. Blood pressure. Lipid and cholesterol levels. These may be checked every 5 years, or more frequently if you are over 25 years old. Skin check. Lung cancer screening. You may have this screening every year starting at age 35 if you have a 30-pack-year history of smoking and currently smoke or have quit within the past 15 years. Fecal occult blood test (FOBT) of the stool. You may have this test every year starting at age 34. Flexible sigmoidoscopy or colonoscopy. You may have a sigmoidoscopy every 5 years or a colonoscopy every 10 years starting at age 32. Hepatitis C blood test. Hepatitis B blood test. Sexually transmitted disease (STD) testing. Diabetes screening. This is done by checking your blood sugar (glucose) after you have not eaten for a while (fasting). You may have this done every 1-3 years. Bone density scan. This is done to screen for osteoporosis. You may have this done starting at age 36. Mammogram. This may be done every 1-2 years. Talk to your health care provider about how often you should have regular mammograms. Talk with your health care provider about your test results, treatment options, and if necessary, the need for more tests. Vaccines  Your health care provider may recommend certain vaccines, such as: Influenza vaccine. This is recommended every year. Tetanus, diphtheria, and acellular pertussis (Tdap, Td) vaccine.  You may need a Td booster every 10 years. Zoster  vaccine. You may need this after age 35. Pneumococcal 13-valent conjugate (PCV13) vaccine. One dose is recommended after age 76. Pneumococcal polysaccharide (PPSV23) vaccine. One dose is recommended after age 36. Talk to your health care provider about which screenings and vaccines you need and how often you need them. This information is not intended to replace advice given to you by your health care provider. Make sure you discuss any questions you have with your health care provider. Document Released: 05/25/2015 Document Revised: 01/16/2016 Document Reviewed: 02/27/2015 Elsevier Interactive Patient Education  2017 [employer] Prevention in the Home Falls can cause injuries. They can happen to people of all ages. There are many things you can do to make your home safe and to help prevent falls. What can I do on the outside of my home? Regularly fix the edges of walkways and driveways and fix any cracks. Remove anything that might make you trip as you walk through a door, such as a raised step or threshold. Trim any bushes or trees on the path to your home. Use bright outdoor lighting. Clear any walking paths of anything that might make someone trip, such as rocks or tools. Regularly check to see if handrails are loose or broken. Make sure that both sides of any steps have handrails. Any raised decks and porches should have guardrails on the edges. Have any leaves, snow, or ice cleared regularly. Use sand or salt on walking paths during winter. Clean up any spills in your garage right away. This includes oil or grease spills. What can I do in the bathroom? Use night lights. Install grab bars by the toilet and in the tub and shower. Do not use towel bars as grab bars. Use non-skid mats or decals in the tub or shower. If you need to sit down in the shower, use a plastic, non-slip stool. Keep the floor dry. Clean up any water that spills on the floor as soon as it happens. Remove  soap buildup in the tub or shower regularly. Attach bath mats securely with double-sided non-slip rug tape. Do not have throw rugs and other things on the floor that can make you trip. What can I do in the bedroom? Use night lights. Make sure that you have a light by your bed that is easy to reach. Do not use any sheets or blankets that are too big for your bed. They should not hang down onto the floor. Have a firm chair that has side arms. You can use this for support while you get dressed. Do not have throw rugs and other things on the floor that can make you trip. What can I do in the kitchen? Clean up any spills right away. Avoid walking on wet floors. Keep items that you use a lot in easy-to-reach places. If you need to reach something above you, use a strong step stool that has a grab bar. Keep electrical cords out of the way. Do not use floor polish or wax that makes floors slippery. If you must use wax, use non-skid floor wax. Do not have throw rugs and other things on the floor that can make you trip. What can I do with my stairs? Do not leave any items on the stairs. Make sure that there are handrails on both sides of the stairs and use them. Fix handrails that are broken or loose. Make sure that handrails are as long as  the stairways. Check any carpeting to make sure that it is firmly attached to the stairs. Fix any carpet that is loose or worn. Avoid having throw rugs at the top or bottom of the stairs. If you do have throw rugs, attach them to the floor with carpet tape. Make sure that you have a light switch at the top of the stairs and the bottom of the stairs. If you do not have them, ask someone to add them for you. What else can I do to help prevent falls? Wear shoes that: Do not have high heels. Have rubber bottoms. Are comfortable and fit you well. Are closed at the toe. Do not wear sandals. If you use a stepladder: Make sure that it is fully opened. Do not climb a  closed stepladder. Make sure that both sides of the stepladder are locked into place. Ask someone to hold it for you, if possible. Clearly mark and make sure that you can see: Any grab bars or handrails. First and last steps. Where the edge of each step is. Use tools that help you move around (mobility aids) if they are needed. These include: Canes. Walkers. Scooters. Crutches. Turn on the lights when you go into a dark area. Replace any light bulbs as soon as they burn out. Set up your furniture so you have a clear path. Avoid moving your furniture around. If any of your floors are uneven, fix them. If there are any pets around you, be aware of where they are. Review your medicines with your doctor. Some medicines can make you feel dizzy. This can increase your chance of falling. Ask your doctor what other things that you can do to help prevent falls. This information is not intended to replace advice given to you by your health care provider. Make sure you discuss any questions you have with your health care provider. Document Released: 02/22/2009 Document Revised: 10/04/2015 Document Reviewed: 06/02/2014 Elsevier Interactive Patient Education  2017 [employer].

## 2021-12-04 NOTE — Progress Notes (Signed)
Subjective:   Jasmine Lopez is a 81 y.o. female who presents for Medicare Annual (Subsequent) preventive examination.  Review of Systems     Cardiac Risk Factors include: advanced age (>29mn, >>4women);diabetes mellitus;dyslipidemia;hypertension;obesity (BMI >30kg/m2)     Objective:    Today's Vitals   12/04/21 0856  BP: (!) 114/58  Pulse: 61  Temp: 98 F (36.7 C)  TempSrc: Oral  SpO2: 93%  Weight: 184 lb 9.6 oz (83.7 kg)  Height: _0  (1.651 m)   Body mass index is 30.72 kg/m.     12/04/2021    9:18 AM 10/24/2020    3:14 PM 10/13/2019    3:04 PM 02/14/2019   12:10 PM 10/14/2018   12:44 PM  Advanced Directives  Does Patient Have a Medical Advance Directive? No No No Yes Yes  Type of AScientist, research (medical)Living will [place] Chart?    No - copy requested No - copy requested  Would patient like information on creating a medical advance directive? Yes (MAU/Ambulatory/Procedural Areas - Information given) Yes (MAU/Ambulatory/Procedural Areas - Information given) No - Patient declined      Current Medications (verified) Outpatient Encounter Medications as of 12/04/2021  Medication Sig   Accu-Chek Softclix Lancets lancets Use to check blood sugars twice daily E11.69   acetaminophen (TYLENOL) 500 MG tablet Take 500 mg by mouth every 6 (six) hours as needed.   allopurinol (ZYLOPRIM) 100 MG tablet Take 1 tablet (100 mg total) by mouth daily.   amLODipine (NORVASC) 10 MG tablet TAKE 1 TABLET(10 MG) BY MOUTH DAILY   aspirin EC 81 MG tablet Take 1 tablet (81 mg total) by mouth daily. Swallow whole.   Biotin 1000 MCG tablet biotin   blood glucose meter kit and supplies KIT Dispense based on patient and insurance preference. Use up to two times daily as directed. (FOR ICD-9 250.00, 250.01).   buPROPion (WELLBUTRIN XL) 150 MG 24 hr tablet TAKE 1 TABLET(150 MG) BY MOUTH EVERY MORNING    carvedilol (COREG) 6.25 MG tablet Take 1 tablet (6.25 mg total) by mouth 2 (two) times daily with a meal.   Cholecalciferol (VITAMIN D3) 125 MCG (5000 UT) CAPS Take 1 capsule by mouth daily at 12 noon.   clobetasol (TEMOVATE) 0.05 % external solution SMARTSIG:1 Sparingly Topical Twice Daily   glucose blood (ACCU-CHEK SMARTVIEW) test strip Use to check blood sugars twice daily E11.69   metFORMIN (GLUCOPHAGE-XR) 500 MG 24 hr tablet Take 1 tablet (500 mg total) by mouth 2 (two) times daily.   rosuvastatin (CRESTOR) 20 MG tablet TAKE 1 TABLET(20 MG) BY MOUTH DAILY   spironolactone (ALDACTONE) 50 MG tablet Take 50 mg by mouth daily.   valsartan-hydrochlorothiazide (DIOVAN-HCT) 160-25 MG tablet Take 1 tablet by mouth daily.   Continuous Blood Gluc Receiver (FREESTYLE LIBRE 14 DAY READER) DEVI Use as directed to check blood sugars 2 times per day dx: e11.9   Continuous Blood Gluc Sensor (FREESTYLE LIBRE 14 DAY SENSOR) MISC Use as directed to check blood sugars 2 times per day dx: e11.9 (Patient not taking: Reported on 78/52/7782   Garlic 14235MG CAPS Take 1 capsule by mouth daily.   Multiple Vitamin (MULTIVITAMIN) capsule Take 1 capsule by mouth daily. (Patient not taking: Reported on 12/04/2021)   Facility-Administered Encounter Medications as of 12/04/2021  Medication   0.9 %  sodium chloride infusion    Allergies (verified) Ace inhibitors,  Atorvastatin, Benadryl [diphenhydramine], and Peppermint flavor [flavoring agent]   History: Past Medical History:  Diagnosis Date   Anxiety    Breast cancer ([place])    Cancer ([place])    Diabetes mellitus without complication ([place])    Dysphagia    GERD (gastroesophageal reflux disease) 10/12/2020   Hair loss 10/12/2020   Heart murmur    Hx of adenomatous polyp of colon 06/2015   [name]- [facility]   Hypertension    Irregular heart beat    Sleep apnea    Past Surgical History:  Procedure Laterality Date   BREAST EXCISIONAL BIOPSY Bilateral     BREAST LUMPECTOMY Left 2016   Family History  Problem Relation Age of Onset   Heart failure Mother        CAD   COPD Mother    Hypertension Mother    Cancer Father    Rectal cancer Maternal Grandmother        Unsure   Colon cancer Maternal Grandmother    Heart attack Maternal Grandfather    Esophageal cancer Neg Hx    Stomach cancer Neg Hx    Social History   Socioeconomic History   Marital status: Widowed    Spouse name: Not on file   Number of children: Not on file   Years of education: Not on file   Highest education level: Not on file  Occupational History   Occupation: retired  Tobacco Use   Smoking status: Never   Smokeless tobacco: Never  Vaping Use   Vaping Use: Never used  Substance and Sexual Activity   Alcohol use: Never   Drug use: Never   Sexual activity: Not Currently  Other Topics Concern   Not on file  Social History Narrative   Lives home alone.  Widowed.  Retired.  MS counseling education. 4 cups coffee/ wk.     Social Determinants of Health   Financial Resource Strain: Low Risk  (12/04/2021)   Overall Financial Resource Strain (CARDIA)    Difficulty of Paying Living Expenses: Not hard at all  Food Insecurity: No Food Insecurity (12/04/2021)   Hunger Vital Sign    Worried About Running Out of Food in the Last Year: Never true    Ran Out of Food in the Last Year: Never true  Transportation Needs: No Transportation Needs (12/04/2021)   PRAPARE - Hydrologist (Medical): No    Lack of Transportation (Non-Medical): No  Physical Activity: Insufficiently Active (12/04/2021)   Exercise Vital Sign    Days of Exercise per Week: 2 days    Minutes of Exercise per Session: 60 min  Stress: No Stress Concern Present (12/04/2021)   [place]    Feeling of Stress : Not at all  Social Connections: Not on file    Tobacco Counseling Counseling given: Not  Answered   Clinical Intake:  Pre-visit preparation completed: Yes  Pain : No/denies pain     Nutritional Status: BMI > 30  Obese Nutritional Risks: None Diabetes: Yes  How often do you need to have someone help you when you read instructions, pamphlets, or other written materials from your doctor or pharmacy?: 1 - Never What is the last grade level you completed in school?: graduate school  Diabetic? Yes Nutrition Risk Assessment:  Has the patient had any N/V/D within the last 2 months?  No  Does the patient have any non-healing wounds?  No  Has the patient had any unintentional weight loss or weight gain?  No   Diabetes:  Is the patient diabetic?  Yes  If diabetic, was a CBG obtained today?  No  Did the patient bring in their glucometer from home?  No  How often do you monitor your CBG's? daily.   Financial Strains and Diabetes Management:  Are you having any financial strains with the device, your supplies or your medication? No .  Does the patient want to be seen by Chronic Care Management for management of their diabetes?  No  Would the patient like to be referred to a Nutritionist or for Diabetic Management?  No   Diabetic Exams:  Diabetic Eye Exam: Completed 12/20/2020 Diabetic Foot Exam: Overdue, Pt has been advised about the importance in completing this exam. Pt is scheduled for diabetic foot exam on next appointment.   Interpreter Needed?: No  Information entered by :: NAllen LPN   Activities of Daily Living    12/04/2021    9:21 AM  In your present state of health, do you have any difficulty performing the following activities:  Hearing? 0  Vision? 0  Difficulty concentrating or making decisions? 1  Walking or climbing stairs? 0  Dressing or bathing? 0  Doing errands, shopping? 0  Preparing Food and eating ? N  Using the Toilet? N  In the past six months, have you accidently leaked urine? N  Do you have problems with loss of bowel control? N   Managing your Medications? N  Managing your Finances? N  Housekeeping or managing your Housekeeping? N    Patient Care Team: [name], FNP as PCP - General ([place]) Skeet Latch, MD as PCP - Cardiology (Cardiology) Truman Hayward, Amy Little, Claudette Stapler, RN as [place] any recent Medical Services you may have received from other than [facility] providers in the past year (date may be approximate).     Assessment:   This is a routine wellness examination for Jasmine Lopez.  Hearing/Vision screen Vision Screening - Comments:: Regular eye exams, Dr. Katy Fitch  Dietary issues and exercise activities discussed: Current Exercise Habits: Structured exercise class, Type of exercise: strength training/weights;yoga, Time (Minutes): 60, Frequency (Times/Week): 2, Weekly Exercise (Minutes/Week): 120   Goals Addressed             This Visit's Progress    Patient Stated       12/04/2021, maintaining mobility and balance and upper body strength, all over health       Depression Screen    12/04/2021    9:21 AM 11/05/2021    9:05 AM 10/24/2020    3:16 PM 11/07/2019   11:05 AM 10/13/2019    3:06 PM 08/01/2019    2:08 PM 05/19/2019   11:16 AM  PHQ 2/9 Scores  PHQ - 2 Score 0 0 0 0 0 0 0  PHQ- 9 Score    0  8     Fall Risk    12/04/2021    9:19 AM 11/05/2021    5:22 PM 11/05/2021    9:05 AM 10/24/2020    3:16 PM 11/07/2019   11:04 AM  [place] in the past year? _0 0 0  Comment passed out      Number falls in past yr: 0 0     Injury with Fall? 0 1     Risk for fall due to : Medication side effect Other (Comment)  Medication side effect   Follow up Falls evaluation completed;Education provided;Falls prevention discussed Falls evaluation completed  Falls evaluation completed;Education provided;Falls prevention discussed     FALL RISK PREVENTION PERTAINING TO THE HOME:  Any stairs in or around the home? Yes  If so, are there any without  handrails? No  Home free of loose throw rugs in walkways, pet beds, electrical cords, etc? Yes  Adequate lighting in your home to reduce risk of falls? Yes   ASSISTIVE DEVICES UTILIZED TO PREVENT FALLS:  Life alert? No  Use of a cane, walker or w/c? No  Grab bars in the bathroom? Yes  Shower chair or bench in shower? No  Elevated toilet seat or a handicapped toilet? Yes   TIMED UP AND GO:  Was the test performed? No .    Gait steady and fast without use of assistive device  Cognitive Function:    11/06/2021    2:00 PM 11/06/2020    2:12 PM 11/01/2019    7:38 AM 05/17/2019    1:28 PM  MMSE - Mini Mental State Exam  Orientation to time _0 Orientation to Place _1 Registration _2 Attention/ Calculation _3 Recall _4 Language- name 2 objects _5 Language- repeat _6 Language- follow 3 step command _7 Language- read & follow direction _8 Write a sentence _9 Copy design _10 0  Copy design-comments   10 animals   Total score _11 12/04/2021    9:23 AM 11/05/2021   10:00 AM 10/24/2020    3:19 PM 10/13/2019    3:07 PM 10/14/2018   12:49 PM  6CIT Screen  What Year? 0 points 0 points 0 points 0 points 0 points  What month? 0 points 0 points 0 points 0 points 0 points  What time? 0 points 0 points 0 points 0 points 0 points  Count back from 20 0 points 0 points 2 points 0 points 0 points  Months in reverse 0 points 0 points 0 points 2 points 0 points  Repeat phrase 0 points 0 points 0 points 0 points 0 points  Total Score 0 points 0 points 2 points 2 points 0 points    Immunizations Immunization History  Administered Date(s) Administered   PFIZER(Purple Top)SARS-COV-2 Vaccination 07/04/2019, 07/25/2019, 02/17/2020   Pfizer Covid-19 Vaccine Bivalent Booster 30yr & up 04/01/2021   Tdap 11/05/2021    TDAP status: Up to date  Flu Vaccine status: Declined, Education has been provided regarding the  importance of this vaccine but patient still declined. Advised may receive this vaccine at local pharmacy or Health Dept. Aware to provide a copy of the vaccination record if obtained from local pharmacy or Health Dept. Verbalized acceptance and understanding.  Pneumococcal vaccine status: Declined,  Education has been provided regarding the importance of this vaccine but patient still declined. Advised may receive this vaccine at local pharmacy or Health Dept. Aware to provide a copy of the vaccination record if obtained from local pharmacy or Health Dept. Verbalized acceptance and understanding.   Covid-19 vaccine status: Completed vaccines  Qualifies for Shingles Vaccine? Yes   Zostavax completed No   Shingrix Completed?: No.    Education has been provided regarding the importance of this  vaccine. Patient has been advised to call insurance company to determine out of pocket expense if they have not yet received this vaccine. Advised may also receive vaccine at local pharmacy or Health Dept. Verbalized acceptance and understanding.  Screening Tests Health Maintenance  Topic Date Due   FOOT EXAM  06/25/2021   Zoster Vaccines- Shingrix (1 of 2) 02/05/2022 (Originally 08/31/1959)   Pneumonia Vaccine 16+ Years old (1 - PCV) 06/06/2022 (Originally 09/01/2007)   INFLUENZA VACCINE  12/10/2021   OPHTHALMOLOGY EXAM  12/20/2021   HEMOGLOBIN A1C  05/07/2022   COLONOSCOPY (Pts 45-11yr Insurance coverage will need to be confirmed)  01/10/2026   TETANUS/TDAP  11/06/2031   DEXA SCAN  Completed   COVID-19 Vaccine  Completed   Hepatitis C Screening  Completed   HPV VACCINES  Aged Out    Health Maintenance  Health Maintenance Due  Topic Date Due   FOOT EXAM  06/25/2021    Colorectal cancer screening: No longer required.   Mammogram status: Completed 03/14/2021. Repeat every year  Bone Density status: Completed 10/03/2020.   Lung Cancer Screening: (Low Dose CT Chest recommended if Age 79-80 years, 30 pack-year currently smoking OR have quit w/in 15years.) does not qualify.   Lung Cancer Screening Referral: no  Additional Screening:  Hepatitis C Screening: does qualify; Completed 02/16/2020  Vision Screening: Recommended annual ophthalmology exams for early detection of glaucoma and other disorders of the eye. Is the patient up to date with their annual eye exam?  Yes  Who is the provider or what is the name of the office in which the patient attends annual eye exams? Dr. GKaty Fitch If pt is not established with a provider, would they like to be referred to a provider to establish care? No .   Dental Screening: Recommended annual dental exams for proper oral hygiene  Community Resource Referral / Chronic Care Management: CRR required this visit?  No   CCM required this visit?  No      Plan:     I have personally reviewed and noted the following in the patient's chart:   Medical and social history Use of alcohol, tobacco or illicit drugs  Current medications and supplements including opioid prescriptions.  Functional ability and status Nutritional status Physical activity Advanced directives List of other physicians Hospitalizations, surgeries, and ER visits in previous 12 months Vitals Screenings to include cognitive, depression, and falls Referrals and appointments  In addition, I have reviewed and discussed with patient certain preventive protocols, quality metrics, and best practice recommendations. A written personalized care plan for preventive services as well as general preventive health recommendations were provided to patient.     NKellie Simmering LPN   [id number]  Nurse Notes: none

## 2021-12-06 NOTE — Progress Notes (Signed)
This encounter was created in error - please disregard.

## 2021-12-09 DIAGNOSIS — E785 Hyperlipidemia, unspecified: Secondary | ICD-10-CM

## 2021-12-09 DIAGNOSIS — I10 Essential (primary) hypertension: Secondary | ICD-10-CM | POA: Diagnosis not present

## 2021-12-09 DIAGNOSIS — E1169 Type 2 diabetes mellitus with other specified complication: Secondary | ICD-10-CM

## 2021-12-09 DIAGNOSIS — E782 Mixed hyperlipidemia: Secondary | ICD-10-CM | POA: Diagnosis not present

## 2021-12-10 NOTE — Telephone Encounter (Signed)
Called patient regarding concerns with medication scheduling and diet per message received. Patient shared that she had gotten off track with her eating and medications due to the passing of her grandson. Patient stated that she is bad at taking herself off medication without approval and that she had not taken her Wellbutrin in about three weeks prior to the incident. Patient mentioned that she was trying to wean herself off the medicine to see how she would function. Patient shared that she believes that if she had been taking her medication as prescribed that she would have been able to control herself better. Discussed with patient to call her pharmacy to see if she had any refills left on her medication so she can resume taking it or talk to her FNP about her stopping the medication and wanting to restart.   Patient stated that because of her emotional stated, she was not been strict with her eating habits, but has worked to get back on track since 7/29. Patient shared that she has been drinking more water, especially since it was believed that her passing out was due partially to dehydration. Patient mentioned that her A1C has improved but she wants to work to improve it to where she doesn't have to take metformin. Asked patient if she was watching her sodium consumption. Patient stated that she is trying to be careful about what she is eating because of the amount of salt that is in everything. Informed patient that she can consider the idea of speaking with a nutritionist to help her work more specifically on her eating behaviors that can help improve her diabetes and hypertension management.  Patient shared that she continues to practice her gratitude reflections, and have reflected on the incident to help her process her questions and emotions in a positive manner. Patient stressed that she was trying to be the strong grandmother and mother for everyone, but has been able to express her emotions and  come to an acceptance of her grandson's passing. Patient stated that she is better, and will talk to her FNP about her anxiety medication.   Provided patient with my direct contact number in the event that she needed to discuss additional steps or resources before our 18-monthf/u session on 9/15. Patient is taking all other medications as prescribed and will consider if she would like a referral for nutrition by her next health coaching session.   Oluwaseyi Tull LTruman Hayward CSaint Joseph HospitalCSelect Speciality Hospital Of Fort MyersGuide, Health Coach [address] [id number]: [phone]: [email or web address]

## 2021-12-10 NOTE — Procedures (Signed)
   HISTORY: 81 year old female with cognitive impairment, passing out episode  TECHNIQUE:  This is a routine 16 channel EEG recording with one channel devoted to a limited EKG recording.  It was performed during wakefulness, drowsiness and asleep.  Hyperventilation and photic stimulation were performed as activating procedures.  There are minimum muscle and movement artifact noted.  Upon maximum arousal, posterior dominant waking rhythm consistent of low amplitude mild dysrhythmic alpha range activity. Activities are symmetric over the bilateral posterior derivations and attenuated with eye opening.  Hyperventilation produced mild/moderate buildup with higher amplitude and the slower activities noted.  Photic stimulation did not alter the tracing.  During EEG recording, patient developed drowsiness and no deeper stage of sleep was achieved  During EEG recording, there was no epileptiform discharge noted.  EKG demonstrate irregular cardiac rhythm of 56 bpm  CONCLUSION: This is a  normal awake EEG.  There is no electrodiagnostic evidence of epileptiform discharge.  Marcial Pacas, M.D. Ph.D.  [facility] [place], [place] [address] Phone: [phone] Fax:      [phone]

## 2021-12-12 ENCOUNTER — Other Ambulatory Visit: Payer: Self-pay

## 2021-12-12 DIAGNOSIS — F32A Depression, unspecified: Secondary | ICD-10-CM

## 2021-12-12 MED ORDER — BUPROPION HCL ER (XL) 150 MG PO TB24: ORAL_TABLET | ORAL | 1 refills | Status: DC

## 2021-12-16 DIAGNOSIS — M1990 Unspecified osteoarthritis, unspecified site: Secondary | ICD-10-CM | POA: Diagnosis not present

## 2021-12-16 DIAGNOSIS — E1140 Type 2 diabetes mellitus with diabetic neuropathy, unspecified: Secondary | ICD-10-CM | POA: Diagnosis not present

## 2021-12-16 DIAGNOSIS — Z800 Family history of malignant neoplasm of digestive organs: Secondary | ICD-10-CM | POA: Diagnosis not present

## 2021-12-16 DIAGNOSIS — F4323 Adjustment disorder with mixed anxiety and depressed mood: Secondary | ICD-10-CM | POA: Diagnosis not present

## 2021-12-16 DIAGNOSIS — I2510 Atherosclerotic heart disease of native coronary artery without angina pectoris: Secondary | ICD-10-CM | POA: Diagnosis not present

## 2021-12-16 DIAGNOSIS — I110 Hypertensive heart disease with heart failure: Secondary | ICD-10-CM | POA: Diagnosis not present

## 2021-12-16 DIAGNOSIS — I509 Heart failure, unspecified: Secondary | ICD-10-CM | POA: Diagnosis not present

## 2021-12-16 DIAGNOSIS — Z6832 Body mass index (BMI) 32.0-32.9, adult: Secondary | ICD-10-CM | POA: Diagnosis not present

## 2021-12-16 DIAGNOSIS — E1151 Type 2 diabetes mellitus with diabetic peripheral angiopathy without gangrene: Secondary | ICD-10-CM | POA: Diagnosis not present

## 2021-12-16 DIAGNOSIS — M4800 Spinal stenosis, site unspecified: Secondary | ICD-10-CM | POA: Diagnosis not present

## 2021-12-16 DIAGNOSIS — Z7984 Long term (current) use of oral hypoglycemic drugs: Secondary | ICD-10-CM | POA: Diagnosis not present

## 2021-12-16 DIAGNOSIS — Z818 Family history of other mental and behavioral disorders: Secondary | ICD-10-CM | POA: Diagnosis not present

## 2021-12-16 DIAGNOSIS — Z8249 Family history of ischemic heart disease and other diseases of the circulatory system: Secondary | ICD-10-CM | POA: Diagnosis not present

## 2021-12-16 DIAGNOSIS — F331 Major depressive disorder, recurrent, moderate: Secondary | ICD-10-CM | POA: Diagnosis not present

## 2021-12-16 DIAGNOSIS — E785 Hyperlipidemia, unspecified: Secondary | ICD-10-CM | POA: Diagnosis not present

## 2021-12-16 DIAGNOSIS — E669 Obesity, unspecified: Secondary | ICD-10-CM | POA: Diagnosis not present

## 2021-12-16 DIAGNOSIS — Z7982 Long term (current) use of aspirin: Secondary | ICD-10-CM | POA: Diagnosis not present

## 2021-12-16 DIAGNOSIS — Z634 Disappearance and death of family member: Secondary | ICD-10-CM | POA: Diagnosis not present

## 2021-12-18 ENCOUNTER — Encounter: Payer: Self-pay | Admitting: Podiatry

## 2021-12-18 ENCOUNTER — Ambulatory Visit: Payer: Medicare PPO | Admitting: Podiatry

## 2021-12-18 ENCOUNTER — Telehealth (HOSPITAL_BASED_OUTPATIENT_CLINIC_OR_DEPARTMENT_OTHER): Payer: Self-pay

## 2021-12-18 DIAGNOSIS — B351 Tinea unguium: Secondary | ICD-10-CM

## 2021-12-18 DIAGNOSIS — M79674 Pain in right toe(s): Secondary | ICD-10-CM | POA: Diagnosis not present

## 2021-12-18 DIAGNOSIS — E1169 Type 2 diabetes mellitus with other specified complication: Secondary | ICD-10-CM

## 2021-12-18 DIAGNOSIS — E785 Hyperlipidemia, unspecified: Secondary | ICD-10-CM

## 2021-12-18 DIAGNOSIS — M79675 Pain in left toe(s): Secondary | ICD-10-CM | POA: Diagnosis not present

## 2021-12-18 DIAGNOSIS — I10 Essential (primary) hypertension: Secondary | ICD-10-CM

## 2021-12-18 MED ORDER — CARVEDILOL 6.25 MG PO TABS: ORAL_TABLET | ORAL | 3 refills | Status: DC

## 2021-12-18 NOTE — Telephone Encounter (Addendum)
Seen by patient Cheryl Flash on 12/17/2021  8:47 PM; orders placed and follow up mychart message sent to patient.     ----- Message from Loel Dubonnet, NP sent at 12/17/2021  4:52 PM EDT ----- Monitor with predominantly normal sinus rhythm. Did show episodes of a fast heart beat which correlated with her symptoms of lightheadedness.   Recommend thyroid panel to rule out abnormal thyroid function as contributory.   Ensure staying hydrated, avoiding caffeine to prevent episodes of fast heart beat.   Recommend increase Coreg to 1.5 tablets twice daily to help prevent episodes of fast heart rate. Office visit in 1-2 months to reassess symptoms

## 2021-12-18 NOTE — Progress Notes (Signed)
  Subjective:  Patient ID: Jasmine Lopez, female    DOB: 21-Sep-1940,   MRN: [id number]  No chief complaint on file.   81 y.o. female presents for concern of thickened elongated and painful nails that are difficult to trim. Requesting to have them trimmed today. Patient relates she also has pain in the right foot in the arch of her foot. Relates any burning or tingling in her feet. Patient is diabetic and last A1c was 6.7.   . Denies any other pedal complaints. Denies n/v/f/c.   Past Medical History:  Diagnosis Date   Anxiety    Breast cancer ([place])    Cancer ([place])    Diabetes mellitus without complication ([place])    Dysphagia    GERD (gastroesophageal reflux disease) 10/12/2020   Hair loss 10/12/2020   Heart murmur    Hx of adenomatous polyp of colon 06/2015   [name]- [facility]   Hypertension    Irregular heart beat    Sleep apnea     Objective:  Physical Exam: Vascular: DP/PT pulses 2/4 bilateral. CFT <3 seconds. Normal hair growth on digits. No edema.  Skin. No lacerations or abrasions bilateral feet. Nails 1-5 are thickened discolored and elongated with subungual debris.  Musculoskeletal: MMT 5/5 bilateral lower extremities in DF, PF, Inversion and Eversion. Deceased ROM in DF of ankle joint. Tender over the dorsal midfoot area. Pain through the medial arch around first TMTJ.  Neurological: Sensation intact to light touch.   Assessment:   1. Pain due to onychomycosis of toenails of both feet   2. Type 2 diabetes mellitus with hyperlipidemia ([place])       Plan:  Patient was evaluated and treated and all questions answered. -Discussed and educated patient on diabetic foot care, especially with  regards to the vascular, neurological and musculoskeletal systems.  -Stressed the importance of good glycemic control and the detriment of not  controlling glucose levels in relation to the foot. -Discussed supportive shoes at all times and checking feet regularly.   -Mechanically debrided all nails 1-5 bilateral using sterile nail nipper and filed with dremel without incident  -Answered all patient questions -Patient to return  in 3 months for at risk foot care -Patient advised to call the office if any problems or questions arise in the meantime.   Lorenda Peck, DPM

## 2021-12-24 ENCOUNTER — Ambulatory Visit: Payer: Self-pay | Admitting: Licensed Clinical Social Worker

## 2021-12-24 ENCOUNTER — Encounter: Payer: Self-pay | Admitting: Licensed Clinical Social Worker

## 2021-12-24 NOTE — Patient Outreach (Signed)
  Care Coordination   Initial Visit Note   12/24/2021 Name: Jasmine Lopez MRN: [id number] DOB: December 10, 1940  Jasmine Lopez is a 81 y.o. year old female who sees [name], [place] for primary care. I spoke with  Cheryl Flash by phone today  What matters to the patients health and wellness today?  Social Work Arts development officer   SDOH assessments and interventions completed:  No     Care Coordination Interventions Activated:  Yes  Care Coordination Interventions:  Yes, provided   Follow up plan: Follow up call scheduled for 12/25/21    Encounter Outcome:  Pt. Scheduled   Christa See, MSW, [email or web address] Phone [phone] 4:27 PM

## 2021-12-24 NOTE — Patient Instructions (Signed)
Visit Information  Thank you for taking time to visit with me today. Please don't hesitate to contact me if I can be of assistance to you.   Our next appointment is by telephone on 12/25/21 at 10 AM  Please call the care guide team at [phone] if you need to cancel or reschedule your appointment.   If you are experiencing a Mental Health or [place] or need someone to talk to, please call the Suicide and Crisis Lifeline: 988 call 911   Patient verbalizes understanding of instructions and care plan provided today and agrees to view in [place]. Active MyChart status and patient understanding of how to access instructions and care plan via MyChart confirmed with patient.     Christa See, MSW, [email or web address] Phone [phone] 4:30 PM

## 2021-12-25 ENCOUNTER — Encounter: Payer: Self-pay | Admitting: Licensed Clinical Social Worker

## 2021-12-25 ENCOUNTER — Ambulatory Visit: Payer: Self-pay | Admitting: Licensed Clinical Social Worker

## 2021-12-26 ENCOUNTER — Other Ambulatory Visit: Payer: Self-pay | Admitting: Nurse Practitioner

## 2021-12-26 DIAGNOSIS — E1169 Type 2 diabetes mellitus with other specified complication: Secondary | ICD-10-CM

## 2021-12-26 DIAGNOSIS — H02413 Mechanical ptosis of bilateral eyelids: Secondary | ICD-10-CM | POA: Diagnosis not present

## 2021-12-26 DIAGNOSIS — H40013 Open angle with borderline findings, low risk, bilateral: Secondary | ICD-10-CM | POA: Diagnosis not present

## 2021-12-26 DIAGNOSIS — E782 Mixed hyperlipidemia: Secondary | ICD-10-CM

## 2021-12-26 DIAGNOSIS — E119 Type 2 diabetes mellitus without complications: Secondary | ICD-10-CM | POA: Diagnosis not present

## 2021-12-26 DIAGNOSIS — I10 Essential (primary) hypertension: Secondary | ICD-10-CM

## 2021-12-26 DIAGNOSIS — H25813 Combined forms of age-related cataract, bilateral: Secondary | ICD-10-CM | POA: Diagnosis not present

## 2021-12-26 DIAGNOSIS — H5203 Hypermetropia, bilateral: Secondary | ICD-10-CM | POA: Diagnosis not present

## 2021-12-26 LAB — HM DIABETES EYE EXAM

## 2021-12-27 DIAGNOSIS — E1169 Type 2 diabetes mellitus with other specified complication: Secondary | ICD-10-CM

## 2021-12-27 DIAGNOSIS — E782 Mixed hyperlipidemia: Secondary | ICD-10-CM

## 2021-12-27 DIAGNOSIS — I10 Essential (primary) hypertension: Secondary | ICD-10-CM

## 2021-12-27 NOTE — Patient Outreach (Addendum)
  Care Coordination   Initial Visit Note   12/27/2021 Name: Jasmine Lopez MRN: [id number] DOB: 15-Nov-1940  Jasmine Lopez is a 81 y.o. year old female who sees [name], [place] for primary care. I spoke with  Cheryl Flash by phone today  What matters to the patients health and wellness today?  Grief Support    Goals Addressed             This Visit's Progress    Obtain Grief Support Services   On track    Care Coordination Interventions: Mindfulness or Relaxation training provided Active listening / Reflection utilized  Emotional Support Provided Behavioral Activation reviewed Participation in counseling encouraged  Participation in support group encouraged  Verbalization of feelings encouraged   Pt was successful in identifying triggers and healthy coping skills to manage symptoms of depression/anxiety due to grief Patient identified protective factors to maintain motivation LCSW informed pt of grief support services Manufacturing engineer) Will mail information to residence, per pt request            SDOH assessments and interventions completed:  No     Care Coordination Interventions Activated:  Yes  Care Coordination Interventions:  Yes, provided   Follow up plan: Follow up call scheduled for 01/29/22  Encounter Outcome:  Pt. Visit Completed   Christa See, MSW, [email or web address] Phone [phone] 9:02 AM

## 2021-12-27 NOTE — Patient Instructions (Signed)
Visit Information  Thank you for taking time to visit with me today. Please don't hesitate to contact me if I can be of assistance to you.   Following are the goals we discussed today:   Goals Addressed             This Visit's Progress    Obtain Grief Support Services   On track    Care Coordination Interventions: Mindfulness or Relaxation training provided Active listening / Reflection utilized  Emotional Support Provided Behavioral Activation reviewed Participation in counseling encouraged  Participation in support group encouraged  Verbalization of feelings encouraged   Pt was successful in identifying triggers and healthy coping skills to manage symptoms of depression/anxiety due to grief Patient identified protective factors to maintain motivation LCSW informed pt of grief support services Manufacturing engineer) Will mail information to residence, per pt request            Our next appointment is by telephone on 01/29/22 at 10 AM  Please call the care guide team at [phone] if you need to cancel or reschedule your appointment.   If you are experiencing a Mental Health or [place] or need someone to talk to, please call the Suicide and Crisis Lifeline: 988 call 911   Patient verbalizes understanding of instructions and care plan provided today and agrees to view in [place]. Active MyChart status and patient understanding of how to access instructions and care plan via MyChart confirmed with patient.     Jasmine Lopez, MSW, [email or web address] Phone [phone] 9:03 AM

## 2021-12-27 NOTE — Progress Notes (Signed)
This encounter was created in error - please disregard.  This encounter was created in error - please disregard.

## 2022-01-06 ENCOUNTER — Ambulatory Visit (INDEPENDENT_AMBULATORY_CARE_PROVIDER_SITE_OTHER): Payer: Medicare PPO | Admitting: Nurse Practitioner

## 2022-01-06 ENCOUNTER — Encounter: Payer: Self-pay | Admitting: Nurse Practitioner

## 2022-01-06 VITALS — BP 128/74 | HR 59 | Temp 97.8°F | Ht 65.0 in | Wt 183.0 lb

## 2022-01-06 DIAGNOSIS — E6609 Other obesity due to excess calories: Secondary | ICD-10-CM | POA: Diagnosis not present

## 2022-01-06 DIAGNOSIS — Z6830 Body mass index (BMI) 30.0-30.9, adult: Secondary | ICD-10-CM

## 2022-01-06 DIAGNOSIS — Z0000 Encounter for general adult medical examination without abnormal findings: Secondary | ICD-10-CM | POA: Diagnosis not present

## 2022-01-06 DIAGNOSIS — E782 Mixed hyperlipidemia: Secondary | ICD-10-CM

## 2022-01-06 DIAGNOSIS — E785 Hyperlipidemia, unspecified: Secondary | ICD-10-CM

## 2022-01-06 DIAGNOSIS — Z86000 Personal history of in-situ neoplasm of breast: Secondary | ICD-10-CM | POA: Diagnosis not present

## 2022-01-06 DIAGNOSIS — E1169 Type 2 diabetes mellitus with other specified complication: Secondary | ICD-10-CM

## 2022-01-06 DIAGNOSIS — I10 Essential (primary) hypertension: Secondary | ICD-10-CM

## 2022-01-06 LAB — POCT URINALYSIS DIPSTICK
Bilirubin, UA: NEGATIVE
Blood, UA: NEGATIVE
Glucose, UA: NEGATIVE
Ketones, UA: NEGATIVE
Nitrite, UA: NEGATIVE
Protein, UA: NEGATIVE
Spec Grav, UA: 1.01 (ref 1.010–1.025)
Urobilinogen, UA: 0.2 E.U./dL
pH, UA: 6.5 (ref 5.0–8.0)

## 2022-01-06 NOTE — Progress Notes (Unsigned)
I,Tianna Badgett,acting as a Education administrator for [employer], FNP.,have documented all relevant documentation on the behalf of Jasmine Brine, FNP,as directed by  Jasmine Brine, FNP while in the presence of Jasmine Lopez, [place].  Subjective:     Patient ID: Jasmine Lopez , female    DOB: [birth year] , 81 y.o.   MRN: [id number]   Chief Complaint  Patient presents with   Annual Exam    HPI  The patient is here today for a physical examination.   Eye exam requested from Dr Katy Fitch. She has seen Neurology since her last visit for Syncope, did not find anything different. She has also seen by Cardiology and no new findings. She is to have a blood test done for Cardiology. She had some narrowing to her right carotid but will have blood work done again. She wore a heart monitor which indicated she needs to cut back on her caffeine and thought may have been related to anxiety.  She has lost a grandchild since her last visit and [facility] with [facility] for information about grief counseling. She has restarted taking her Wellbutrin. Her grandson committed suicide since her last office.     Other This is a chronic problem. Pertinent negatives include no abdominal pain or chest pain.     Past Medical History:  Diagnosis Date   Anxiety    Breast cancer ([place])    Cancer ([place])    Diabetes mellitus without complication ([place])    Dysphagia    GERD (gastroesophageal reflux disease) 10/12/2020   Hair loss 10/12/2020   Heart murmur    Hx of adenomatous polyp of colon 06/2015   [name]- [facility]   Hypertension    Irregular heart beat    Sleep apnea      Family History  Problem Relation Age of Onset   Heart failure Mother        CAD   COPD Mother    Hypertension Mother    Cancer Father    Rectal cancer Maternal Grandmother        Unsure   Colon cancer Maternal Grandmother    Heart attack Maternal Grandfather    Esophageal cancer Neg Hx    Stomach cancer Neg Hx      Current Outpatient  Medications:    Accu-Chek Softclix Lancets lancets, Use to check blood sugars twice daily E11.69, Disp: 100 each, Rfl: 2   acetaminophen (TYLENOL) 500 MG tablet, Take 500 mg by mouth every 6 (six) hours as needed., Disp: , Rfl:    allopurinol (ZYLOPRIM) 100 MG tablet, Take 1 tablet (100 mg total) by mouth daily., Disp: 90 tablet, Rfl: 0   amLODipine (NORVASC) 10 MG tablet, TAKE 1 TABLET(10 MG) BY MOUTH DAILY, Disp: 90 tablet, Rfl: 2   aspirin EC 81 MG tablet, Take 1 tablet (81 mg total) by mouth daily. Swallow whole., Disp: 90 tablet, Rfl: 3   Biotin 1000 MCG tablet, biotin, Disp: , Rfl:    blood glucose meter kit and supplies KIT, Dispense based on patient and insurance preference. Use up to two times daily as directed. (FOR ICD-9 250.00, 250.01)., Disp: 1 each, Rfl: 0   buPROPion (WELLBUTRIN XL) 150 MG 24 hr tablet, TAKE 1 TABLET(150 MG) BY MOUTH EVERY MORNING, Disp: 90 tablet, Rfl: 1   carvedilol (COREG) 6.25 MG tablet, Please take 1.5 tablets twice daily, Disp: 120 tablet, Rfl: 3   Cholecalciferol (VITAMIN D3) 125 MCG (5000 UT) CAPS, Take 1 capsule by mouth daily at  12 noon., Disp: , Rfl:    clobetasol (TEMOVATE) 0.05 % external solution, SMARTSIG:1 Sparingly Topical Twice Daily, Disp: , Rfl:    Continuous Blood Gluc Sensor (FREESTYLE LIBRE 14 DAY SENSOR) MISC, Use as directed to check blood sugars 2 times per day dx: e11.9, Disp: 2 each, Rfl: 1   Garlic 5638 MG CAPS, Take 1 capsule by mouth daily., Disp: , Rfl:    glucose blood (ACCU-CHEK SMARTVIEW) test strip, Use to check blood sugars twice daily E11.69, Disp: 100 each, Rfl: 2   metFORMIN (GLUCOPHAGE-XR) 500 MG 24 hr tablet, Take 1 tablet (500 mg total) by mouth 2 (two) times daily., Disp: 180 tablet, Rfl: 1   Multiple Vitamin (MULTIVITAMIN) capsule, Take 1 capsule by mouth daily., Disp: , Rfl:    rosuvastatin (CRESTOR) 20 MG tablet, TAKE 1 TABLET(20 MG) BY MOUTH DAILY, Disp: 90 tablet, Rfl: 1   spironolactone (ALDACTONE) 50 MG tablet, Take  50 mg by mouth daily., Disp: , Rfl:    valsartan-hydrochlorothiazide (DIOVAN-HCT) 160-25 MG tablet, Take 1 tablet by mouth daily., Disp: 90 tablet, Rfl: 1  Current Facility-Administered Medications:    0.9 %  sodium chloride infusion, 500 mL, Intravenous, Once, Armbruster, Carlota Raspberry, MD   Allergies  Allergen Reactions   Ace Inhibitors Cough   Atorvastatin Other (See Comments)    Leg pain    Benadryl [Diphenhydramine] Itching    Benadryl cream   Peppermint Flavor [Flavoring Agent]     Causes horsenss      The patient states she uses post menopausal status for birth control. Last LMP was No LMP recorded. Patient is postmenopausal.. Negative for Dysmenorrhea and Negative for Menorrhagia. Negative for: breast discharge, breast lump(s), breast pain and breast self exam. Associated symptoms include abnormal vaginal bleeding. Pertinent negatives include abnormal bleeding (hematology), anxiety, decreased libido, depression, difficulty falling sleep, dyspareunia, history of infertility, nocturia, sexual dysfunction, sleep disturbances, urinary incontinence, urinary urgency, vaginal discharge and vaginal itching. Diet regular. The patient states her exercise level is minimal recently. [facility]   The patient's tobacco use is:  Social History   Tobacco Use  Smoking Status Never  Smokeless Tobacco Never  . She has been exposed to passive smoke. The patient's alcohol use is:  Social History   Substance and Sexual Activity  Alcohol Use Never    Review of Systems  Constitutional: Negative.   HENT: Negative.    Eyes: Negative.   Respiratory: Negative.    Cardiovascular: Negative.  Negative for chest pain.  Gastrointestinal: Negative.  Negative for abdominal pain.  Endocrine: Negative.   Genitourinary: Negative.   Musculoskeletal: Negative.   Skin: Negative.   Allergic/Immunologic: Negative.   Neurological: Negative.   Hematological: Negative.    Psychiatric/Behavioral: Negative.       Today's Vitals   01/06/22 0942  BP: 128/74  Pulse: (!) 59  Temp: 97.8 F (36.6 C)  TempSrc: Oral  Weight: 183 lb (83 kg)  Height: '5\' 5"'  (1.651 m)   Body mass index is 30.45 kg/m.  Wt Readings from Last 3 Encounters:  01/06/22 183 lb (83 kg)  12/04/21 184 lb 9.6 oz (83.7 kg)  11/13/21 188 lb 9.6 oz (85.5 kg)    Objective:  Physical Exam Vitals reviewed.  Constitutional:      General: She is not in acute distress.    Appearance: Normal appearance. She is well-developed. She is obese.  HENT:     Head: Normocephalic and atraumatic.  Right Ear: Hearing, tympanic membrane, ear canal and external ear normal. There is no impacted cerumen.     Left Ear: Hearing, tympanic membrane, ear canal and external ear normal. There is no impacted cerumen.     Nose:     Comments: Deferred - masked    Mouth/Throat:     Comments: Deferred - masked Eyes:     General: Lids are normal.     Extraocular Movements: Extraocular movements intact.     Conjunctiva/sclera: Conjunctivae normal.     Pupils: Pupils are equal, round, and reactive to light.     Funduscopic exam:    Right eye: No papilledema.        Left eye: No papilledema.  Neck:     Thyroid: No thyroid mass.     Vascular: No carotid bruit.  Cardiovascular:     Rate and Rhythm: Normal rate and regular rhythm.     Pulses: Normal pulses.     Heart sounds: Normal heart sounds. No murmur heard. Pulmonary:     Effort: Pulmonary effort is normal.     Breath sounds: Normal breath sounds.  Chest:     Chest wall: No mass.  Breasts:    Tanner Score is 5.     Right: Normal. No mass or tenderness.     Left: Normal. No mass or tenderness.  Abdominal:     General: Abdomen is flat. Bowel sounds are normal. There is no distension.     Palpations: Abdomen is soft.     Tenderness: There is no abdominal tenderness.  Musculoskeletal:        General: No swelling. Normal range of motion.     Cervical  back: Full passive range of motion without pain, normal range of motion and neck supple.     Right lower leg: No edema.     Left lower leg: No edema.  Lymphadenopathy:     Upper Body:     Right upper body: No supraclavicular, axillary or pectoral adenopathy.     Left upper body: No supraclavicular, axillary or pectoral adenopathy.  Skin:    General: Skin is warm and dry.     Capillary Refill: Capillary refill takes less than 2 seconds.  Neurological:     General: No focal deficit present.     Mental Status: She is alert and oriented to person, place, and time.     Cranial Nerves: No cranial nerve deficit.     Sensory: No sensory deficit.  Psychiatric:        Mood and Affect: Mood normal.        Behavior: Behavior normal.        Thought Content: Thought content normal.        Judgment: Judgment normal.         Assessment And Plan:     1. Routine medical exam  2. Type 2 diabetes mellitus with hyperlipidemia ([place])  3. Essential hypertension - POCT Urinalysis Dipstick (81002) - Microalbumin / Creatinine Urine Ratio  4. Mixed hyperlipidemia  5. Class 1 obesity due to excess calories with serious comorbidity and body mass index (BMI) of 30.0 to 30.9 in adult She is encouraged to strive for BMI less than 30 to decrease cardiac risk. Advised to aim for at least 150 minutes of exercise per week..     Patient was given opportunity to ask questions. Patient verbalized understanding of the plan and was able to repeat key elements of the plan. All questions were answered to  their satisfaction.   Jasmine Brine, FNP   I, Jasmine Brine, FNP, have reviewed all documentation for this visit. The documentation on 01/06/22 for the exam, diagnosis, procedures, and orders are all accurate and complete.   THE PATIENT IS ENCOURAGED TO PRACTICE SOCIAL DISTANCING DUE TO THE COVID-19 PANDEMIC.

## 2022-01-06 NOTE — Patient Instructions (Signed)

## 2022-01-07 LAB — MICROALBUMIN / CREATININE URINE RATIO
Creatinine, Urine: 44.2 mg/dL
Microalb/Creat Ratio: <7 mg/g creat (ref 0–29)
Microalbumin, Urine: <3 ug/mL

## 2022-01-07 NOTE — Progress Notes (Signed)
This encounter was created in error - please disregard.

## 2022-01-08 ENCOUNTER — Encounter: Payer: Self-pay | Admitting: Nurse Practitioner

## 2022-01-10 ENCOUNTER — Telehealth: Payer: Self-pay | Admitting: Hematology and Oncology

## 2022-01-10 NOTE — Telephone Encounter (Signed)
Scheduled appt per 8/28 referral. Pt is aware of appt date and time. Pt is aware to arrive 15 mins prior to appt time and to bring and updated insurance card. Pt is aware of appt location.   

## 2022-01-24 ENCOUNTER — Ambulatory Visit: Payer: Medicare PPO

## 2022-01-24 DIAGNOSIS — I10 Essential (primary) hypertension: Secondary | ICD-10-CM | POA: Diagnosis not present

## 2022-01-24 DIAGNOSIS — Z0000 Encounter for general adult medical examination without abnormal findings: Secondary | ICD-10-CM

## 2022-01-24 NOTE — Progress Notes (Signed)
Appointment Outcome: Completed, Session #: [id number]/u                        Start time: 2:33pm   End time: 3:06pm   Total Mins: 33 minutes  AGREEMENTS SECTION   Overall Goal(s): Stress management Improve healthy eating habits Reduce sodium consumption Lower A1C and LDL   Agreement/Action Steps:  Reduce sodium consumption/lower A1C/lower LDL Read food labels to monitor sodium and sugar intake  Consume 64 oz of fluid Drink 1-8 oz cups of tea daily Drink 3-16 oz bottles of water daily Meal plan/prep for the week to deter eating fast-food Bake food instead of frying Set boundaries around eating out with family and friends   Stress Management: Utilize support system  Conduct gratitude reflections (3 things grateful for and/or 3 things was successful at doing) Set an alarm for 5:30pm to start writing for 10-15 minutes  Conduct self-check-ins Incorporate positive self-talk Engage in church activities [facility] for 1 hour each class   Progress Notes:  Patient rated her stress level at a 6.5/10. Patient stated that she was at a 10 a while back. Patient expressed that she is a little stressed at this time and this happens when little things come up. Patient shared that with support and continuing to implement her action steps to manage her stress has helped her get her stress back down.   Patient mentioned that her son has family dinner on Sunday's, and they do a group check-in on their mental health. Patient stated that this helps her check-in with herself a little deeper. Patient stated that she continues to conduct self-check-ins and implement positive self-talk to help keep calm and stay on track. Patient tells herself "I can handle it," and "check on yourself." Patient shared that she continues to conduct verbal gratitude reflections at 5:30pm daily since she is not by her desk most times to write. Patient stated that having this time set  aside by alarm, she is able to get to a stopping point for the day reflect and start her wind down activities for the evening.   Patient shared that having a schedule that includes exercising and engaging church activities help keep her busy. Patient mentioned that when she is not organized with a schedule, she will ask herself what's happening because she finds herself getting down and having time to think. Patient shared that staying busy doesn't give her time to get down because she is occupied and focused on other things.  [facility]. Patient stated that she will attend another class on either Friday or Saturday to exercise twice weekly at the Center. Patient stated that she over plan for 4 days of exercise and her aim is 3 days per week. Patient shared that the 3rd day of [facility] [place] Patient mentioned that she participates in the food distribution at church on 2nd and 4th Tuesdays of every month.   Patient stated that her main challenges with improving her eating behaviors are maintaining drinking 3 full 16.9 oz bottles of water daily and not eating fried chicken when she is away from home. Patient shared that at home she has no trouble avoiding frying food because she bakes or boils her meats. Patient mentioned that she takes the skin off her chicken breast before eating it when they have fried  chicken at church. Patient reported that she is enforcing her boundaries with family and friends when she is out eating. Patient shared that she stopped drinking soda and have to remind people when she eats out that although her meal comes with a drink she is sticking to drinking water.   Patient stated that she continues to drink her tea daily and finds herself drinking all 3 bottles of water 3-4 days per week. Patient stated that when she realizes that she has 1 bottle of water left to drink she  tries to drink all or majority of it no later than 8pm. Patient shared that if she is not able to do so, she will make up for it the next day, but she tries not to exceed 64 oz of plain water. Patient shared that she believes she drinks about 54 oz of plain water, but also drink lemonade or juice.   Patient shared that she is reading food labels all the time now that she can see the writing after visiting the eye doctor. Patient mentioned that reading the labels does help her monitor her sugar and sodium intake more closely. Patient stated that she typically has dinner around 6pm and stop eating snacks and drinking anything other than water if she is catching up. Patient shared that sometimes she gets sidetracked with drinking her water during the day if she is meal planning/prepping. Patient stated that this step has kept her out of the drive through line. Patient stated that sometimes she goes to [employer] to get vegetables. Patient reported that if she does get a meat then it is also baked or boiled.   Patient had previous lab work for A1C and Lipids. Patient has an elevated LDL and a slight improvement in her A1C per provider.   Indicators of Success and Accountability:  Patient has been able to manage her stress with her action steps along with the support from her family and friends.  Readiness: Patient is in the maintenance stages of stress management. Patient is in the action stage of improving healthy eating behaviors.  Strengths and Supports: Patient is being supported by her family and friends. Patient has become more independent in her thinking that allowed her to make changes in her health behaviors. Patient also relied on her faith.  Challenges and Barriers: Patient is challenged with remembering to drink 3 bottles of water per day in addition to her other drinks.    Coaching Outcomes:  Patient is repeating labs today and will speak to her provider about next steps and if that  includes specific dietary changes. Patient will be referred to me if she has specific eating behaviors, she needs to improve to reduce her LDL.   Patient will work to reduce other fluid intake during day to increase her ability to drink 3 full 16.9 oz bottles of water per day.  Patient will continue to implement action steps as outlined above unless health coaching is resumed after speaking with Dr. Oval Linsey or [place].    Attempted: Fulfilled - Patient is reading food labels, drinking 1 cup of tea, meal planning/prepping, baking food, and setting boundaries around her healthy eating habits with family and friends. Patient is implementing all her action steps to manage stress. Partial - Patient is drinking 3 full bottles of water approximately 3-4 days per week.

## 2022-01-25 LAB — THYROID PANEL WITH TSH
Free Thyroxine Index: 1.7 (ref 1.2–4.9)
T3 Uptake Ratio: 25 % (ref 24–39)
T4, Total: 6.6 ug/dL (ref 4.5–12.0)
TSH: 1.37 u[IU]/mL (ref 0.450–4.500)

## 2022-01-27 ENCOUNTER — Ambulatory Visit: Payer: Self-pay

## 2022-01-27 NOTE — Patient Outreach (Signed)
  Care Coordination   Follow Up Visit Note   01/27/2022 Name: Jasmine Lopez MRN: [id number] DOB: 19-Dec-1940  Jasmine Lopez is a 81 y.o. year old female who sees [name], [place] for primary care. I spoke with  Cheryl Flash by phone today.  What matters to the patients health and wellness today?  Patient would like a call back to discuss her health concerns.     Goals Addressed             This Visit's Progress    Care Coordination Activities - further follow up required       Care Coordination Interventions: Determined patient would like to participate in the [facility] CC program, however she is traveling today and would like a call back Scheduled a return call to patient at her preferred time/date         SDOH assessments and interventions completed:  No     Care Coordination Interventions Activated:  Yes  Care Coordination Interventions:  Yes, provided   Follow up plan: Follow up call scheduled for 02/03/22 '@2'$ :45 PM     Encounter Outcome:  Pt. Visit Completed

## 2022-01-27 NOTE — Patient Instructions (Signed)
Visit Information  Thank you for taking time to visit with me today. Please don't hesitate to contact me if I can be of assistance to you.   Following are the goals we discussed today:   Goals Addressed             This Visit's Progress    Care Coordination Activities - further follow up required       Care Coordination Interventions: Determined patient would like to participate in the [facility] CC program, however she is traveling today and would like a call back Scheduled a return call to patient at her preferred time/date         Our next appointment is by telephone on 02/03/22 at 2:45 PM  Please call the care guide team at [phone] if you need to cancel or reschedule your appointment.   If you are experiencing a Mental Health or [place] or need someone to talk to, please call [phone]-TALK (toll free, 24 hour hotline)  Patient verbalizes understanding of instructions and care plan provided today and agrees to view in [place]. Active MyChart status and patient understanding of how to access instructions and care plan via MyChart confirmed with patient.     Barb Merino, RN, BSN, CCM Care Management Coordinator [facility] Direct Phone: [phone]

## 2022-01-28 ENCOUNTER — Telehealth (HOSPITAL_BASED_OUTPATIENT_CLINIC_OR_DEPARTMENT_OTHER): Payer: Self-pay

## 2022-01-28 NOTE — Telephone Encounter (Addendum)
Called results to patient and left results on VM (ok per DPR), instructions left to call office back if patient has any questions!     ----- Message from Loel Dubonnet, NP sent at 01/27/2022  7:46 AM EDT ----- Normal thyroid function. Good result!

## 2022-01-29 ENCOUNTER — Encounter: Payer: Self-pay | Admitting: Licensed Clinical Social Worker

## 2022-02-03 ENCOUNTER — Ambulatory Visit: Payer: Self-pay

## 2022-02-03 ENCOUNTER — Other Ambulatory Visit: Payer: Self-pay | Admitting: Nurse Practitioner

## 2022-02-03 DIAGNOSIS — E6609 Other obesity due to excess calories: Secondary | ICD-10-CM

## 2022-02-03 DIAGNOSIS — E1169 Type 2 diabetes mellitus with other specified complication: Secondary | ICD-10-CM

## 2022-02-03 DIAGNOSIS — I10 Essential (primary) hypertension: Secondary | ICD-10-CM

## 2022-02-03 NOTE — Patient Instructions (Signed)
Visit Information  Thank you for taking time to visit with me today. Please don't hesitate to contact me if I can be of assistance to you.   Following are the goals we discussed today:   Goals Addressed               This Visit's Progress     Patient Stated     I would like to stay physically active and control my diabetes (pt-stated)        Care Coordination Interventions: Provided education to patient about basic DM disease process Reviewed medications with patient and discussed importance of medication adherence Advised patient, providing education and rationale, to check cbg before meals twice daily and record, calling PCP for findings outside established parameters Review of patient status, including review of consultants reports, relevant laboratory and other test results, and medications completed Educated patient while providing rationale regarding target A1c <7.0% Educated patient regarding benefits of meal planning and using portion control; making healthy choices when dining out  Educated patient regarding proper use and benefits related to use of the [place] 3 sensor  Educated patient about the provider exercise referral program, sent in basket message to PCP requesting referral          Other     COMPLETED: Care Coordination Activities - further follow up required        Care Coordination Interventions: Determined patient would like to participate in the [place] program, however she is traveling today and would like a call back Scheduled a return call to patient at her preferred time/date         Our next appointment is by telephone on 03/11/22 at 10:30 AM  Please call the care guide team at [phone] if you need to cancel or reschedule your appointment.   If you are experiencing a Mental Health or [place] or need someone to talk to, please call 1-800-273-TALK (toll free, 24 hour hotline)  Patient verbalizes understanding of instructions  and care plan provided today and agrees to view in [place]. Active MyChart status and patient understanding of how to access instructions and care plan via MyChart confirmed with patient.     Barb Merino, RN, BSN, CCM Care Management Coordinator [place] Management Direct Phone: [phone]

## 2022-02-03 NOTE — Patient Outreach (Signed)
  Care Coordination   Follow Up Visit Note   02/03/2022 Name: RACHELANN ENLOE MRN: [id number] DOB: [birth year]  MADDELYNN MOOSMAN is a 81 y.o. year old female who sees [name], [place] for primary care. I spoke with  Cheryl Flash by phone today.  What matters to the patients health and wellness today?  Patient would like to stay physically active and keep diabetes under better control.     Goals Addressed               This Visit's Progress     Patient Stated     I would like to stay physically active and control my diabetes (pt-stated)        Care Coordination Interventions: Provided education to patient about basic DM disease process Reviewed medications with patient and discussed importance of medication adherence Advised patient, providing education and rationale, to check cbg before meals twice daily and record, calling PCP for findings outside established parameters Review of patient status, including review of consultants reports, relevant laboratory and other test results, and medications completed Educated patient while providing rationale regarding target A1c <7.0% Educated patient regarding benefits of meal planning and using portion control; making healthy choices when dining out  Educated patient regarding proper use and benefits related to use of the [place] 3 sensor  Educated patient about the provider exercise referral program, sent in basket message to PCP requesting referral          Other     COMPLETED: Care Coordination Activities - further follow up required        Care Coordination Interventions: Determined patient would like to participate in the [place] program, however she is traveling today and would like a call back Scheduled a return call to patient at her preferred time/date         SDOH assessments and interventions completed:  No     Care Coordination Interventions Activated:  Yes  Care Coordination Interventions:  Yes, provided    Follow up plan: Follow up call scheduled for 03/11/22 '@10'$ :30 AM     Encounter Outcome:  Pt. Visit Completed

## 2022-02-04 ENCOUNTER — Encounter: Payer: Self-pay | Admitting: Hematology and Oncology

## 2022-02-04 ENCOUNTER — Inpatient Hospital Stay: Payer: Medicare PPO | Attending: Hematology and Oncology | Admitting: Hematology and Oncology

## 2022-02-04 ENCOUNTER — Other Ambulatory Visit: Payer: Self-pay

## 2022-02-04 ENCOUNTER — Telehealth: Payer: Self-pay | Admitting: Podiatry

## 2022-02-04 ENCOUNTER — Inpatient Hospital Stay: Payer: Medicare PPO

## 2022-02-04 DIAGNOSIS — I10 Essential (primary) hypertension: Secondary | ICD-10-CM | POA: Insufficient documentation

## 2022-02-04 DIAGNOSIS — G4730 Sleep apnea, unspecified: Secondary | ICD-10-CM | POA: Diagnosis not present

## 2022-02-04 DIAGNOSIS — Z7982 Long term (current) use of aspirin: Secondary | ICD-10-CM | POA: Diagnosis not present

## 2022-02-04 DIAGNOSIS — Z7984 Long term (current) use of oral hypoglycemic drugs: Secondary | ICD-10-CM | POA: Diagnosis not present

## 2022-02-04 DIAGNOSIS — Z79899 Other long term (current) drug therapy: Secondary | ICD-10-CM | POA: Insufficient documentation

## 2022-02-04 DIAGNOSIS — E119 Type 2 diabetes mellitus without complications: Secondary | ICD-10-CM | POA: Insufficient documentation

## 2022-02-04 DIAGNOSIS — D0512 Intraductal carcinoma in situ of left breast: Secondary | ICD-10-CM | POA: Insufficient documentation

## 2022-02-04 NOTE — Telephone Encounter (Signed)
Patient is very anxious about not getting her diabetic shoes, she said she hand delivered the paperwork and its been 7 months since she started this process to get her diabetic shoes.  Please call

## 2022-02-04 NOTE — Progress Notes (Signed)
[place] NOTE  Patient Care Team: [name], [place] as PCP - General ([place]) Skeet Latch, MD as PCP - Cardiology (Cardiology) Truman Hayward, Amy Little, Claudette Stapler, RN as [place] Management  CHIEF COMPLAINTS/PURPOSE OF CONSULTATION:  Newly diagnosed breast cancer  HISTORY OF PRESENTING ILLNESS:   Jasmine Lopez 81 y.o. female is here because of recent diagnosis of left breast DCIS. This is a very pleasant 81 year old female patient with history of left breast DCIS status postlumpectomy, adjuvant antiestrogen therapy for 4 years now referred to medical oncology for follow-up.  Since her last visit with oncology back in 2018 or 2019, she moved to [facility] and has not established with medical oncology.  She recently was told by her niece who is a physical therapist that she may benefit from going back to medical oncology has requested a referral.  She feels quite well.  She is recovering from her grief, has ongoing diabetes and hypertension for which she is working on.  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  She had her mammogram recently which was unremarkable.  She denies any new health complaints.  Rest of the pertinent 10 point ROS reviewed and negative  MEDICAL HISTORY:  Past Medical History:  Diagnosis Date   Anxiety    Breast cancer ([place])    Cancer ([place])    Diabetes mellitus without complication ([place])    Dysphagia    GERD (gastroesophageal reflux disease) 10/12/2020   Hair loss 10/12/2020   Heart murmur    Hx of adenomatous polyp of colon 06/2015   [name]- [facility]   Hypertension    Irregular heart beat    Sleep apnea     SURGICAL HISTORY: Past Surgical History:  Procedure Laterality Date   BREAST EXCISIONAL BIOPSY Bilateral    BREAST LUMPECTOMY Left 2016    SOCIAL HISTORY: Social History   Socioeconomic History   Marital status: Widowed    Spouse name:  Not on file   Number of children: Not on file   Years of education: Not on file   Highest education level: Not on file  Occupational History   Occupation: retired  Tobacco Use   Smoking status: Never   Smokeless tobacco: Never  Vaping Use   Vaping Use: Never used  Substance and Sexual Activity   Alcohol use: Never   Drug use: Never   Sexual activity: Not Currently  Other Topics Concern   Not on file  Social History Narrative   Lives home alone.  Widowed.  Retired.  MS counseling education. 4 cups coffee/ wk.     Social Determinants of Health   Financial Resource Strain: Low Risk  (12/04/2021)   Overall Financial Resource Strain (CARDIA)    Difficulty of Paying Living Expenses: Not hard at all  Food Insecurity: No Food Insecurity (12/04/2021)   Hunger Vital Sign    Worried About Running Out of Food in the Last Year: Never true    Ran Out of Food in the Last Year: Never true  Transportation Needs: No Transportation Needs (12/04/2021)   PRAPARE - Hydrologist (Medical): No    Lack of Transportation (Non-Medical): No  Physical Activity: Insufficiently Active (12/04/2021)   Exercise Vital Sign    Days of Exercise per Week: 2 days    Minutes of Exercise per Session: 60 min  Stress: No Stress Concern Present (12/04/2021)   [employer]  of Occupational Health - Occupational Stress Questionnaire    Feeling of Stress : Not at all  Social Connections: Not on file  Intimate Partner Violence: Not At Risk (10/14/2018)   Humiliation, Afraid, Rape, and Kick questionnaire    Fear of Current or Ex-Partner: No    Emotionally Abused: No    Physically Abused: No    Sexually Abused: No    FAMILY HISTORY: Family History  Problem Relation Age of Onset   Heart failure Mother        CAD   COPD Mother    Hypertension Mother    Cancer Father    Rectal cancer Maternal Grandmother        Unsure   Colon cancer Maternal Grandmother    Heart attack Maternal  Grandfather    Esophageal cancer Neg Hx    Stomach cancer Neg Hx     ALLERGIES:  is allergic to ace inhibitors, atorvastatin, benadryl [diphenhydramine], and peppermint flavor [flavoring agent].  MEDICATIONS:  Current Outpatient Medications  Medication Sig Dispense Refill   Accu-Chek Softclix Lancets lancets Use to check blood sugars twice daily E11.69 100 each 2   acetaminophen (TYLENOL) 500 MG tablet Take 500 mg by mouth every 6 (six) hours as needed.     allopurinol (ZYLOPRIM) 100 MG tablet Take 1 tablet (100 mg total) by mouth daily. 90 tablet 0   amLODipine (NORVASC) 10 MG tablet TAKE 1 TABLET(10 MG) BY MOUTH DAILY 90 tablet 2   aspirin EC 81 MG tablet Take 1 tablet (81 mg total) by mouth daily. Swallow whole. 90 tablet 3   Biotin 1000 MCG tablet biotin     blood glucose meter kit and supplies KIT Dispense based on patient and insurance preference. Use up to two times daily as directed. (FOR ICD-9 250.00, 250.01). 1 each 0   buPROPion (WELLBUTRIN XL) 150 MG 24 hr tablet TAKE 1 TABLET(150 MG) BY MOUTH EVERY MORNING 90 tablet 1   carvedilol (COREG) 6.25 MG tablet Please take 1.5 tablets twice daily 120 tablet 3   Cholecalciferol (VITAMIN D3) 125 MCG (5000 UT) CAPS Take 1 capsule by mouth daily at 12 noon.     clobetasol (TEMOVATE) 0.05 % external solution SMARTSIG:1 Sparingly Topical Twice Daily     Continuous Blood Gluc Sensor (FREESTYLE LIBRE 14 DAY SENSOR) MISC Use as directed to check blood sugars 2 times per day dx: N46.2 2 each 1   Garlic 7035 MG CAPS Take 1 capsule by mouth daily.     glucose blood (ACCU-CHEK SMARTVIEW) test strip Use to check blood sugars twice daily E11.69 100 each 2   metFORMIN (GLUCOPHAGE-XR) 500 MG 24 hr tablet Take 1 tablet (500 mg total) by mouth 2 (two) times daily. 180 tablet 1   Multiple Vitamin (MULTIVITAMIN) capsule Take 1 capsule by mouth daily.     rosuvastatin (CRESTOR) 20 MG tablet TAKE 1 TABLET(20 MG) BY MOUTH DAILY 90 tablet 1   spironolactone  (ALDACTONE) 50 MG tablet Take 50 mg by mouth daily.     valsartan-hydrochlorothiazide (DIOVAN-HCT) 160-25 MG tablet Take 1 tablet by mouth daily. 90 tablet 1   Current Facility-Administered Medications  Medication Dose Route Frequency Provider Last Rate Last Admin   0.9 %  sodium chloride infusion  500 mL Intravenous Once Armbruster, Carlota Raspberry, MD        REVIEW OF SYSTEMS:   Constitutional: Denies fevers, chills or abnormal night sweats Eyes: Denies blurriness of vision, double vision or watery eyes Ears, nose, mouth, throat,  and face: Denies mucositis or sore throat Respiratory: Denies cough, dyspnea or wheezes Cardiovascular: Denies palpitation, chest discomfort or lower extremity swelling Gastrointestinal:  Denies nausea, heartburn or change in bowel habits Skin: Denies abnormal skin rashes Lymphatics: Denies new lymphadenopathy or easy bruising Neurological:Denies numbness, tingling or new weaknesses Behavioral/Psych: Mood is stable, no new changes  Breast: Denies any palpable lumps or discharge All other systems were reviewed with the patient and are negative.  PHYSICAL EXAMINATION: ECOG PERFORMANCE STATUS: 0 - Asymptomatic  Vitals:   02/04/22 1016  BP: (!) 153/74  Pulse: 60  Resp: 18  Temp: 97.9 F (36.6 C)  SpO2: 100%   Filed Weights   02/04/22 1016  Weight: 189 lb 3.2 oz (85.8 kg)    GENERAL:alert, no distress and comfortable SKIN: skin color, texture, turgor are normal, no rashes or significant lesions EYES: normal, conjunctiva are pink and non-injected, sclera clear OROPHARYNX:no exudate, no erythema and lips, buccal mucosa, and tongue normal  NECK: supple, thyroid normal size, non-tender, without nodularity LYMPH:  no palpable lymphadenopathy in the cervical, axillary or inguinal LUNGS: clear to auscultation and percussion with normal breathing effort HEART: regular rate & rhythm and no murmurs and no lower extremity edema ABDOMEN:abdomen soft, non-tender and  normal bowel sounds Musculoskeletal:no cyanosis of digits and no clubbing  PSYCH: alert & oriented x 3 with fluent speech NEURO: no focal motor/sensory deficits BREAST:  Bilateral breasts inspected. No palpable masses or regional adenopathy.  LABORATORY DATA:  I have reviewed the data as listed Lab Results  Component Value Date   WBC 4.4 11/05/2021   HGB 13.4 11/05/2021   HCT 37.7 11/05/2021   MCV 87 11/05/2021   PLT 217 11/05/2021   Lab Results  Component Value Date   NA 138 11/05/2021   K 4.4 11/05/2021   CL 102 11/05/2021   CO2 23 11/05/2021    RADIOGRAPHIC STUDIES: I have personally reviewed the radiological reports and agreed with the findings in the report.  ASSESSMENT AND PLAN:  Ductal carcinoma in situ (DCIS) of left breast This is a very pleasant 81 year old female patient with past medical history significant for diabetes, hypertension, sleep apnea, left breast DCIS status postsurgery and adjuvant antiestrogen therapy for almost 5 years referred to medical oncology for history of DCIS.  Patient arrived to the appointment today by herself.  According to her, she was diagnosed after an abnormal mammogram with left breast DCIS and was recommended lumpectomy.  After that she was prescribed anastrozole which she took for 4 years.  Around that time when her husband died, she kind of fell off the wagon and stopped taking several of her medications.  She since then has been doing better, now moved to [place] to be closer to her family. Her last mammogram was unremarkable, no evidence of malignancy.  Physical examination today unremarkable, no palpable breast masses or regional adenopathy.  Since it has been more than 5 years from her diagnosis of DCIS, we have discussed about continuing annual screening mammograms and follow-up with PCP and return to medical oncology as needed.  She is okay with this plan.  Total time spent: 45 minutes including history, physical exam, review of  records, counseling and coordination of care All questions were answered. The patient knows to call the clinic with any problems, questions or concerns.    Benay Pike, MD 02/04/22

## 2022-02-04 NOTE — Assessment & Plan Note (Signed)
This is a very pleasant 81 year old female patient with past medical history significant for diabetes, hypertension, sleep apnea, left breast DCIS status postsurgery and adjuvant antiestrogen therapy for almost 5 years referred to medical oncology for history of DCIS.  Patient arrived to the appointment today by herself.  According to her, she was diagnosed after an abnormal mammogram with left breast DCIS and was recommended lumpectomy.  After that she was prescribed anastrozole which she took for 4 years.  Around that time when her husband died, she kind of fell off the wagon and stopped taking several of her medications.  She since then has been doing better, now moved to [place] to be closer to her family. Her last mammogram was unremarkable, no evidence of malignancy.  Physical examination today unremarkable, no palpable breast masses or regional adenopathy.  Since it has been more than 5 years from her diagnosis of DCIS, we have discussed about continuing annual screening mammograms and follow-up with PCP and return to medical oncology as needed.  She is okay with this plan.

## 2022-02-10 ENCOUNTER — Other Ambulatory Visit: Payer: Self-pay | Admitting: Nurse Practitioner

## 2022-02-10 DIAGNOSIS — Z1231 Encounter for screening mammogram for malignant neoplasm of breast: Secondary | ICD-10-CM

## 2022-02-12 DIAGNOSIS — M9902 Segmental and somatic dysfunction of thoracic region: Secondary | ICD-10-CM | POA: Diagnosis not present

## 2022-02-12 DIAGNOSIS — M47814 Spondylosis without myelopathy or radiculopathy, thoracic region: Secondary | ICD-10-CM | POA: Diagnosis not present

## 2022-02-12 DIAGNOSIS — M5136 Other intervertebral disc degeneration, lumbar region: Secondary | ICD-10-CM | POA: Diagnosis not present

## 2022-02-12 DIAGNOSIS — M9903 Segmental and somatic dysfunction of lumbar region: Secondary | ICD-10-CM | POA: Diagnosis not present

## 2022-02-12 DIAGNOSIS — M9901 Segmental and somatic dysfunction of cervical region: Secondary | ICD-10-CM | POA: Diagnosis not present

## 2022-02-12 DIAGNOSIS — M5032 Other cervical disc degeneration, mid-cervical region, unspecified level: Secondary | ICD-10-CM | POA: Diagnosis not present

## 2022-02-13 NOTE — Telephone Encounter (Signed)
Patient called - states someone at the front desk told her she could come get her shoes  ?  Darrick Meigs said they are ready for pickup.

## 2022-02-14 ENCOUNTER — Telehealth: Payer: Self-pay

## 2022-02-14 NOTE — Telephone Encounter (Signed)
VMT pt requesting call back reference PREP referral.   

## 2022-02-17 ENCOUNTER — Telehealth: Payer: Self-pay

## 2022-02-17 NOTE — Telephone Encounter (Signed)
Call from pt reference PREP Agrees to start 10/30 MW 230p-345p at [facility]  Intake scheduled for 10/23 at 3pm

## 2022-02-18 DIAGNOSIS — M5032 Other cervical disc degeneration, mid-cervical region, unspecified level: Secondary | ICD-10-CM | POA: Diagnosis not present

## 2022-02-18 DIAGNOSIS — M9903 Segmental and somatic dysfunction of lumbar region: Secondary | ICD-10-CM | POA: Diagnosis not present

## 2022-02-18 DIAGNOSIS — M9901 Segmental and somatic dysfunction of cervical region: Secondary | ICD-10-CM | POA: Diagnosis not present

## 2022-02-18 DIAGNOSIS — M47814 Spondylosis without myelopathy or radiculopathy, thoracic region: Secondary | ICD-10-CM | POA: Diagnosis not present

## 2022-02-18 DIAGNOSIS — M5136 Other intervertebral disc degeneration, lumbar region: Secondary | ICD-10-CM | POA: Diagnosis not present

## 2022-02-18 DIAGNOSIS — M9902 Segmental and somatic dysfunction of thoracic region: Secondary | ICD-10-CM | POA: Diagnosis not present

## 2022-02-19 ENCOUNTER — Ambulatory Visit (INDEPENDENT_AMBULATORY_CARE_PROVIDER_SITE_OTHER): Payer: Medicare PPO | Admitting: Podiatry

## 2022-02-19 DIAGNOSIS — E785 Hyperlipidemia, unspecified: Secondary | ICD-10-CM

## 2022-02-19 DIAGNOSIS — E1169 Type 2 diabetes mellitus with other specified complication: Secondary | ICD-10-CM

## 2022-02-19 NOTE — Progress Notes (Signed)
Patient presents today to pick up diabetic shoes and insoles.  Patient was dispensed 1 pair of diabetic shoes and 3 pairs of foam casted diabetic insoles. She tried on the shoes with the insoles and the fit was not satisfactory.

## 2022-02-21 DIAGNOSIS — M47814 Spondylosis without myelopathy or radiculopathy, thoracic region: Secondary | ICD-10-CM | POA: Diagnosis not present

## 2022-02-21 DIAGNOSIS — M9903 Segmental and somatic dysfunction of lumbar region: Secondary | ICD-10-CM | POA: Diagnosis not present

## 2022-02-21 DIAGNOSIS — M5136 Other intervertebral disc degeneration, lumbar region: Secondary | ICD-10-CM | POA: Diagnosis not present

## 2022-02-21 DIAGNOSIS — M9902 Segmental and somatic dysfunction of thoracic region: Secondary | ICD-10-CM | POA: Diagnosis not present

## 2022-02-21 DIAGNOSIS — M5032 Other cervical disc degeneration, mid-cervical region, unspecified level: Secondary | ICD-10-CM | POA: Diagnosis not present

## 2022-02-21 DIAGNOSIS — M9901 Segmental and somatic dysfunction of cervical region: Secondary | ICD-10-CM | POA: Diagnosis not present

## 2022-02-25 DIAGNOSIS — M9901 Segmental and somatic dysfunction of cervical region: Secondary | ICD-10-CM | POA: Diagnosis not present

## 2022-02-25 DIAGNOSIS — M5032 Other cervical disc degeneration, mid-cervical region, unspecified level: Secondary | ICD-10-CM | POA: Diagnosis not present

## 2022-02-25 DIAGNOSIS — M9902 Segmental and somatic dysfunction of thoracic region: Secondary | ICD-10-CM | POA: Diagnosis not present

## 2022-02-25 DIAGNOSIS — M47814 Spondylosis without myelopathy or radiculopathy, thoracic region: Secondary | ICD-10-CM | POA: Diagnosis not present

## 2022-02-25 DIAGNOSIS — M9903 Segmental and somatic dysfunction of lumbar region: Secondary | ICD-10-CM | POA: Diagnosis not present

## 2022-02-25 DIAGNOSIS — M5136 Other intervertebral disc degeneration, lumbar region: Secondary | ICD-10-CM | POA: Diagnosis not present

## 2022-02-27 DIAGNOSIS — M9902 Segmental and somatic dysfunction of thoracic region: Secondary | ICD-10-CM | POA: Diagnosis not present

## 2022-02-27 DIAGNOSIS — M47814 Spondylosis without myelopathy or radiculopathy, thoracic region: Secondary | ICD-10-CM | POA: Diagnosis not present

## 2022-02-27 DIAGNOSIS — M9903 Segmental and somatic dysfunction of lumbar region: Secondary | ICD-10-CM | POA: Diagnosis not present

## 2022-02-27 DIAGNOSIS — M5136 Other intervertebral disc degeneration, lumbar region: Secondary | ICD-10-CM | POA: Diagnosis not present

## 2022-02-27 DIAGNOSIS — M5032 Other cervical disc degeneration, mid-cervical region, unspecified level: Secondary | ICD-10-CM | POA: Diagnosis not present

## 2022-02-27 DIAGNOSIS — L668 Other cicatricial alopecia: Secondary | ICD-10-CM | POA: Diagnosis not present

## 2022-02-27 DIAGNOSIS — M9901 Segmental and somatic dysfunction of cervical region: Secondary | ICD-10-CM | POA: Diagnosis not present

## 2022-03-04 NOTE — Progress Notes (Signed)
[facility] PREP Evaluation  Patient Details  Name: Jasmine Lopez MRN: [id number] Date of Birth: 03-30-1941 Age: 81 y.o. PCP: [name], FNP  Vitals:   03/04/22 1629  BP: 118/60  Pulse: 74  SpO2: 98%  Weight: 194 lb 12.8 oz (88.4 kg)     [facility] Eval - 03/04/22 1600       [facility] "PREP" Location   [facility] "PREP" Location [facility]      Referral    Referring Provider Moore    Reason for referral Hypertension;High Cholesterol;Inactivity;Obesitity/Overweight;Diabetes    Program Start Date 03/10/22   T/TH 230p-345p x 12wks     Measurement   Waist Circumference 40.5 inches    Hip Circumference 41.5 inches    Body fat 48.8 percent      Information for Trainer   Goals Goal wt 170, get back to eating better and exercising, Hgb a1C under 6    Current Exercise 2 x a week    Orthopedic Concerns bil knees, LBP, Hips sore    Pertinent Medical History HTN, DM, High cholesterol, hx of cancer    Current Barriers none    Restrictions/Precautions Diabetic snack before exercise    Medications that affect exercise Medication causing dizziness/drowsiness      Timed Up and Go (TUGS)   Timed Up and Go Low risk <9 seconds      Mobility and Daily Activities   I find it easy to walk up or down two or more flights of stairs. 3    I have no trouble taking out the trash. 4    I do housework such as vacuuming and dusting on my own without difficulty. 4    I can easily lift a gallon of milk (8lbs). 4    I can easily walk a mile. 1    I have no trouble reaching into high cupboards or reaching down to pick up something from the floor. 4    I do not have trouble doing out-door work such as Armed forces logistics/support/administrative officer, raking leaves, or gardening. 1      Mobility and Daily Activities   I feel younger than my age. 2    I feel independent. 3    I feel energetic. 3    I live an active life.  2    I feel strong. 3    I feel healthy. 3    I feel active as other people my age. 3      How fit and strong are  you.   Fit and Strong Total Score 40            Past Medical History:  Diagnosis Date   Anxiety    Breast cancer ([place])    Cancer ([place])    Diabetes mellitus without complication ([place])    Dysphagia    GERD (gastroesophageal reflux disease) 10/12/2020   Hair loss 10/12/2020   Heart murmur    [facility]omat[facility]   Hypertension    Irregular heart beat    Sleep apnea    Past Surgical History:  Procedure Laterality Date   BREAST EXCISIONAL BIOPSY Bilateral    BREAST LUMPECTOMY Left 2016   Social History   Tobacco Use  Smoking Status Never  Smokeless Tobacco Never    [name] 03/04/2022, 4:34 PM

## 2022-03-05 DIAGNOSIS — M9901 Segmental and somatic dysfunction of cervical region: Secondary | ICD-10-CM | POA: Diagnosis not present

## 2022-03-05 DIAGNOSIS — M47814 Spondylosis without myelopathy or radiculopathy, thoracic region: Secondary | ICD-10-CM | POA: Diagnosis not present

## 2022-03-05 DIAGNOSIS — M5136 Other intervertebral disc degeneration, lumbar region: Secondary | ICD-10-CM | POA: Diagnosis not present

## 2022-03-05 DIAGNOSIS — M9902 Segmental and somatic dysfunction of thoracic region: Secondary | ICD-10-CM | POA: Diagnosis not present

## 2022-03-05 DIAGNOSIS — M9903 Segmental and somatic dysfunction of lumbar region: Secondary | ICD-10-CM | POA: Diagnosis not present

## 2022-03-05 DIAGNOSIS — M5032 Other cervical disc degeneration, mid-cervical region, unspecified level: Secondary | ICD-10-CM | POA: Diagnosis not present

## 2022-03-10 ENCOUNTER — Ambulatory Visit: Payer: Medicare PPO | Admitting: Neurology

## 2022-03-10 ENCOUNTER — Encounter: Payer: Self-pay | Admitting: Neurology

## 2022-03-10 VITALS — BP 129/69 | HR 62 | Ht 65.0 in | Wt 190.0 lb

## 2022-03-10 DIAGNOSIS — G3184 Mild cognitive impairment, so stated: Secondary | ICD-10-CM

## 2022-03-10 NOTE — Progress Notes (Signed)
ASSESSMENT AND PLAN 81 y.o. year old female     Mild cognitive impairment  MOCA 25/30  Overall stable,  Encourage moderate exercise  Return to clinic with nurse practitioner in 1 year     DIAGNOSTIC DATA (LABS, IMAGING, TESTING) - I reviewed patient records, labs, notes, testing and imaging myself where available.   Lab in Sept 2023, normal TSH,  LDL 125, A1 C 7.5, CBC, CMP, creat 0.81.  HISTORY  Jasmine Lopez is a 81 year old female, seen in request by his primary care physician Dr.Rosemont, Jonelle Sidle for evaluation of mild cognitive impairment, she is accompanied by his son at today's clinical visit on May 17, 2019.   Past medical history hypertension,  hyperlipidemia,  diabetes,  left breast calcified mass surgery,   She retired as a Management consultant at age 70, she went through excessive stress in early 2019, her husband, and her mother passed away few months apart, she moved from [place] to [place] to be close to her son, she lives independently, still driving, tends to rely on her GPS, she has good appetite, has trouble sleeping, tends to be sedentary,   She was noted to have gradual onset mild cognitive malfunction around 2016, she has word finding difficulties, sometimes difficulty to carry on her train of thoughts, only has mild memory difficulty noted.  There was no family history of memory loss   Laboratory evaluations in September 2020 showed normal CBC, CMP showed elevated glucose 136, LDL 132, normal thyroid functional test, A1c was 6.9  Se took herself off Wellbutrin, since being back on, feels her focus is better, processing better.    MRI of the brain was unremarkable, B12 was normal. MMSE 26/30 November 06 2020  Suffered syncope episode December 2023, while organizing her home in [place] in hot summer day, does feel woozy prior to passing out, had black eyes  MMSE 29/3228 2023  Update March 10, 2022, She is overall doing well,  Personally  reviewed CT head June 2023, generalized atrophy, mostly frontal, no acute abnormality  MRI of the brain January 2021, mild generalized atrophy small vessel disease no acute abnormality MRI of cervical and thoracic spine , multilevel degenerative changes, no significant canal stenosis, variable degree of foraminal narrowing most noticeable right C4-5, moderate on the left side, severe left C6-7,   REVIEW OF SYSTEMS: Out of a complete 14 system review of symptoms, the patient complains only of the following symptoms, and all other reviewed systems are negative.  See HPI  PHYSICAL EXAM  Vitals:   03/10/22 0857  BP: 129/69  Pulse: 62  Weight: 190 lb (86.2 kg)  Height: 5' 5" (1.651 m)   Body mass index is 31.62 kg/m.  Generalized: Well developed, in no acute distress, 2 black eyes    03/10/2022    9:03 AM  Montreal Cognitive Assessment   Visuospatial/ Executive (0/5) 4  Naming (0/3) 3  Attention: Read list of digits (0/2) 2  Attention: Read list of letters (0/1) 1  Attention: Serial 7 subtraction starting at 100 (0/3) 3  Language: Repeat phrase (0/2) 1  Language : Fluency (0/1) 1  Abstraction (0/2) 2  Delayed Recall (0/5) 2  Orientation (0/6) 6  Total 25     PHYSICAL EXAMNIATION:  Gen: NAD, conversant, well nourised, well groomed                     Cardiovascular: Regular rate rhythm, no peripheral edema, warm, nontender. Eyes: Conjunctivae clear  without exudates or hemorrhage Neck: Supple, no carotid bruits. Pulmonary: Clear to auscultation bilaterally   NEUROLOGICAL EXAM:  MENTAL STATUS: Speech/cognition: Awake, alert oriented to history taking and casual conversation  CRANIAL NERVES: CN II: Visual fields are full to confrontation.  Pupils are round equal and briskly reactive to light. CN III, IV, VI: extraocular movement are normal. No ptosis. CN V: Facial sensation is intact to pinprick in all 3 divisions bilaterally. Corneal responses are intact.  CN VII:  Face is symmetric with normal eye closure and smile. CN VIII: Hearing is normal to casual conversation CN IX, X: Palate elevates symmetrically. Phonation is normal. CN XI: Head turning and shoulder shrug are intact CN XII: Tongue is midline with normal movements and no atrophy.  MOTOR: There is no pronator drift of out-stretched arms. Muscle bulk and tone are normal. Muscle strength is normal.  REFLEXES: Reflexes are 2+ and symmetric at the biceps, triceps, knees, and ankles. Plantar responses are flexor.  SENSORY: Intact to light touch, pinprick, positional and vibratory sensation are intact in fingers and toes.  COORDINATION: Rapid alternating movements and fine finger movements are intact. There is no dysmetria on finger-to-nose and heel-knee-shin.    GAIT/STANCE: Posture is normal. Gait is steady with normal steps, base, arm swing, and turning. Heel and toe walking are normal. Tandem gait is normal.  Romberg is absent.    ALLERGIES: Allergies  Allergen Reactions   Ace Inhibitors Cough   Atorvastatin Other (See Comments)    Leg pain    Benadryl [Diphenhydramine] Itching    Benadryl cream   Peppermint Flavor [Flavoring Agent]     Causes horsenss    HOME MEDICATIONS: Outpatient Medications Prior to Visit  Medication Sig Dispense Refill   Accu-Chek Softclix Lancets lancets Use to check blood sugars twice daily E11.69 100 each 2   acetaminophen (TYLENOL) 500 MG tablet Take 500 mg by mouth every 6 (six) hours as needed.     allopurinol (ZYLOPRIM) 100 MG tablet Take 1 tablet (100 mg total) by mouth daily. 90 tablet 0   amLODipine (NORVASC) 10 MG tablet TAKE 1 TABLET(10 MG) BY MOUTH DAILY 90 tablet 2   aspirin EC 81 MG tablet Take 1 tablet (81 mg total) by mouth daily. Swallow whole. 90 tablet 3   Biotin 1000 MCG tablet biotin     blood glucose meter kit and supplies KIT Dispense based on patient and insurance preference. Use up to two times daily as directed. (FOR ICD-9  250.00, 250.01). 1 each 0   buPROPion (WELLBUTRIN XL) 150 MG 24 hr tablet TAKE 1 TABLET(150 MG) BY MOUTH EVERY MORNING 90 tablet 1   carvedilol (COREG) 6.25 MG tablet Please take 1.5 tablets twice daily 120 tablet 3   Cholecalciferol (VITAMIN D3) 125 MCG (5000 UT) CAPS Take 1 capsule by mouth daily at 12 noon.     clobetasol (TEMOVATE) 0.05 % external solution SMARTSIG:1 Sparingly Topical Twice Daily     Continuous Blood Gluc Sensor (FREESTYLE LIBRE 14 DAY SENSOR) MISC Use as directed to check blood sugars 2 times per day dx: B14.7 2 each 1   Garlic 8295 MG CAPS Take 1 capsule by mouth daily.     glucose blood (ACCU-CHEK SMARTVIEW) test strip Use to check blood sugars twice daily E11.69 100 each 2   metFORMIN (GLUCOPHAGE-XR) 500 MG 24 hr tablet Take 1 tablet (500 mg total) by mouth 2 (two) times daily. 180 tablet 1   Multiple Vitamin (MULTIVITAMIN) capsule Take 1 capsule by  mouth daily.     rosuvastatin (CRESTOR) 20 MG tablet TAKE 1 TABLET(20 MG) BY MOUTH DAILY 90 tablet 1   spironolactone (ALDACTONE) 50 MG tablet Take 50 mg by mouth daily.     valsartan-hydrochlorothiazide (DIOVAN-HCT) 160-25 MG tablet Take 1 tablet by mouth daily. 90 tablet 1   Facility-Administered Medications Prior to Visit  Medication Dose Route Frequency Provider Last Rate Last Admin   0.9 %  sodium chloride infusion  500 mL Intravenous Once Armbruster, Carlota Raspberry, MD        PAST MEDICAL HISTORY: Past Medical History:  Diagnosis Date   Anxiety    Breast cancer ([place])    Cancer ([place])    Diabetes mellitus without complication ([place])    Dysphagia    GERD (gastroesophageal reflux disease) 10/12/2020   Hair loss 10/12/2020   Heart murmur    Hx of adenomatous polyp of colon 06/2015   [name][facility]   Hypertension    Irregular heart beat    Sleep apnea     PAST SURGICAL HISTORY: Past Surgical History:  Procedure Laterality Date   BREAST EXCISIONAL BIOPSY Bilateral    BREAST LUMPECTOMY Left 2016     FAMILY HISTORY: Family History  Problem Relation Age of Onset   Heart failure Mother        CAD   COPD Mother    Hypertension Mother    Cancer Father    Rectal cancer Maternal Grandmother        Unsure   Colon cancer Maternal Grandmother    Heart attack Maternal Grandfather    Esophageal cancer Neg Hx    Stomach cancer Neg Hx     SOCIAL HISTORY: Social History   Socioeconomic History   Marital status: Widowed    Spouse name: Not on file   Number of children: Not on file   Years of education: Not on file   Highest education level: Not on file  Occupational History   Occupation: retired  Tobacco Use   Smoking status: Never   Smokeless tobacco: Never  Vaping Use   Vaping Use: Never used  Substance and Sexual Activity   Alcohol use: Never   Drug use: Never   Sexual activity: Not Currently  Other Topics Concern   Not on file  Social History Narrative   Lives home alone.  Widowed.  Retired.  MS counseling education. 4 cups coffee/ wk.     Social Determinants of Health   Financial Resource Strain: Low Risk  (12/04/2021)   Overall Financial Resource Strain (CARDIA)    Difficulty of Paying Living Expenses: Not hard at all  Food Insecurity: No Food Insecurity (12/04/2021)   Hunger Vital Sign    Worried About Running Out of Food in the Last Year: Never true    Ran Out of Food in the Last Year: Never true  Transportation Needs: No Transportation Needs (12/04/2021)   PRAPARE - Hydrologist (Medical): No    Lack of Transportation (Non-Medical): No  Physical Activity: Insufficiently Active (12/04/2021)   Exercise Vital Sign    Days of Exercise per Week: 2 days    Minutes of Exercise per Session: 60 min  Stress: No Stress Concern Present (12/04/2021)   [place]    Feeling of Stress : Not at all  Social Connections: Not on file  Intimate Partner Violence: Not At Risk  (10/14/2018)   Humiliation, Afraid, Rape, and Kick  questionnaire    Fear of Current or Ex-Partner: No    Emotionally Abused: No    Physically Abused: No    Sexually Abused: No    Marcial Pacas, M.D. Ph.D.  [facility] [place], [place] [address] Phone: [phone] Fax:      [phone]

## 2022-03-11 ENCOUNTER — Other Ambulatory Visit: Payer: Self-pay | Admitting: Nurse Practitioner

## 2022-03-11 ENCOUNTER — Ambulatory Visit: Payer: Self-pay

## 2022-03-11 DIAGNOSIS — E1169 Type 2 diabetes mellitus with other specified complication: Secondary | ICD-10-CM

## 2022-03-11 NOTE — Patient Outreach (Signed)
  Care Coordination   03/11/2022 Name: Jasmine Lopez MRN: [id number] DOB: 08-May-1941   Care Coordination Outreach Attempts:  An unsuccessful telephone outreach was attempted for a scheduled appointment today.  Follow Up Plan:  Additional outreach attempts will be made to offer the patient care coordination information and services.   Encounter Outcome:  No Answer  Care Coordination Interventions Activated:  No   Care Coordination Interventions:  No, not indicated    Barb Merino, RN, BSN, CCM Care Management Coordinator [facility] Care Management Direct Phone: [phone]

## 2022-03-12 DIAGNOSIS — M9903 Segmental and somatic dysfunction of lumbar region: Secondary | ICD-10-CM | POA: Diagnosis not present

## 2022-03-12 DIAGNOSIS — M9901 Segmental and somatic dysfunction of cervical region: Secondary | ICD-10-CM | POA: Diagnosis not present

## 2022-03-12 DIAGNOSIS — M5136 Other intervertebral disc degeneration, lumbar region: Secondary | ICD-10-CM | POA: Diagnosis not present

## 2022-03-12 DIAGNOSIS — M5032 Other cervical disc degeneration, mid-cervical region, unspecified level: Secondary | ICD-10-CM | POA: Diagnosis not present

## 2022-03-12 DIAGNOSIS — M47814 Spondylosis without myelopathy or radiculopathy, thoracic region: Secondary | ICD-10-CM | POA: Diagnosis not present

## 2022-03-12 DIAGNOSIS — M9902 Segmental and somatic dysfunction of thoracic region: Secondary | ICD-10-CM | POA: Diagnosis not present

## 2022-03-17 ENCOUNTER — Ambulatory Visit
Admission: RE | Admit: 2022-03-17 | Discharge: 2022-03-17 | Disposition: A | Discharge status: Home / Self Care | Payer: Medicare PPO | Source: Ambulatory Visit | Attending: Nurse Practitioner | Admitting: Nurse Practitioner

## 2022-03-17 DIAGNOSIS — Z1231 Encounter for screening mammogram for malignant neoplasm of breast: Secondary | ICD-10-CM

## 2022-03-18 NOTE — Progress Notes (Signed)
[facility] PREP Weekly Session  Patient Details  Name: Jasmine Lopez MRN: [id number] Date of Birth: [birth year] Age: 81 y.o. PCP: [name], FNP  Vitals:   03/17/22 1430  Weight: 191 lb 1.6 oz (86.7 kg)     [facility] Weekly seesion - 03/18/22 1700       [facility] "PREP" Location   [facility] "PREP" Location [facility]      Weekly Session   Topic Discussed Importance of resistance training;Other ways to be active    Minutes exercised this week 105 minutes    Classes attended to date [place] 03/18/2022, 5:19 PM

## 2022-03-19 DIAGNOSIS — M5136 Other intervertebral disc degeneration, lumbar region: Secondary | ICD-10-CM | POA: Diagnosis not present

## 2022-03-19 DIAGNOSIS — M9903 Segmental and somatic dysfunction of lumbar region: Secondary | ICD-10-CM | POA: Diagnosis not present

## 2022-03-19 DIAGNOSIS — M9902 Segmental and somatic dysfunction of thoracic region: Secondary | ICD-10-CM | POA: Diagnosis not present

## 2022-03-19 DIAGNOSIS — M9905 Segmental and somatic dysfunction of pelvic region: Secondary | ICD-10-CM | POA: Diagnosis not present

## 2022-03-19 DIAGNOSIS — M47817 Spondylosis without myelopathy or radiculopathy, lumbosacral region: Secondary | ICD-10-CM | POA: Diagnosis not present

## 2022-03-19 DIAGNOSIS — M5032 Other cervical disc degeneration, mid-cervical region, unspecified level: Secondary | ICD-10-CM | POA: Diagnosis not present

## 2022-03-19 DIAGNOSIS — M9901 Segmental and somatic dysfunction of cervical region: Secondary | ICD-10-CM | POA: Diagnosis not present

## 2022-03-19 DIAGNOSIS — M47814 Spondylosis without myelopathy or radiculopathy, thoracic region: Secondary | ICD-10-CM | POA: Diagnosis not present

## 2022-03-21 ENCOUNTER — Telehealth: Payer: Self-pay

## 2022-03-21 NOTE — Progress Notes (Unsigned)
  Chronic Care Management   Note  03/21/2022 Name: Jasmine Lopez MRN: [id number] DOB: 01-30-1941  Jasmine Lopez is a 81 y.o. year old female who is a primary care patient of Jasmine Lopez, Jasmine Lopez. I reached out to Jasmine Lopez by phone today in response to a referral sent by Jasmine Lopez's PCP.  Jasmine Lopez was not successfully contacted today. A HIPAA compliant voice message was left requesting a return call.   Follow up plan: Additional outreach attempts will be made.  Jasmine Lopez, [place], [place] [address] Direct Dial: [phone] [email or web address]

## 2022-03-24 NOTE — Progress Notes (Signed)
  Chronic Care Management   Note  03/24/2022 Name: Jasmine Lopez MRN: [id number] DOB: 03-30-1941  Jasmine Lopez is a 81 y.o. year old female who is a primary care patient of [name], [place]. I reached out to Cheryl Flash by phone today in response to a referral sent by Jasmine Lopez's PCP.  Jasmine Lopez  agreed to scheduling an appointment with the CCM RN Case Manager and Pharm D   Follow up plan: Patient agreed to scheduled appointment with RN Case Manager on 03/28/2022 and Jasmine Lopez 04/30/2022   Jasmine Lopez, [place], [place] [address] Direct Dial: [phone] [email or web address]

## 2022-03-25 NOTE — Progress Notes (Signed)
[facility] PREP Weekly Session  Patient Details  Name: Jasmine Lopez MRN: [id number] Date of Birth: Sep 06, 1940 Age: 81 y.o. PCP: [name], FNP  Vitals:   03/24/22 1430  Weight: 191 lb 6.4 oz (86.8 kg)     [facility] Weekly seesion - 03/25/22 1600       [facility] "PREP" Location   [facility] "PREP" Location [facility]      Weekly Session   Topic Discussed Healthy eating tips    Minutes exercised this week 130 minutes    Classes attended to date [place] 03/25/2022, 4:49 PM

## 2022-03-26 DIAGNOSIS — M5032 Other cervical disc degeneration, mid-cervical region, unspecified level: Secondary | ICD-10-CM | POA: Diagnosis not present

## 2022-03-26 DIAGNOSIS — M9901 Segmental and somatic dysfunction of cervical region: Secondary | ICD-10-CM | POA: Diagnosis not present

## 2022-03-26 DIAGNOSIS — M5136 Other intervertebral disc degeneration, lumbar region: Secondary | ICD-10-CM | POA: Diagnosis not present

## 2022-03-26 DIAGNOSIS — M47817 Spondylosis without myelopathy or radiculopathy, lumbosacral region: Secondary | ICD-10-CM | POA: Diagnosis not present

## 2022-03-26 DIAGNOSIS — M9902 Segmental and somatic dysfunction of thoracic region: Secondary | ICD-10-CM | POA: Diagnosis not present

## 2022-03-26 DIAGNOSIS — M47814 Spondylosis without myelopathy or radiculopathy, thoracic region: Secondary | ICD-10-CM | POA: Diagnosis not present

## 2022-03-26 DIAGNOSIS — M9903 Segmental and somatic dysfunction of lumbar region: Secondary | ICD-10-CM | POA: Diagnosis not present

## 2022-03-26 DIAGNOSIS — M9905 Segmental and somatic dysfunction of pelvic region: Secondary | ICD-10-CM | POA: Diagnosis not present

## 2022-03-28 ENCOUNTER — Telehealth: Payer: Medicare PPO

## 2022-04-02 NOTE — Progress Notes (Signed)
[facility] PREP Weekly Session  Patient Details  Name: Jasmine Lopez MRN: [id number] Date of Birth: 1940-08-09 Age: 81 y.o. PCP: [name], FNP  Vitals:   03/31/22 1430  Weight: 192 lb 3.2 oz (87.2 kg)     [facility] Weekly seesion - 04/02/22 1600       [facility] "PREP" Location   [facility] "PREP" Location [facility]      Weekly Session   Topic Discussed Health habits    Minutes exercised this week 140 minutes    Classes attended to date [place] 04/02/2022, 4:41 PM

## 2022-04-09 ENCOUNTER — Encounter: Payer: Self-pay | Admitting: Podiatry

## 2022-04-09 ENCOUNTER — Ambulatory Visit: Payer: Medicare PPO | Admitting: Podiatry

## 2022-04-09 DIAGNOSIS — B351 Tinea unguium: Secondary | ICD-10-CM

## 2022-04-09 DIAGNOSIS — E1169 Type 2 diabetes mellitus with other specified complication: Secondary | ICD-10-CM | POA: Diagnosis not present

## 2022-04-09 DIAGNOSIS — E785 Hyperlipidemia, unspecified: Secondary | ICD-10-CM

## 2022-04-09 DIAGNOSIS — M79674 Pain in right toe(s): Secondary | ICD-10-CM | POA: Diagnosis not present

## 2022-04-09 DIAGNOSIS — M79675 Pain in left toe(s): Secondary | ICD-10-CM

## 2022-04-09 NOTE — Progress Notes (Signed)
[facility] PREP Weekly Session  Patient Details  Name: Jasmine Lopez MRN: [id number] Date of Birth: 1940/12/15 Age: 81 y.o. PCP: [name], FNP  Vitals:   04/07/22 1430  Weight: 189 lb 12.8 oz (86.1 kg)     [facility] Weekly seesion - 04/09/22 1500       [facility] "PREP" Location   [facility] "PREP" Location [facility]      Weekly Session   Topic Discussed --   reviewed healthly habits, importance of sleep   Minutes exercised this week 70 minutes    Classes attended to date [place] 04/09/2022, 3:41 PM

## 2022-04-12 NOTE — Progress Notes (Signed)
  Subjective:  Patient ID: Jasmine Lopez, female    DOB: Nov 26, 1940,  MRN: [id number]  Jasmine Lopez presents to clinic today for preventative diabetic foot care and painful elongated mycotic toenails 1-5 bilaterally which are tender when wearing enclosed shoe gear. Pain is relieved with periodic professional debridement.  Chief Complaint  Patient presents with   Nail Problem    Diabetic foot care BS-did not check today A1C-6.0 PCP-Janece Moore PCP VST-2 Months ago   New problem(s): None.   PCP is [name], FNP.  Allergies  Allergen Reactions   Ace Inhibitors Cough   Atorvastatin Other (See Comments)    Leg pain    Benadryl [Diphenhydramine] Itching    Benadryl cream   Peppermint Flavor [Flavoring Agent]     Causes horsenss   Review of Systems: Negative except as noted in the HPI.  Objective: No changes noted in today's physical examination. There were no vitals filed for this visit.  Jasmine Lopez is a pleasant 81 y.o. female WD, WN in NAD. AAO x 3.  Vascular Examination: Capillary refill time <3 seconds b/l. Vascular status intact b/l with palpable pedal pulses. Pedal hair present b/l. No edema. No pain with calf compression b/l. Skin temperature gradient WNL b/l.   Neurological Examination: Sensation grossly intact b/l with 10 gram monofilament. Vibratory sensation intact b/l.   Dermatological Examination: Pedal skin with normal turgor, texture and tone b/l. Toenails 1-5 b/l thick, discolored, elongated with subungual debris and pain on dorsal palpation. No open wounds b/l LE. No interdigital macerations noted b/l LE.  Musculoskeletal Examination: Normal muscle strength 5/5 to all lower extremity muscle groups bilaterally. No pain, crepitus or joint limitation noted with ROM b/l LE. No gross bony pedal deformities b/l. Patient ambulates independently without assistive aids.  Radiographs: None  Last A1c:      Latest Ref Rng & Units 11/05/2021   10:05  AM 06/06/2021    2:21 PM  Hemoglobin A1C  Hemoglobin-A1c 4.8 - 5.6 % 7.5  7.6    Assessment/Plan: 1. Pain due to onychomycosis of toenails of both feet   2. Type 2 diabetes mellitus with hyperlipidemia (HCC)     No orders of the defined types were placed in this encounter.   -Consent given for treatment as described below: -Examined patient. -Continue foot and shoe inspections daily. Monitor blood glucose per PCP/Endocrinologist's recommendations. -Patient to continue soft, supportive shoe gear daily. -Toenails 1-5 b/l were debrided in length and girth with sterile nail nippers and dremel without iatrogenic bleeding.  -Patient/POA to call should there be question/concern in the interim.   Return in about 3 months (around 07/10/2022).  Marzetta Board, DPM

## 2022-04-15 NOTE — Progress Notes (Signed)
Angel patient is working with [place] guide scheduled with [facility] [place]: [phone]

## 2022-04-16 NOTE — Progress Notes (Signed)
[facility] PREP Weekly Session  Patient Details  Name: Jasmine Lopez MRN: [id number] Date of Birth: 12/03/1940 Age: 81 y.o. PCP: [name], FNP  There were no vitals filed for this visit.   [facility] Weekly seesion - 04/16/22 1000       [facility] "PREP" Location   [facility] "PREP" [facility]      Weekly Session   Topic Discussed Restaurant Eating   Salt and sugar demo   Minutes exercised this week 120 minutes    Classes attended to date 10   correction to 11/29: 8            Pam M Jerime Arif 04/16/2022, 10:07 AM

## 2022-04-22 NOTE — Progress Notes (Signed)
[facility] PREP Weekly Session  Patient Details  Name: Jasmine Lopez MRN: [id number] Date of Birth: [birth year] Age: 81 y.o. PCP: [name], FNP  Vitals:   04/21/22 1430  Weight: 189 lb 6.4 oz (85.9 kg)     [facility] Weekly seesion - 04/22/22 1100       [facility] "PREP" Location   [facility] "PREP" Location [facility]      Weekly Session   Topic Discussed Stress management and problem solving   meditation   Minutes exercised this week 150 minutes    Classes attended to date 12             [place] 04/22/2022, 11:30 AM

## 2022-04-23 ENCOUNTER — Telehealth: Payer: Self-pay

## 2022-04-23 NOTE — Chronic Care Management (AMB) (Addendum)
Chronic Care Management Pharmacy Assistant   Name: Jasmine Lopez  MRN: [id number] DOB: Aug 09, 1940  Reason for Encounter: Chart review for CPP visit on 04-30-2022   Conditions to be addressed/monitored: HTN, DMII, and Osteopenia  Recent office visits:  02-03-2022 Little, Claudette Stapler, RN (CCM).  01-27-2022 Little, Claudette Stapler, RN (CCM).  01-06-2022 [name], [place]. Abnormal UA. Referral placed to oncology.  12-25-2021 Christa See D, LCSW (CCM).  12-24-2021 Christa See D, LCSW (CCM).  12-04-2021 Kellie Simmering, LPN. Medicare wellness visit.  12-03-2021 Little, Claudette Stapler, RN (CCM).  11-05-2021 [name], [place]. Glucose= 145. A1C= 7.5. LDL= 125. CT Head WO contrast ordered.  Recent consult visits:  04-21-2022 Germain Osgood, RN. PREP class.  04-14-2022 Germain Osgood, RN. PREP class.  04-09-2022 Germain Osgood, RN. PREP class.  04-09-2022 Marzetta Board, DPM (Podiatry). Toenails 1-5 b/l were debrided in length and girth with sterile nail nippers and dremel without iatrogenic bleeding.  03-31-2022 Germain Osgood, RN. PREP class.  03-24-2022 Germain Osgood, RN. PREP class.  03-17-2022 Germain Osgood, RN. PREP class.  03-10-2022 Marcial Pacas, MD (Neurology). Follow up visit.  03-04-2022 Germain Osgood, RN. PREP class.  02-04-2022 Benay Pike, MD (Oncology). Follow up visit.  01-24-2022 Avelino Leeds (Cardiology). Visit for healthcare maintenance.  12-18-2021 Lorenda Peck, DPM (Podiatry). Mechanically debrided all nails 1-5 bilateral using sterile nail nipper and filed with dremel without incident.  11-28-2021 Bernarda Caffey, MD (Ophthalmology). Diabetic exam.   11-26-2021 Marcial Pacas, MD (Neurology). EEG completed.   11-13-2021 Loel Dubonnet, NP (Cardiology). INCREASE rosuvastatin 10 mg daily TO 20 mg daily. VAS US Carotid, echocardiogram, and long term monitor ordered.  11-06-2021 Suzzanne Cloud, NP (Neurology). CT head WO  contrast and eeg ordered.  Hospital visits:  None in previous 6 months  Medications: Outpatient Encounter Medications as of 04/23/2022  Medication Sig   Accu-Chek Softclix Lancets lancets Use to check blood sugars twice daily E11.69   acetaminophen (TYLENOL) 500 MG tablet Take 500 mg by mouth every 6 (six) hours as needed.   allopurinol (ZYLOPRIM) 100 MG tablet Take 1 tablet (100 mg total) by mouth daily.   amLODipine (NORVASC) 10 MG tablet TAKE 1 TABLET(10 MG) BY MOUTH DAILY   aspirin EC 81 MG tablet Take 1 tablet (81 mg total) by mouth daily. Swallow whole.   Biotin 1000 MCG tablet biotin   blood glucose meter kit and supplies KIT Dispense based on patient and insurance preference. Use up to two times daily as directed. (FOR ICD-9 250.00, 250.01).   buPROPion (WELLBUTRIN XL) 150 MG 24 hr tablet TAKE 1 TABLET(150 MG) BY MOUTH EVERY MORNING   carvedilol (COREG) 6.25 MG tablet Please take 1.5 tablets twice daily   Cholecalciferol (VITAMIN D3) 125 MCG (5000 UT) CAPS Take 1 capsule by mouth daily at 12 noon.   clobetasol (TEMOVATE) 0.05 % external solution SMARTSIG:1 Sparingly Topical Twice Daily   Continuous Blood Gluc Sensor (FREESTYLE LIBRE 14 DAY SENSOR) MISC Use as directed to check blood sugars 2 times per day dx: K59.9   Garlic 7741 MG CAPS Take 1 capsule by mouth daily.   glucose blood (ACCU-CHEK SMARTVIEW) test strip Use to check blood sugars twice daily E11.69   metFORMIN (GLUCOPHAGE-XR) 500 MG 24 hr tablet Take 1 tablet (500 mg total) by mouth 2 (two) times daily.   Multiple Vitamin (MULTIVITAMIN) capsule Take 1 capsule by mouth daily.   rosuvastatin (CRESTOR) 20 MG tablet TAKE 1 TABLET(20  MG) BY MOUTH DAILY   spironolactone (ALDACTONE) 50 MG tablet Take 50 mg by mouth daily.   valsartan-hydrochlorothiazide (DIOVAN-HCT) 160-25 MG tablet Take 1 tablet by mouth daily.   Facility-Administered Encounter Medications as of 04/23/2022  Medication   0.9 %  sodium chloride infusion    Lab Results  Component Value Date/Time   HGBA1C 7.5 (H) 11/05/2021 10:05 AM   HGBA1C 7.6 (H) 06/06/2021 02:21 PM   MICROALBUR 10 12/26/2020 10:51 AM   MICROALBUR 10 06/25/2020 11:44 AM     BP Readings from Last 3 Encounters:  03/10/22 129/69  03/04/22 118/60  02/04/22 (!) 153/74   Lab Results  Component Value Date/Time   HGBA1C 7.5 (H) 11/05/2021 10:05 AM   HGBA1C 7.6 (H) 06/06/2021 02:21 PM   MICROALBUR 10 12/26/2020 10:51 AM   MICROALBUR 10 06/25/2020 11:44 AM     BP Readings from Last 3 Encounters:  03/10/22 129/69  03/04/22 118/60  02/04/22 (!) 153/74     Have you seen any other providers since your last visit with PCP? Yes Patient stated PREP class and podiatry.  What is your top health concern to discuss at your upcoming visit? Patient stated she would like to discuss other options besides libre. Patient also stated she is concerned about her hair loss.  Do you have any problems getting your medications from the pharmacy? No  Financial barriers? No Access barriers? No  Jasmine Lopez was reminded to have all medications, supplements and any blood glucose and/or blood pressure readings available for review with Jasmine Lopez, Pharm. D, at her telephone visit on 04-30-2022 at  10:00.    04-23-2022: 1st attempt left VM 04-24-2022: 2nd attempt left VM 04-25-2022: 3rd attempt left VM 04-28-2022: Patient called back  Care Gaps: Last annual wellness visit? 12-04-2021 Last Colonoscopy? 01-10-2021 Last Mammogram? 03-17-2022 Last Bone Denisty? 10-03-2020 Last eye exam / retinopathy screening? 11-28-2021 Last diabetic foot exam? 04-09-2022  Immunizations: Shingrix overdue Flu vaccine overdue Covid booster overdue  Star Rating Drugs: Metformin 500 mg- Last filled 02-18-2022 90 [facility] Previous 11-29-2021 [facility]. Valsartan/HCTZ 160-25 mg- Last filled 02-13-2022 90 DS [facility]. Previous 07-28-2021 90 DS [facility] Rosuvastatin 20 mg- Last filled 12-02-2021 90  [facility]. Previous 11-14-2021 [facility].  Jeannette How [place] Clinical Pharmacist Assistant [phone]

## 2022-04-27 ENCOUNTER — Other Ambulatory Visit: Payer: Self-pay | Admitting: Nurse Practitioner

## 2022-04-30 ENCOUNTER — Ambulatory Visit (INDEPENDENT_AMBULATORY_CARE_PROVIDER_SITE_OTHER): Payer: Medicare PPO

## 2022-04-30 DIAGNOSIS — E782 Mixed hyperlipidemia: Secondary | ICD-10-CM

## 2022-04-30 DIAGNOSIS — E1169 Type 2 diabetes mellitus with other specified complication: Secondary | ICD-10-CM

## 2022-04-30 NOTE — Progress Notes (Signed)
[facility] PREP Weekly Session  Patient Details  Name: Jasmine Lopez MRN: [id number] Date of Birth: 09/15/1940 Age: 81 y.o. PCP: [name], FNP  Vitals:   04/28/22 1430  Weight: 191 lb 3.2 oz (86.7 kg)     [facility] Weekly seesion - 04/30/22 1000       [facility] "PREP" Location   [facility] "PREP" Location [facility]      Weekly Session   Topic Discussed Expectations and non-scale victories    Minutes exercised this week 180 minutes    Classes attended to date [place] 04/30/2022, 10:44 AM

## 2022-04-30 NOTE — Progress Notes (Signed)
Chronic Care Management Pharmacy Note  04/30/2022 Name:  Jasmine Lopez MRN:  [id number] DOB:  [birth year]  Summary: -She has concerns about her diabetes medication because it is not working. She would also like to have a CGM placed because she does not like to prick her finger to check her BS. She would also like to discuss the need for rosuvastatin.   Recommendations/Changes made from today's visit: Recommend adding Rybelsus to her current medication regimen  Recommend vaccinations be completed   Plan: Ms. Malter is going to get her COVID booster  Collaborate with PCP team and discuss adding Rybelsus to her current medication during January visit  Collaborate with CCM team in regards to patient concerns about her house and moisture  Subjective: Jasmine Lopez is an 81 y.o. year old female who is a primary patient of [name], [place].  The CCM team was consulted for assistance with disease management and care coordination needs.    Engaged with patient by telephone for initial visit in response to provider referral for pharmacy case management and/or care coordination services. She moved to [place] after being married to her husband. Recently back here, [place] since 2018 when her husband passed and her mother passed a couple months after her husband. She was taking care of both of them at one point. She lives currently in  in the home she grew up in. She now lives back in [place] for five  years and she has been able to adjust to home. Her children and grand children live close by to her.  She has two great grandsons and one on the way. She tries to keep up with her mental and physical health. Sometimes she feels overwhelmed and needs help. She would like to be on a different medication for her diabetes because it does not seem to be moving her along. She has issues with her fluctuation in her BS, she was supposed to check her BS, but she has not checked it today. She lost her  grandson this summer to suicide and that was hard on her.   Consent to Services:  The patient was given the following information about Chronic Care Management services today, agreed to services, and gave verbal consent: 1. CCM service includes personalized support from designated clinical staff supervised by the primary care provider, including individualized plan of care and coordination with other care providers 2. 24/7 contact phone numbers for assistance for urgent and routine care needs. 3. Service will only be billed when office clinical staff spend 20 minutes or more in a month to coordinate care. 4. Only one practitioner may furnish and bill the service in a calendar month. 5.The patient may stop CCM services at any time (effective at the end of the month) by phone call to the office staff. 6. The patient will be responsible for cost sharing (co-pay) of up to 20% of the service fee (after annual deductible is met). Patient agreed to services and consent obtained.  Patient Care Team: [name], [place] as PCP - General ([place]) Skeet Latch, MD as PCP - Cardiology (Cardiology) Ashok Cordia, RN as [place] Management  Recent office visits:  02-03-2022 Lynne Logan, RN (CCM).   01-27-2022 Little, Claudette Stapler, RN (CCM).   01-06-2022 [name], [place]. Abnormal UA. Referral placed to oncology.   12-25-2021 Christa See D, LCSW (CCM).   12-24-2021 Christa See D, LCSW (CCM).   12-04-2021 Kellie Simmering, LPN.  Medicare wellness visit.   12-03-2021 Little, Claudette Stapler, RN (CCM).   11-05-2021 [name], [place]. Glucose= 145. A1C= 7.5. LDL= 125. CT Head WO contrast ordered.   Recent consult visits:  04-21-2022 Germain Osgood, RN. PREP class.   04-14-2022 Germain Osgood, RN. PREP class.   04-09-2022 Germain Osgood, RN. PREP class.   04-09-2022 Marzetta Board, DPM (Podiatry). Toenails 1-5 b/l were debrided in length and  girth with sterile nail nippers and dremel without iatrogenic bleeding.   03-31-2022 Germain Osgood, RN. PREP class.   03-24-2022 Germain Osgood, RN. PREP class.   03-17-2022 Germain Osgood, RN. PREP class.   03-10-2022 Marcial Pacas, MD (Neurology). Follow up visit.   03-04-2022 Germain Osgood, RN. PREP class.   02-04-2022 Benay Pike, MD (Oncology). Follow up visit.   01-24-2022 Avelino Leeds (Cardiology). Visit for healthcare maintenance.   12-18-2021 Lorenda Peck, DPM (Podiatry). Mechanically debrided all nails 1-5 bilateral using sterile nail nipper and filed with dremel without incident.   11-28-2021 Bernarda Caffey, MD (Ophthalmology). Diabetic exam.    11-26-2021 Marcial Pacas, MD (Neurology). EEG completed.    11-13-2021 Loel Dubonnet, NP (Cardiology). INCREASE rosuvastatin 10 mg daily TO 20 mg daily. VAS US Carotid, echocardiogram, and long term monitor ordered.   11-06-2021 Suzzanne Cloud, NP (Neurology). CT head WO contrast and eeg ordered.   Hospital visits:  None in previous 6 months   Objective:  Lab Results  Component Value Date   CREATININE 0.81 11/05/2021   BUN 14 11/05/2021   EGFR 74 11/05/2021   GFRNONAA 63 06/25/2020   GFRAA 72 06/25/2020   NA 138 11/05/2021   K 4.4 11/05/2021   CALCIUM 9.5 11/05/2021   CO2 23 11/05/2021   GLUCOSE 145 (H) 11/05/2021    Lab Results  Component Value Date/Time   HGBA1C 7.5 (H) 11/05/2021 10:05 AM   HGBA1C 7.6 (H) 06/06/2021 02:21 PM   MICROALBUR 10 12/26/2020 10:51 AM   MICROALBUR 10 06/25/2020 11:44 AM    Last diabetic Eye exam:  Lab Results  Component Value Date/Time   HMDIABEYEEXA No Retinopathy 12/26/2021 12:00 AM    Last diabetic Foot exam: No results found for: "HMDIABFOOTEX"   Lab Results  Component Value Date   CHOL 191 11/05/2021   HDL 53 11/05/2021   LDLCALC 125 (H) 11/05/2021   TRIG 70 11/05/2021   CHOLHDL 3.6 11/05/2021       Latest Ref Rng & Units 11/05/2021   10:05 AM  06/06/2021    2:21 PM 12/26/2020    2:19 PM  Hepatic Function  Total Protein 6.0 - 8.5 g/dL 6.6  7.3  6.9   Albumin 3.7 - 4.7 g/dL 4.2  4.7  4.5   AST 0 - 40 IU/L _0 ALT 0 - 32 IU/L _1 Alk Phosphatase 44 - 121 IU/L 63  76  70   Total Bilirubin 0.0 - 1.2 mg/dL 0.2  0.2  0.4     Lab Results  Component Value Date/Time   TSH 1.370 01/24/2022 03:26 PM   TSH 1.910 10/12/2020 09:17 AM   FREET4 1.06 01/20/2019 12:55 PM       Latest Ref Rng & Units 11/05/2021   10:05 AM 12/26/2020    2:19 PM 10/13/2019    3:56 PM  CBC  WBC 3.4 - 10.8 x10E3/uL 4.4  5.2  6.1   Hemoglobin 11.1 - 15.9 g/dL 13.4  14.0  13.6   Hematocrit 34.0 - 46.6 % 37.7  42.4  40.3   Platelets 150 - 450 x10E3/uL 217  213  224     Lab Results  Component Value Date/Time   VD25OH 54.9 08/01/2019 03:47 PM   VITAMINB12 670 05/17/2019 02:04 PM    Clinical ASCVD: No  The 10-year ASCVD risk score (Arnett DK, et al., 2019) is: 36.8%   Values used to calculate the score:     Age: 8 years     Sex: Female     Is Non-Hispanic African American: Yes     Diabetic: Yes     Tobacco smoker: No     Systolic Blood Pressure: 970 mmHg     Is BP treated: Yes     HDL Cholesterol: 53 mg/dL     Total Cholesterol: 191 mg/dL       01/06/2022    9:30 AM 12/04/2021    9:21 AM 11/05/2021    9:05 AM  Depression screen PHQ 2/9  Decreased Interest 0 0 0  Down, Depressed, Hopeless 0 0 0  PHQ - 2 Score 0 0 0     Social History   Tobacco Use  Smoking Status Never  Smokeless Tobacco Never   BP Readings from Last 3 Encounters:  03/10/22 129/69  03/04/22 118/60  02/04/22 (!) 153/74   Pulse Readings from Last 3 Encounters:  03/10/22 62  03/04/22 74  02/04/22 60   Wt Readings from Last 3 Encounters:  04/28/22 191 lb 3.2 oz (86.7 kg)  04/21/22 189 lb 6.4 oz (85.9 kg)  04/07/22 189 lb 12.8 oz (86.1 kg)   BMI Readings from Last 3 Encounters:  04/28/22 31.82 kg/m  04/21/22 31.52 kg/m  04/07/22 31.58 kg/m     Assessment/Interventions: Review of patient past medical history, allergies, medications, health status, including review of consultants reports, laboratory and other test data, was performed as part of comprehensive evaluation and provision of chronic care management services.   SDOH:  (Social Determinants of Health) assessments and interventions performed: Yes SDOH Interventions    Flowsheet Row Chronic Care Management from 04/30/2022 in [facility] Clinical Support from 08/16/2020 in [facility] from 10/14/2018 in [facility]  SDOH Interventions     Housing Interventions --  Marlana Salvage is concerned about the moisture of the home and the age of the home] -- --  Depression Interventions/Treatment  -- -- PHQ2-9 Score <4 Follow-up Not Indicated  Financial Strain Interventions --  [Cost of medication] -- --  Physical Activity Interventions -- Other (Comments)  [Patient is engaged in health coaching to increase physical activity.] --      SDOH Screenings   Food Insecurity: No Food Insecurity (12/04/2021)  Housing: Medium Risk (04/30/2022)  Transportation Needs: No Transportation Needs (12/04/2021)  Depression (PHQ2-9): Low Risk  (01/06/2022)  Financial Resource Strain: High Risk (04/30/2022)  Physical Activity: Insufficiently Active (12/04/2021)  Stress: No Stress Concern Present (12/04/2021)  Tobacco Use: Low Risk  (04/09/2022)    CCM Care Plan  Allergies  Allergen Reactions   Ace Inhibitors Cough   Atorvastatin Other (See Comments)    Leg pain    Benadryl [Diphenhydramine] Itching    Benadryl cream   Peppermint Flavor [Flavoring Agent]     Causes horsenss    Medications Reviewed Today     Reviewed by Mayford Knife, RPH (Pharmacist) on 04/30/22 at 1117  Med List Status: <None>   Medication Order Taking? Sig  Documenting Provider Last Dose Status Informant  0.9 %  sodium chloride infusion [id number]    Armbruster, Carlota Raspberry, MD  Active   Accu-Chek Softclix Lancets lancets [id number] No Use to check blood sugars twice daily E11.69 [name], FNP Taking Active   acetaminophen (TYLENOL) 500 MG tablet [id number] No Take 500 mg by mouth every 6 (six) hours as needed. [provider] Taking Active   allopurinol (ZYLOPRIM) 100 MG tablet [id number]  TAKE 1 TABLET BY MOUTH EVERY DAY [name], FNP  Active   amLODipine (NORVASC) 10 MG tablet [id number] No TAKE 1 TABLET(10 MG) BY MOUTH DAILY Skeet Latch, MD Taking Active   aspirin EC 81 MG tablet [id number] No Take 1 tablet (81 mg total) by mouth daily. Swallow whole. Loel Dubonnet, NP Taking Active   Biotin 1000 MCG tablet [id number] No biotin [provider] Taking Active   blood glucose meter kit and supplies KIT [id number] No Dispense based on patient and insurance preference. Use up to two times daily as directed. (FOR ICD-9 250.00, 250.01). Rodriguez-Southworth, Sunday Spillers, PA-C Taking Active   buPROPion (WELLBUTRIN XL) 150 MG 24 hr tablet [id number] No TAKE 1 TABLET(150 MG) BY MOUTH EVERY MORNING [name], FNP Taking Active   carvedilol (COREG) 6.25 MG tablet [id number] No Please take 1.5 tablets twice daily Loel Dubonnet, NP Taking Active   Cholecalciferol (VITAMIN D3) 125 MCG (5000 UT) CAPS [id number] No Take 1 capsule by mouth daily at 12 noon. [provider] Taking Active   clobetasol (TEMOVATE) 0.05 % external solution [id number] No SMARTSIG:1 Sparingly Topical Twice Daily [provider] Taking Active   Continuous Blood Gluc Sensor (FREESTYLE LIBRE 14 DAY SENSOR) MISC [id number] No Use as directed to check blood sugars 2 times per day dx: e11.9 [name], FNP Taking Active   Garlic 0786 MG CAPS [id number] No Take 1 capsule by mouth daily. [provider] Taking Active   glucose blood (ACCU-CHEK SMARTVIEW) test strip [id number] No Use to check blood sugars twice daily E11.69 [name],  FNP Taking Active   metFORMIN (GLUCOPHAGE-XR) 500 MG 24 hr tablet [id number] No Take 1 tablet (500 mg total) by mouth 2 (two) times daily. [name], FNP Taking Active   Multiple Vitamin (MULTIVITAMIN) capsule [id number] No Take 1 capsule by mouth daily. [provider] Taking Active   rosuvastatin (CRESTOR) 20 MG tablet [id number] No TAKE 1 TABLET(20 MG) BY MOUTH DAILY [name], FNP Taking Active   spironolactone (ALDACTONE) 50 MG tablet [id number] No Take 50 mg by mouth daily. [provider] Taking Active   valsartan-hydrochlorothiazide (DIOVAN-HCT) 160-25 MG tablet [id number] No Take 1 tablet by mouth daily. Skeet Latch, MD Taking Active             Patient Active Problem List   Diagnosis Date Noted   Syncope 11/06/2021   Irregular heart beat 12/07/2020   Sleep apnea 12/07/2020   GERD (gastroesophageal reflux disease) 10/12/2020   Hair loss 10/12/2020   Memory loss 05/17/2019   Weight gain 04/15/2019   Upper airway cough syndrome with VCD component  02/02/2019   History of ductal carcinoma in situ (DCIS) of breast 12/28/2018   Pain of left breast 12/28/2018   Mixed hyperlipidemia 10/28/2018   Trigger finger, right middle finger 10/14/2018   Dry mouth, unspecified 10/14/2018   Allergic 07/15/2018   Hoarseness of voice 38/17/7116   Umbilical hernia 57/90/3833   Diastolic dysfunction 38/32/9191   Depression 11/07/2017   Heart murmur 11/07/2017   Obesity (  BMI 30.0-34.9) 08/24/2016   Type 2 diabetes mellitus with hyperlipidemia ([place]) 05/01/2016   Colon polyps 07/17/2015   Vitamin D deficiency 06/12/2015   Ductal carcinoma in situ (DCIS) of left breast 02/15/2015   Papilloma of right breast 02/15/2015   Idiopathic chronic gout 11/23/2014   Non-insulin dependent type 2 diabetes mellitus ([place]) 10/13/2013   Internal derangement of knee 12/29/2012   Osteoarthrosis involving lower leg 12/29/2012   Osteopenia 03/15/2012   Essential hypertension  06/20/2011    Immunization History  Administered Date(s) Administered   PFIZER(Purple Top)SARS-COV-2 Vaccination 07/04/2019, 07/25/2019, 02/17/2020   Pfizer Covid-19 Vaccine Bivalent Booster 24yr & up 04/01/2021   Tdap 11/05/2021    Conditions to be addressed/monitored:  Hypertension, Hyperlipidemia, and Diabetes  Care Plan : CWilson Updates made by PMayford Knife RKino Springssince 04/30/2022 12:00 AM     Problem: DM II, HLD   Priority: High     Goal: Disease Management   Note:   Current Barriers:  Unable to independently monitor therapeutic efficacy  Pharmacist Clinical Goal(s):  Patient will achieve adherence to monitoring guidelines and medication adherence to achieve therapeutic efficacy through collaboration with PharmD and provider.   Interventions: 1:1 collaboration with MMinette Brine FNP regarding development and update of comprehensive plan of care as evidenced by provider attestation and co-signature Inter-disciplinary care team collaboration (see longitudinal plan of care) Comprehensive medication review performed; medication list updated in electronic medical record  Hypertension (BP goal <140/90) -Controlled -Current treatment: Spironolactone 50 mg tablet once per day Appropriate, Effective, Safe, Accessible Valsartan - hydrochlorothiazide 160-25 mg tablet once per day Appropriate, Effective, Safe, Accessible Amlodipine 10 mg tablet once per day Appropriate, Effective, Safe, Accessible Carvedilol 6.25 mg tablet taking 1 and 1/2 tablet Appropriate, Effective, Safe, Accessible -Current home readings: she is not currently checking her BP at home  -Current [facility] habits[facility]. She does exercises for her strengthening and muscles. She does exercise from 2-3 two days per week, and an additional class once per week. She is learning how to use [facility] and she is working on her upper  body strength -Recommended to continue current medication  Hyperlipidemia: (LDL goal < 70) -Uncontrolled -Current treatment: Aspirin 81 mg EC tablet daily Appropriate, Effective, Safe, Accessible Rosuvastatin 20 mg tablet daily Appropriate, Effective, Safe, Accessible -Current dietary patterns: she is eating fast food  -Current exercise habits: -Current exercise habits: [facility]30 [facility]P program. She does exercises for her strengthening and muscles. She does exercise from 2-3 two days per week, and an additional class once per week. She is learning how to use the machines in the [facility] and she is working on her upper body strength -Ms. RKillileais concerned that her rosuvastatin is causing hair loss  -Educated on Cholesterol goals;  Benefits of statin for ASCVD risk reduction; Exercise goal of 150 minutes per week; -Recommended to continue current medication  Diabetes (A1c goal <7%) -Controlled -Current medications: Metformin XR 500 mg tablet twice per day Appropriate, Effective, Safe, Accessible -Current home glucose readings fasting glucose: 120-145  -Denies hypoglycemic/hyperglycemic symptoms -Current meal patterns from yesterday:  breakfast: steak biscuit with a cup of coffee lunch: [employer] $5 bag - chicken nuggets, and a hamburger- with one side of the bread  dinner: fruit and vegetable salad - home made with lettuce, spinach, onion,  celery, cucumber, fruit- chipotle dressing- has sugar in it, and a piece of bread drinks: coffee - cream and yellow pack sweetener, water everyday - 50-60 ounces per day sugar free root beer -Current exercise: she is going to the [employer] right now -Educated on Complications of diabetes including kidney damage, retinal damage, and cardiovascular disease; Benefits of routine self-monitoring of blood sugar; -Counseled to check feet daily and get yearly eye exams -She does not want to get an injection for medication but she feels  like she needs a change to her medication because of her sugar readings.  -When she is at home and on plan with exercise and eating she does well, but sometimes she reports that she is weak and has fast food or something sweet -We discussed in detail the importance of  -She would like to use the freestyle libre to check her BS because she does not like to prick her finger -Discussed how her A1c of 7.5 is associated with a BS of 169 -Discussed in detail the requirements for CGM include the use of insulin for it to be covered by medicare.  -Recommended to continue current medication Collaborated with PCP team and recommend patient be started on Rybelsus during January OV. If patient is able to tolerate, will recommend patient assistance program. [facility] to call Ms. Marrin about her ability to tolerate the new medication.   Idiopathic Gout: -Controlled -Current treatment  Allopurinol 100 mg tablet once per day Appropriate, Effective, Safe, Accessible -Recommended to continue current medication  Depression:  -Controlled -Current treatment: Bupropion XL 150 mg tablet daily Appropriate, Effective, Safe, Accessible -Recommended to continue current medication   Osteopenia/Vitamin D Deficiency: -Controlled -Current treatment  Vitamin D3 (cholecalciferol) 5000 UT- taking 1 tablet by mouth daily  -Recommended to continue current medication -Recommend Calcium supplementation of at least 12 mg of calcium daily   Osteopenia: -Controlled -Current treatment  Vitamin D3 (cholecalciferol) 5000 UT- taking 1 tablet by mouth daily  -Recommended to continue current medication -Recommend Calcium supplementation of at least 1200 mg of calcium daily  Hair Loss : -Uncontrolled -Current treatment  Spironolactone 50 mg tablet daily Appropriate, Effective, Safe, Accessible Biotin 1000 mcg taking 1 tablet daily Appropriate, Effective, Safe, Accessible -Ms. Nifong to continue to follow up with a specialist for her  hair loss  -Recommended to continue current medication   Health Maintenance -Vaccine gaps: Shingrix Vaccine, Influenza vaccine, COVID-19 Booster  -Current therapy:  Garlic 9833 mg capsule once per day Query Appropriate Cinnamon 500 mg tablet once per day  Query Appropriate Biotin 1000 mcg - taking 1 tablet daily Appropriate, Effective, Safe, Accessible Discussed the possible benefits, recommendation by dermatologist  -Educated on Herbal supplement research is limited and benefits usually cannot be proven Supplements may interfere with prescription drugs -Patient is not satisfied with therapy and will discontinue cinnamon and possibly garlic.  -Collaborated with Ms. Stotts and recommend that she possibly take a women's centrum silver daily over the current vitamins that she is taking    Patient Goals/Self-Care Activities Patient will:  - take medications as prescribed as evidenced by patient report and record review  Follow Up Plan: The patient has been provided with contact information for the care management team and has been advised to call with any health related questions or concerns.       Medication Assistance:  Collaborate with PCP team to start patient on Rybelsus during January office visit  Compliance/Adherence/Medication fill history: Care Gaps: -Shingrix Vaccine  -Influenza Vaccine -COVID-19 Vaccine  Star-Rating Drugs: Rosuvastatin 20 mg tablet  Metformin XR 500 mg tablet  Valsartan-hydrochlorothiazide 160-25 mg tablet   Patient's preferred pharmacy is:  [id number] [place], [place] - [address] AT [address] [place] [address] Phone: [phone] Fax: [phone]  [id number] [facility] [place] [address]Phone: [phone]: [phone] Uses pill box? Yes Pt endorses 90% compliance  We discussed: Benefits of medication synchronization, packaging and  delivery as well as enhanced pharmacist oversight with [employer]. Patient decided to: Continue current medication management strategy  Care Plan and Follow Up Patient Decision:  Patient agrees to Care Plan and Follow-up.  Plan: The patient has been provided with contact information for the care management team and has been advised to call with any health related questions or concerns.   VOrlando Penner CPP, PharmD Clinical Pharmacist Practitioner [facility] [phone]

## 2022-04-30 NOTE — Patient Instructions (Signed)
Visit Information It was great speaking with you today!  Please let me know if you have any questions about our visit.   Goals Addressed             This Visit's Progress    Manage My Medicine       Timeframe:  Long-Range Goal Priority:  High Start Date:                             Expected End Date:                       Follow Up Date 06/17/2022    - call for medicine refill 2 or 3 days before it runs out - keep a list of all the medicines I take; vitamins and herbals too - use a pillbox to sort medicine    Why is this important?   These steps will help you keep on track with your medicines.        Set My Target A1C-Diabetes Type 2       Timeframe:  Long-Range Goal Priority:  High Start Date:                             Expected End Date:                       Follow Up Date 06/17/2022    - set target A1C    Why is this important?   Your target A1C is decided together by you and your doctor.  It is based on several things like your age and other health issues.    Notes:  - Please let me know if you have any questions about your current medications         Patient Care Plan: [place] Plan     Problem Identified: DM II, HLD   Priority: High     Goal: Disease Management   Note:   Current Barriers:  Unable to independently monitor therapeutic efficacy  Pharmacist Clinical Goal(s):  Patient will achieve adherence to monitoring guidelines and medication adherence to achieve therapeutic efficacy through collaboration with PharmD and provider.   Interventions: 1:1 collaboration with [name], FNP regarding development and update of comprehensive plan of care as evidenced by provider attestation and co-signature Inter-disciplinary care team collaboration (see longitudinal plan of care) Comprehensive medication review performed; medication list updated in electronic medical record  Hypertension (BP goal <140/90) -Controlled -Current  treatment: Spironolactone 50 mg tablet once per day Appropriate, Effective, Safe, Accessible Valsartan - hydrochlorothiazide 160-25 mg tablet once per day Appropriate, Effective, Safe, Accessible Amlodipine 10 mg tablet once per day Appropriate, Effective, Safe, Accessible Carvedilol 6.25 mg tablet taking 1 and 1/2 tablet Appropriate, Effective, Safe, Accessible -Current home readings: she is not currently checking her BP at home  -Current [facility] habits: [facility]. She does exercises for her strengthening and muscles. She does exercise from 2-3 two days per week, and an additional class once per week. [facility] and she is working on her upper body strength -Recommended to continue current medication  Hyperlipidemia: (LDL goal < 70) -Uncontrolled -Current treatment: Aspirin 81 mg EC tablet daily Appropriate, Effective, Safe, Accessible Rosuvastatin 20 mg  tablet daily Appropriate, Effective, Safe, Accessible -Current dietary patterns: she is eating fast food  -Current exercise habits: -Current exercise habits: [facility]. She does exercises for her strengthening and muscles. She does exercise from 2-3 two days per week, and an additional class once per week. She is learning how to use [facility] in [facility] and she is working on her upper body strength -Ms. Cardiff is concerned that her rosuvastatin is causing hair loss  -Educated on Cholesterol goals;  Benefits of statin for ASCVD risk reduction; Exercise goal of 150 minutes per week; -Recommended to continue current medication  Diabetes (A1c goal <7%) -Controlled -Current medications: Metformin XR 500 mg tablet twice per day Appropriate, Effective, Safe, Accessible -Current home glucose readings fasting glucose: 120-145  -Denies hypoglycemic/hyperglycemic symptoms -Current meal patterns from  yesterday:  breakfast: steak biscuit with a cup of coffee lunch: [employer] $5 bag - chicken nuggets, and a hamburger- with one side of the bread  dinner: fruit and vegetable salad - home made with lettuce, spinach, onion, celery, cucumber, fruit- chipotle dressing- has sugar in it, and a piece of bread drinks: coffee - cream and yellow pack sweetener, water everyday - 50-60 ounces per day sugar free root beer -Current exercise: she is going to the [employer] right now -Educated on Complications of diabetes including kidney damage, retinal damage, and cardiovascular disease; Benefits of routine self-monitoring of blood sugar; -Counseled to check feet daily and get yearly eye exams -She does not want to get an injection for medication but she feels like she needs a change to her medication because of her sugar readings.  -When she is at home and on plan with exercise and eating she does well, but sometimes she reports that she is weak and has fast food or something sweet -We discussed in detail the importance of  -She would like to use the freestyle libre to check her BS because she does not like to prick her finger -Discussed how her A1c of 7.5 is associated with a BS of 169 -Discussed in detail the requirements for CGM include the use of insulin for it to be covered by medicare.  -Recommended to continue current medication Collaborated with PCP team and recommend patient be started on Rybelsus during January OV. If patient is able to tolerate, will recommend patient assistance program. [facility] to call Ms. Mcwethy about her ability to tolerate the new medication.   Idiopathic Gout: -Controlled -Current treatment  Allopurinol 100 mg tablet once per day Appropriate, Effective, Safe, Accessible -Recommended to continue current medication  Depression:  -Controlled -Current treatment: Bupropion XL 150 mg tablet daily Appropriate, Effective, Safe, Accessible -Recommended to continue current medication    Osteopenia/Vitamin D Deficiency: -Controlled -Current treatment  Vitamin D3 (cholecalciferol) 5000 UT- taking 1 tablet by mouth daily  -Recommended to continue current medication -Recommend Calcium supplementation of at least 12 mg of calcium daily   Osteopenia: -Controlled -Current treatment  Vitamin D3 (cholecalciferol) 5000 UT- taking 1 tablet by mouth daily  -Recommended to continue current medication -Recommend Calcium supplementation of at least 1200 mg of calcium daily  Hair Loss : -Uncontrolled -Current treatment  Spironolactone 50 mg tablet daily Appropriate, Effective, Safe, Accessible Biotin 1000 mcg taking 1 tablet daily Appropriate, Effective, Safe, Accessible -Ms. Moxey to continue to follow up with a specialist for her hair loss  -Recommended to continue current medication   Health Maintenance -Vaccine gaps: Shingrix  Vaccine, Influenza vaccine, COVID-19 Booster  -Current therapy:  Garlic 7672 mg capsule once per day Query Appropriate Cinnamon 500 mg tablet once per day  Query Appropriate Biotin 1000 mcg - taking 1 tablet daily Appropriate, Effective, Safe, Accessible Discussed the possible benefits, recommendation by dermatologist  -Educated on Herbal supplement research is limited and benefits usually cannot be proven Supplements may interfere with prescription drugs -Patient is not satisfied with therapy and will discontinue cinnamon and possibly garlic.  -Collaborated with Ms. Laidlaw and recommend that she possibly take a women's centrum silver daily over the current vitamins that she is taking    Patient Goals/Self-Care Activities Patient will:  - take medications as prescribed as evidenced by patient report and record review  Follow Up Plan: The patient has been provided with contact information for the care management team and has been advised to call with any health related questions or concerns.        Ms. Ferraiolo was given information about Chronic Care  Management services today including:  CCM service includes personalized support from designated clinical staff supervised by her physician, including individualized plan of care and coordination with other care providers 24/7 contact phone numbers for assistance for urgent and routine care needs. Standard insurance, coinsurance, copays and deductibles apply for chronic care management only during months in which we provide at least 20 minutes of these services. Most insurances cover these services at 100%, however patients may be responsible for any copay, coinsurance and/or deductible if applicable. This service may help you avoid the need for more expensive face-to-face services. Only one practitioner may furnish and bill the service in a calendar month. The patient may stop CCM services at any time (effective at the end of the month) by phone call to the office staff.  Patient agreed to services and verbal consent obtained.   The patient verbalized understanding of instructions, educational materials, and care plan provided today and agreed to receive a mailed copy of patient instructions, educational materials, and care plan.   Orlando Penner, PharmD Clinical Pharmacist Practitioner  [facility] [phone]

## 2022-05-02 DIAGNOSIS — R051 Acute cough: Secondary | ICD-10-CM | POA: Diagnosis not present

## 2022-05-02 DIAGNOSIS — R0982 Postnasal drip: Secondary | ICD-10-CM | POA: Diagnosis not present

## 2022-05-02 DIAGNOSIS — R110 Nausea: Secondary | ICD-10-CM | POA: Diagnosis not present

## 2022-05-02 DIAGNOSIS — R6883 Chills (without fever): Secondary | ICD-10-CM | POA: Diagnosis not present

## 2022-05-02 DIAGNOSIS — R5383 Other fatigue: Secondary | ICD-10-CM | POA: Diagnosis not present

## 2022-05-02 DIAGNOSIS — J101 Influenza due to other identified influenza virus with other respiratory manifestations: Secondary | ICD-10-CM | POA: Diagnosis not present

## 2022-05-08 ENCOUNTER — Telehealth: Payer: Self-pay

## 2022-05-08 NOTE — Telephone Encounter (Signed)
Transition Care Management Follow-up Telephone Call Date of discharge and from where: [facility]  How have you been since you were released from the hospital? Pt states she is doing better, she is still wheezing & coughing. She would like a medication to help with her cough. She declines appointment at this time, reports already being treated for the flu but not over coming the cough she experiences.  Any questions or concerns? Yes  Items Reviewed: Did the pt receive and understand the discharge instructions provided? Yes  Medications obtained and verified? Yes  Other? Yes  Any new allergies since your discharge? No  Dietary orders reviewed? Yes Do you have support at home? Yes   Home Care and Equipment/Supplies: Were home health services ordered? no If so, what is the name of the agency? N/a  Has the agency set up a time to come to the patient's home? no Were any new equipment or medical supplies ordered?  No What is the name of the medical supply agency? N/a Were you able to get the supplies/equipment? no Do you have any questions related to the use of the equipment or supplies? No  Functional Questionnaire: (I = Independent and D = Dependent) ADLs: i  Bathing/Dressing- i  Meal Prep- i  Eating- i  Maintaining continence- i  Transferring/Ambulation- i  Managing Meds- i  Follow up appointments reviewed:  PCP Hospital f/u appt confirmed? No  Scheduled to see n/a on n/a @ n/a. [facility] f/u appt confirmed? No  Scheduled to see n/a on n/a @ n/a. Are transportation arrangements needed? No  If their condition worsens, is the pt aware to call PCP or go to the Emergency Dept.? Yes Was the patient provided with contact information for the PCP's office or ED? Yes Was to pt encouraged to call back with questions or concerns? Yes

## 2022-05-08 NOTE — Telephone Encounter (Signed)
Transition Care Management Unsuccessful Follow-up Telephone Call  Date of discharge and from where:   05/06/2022 patient reports out of town location  Attempts:  1st Attempt  Reason for unsuccessful TCM follow-up call:  Left voice message

## 2022-05-11 DIAGNOSIS — E785 Hyperlipidemia, unspecified: Secondary | ICD-10-CM | POA: Diagnosis not present

## 2022-05-11 DIAGNOSIS — E1159 Type 2 diabetes mellitus with other circulatory complications: Secondary | ICD-10-CM | POA: Diagnosis not present

## 2022-05-11 DIAGNOSIS — I10 Essential (primary) hypertension: Secondary | ICD-10-CM

## 2022-05-11 DIAGNOSIS — Z7984 Long term (current) use of oral hypoglycemic drugs: Secondary | ICD-10-CM | POA: Diagnosis not present

## 2022-05-13 ENCOUNTER — Ambulatory Visit (INDEPENDENT_AMBULATORY_CARE_PROVIDER_SITE_OTHER): Payer: Medicare PPO | Admitting: Nurse Practitioner

## 2022-05-13 ENCOUNTER — Encounter: Payer: Self-pay | Admitting: Nurse Practitioner

## 2022-05-13 VITALS — BP 128/78 | HR 62 | Temp 98.1°F | Ht 65.0 in | Wt 181.8 lb

## 2022-05-13 DIAGNOSIS — Z6830 Body mass index (BMI) 30.0-30.9, adult: Secondary | ICD-10-CM

## 2022-05-13 DIAGNOSIS — Z79899 Other long term (current) drug therapy: Secondary | ICD-10-CM | POA: Diagnosis not present

## 2022-05-13 DIAGNOSIS — I10 Essential (primary) hypertension: Secondary | ICD-10-CM

## 2022-05-13 DIAGNOSIS — E6609 Other obesity due to excess calories: Secondary | ICD-10-CM

## 2022-05-13 DIAGNOSIS — R051 Acute cough: Secondary | ICD-10-CM | POA: Diagnosis not present

## 2022-05-13 DIAGNOSIS — E1169 Type 2 diabetes mellitus with other specified complication: Secondary | ICD-10-CM

## 2022-05-13 DIAGNOSIS — Z8709 Personal history of other diseases of the respiratory system: Secondary | ICD-10-CM | POA: Diagnosis not present

## 2022-05-13 DIAGNOSIS — E782 Mixed hyperlipidemia: Secondary | ICD-10-CM | POA: Diagnosis not present

## 2022-05-13 DIAGNOSIS — E785 Hyperlipidemia, unspecified: Secondary | ICD-10-CM | POA: Diagnosis not present

## 2022-05-13 MED ORDER — GUAIFENESIN ER 600 MG PO TB12: 600.0000 mg | ORAL_TABLET | Freq: Two times a day (BID) | ORAL | 2 refills | Status: DC

## 2022-05-13 MED ORDER — OZEMPIC (0.25 OR 0.5 MG/DOSE) 2 MG/1.5ML ~~LOC~~ SOPN: 0.5000 mg | PEN_INJECTOR | SUBCUTANEOUS | 1 refills | Status: DC

## 2022-05-13 MED ORDER — BENZONATATE 100 MG PO CAPS: 100.0000 mg | ORAL_CAPSULE | Freq: Three times a day (TID) | ORAL | 1 refills | Status: DC | PRN

## 2022-05-13 MED ORDER — AMOXICILLIN 875 MG PO TABS: 875.0000 mg | ORAL_TABLET | Freq: Two times a day (BID) | ORAL | 0 refills | Status: DC

## 2022-05-13 NOTE — Patient Instructions (Signed)
Hypertension, Adult ?Hypertension is another name for high blood pressure. High blood pressure forces your heart to work harder to pump blood. This can cause problems over time. ?There are two numbers in a blood pressure reading. There is a top number (systolic) over a bottom number (diastolic). It is best to have a blood pressure that is below 120/80. ?What are the causes? ?The cause of this condition is not known. Some other conditions can lead to high blood pressure. ?What increases the risk? ?Some lifestyle factors can make you more likely to develop high blood pressure: ?Smoking. ?Not getting enough exercise or physical activity. ?Being overweight. ?Having too much fat, sugar, calories, or salt (sodium) in your diet. ?Drinking too much alcohol. ?Other risk factors include: ?Having any of these conditions: ?Heart disease. ?Diabetes. ?High cholesterol. ?Kidney disease. ?Obstructive sleep apnea. ?Having a family history of high blood pressure and high cholesterol. ?Age. The risk increases with age. ?Stress. ?What are the signs or symptoms? ?High blood pressure may not cause symptoms. Very high blood pressure (hypertensive crisis) may cause: ?Headache. ?Fast or uneven heartbeats (palpitations). ?Shortness of breath. ?Nosebleed. ?Vomiting or feeling like you may vomit (nauseous). ?Changes in how you see. ?Very bad chest pain. ?Feeling dizzy. ?Seizures. ?How is this treated? ?This condition is treated by making healthy lifestyle changes, such as: ?Eating healthy foods. ?Exercising more. ?Drinking less alcohol. ?Your doctor may prescribe medicine if lifestyle changes do not help enough and if: ?Your top number is above 130. ?Your bottom number is above 80. ?Your personal target blood pressure may vary. ?Follow these instructions at home: ?Eating and drinking ? ?If told, follow the DASH eating plan. To follow this plan: ?Fill one half of your plate at each meal with fruits and vegetables. ?Fill one fourth of your plate  at each meal with whole grains. Whole grains include whole-wheat pasta, brown rice, and whole-grain bread. ?Eat or drink low-fat dairy products, such as skim milk or low-fat yogurt. ?Fill one fourth of your plate at each meal with low-fat (lean) proteins. Low-fat proteins include fish, chicken without skin, eggs, beans, and tofu. ?Avoid fatty meat, cured and processed meat, or chicken with skin. ?Avoid pre-made or processed food. ?Limit the amount of salt in your diet to less than 1,500 mg each day. ?Do not drink alcohol if: ?Your doctor tells you not to drink. ?You are pregnant, may be pregnant, or are planning to become pregnant. ?If you drink alcohol: ?Limit how much you have to: ?0-1 drink a day for women. ?0-2 drinks a day for men. ?Know how much alcohol is in your drink. In the [place] one drink equals one 12 oz bottle of beer (355 mL), one 5 oz glass of wine (148 mL), or one 1? oz glass of hard liquor (44 mL). ?Lifestyle ? ?Work with your doctor to stay at a healthy weight or to lose weight. Ask your doctor what the best weight is for you. ?Get at least 30 minutes of exercise that causes your heart to beat faster (aerobic exercise) most days of the week. This may include walking, swimming, or biking. ?Get at least 30 minutes of exercise that strengthens your muscles (resistance exercise) at least 3 days a week. This may include lifting weights or doing Pilates. ?Do not smoke or use any products that contain nicotine or tobacco. If you need help quitting, ask your doctor. ?Check your blood pressure at home as told by your doctor. ?Keep all follow-up visits. ?Medicines ?Take over-the-counter and prescription medicines   only as told by your doctor. Follow directions carefully. ?Do not skip doses of blood pressure medicine. The medicine does not work as well if you skip doses. Skipping doses also puts you at risk for problems. ?Ask your doctor about side effects or reactions to medicines that you should watch  for. ?Contact a doctor if: ?You think you are having a reaction to the medicine you are taking. ?You have headaches that keep coming back. ?You feel dizzy. ?You have swelling in your ankles. ?You have trouble with your vision. ?Get help right away if: ?You get a very bad headache. ?You start to feel mixed up (confused). ?You feel weak or numb. ?You feel faint. ?You have very bad pain in your: ?Chest. ?Belly (abdomen). ?You vomit more than once. ?You have trouble breathing. ?These symptoms may be an emergency. Get help right away. Call 911. ?Do not wait to see if the symptoms will go away. ?Do not drive yourself to the hospital. ?Summary ?Hypertension is another name for high blood pressure. ?High blood pressure forces your heart to work harder to pump blood. ?For most people, a normal blood pressure is less than 120/80. ?Making healthy choices can help lower blood pressure. If your blood pressure does not get lower with healthy choices, you may need to take medicine. ?This information is not intended to replace advice given to you by your health care provider. Make sure you discuss any questions you have with your health care provider. ?Document Revised: 02/14/2021 Document Reviewed: 02/14/2021 ?Elsevier Patient Education ? 2023 [employer] ? ?

## 2022-05-13 NOTE — Progress Notes (Signed)
I,Jasmine Lopez,acting as a Education administrator for Jasmine Brine, FNP.,have documented all relevant documentation on the behalf of Jasmine Brine, FNP,as directed by  Jasmine Brine, FNP while in the presence of Jasmine Lopez, [place].    Subjective:     Patient ID: Jasmine Lopez , female    DOB: Oct 28, 1940 , 82 y.o.   MRN: [id number]   Chief Complaint  Patient presents with   Hypertension   Diabetes    HPI  Pt presents today for bp & dm f/u.   She reports getting the flu before christmas. She went to [facility]. She states feeling good the last 3-4 days. She states still having a cough. She has been using cough drops & vitamin C. She has taking mucinex. She is drinking 3 bottles of water a day. She does drink almond milk with her coffee and oatmeal. She feels like her normal self.   She has been talking with Barb Merino RN about getting a machine for her blood sugar  Hypertension This is a chronic problem. The current episode started more than 1 year ago. The problem is unchanged. The problem is controlled. Pertinent negatives include no anxiety, headaches or shortness of breath. Risk factors for coronary artery disease include obesity. There are no compliance problems.  There is no history of angina. There is no history of chronic renal disease.  Diabetes Pertinent negatives for hypoglycemia include no headaches.     Past Medical History:  Diagnosis Date   Anxiety    Breast cancer ([place])    Cancer ([place])    Diabetes mellitus without complication ([place])    Dysphagia    GERD (gastroesophageal reflux disease) 10/12/2020   Hair loss 10/12/2020   Heart murmur    Hx of adenomatous polyp of colon 06/2015   [name]- [facility]   Hypertension    Irregular heart beat    Sleep apnea      Family History  Problem Relation Age of Onset   Heart failure Mother        CAD   COPD Mother    Hypertension Mother    Cancer Father    Rectal cancer Maternal Grandmother        Unsure   Colon  cancer Maternal Grandmother    Heart attack Maternal Grandfather    Esophageal cancer Neg Hx    Stomach cancer Neg Hx    Breast cancer Neg Hx      Current Outpatient Medications:    acetaminophen (TYLENOL) 500 MG tablet, Take 500 mg by mouth every 6 (six) hours as needed., Disp: , Rfl:    allopurinol (ZYLOPRIM) 100 MG tablet, TAKE 1 TABLET BY MOUTH EVERY DAY, Disp: 90 tablet, Rfl: 0   amLODipine (NORVASC) 10 MG tablet, TAKE 1 TABLET(10 MG) BY MOUTH DAILY, Disp: 90 tablet, Rfl: 2   amoxicillin (AMOXIL) 875 MG tablet, Take 1 tablet (875 mg total) by mouth 2 (two) times daily., Disp: 14 tablet, Rfl: 0   aspirin EC 81 MG tablet, Take 1 tablet (81 mg total) by mouth daily. Swallow whole., Disp: 90 tablet, Rfl: 3   benzonatate (TESSALON PERLES) 100 MG capsule, Take 1 capsule (100 mg total) by mouth 3 (three) times daily as needed for cough., Disp: 30 capsule, Rfl: 1   Biotin 1000 MCG tablet, biotin, Disp: , Rfl:    buPROPion (WELLBUTRIN XL) 150 MG 24 hr tablet, TAKE 1 TABLET(150 MG) BY MOUTH EVERY MORNING, Disp: 90 tablet, Rfl: 1   carvedilol (COREG)  6.25 MG tablet, Please take 1.5 tablets twice daily, Disp: 120 tablet, Rfl: 3   Cholecalciferol (VITAMIN D3) 125 MCG (5000 UT) CAPS, Take 1 capsule by mouth daily at 12 noon., Disp: , Rfl:    clobetasol (TEMOVATE) 0.05 % external solution, SMARTSIG:1 Sparingly Topical Twice Daily, Disp: , Rfl:    Garlic 7017 MG CAPS, Take 1 capsule by mouth daily., Disp: , Rfl:    guaiFENesin (MUCINEX) 600 MG 12 hr tablet, Take 1 tablet (600 mg total) by mouth 2 (two) times daily., Disp: 60 tablet, Rfl: 2   metFORMIN (GLUCOPHAGE-XR) 500 MG 24 hr tablet, Take 1 tablet (500 mg total) by mouth 2 (two) times daily., Disp: 180 tablet, Rfl: 1   Multiple Vitamin (MULTIVITAMIN) capsule, Take 1 capsule by mouth daily., Disp: , Rfl:    rosuvastatin (CRESTOR) 20 MG tablet, TAKE 1 TABLET(20 MG) BY MOUTH DAILY, Disp: 90 tablet, Rfl: 1   Semaglutide,0.25 or 0.5MG/DOS, (OZEMPIC,  0.25 OR 0.5 MG/DOSE,) 2 MG/1.5ML SOPN, Inject 0.5 mg into the skin once a week., Disp: 4.5 mL, Rfl: 1   spironolactone (ALDACTONE) 50 MG tablet, Take 50 mg by mouth daily., Disp: , Rfl:    valsartan-hydrochlorothiazide (DIOVAN-HCT) 160-25 MG tablet, Take 1 tablet by mouth daily., Disp: 90 tablet, Rfl: 1   Accu-Chek Softclix Lancets lancets, Use to check blood sugars twice daily E11.69 (Patient not taking: Reported on 05/13/2022), Disp: 100 each, Rfl: 2   blood glucose meter kit and supplies KIT, Dispense based on patient and insurance preference. Use up to two times daily as directed. (FOR ICD-9 250.00, 250.01). (Patient not taking: Reported on 05/13/2022), Disp: 1 each, Rfl: 0   Continuous Blood Gluc Sensor (FREESTYLE LIBRE 14 DAY SENSOR) MISC, Use as directed to check blood sugars 2 times per day dx: e11.9 (Patient not taking: Reported on 05/13/2022), Disp: 2 each, Rfl: 1   glucose blood (ACCU-CHEK SMARTVIEW) test strip, Use to check blood sugars twice daily E11.69 (Patient not taking: Reported on 05/13/2022), Disp: 100 each, Rfl: 2  Current Facility-Administered Medications:    0.9 %  sodium chloride infusion, 500 mL, Intravenous, Once, Armbruster, Carlota Raspberry, MD   Allergies  Allergen Reactions   Ace Inhibitors Cough   Atorvastatin Other (See Comments)    Leg pain    Benadryl [Diphenhydramine] Itching    Benadryl cream   Peppermint Flavor [Flavoring Agent]     Causes horsenss     Review of Systems  Constitutional: Negative.   Respiratory: Negative.  Negative for shortness of breath.   Cardiovascular: Negative.   Neurological: Negative.  Negative for headaches.  Psychiatric/Behavioral: Negative.       Today's Vitals   05/13/22 0942  BP: 128/78  Pulse: 62  Temp: 98.1 F (36.7 C)  SpO2: 98%  Weight: 181 lb 12.8 oz (82.5 kg)  Height: _0  (1.651 m)   Body mass index is 30.25 kg/m.  Wt Readings from Last 3 Encounters:  05/13/22 181 lb 12.8 oz (82.5 kg)  04/28/22 191 lb 3.2 oz (86.7  kg)  04/21/22 189 lb 6.4 oz (85.9 kg)    Objective:  Physical Exam Vitals reviewed.  Constitutional:      General: She is not in acute distress.    Appearance: Normal appearance. She is obese.  Cardiovascular:     Rate and Rhythm: Normal rate and regular rhythm.     Pulses: Normal pulses.     Heart sounds: Normal heart sounds. No murmur heard. Pulmonary:     Effort:  Pulmonary effort is normal. No respiratory distress.     Breath sounds: Normal air entry. Examination of the right-lower field reveals rales. Examination of the left-lower field reveals rales. Rales present. No wheezing.  Skin:    General: Skin is warm and dry.     Capillary Refill: Capillary refill takes less than 2 seconds.  Neurological:     General: No focal deficit present.     Mental Status: She is alert and oriented to person, place, and time.     Cranial Nerves: No cranial nerve deficit.     Motor: No weakness.         Assessment And Plan:     1. Type 2 diabetes mellitus with hyperlipidemia (HCC) Comments: Hemoglobin A1c is at 7.5, will start Ozempic 0.25 mg to 0.5 mg once after 4 weeks.  Will pick up medication and bring for nurse visit for teaching.  Discussed side effects to include nausea and constipation.  Continue metformin at this time. - Hemoglobin A1c - Semaglutide,0.25 or 0.5MG/DOS, (OZEMPIC, 0.25 OR 0.5 MG/DOSE,) 2 MG/1.5ML SOPN; Inject 0.5 mg into the skin once a week.  Dispense: 4.5 mL; Refill: 1  2. Essential hypertension Comments: Blood pressure is controlled, continue current medications - CMP14+EGFR  3. Mixed hyperlipidemia Comments: Continue statin, tolerating well - Lipid panel  4. Acute cough Comments: He was diagnosed with influenza 2 weeks ago.  Continues to have cough.  Rales noted to bilateral lower lobes.  Will treat with antibiotic - benzonatate (TESSALON PERLES) 100 MG capsule; Take 1 capsule (100 mg total) by mouth 3 (three) times daily as needed for cough.  Dispense: 30  capsule; Refill: 1 - guaiFENesin (MUCINEX) 600 MG 12 hr tablet; Take 1 tablet (600 mg total) by mouth 2 (two) times daily.  Dispense: 60 tablet; Refill: 2 - amoxicillin (AMOXIL) 875 MG tablet; Take 1 tablet (875 mg total) by mouth 2 (two) times daily.  Dispense: 14 tablet; Refill: 0  5. History of influenza - amoxicillin (AMOXIL) 875 MG tablet; Take 1 tablet (875 mg total) by mouth 2 (two) times daily.  Dispense: 14 tablet; Refill: 0  6. Other long term (current) drug therapy - CBC  7. Class 1 obesity due to excess calories with serious comorbidity and body mass index (BMI) of 30.0 to 30.9 in adult She is encouraged to strive for BMI less than 30 to decrease cardiac risk. Advised to aim for at least 150 minutes of exercise per week.     Patient was given opportunity to ask questions. Patient verbalized understanding of the plan and was able to repeat key elements of the plan. All questions were answered to their satisfaction.  Jasmine Brine, FNP   I, Jasmine Brine, FNP, have reviewed all documentation for this visit. The documentation on 05/13/22 for the exam, diagnosis, procedures, and orders are all accurate and complete.   IF YOU HAVE BEEN REFERRED TO A SPECIALIST, IT MAY TAKE 1-2 WEEKS TO SCHEDULE/PROCESS THE REFERRAL. IF YOU HAVE NOT HEARD FROM US/SPECIALIST IN TWO WEEKS, PLEASE GIVE [place] A CALL AT [phone] X [phone].   THE PATIENT IS ENCOURAGED TO PRACTICE SOCIAL DISTANCING DUE TO THE COVID-19 PANDEMIC.

## 2022-05-14 ENCOUNTER — Other Ambulatory Visit (HOSPITAL_BASED_OUTPATIENT_CLINIC_OR_DEPARTMENT_OTHER): Payer: Self-pay | Admitting: Cardiovascular Disease

## 2022-05-14 LAB — CMP14+EGFR
ALT: 21 IU/L (ref 0–32)
AST: 16 IU/L (ref 0–40)
Albumin/Globulin Ratio: 1.8 (ref 1.2–2.2)
Albumin: 4.3 g/dL (ref 3.8–4.8)
Alkaline Phosphatase: 55 IU/L (ref 44–121)
BUN/Creatinine Ratio: 13 (ref 12–28)
BUN: 10 mg/dL (ref 8–27)
Bilirubin Total: 0.3 mg/dL (ref 0.0–1.2)
CO2: 24 mmol/L (ref 20–29)
Calcium: 9.6 mg/dL (ref 8.7–10.3)
Chloride: 98 mmol/L (ref 96–106)
Creatinine, Ser: 0.8 mg/dL (ref 0.57–1.00)
Globulin, Total: 2.4 g/dL (ref 1.5–4.5)
Glucose: 163 mg/dL — ABNORMAL HIGH (ref 70–99)
Potassium: 5 mmol/L (ref 3.5–5.2)
Sodium: 138 mmol/L (ref 134–144)
Total Protein: 6.7 g/dL (ref 6.0–8.5)
eGFR: 75 mL/min/{1.73_m2} (ref >59)

## 2022-05-14 LAB — LIPID PANEL
Chol/HDL Ratio: 2.9 ratio (ref 0.0–4.4)
Cholesterol, Total: 134 mg/dL (ref 100–199)
HDL: 47 mg/dL (ref >39)
LDL Chol Calc (NIH): 72 mg/dL (ref 0–99)
Triglycerides: 76 mg/dL (ref 0–149)
VLDL Cholesterol Cal: 15 mg/dL (ref 5–40)

## 2022-05-14 LAB — HEMOGLOBIN A1C
Est. average glucose Bld gHb Est-mCnc: 180 mg/dL
Hgb A1c MFr Bld: 7.9 % — ABNORMAL HIGH (ref 4.8–5.6)

## 2022-05-14 LAB — CBC
Hematocrit: 44.1 % (ref 34.0–46.6)
Hemoglobin: 14.2 g/dL (ref 11.1–15.9)
MCH: 28.6 pg (ref 26.6–33.0)
MCHC: 32.2 g/dL (ref 31.5–35.7)
MCV: 89 fL (ref 79–97)
Platelets: 338 10*3/uL (ref 150–450)
RBC: 4.97 x10E6/uL (ref 3.77–5.28)
RDW: 11.9 % (ref 11.7–15.4)
WBC: 4.2 10*3/uL (ref 3.4–10.8)

## 2022-05-14 NOTE — Telephone Encounter (Signed)
Rx(s) sent to pharmacy electronically.  

## 2022-05-20 NOTE — Telephone Encounter (Signed)
Chmg-error.  

## 2022-05-20 NOTE — Progress Notes (Signed)
[facility] PREP Weekly Session  Patient Details  Name: Jasmine Lopez MRN: [id number] Date of Birth: [birth year] Age: 82 y.o. PCP: [name], FNP  Vitals:   05/19/22 1430  Weight: 185 lb 6.4 oz (84.1 kg)     [facility] Weekly seesion - 05/20/22 1500       [facility] "PREP" Location   [facility] "PREP" Location [facility]      Weekly Session   Topic Discussed Other   portions   Minutes exercised this week 45 minutes    Classes attended to date 8             [place] 05/20/2022, 3:19 PM

## 2022-06-04 NOTE — Progress Notes (Signed)
[facility] PREP Weekly Session  Patient Details  Name: Jasmine Lopez MRN: [id number] Date of Birth: Nov 30, 1940 Age: 82 y.o. PCP: [name], FNP  Vitals:   06/02/22 1430  Weight: 187 lb (84.8 kg)     [facility] Weekly seesion - 06/04/22 1200       [facility] "PREP" Location   [facility] "PREP" Location [facility]      Weekly Session   Topic Discussed Calorie breakdown    Minutes exercised this week 90 minutes    Classes attended to date 106             [place] 06/04/2022, 12:30 PM

## 2022-06-12 NOTE — Progress Notes (Signed)
[facility] PREP Evaluation  Patient Details  Name: Jasmine Lopez MRN: [id number] Date of Birth: 1940/07/17 Age: 82 y.o. PCP: [name], FNP  Vitals:   06/11/22 1450  BP: 100/60  Pulse: 73  SpO2: 97%  Weight: 185 lb 6.4 oz (84.1 kg)   Asked pt to follow up with provider about low BP does occasionally have dizziness with bending over and then standing.   [facility] Eval - 06/12/22 1200       [facility] "PREP" Location   [facility] "PREP" Location [facility]      Referral    Referring Provider [employer] Start Date 06/11/22   final class date     Measurement   Waist Circumference 42 inches    Hip Circumference 45 inches    Body fat 47.3 percent      Information for Trainer   Goals plans on continuing exercise      Mobility and Daily Activities   I find it easy to walk up or down two or more flights of stairs. 2    I have no trouble taking out the trash. 4    I do housework such as vacuuming and dusting on my own without difficulty. 3    I can easily lift a gallon of milk (8lbs). 4    I can easily walk a mile. 2    I have no trouble reaching into high cupboards or reaching down to pick up something from the floor. 4    I do not have trouble doing out-door work such as Armed forces logistics/support/administrative officer, raking leaves, or gardening. 1      Mobility and Daily Activities   I feel younger than my age. 3    I feel independent. 4    I feel energetic. 3    I live an active life.  4    I feel strong. 3    I feel healthy. 3    I feel active as other people my age. 3      How fit and strong are you.   Fit and Strong Total Score 43            Past Medical History:  Diagnosis Date   Anxiety    Breast cancer ([place])    Cancer ([place])    Diabetes mellitus without complication ([place])    Dysphagia    GERD (gastroesophageal reflux disease) 10/12/2020   Hair loss 10/12/2020   Heart murmur    Hx of adenomatous polyp of colon 06/2015   [name]- [facility]   Hypertension    Irregular  heart beat    Sleep apnea    Past Surgical History:  Procedure Laterality Date   BREAST EXCISIONAL BIOPSY Bilateral    BREAST LUMPECTOMY Left 2016   Social History   Tobacco Use  Smoking Status Never  Smokeless Tobacco Never  Attended 20 sessions, 10 of 12 educational sessions Fit testing: Cardio march test: 189 to 240 Sit to stand: 18 to 12 Bicep curl: 15 to 18 Encouraged to continue with balance exercises   [name] 06/12/2022, 12:18 PM

## 2022-06-13 ENCOUNTER — Telehealth: Payer: Self-pay

## 2022-06-13 NOTE — Progress Notes (Signed)
Care Management & Coordination Services Pharmacy Team  Reason for Encounter: Appointment Reminder  Contacted patient to confirm telephone appointment with Orlando Penner, PharmD on 06-17-2022 at 10:00. Unsuccessful outreach. Left voicemail for patient to return call.   Chart review: Recent office visits:  05-13-2022 [name], [place]. Glucose= 163. A1C= 7.9. START amoxicillin 875 mg twice daily, Tessalon 100 mg 3 times daily PRN, Mucinex 600 mg twice daily and Ozempic 0.5 mg weekly.  Recent consult visits:  06-11-2022 Germain Osgood, RN. PREP class  06-02-2022 Germain Osgood, RN. PREP class  05-19-2022 Germain Osgood, RN. [place] class  Hospital visits:  None in previous 6 months  Star Rating Drugs:  Metformin 500 mg- Last filled [facility]-18-2022 [facility]21 [facility]. Valsartan/HCTZ 160-25 mg- Last filled 05-15-2022 90 [facility]. Previous 02-13-2022 90 DS [facility] Rosuvastatin 20 mg- Last filled 12-02-2021 [facility]14-2021 [facility]  Care Gaps: Annual wellness visit in last year? Yes Covid booster overdue  PNA Vac overdue  If Diabetic: Last eye exam / retinopathy screening: 11-28-2021  Last diabetic foot exam: 04-09-2022    [place] Clinical Pharmacist Assistant [phone]

## 2022-06-17 ENCOUNTER — Telehealth: Payer: Self-pay

## 2022-06-17 NOTE — Progress Notes (Unsigned)
Care Management & Coordination Services Pharmacy Note  06/17/2022 Name:  Jasmine Lopez MRN:  [id number] DOB:  08/22/1940  Summary: ***  Recommendations/Changes made from today's visit: ***  Follow up plan: ***   Subjective: Jasmine Lopez is an 82 y.o. year old female who is a primary patient of [name], [place].  The care coordination team was consulted for assistance with disease management and care coordination needs.    {CCMTELEPHONEFACETOFACE:21091510} for {CCMINITIALFOLLOWUPCHOICE:21091511}.  Recent office visits: ***  Recent consult visits: ***  Hospital visits: {Hospital DC Yes/No:25215}   Objective:  Lab Results  Component Value Date   CREATININE 0.80 05/13/2022   BUN 10 05/13/2022   EGFR 75 05/13/2022   GFRNONAA 63 06/25/2020   GFRAA 72 06/25/2020   NA 138 05/13/2022   K 5.0 05/13/2022   CALCIUM 9.6 05/13/2022   CO2 24 05/13/2022   GLUCOSE 163 (H) 05/13/2022    Lab Results  Component Value Date/Time   HGBA1C 7.9 (H) 05/13/2022 10:42 AM   HGBA1C 7.5 (H) 11/05/2021 10:05 AM   MICROALBUR 10 12/26/2020 10:51 AM   MICROALBUR 10 06/25/2020 11:44 AM    Last diabetic Eye exam:  Lab Results  Component Value Date/Time   HMDIABEYEEXA No Retinopathy 12/26/2021 12:00 AM    Last diabetic Foot exam: No results found for: "HMDIABFOOTEX"   Lab Results  Component Value Date   CHOL 134 05/13/2022   HDL 47 05/13/2022   LDLCALC 72 05/13/2022   TRIG 76 05/13/2022   CHOLHDL 2.9 05/13/2022       Latest Ref Rng & Units 05/13/2022   10:42 AM 11/05/2021   10:05 AM 06/06/2021    2:21 PM  Hepatic Function  Total Protein 6.0 - 8.5 g/dL 6.7  6.6  7.3   Albumin 3.8 - 4.8 g/dL 4.3  4.2  4.7   AST 0 - 40 IU/L '16  18  18   '$ ALT 0 - 32 IU/L '21  16  21   '$ Alk Phosphatase 44 - 121 IU/L 55  63  76   Total Bilirubin 0.0 - 1.2 mg/dL 0.3  0.2  0.2     Lab Results  Component Value Date/Time   TSH 1.370 01/24/2022 03:26 PM   TSH 1.910 10/12/2020 09:17 AM   FREET4  1.06 01/20/2019 12:55 PM       Latest Ref Rng & Units 05/13/2022   10:42 AM 11/05/2021   10:05 AM 12/26/2020    2:19 PM  CBC  WBC 3.4 - 10.8 x10E3/uL 4.2  4.4  5.2   Hemoglobin 11.1 - 15.9 g/dL 14.2  13.4  14.0   Hematocrit 34.0 - 46.6 % 44.1  37.7  42.4   Platelets 150 - 450 x10E3/uL 338  217  213     Lab Results  Component Value Date/Time   VD25OH 54.9 08/01/2019 03:47 PM   VITAMINB12 670 05/17/2019 02:04 PM    Clinical ASCVD: No  The 10-year ASCVD risk score (Arnett DK, et al., 2019) is: 20.2%   Values used to calculate the score:     Age: 5 years     Sex: Female     Is Non-Hispanic African American: Yes     Diabetic: Yes     Tobacco smoker: No     Systolic Blood Pressure: 263 mmHg     Is BP treated: Yes     HDL Cholesterol: 47 mg/dL     Total Cholesterol: 134 mg/dL    ***Other: (CHADS2VASc if Afib,  MMRC or CAT for COPD, ACT, DEXA)     05/13/2022    9:40 AM 01/06/2022    9:30 AM 12/04/2021    9:21 AM  Depression screen PHQ 2/9  Decreased Interest 0 0 0  Down, Depressed, Hopeless 0 0 0  PHQ - 2 Score 0 0 0     Social History   Tobacco Use  Smoking Status Never  Smokeless Tobacco Never   BP Readings from Last 3 Encounters:  06/11/22 100/60  05/13/22 128/78  03/10/22 129/69   Pulse Readings from Last 3 Encounters:  06/11/22 73  05/13/22 62  03/10/22 62   Wt Readings from Last 3 Encounters:  06/11/22 185 lb 6.4 oz (84.1 kg)  06/02/22 187 lb (84.8 kg)  05/19/22 185 lb 6.4 oz (84.1 kg)   BMI Readings from Last 3 Encounters:  06/11/22 30.85 kg/m  06/02/22 31.12 kg/m  05/19/22 30.85 kg/m    Allergies  Allergen Reactions   Ace Inhibitors Cough   Atorvastatin Other (See Comments)    Leg pain    Benadryl [Diphenhydramine] Itching    Benadryl cream   Peppermint Flavor [Flavoring Agent]     Causes horsenss    Medications Reviewed Today     Reviewed by [name], FNP (Family Nurse Practitioner) on 05/13/22 at 61  Med List Status: <None>    Medication Order Taking? Sig Documenting Provider Last Dose Status Informant  0.9 %  sodium chloride infusion [id number]   Armbruster, Carlota Raspberry, MD  Active   Accu-Chek Softclix Lancets lancets [id number] No Use to check blood sugars twice daily E11.69  Patient not taking: Reported on 05/13/2022   [name], FNP Not Taking Active   acetaminophen (TYLENOL) 500 MG tablet [id number] Yes Take 500 mg by mouth every 6 (six) hours as needed. [provider] Taking Active   allopurinol (ZYLOPRIM) 100 MG tablet [id number] Yes TAKE 1 TABLET BY MOUTH EVERY DAY [name], FNP Taking Active   amLODipine (NORVASC) 10 MG tablet [id number] Yes TAKE 1 TABLET(10 MG) BY MOUTH DAILY Skeet Latch, MD Taking Active   aspirin EC 81 MG tablet [id number] Yes Take 1 tablet (81 mg total) by mouth daily. Swallow whole. Loel Dubonnet, NP Taking Active   Biotin 1000 MCG tablet [id number] Yes biotin [provider] Taking Active   blood glucose meter kit and supplies KIT [id number] No Dispense based on patient and insurance preference. Use up to two times daily as directed. (FOR ICD-9 250.00, 250.01).  Patient not taking: Reported on 05/13/2022   Rodriguez-[place], Sunday Spillers, PA-C Not Taking Active   buPROPion (WELLBUTRIN XL) 150 MG 24 hr tablet [id number] Yes TAKE 1 TABLET(150 MG) BY MOUTH EVERY MORNING [name], FNP Taking Active   carvedilol (COREG) 6.25 MG tablet [id number] Yes Please take 1.5 tablets twice daily Loel Dubonnet, NP Taking Active   Cholecalciferol (VITAMIN D3) 125 MCG (5000 UT) CAPS [id number] Yes Take 1 capsule by mouth daily at 12 noon. [provider] Taking Active   clobetasol (TEMOVATE) 0.05 % external solution [id number] Yes SMARTSIG:1 Sparingly Topical Twice Daily [provider] Taking Active   Continuous Blood Gluc Sensor (FREESTYLE LIBRE [place]) MISC [id number] No Use as directed to check blood sugars 2 times per day dx: e11.9  Patient not  taking: Reported on 05/13/2022   [name], FNP Not Taking Active   Garlic 7628 MG CAPS [id number] Yes Take 1 capsule by mouth daily. [provider] Taking Active   glucose blood (  ACCU-CHEK SMARTVIEW) test strip [id number] No Use to check blood sugars twice daily E11.69  Patient not taking: Reported on 05/13/2022   [name], FNP Not Taking Active   metFORMIN (GLUCOPHAGE-XR) 500 MG 24 hr tablet [id number] Yes Take 1 tablet (500 mg total) by mouth 2 (two) times daily. [name], FNP Taking Active   Multiple Vitamin (MULTIVITAMIN) capsule [id number] Yes Take 1 capsule by mouth daily. [provider] Taking Active   rosuvastatin (CRESTOR) 20 MG tablet [id number] Yes TAKE 1 TABLET(20 MG) BY MOUTH DAILY [name], FNP Taking Active   spironolactone (ALDACTONE) 50 MG tablet [id number] Yes Take 50 mg by mouth daily. [provider] Taking Active   valsartan-hydrochlorothiazide (DIOVAN-HCT) 160-25 MG tablet [id number] Yes Take 1 tablet by mouth daily. Skeet Latch, MD Taking Active             SDOH:  (Social Determinants of Health) assessments and interventions performed: No SDOH Interventions    Flowsheet Row Chronic Care Management from 04/30/2022 in [facility] Clinical Support from 08/16/2020 in [place] from 10/14/2018 in [facility]  SDOH Interventions     Housing Interventions --  Marlana Salvage is concerned about the moisture of the home and the age of the home] -- --  Depression Interventions/Treatment  -- -- PHQ2-9 Score <4 Follow-up Not Indicated  Financial Strain Interventions --  [Cost of medication] -- --  Physical Activity Interventions -- Other (Comments)  [Patient is engaged in health coaching to increase physical activity.] --       Medication Assistance: None required.  Patient affirms current coverage meets needs.  Medication Access: Within the  past 30 days, how often has patient missed a dose of medication? *** Is a pillbox or other method used to improve adherence? No  Factors that may affect medication adherence? {CHL DESC; BARRIERS:21522} Are meds synced by current pharmacy? {YES/NO:21197} Are meds delivered by current pharmacy? {YES/NO:21197} Does patient experience delays in picking up medications due to transportation concerns? No   [facility] Reviewed: Is patient disadvantaged to use [facility]?: No  Current Rx insurance plan: [facility] Medicare  Name and location of Current pharmacy:  [facility] [place], [place] - [address] [place] [address] Phone: [phone] Fax: [phone]  [id number] [place] [place]: [phone]: [phone] [facility] [address] [place]: [phone]: [phone]\ [facility] services reviewed with patient today?: {YES/NO:21197} Patient requests to transfer care to [facility]?: {YES/NO:21197} Reason patient declined to change pharmacies: {US patient preference:27474}  Compliance/Adherence/Medication fill history: Care Gaps: ***  Star-Rating Drugs: ***   Assessment/Plan   {CCM PHARMD DISEASE STATES:25130}  ***

## 2022-06-18 ENCOUNTER — Ambulatory Visit: Payer: Medicare PPO

## 2022-06-18 ENCOUNTER — Ambulatory Visit (INDEPENDENT_AMBULATORY_CARE_PROVIDER_SITE_OTHER): Payer: Medicare PPO | Admitting: Nurse Practitioner

## 2022-06-18 ENCOUNTER — Encounter: Payer: Self-pay | Admitting: Nurse Practitioner

## 2022-06-18 VITALS — BP 112/58 | HR 69 | Temp 97.9°F | Ht 65.0 in | Wt 186.0 lb

## 2022-06-18 DIAGNOSIS — I9589 Other hypotension: Secondary | ICD-10-CM | POA: Diagnosis not present

## 2022-06-18 DIAGNOSIS — L659 Nonscarring hair loss, unspecified: Secondary | ICD-10-CM

## 2022-06-18 MED ORDER — VALSARTAN-HYDROCHLOROTHIAZIDE 160-12.5 MG PO TABS: 1.0000 | ORAL_TABLET | Freq: Every day | ORAL | 1 refills | Status: DC

## 2022-06-18 NOTE — Progress Notes (Signed)
I,Jerl Munyan,acting as a Education administrator for [name], FNP.,have documented all relevant documentation on the behalf of [name], FNP,as directed by  [name], FNP while in the presence of [name], [place].    Subjective:     Patient ID: Jasmine Lopez , female    DOB: [birth year] , 82 y.o.   MRN: [id number]   Chief Complaint  Patient presents with   Hypotension    HPI  Patient presents today for concerns about her blood pressure being too low. She is now exercising 3 times a week after starting with PREP.  Her BP 100/60 after exercising, below normal for her. She has not been keeping her blood pressure journal. She has increased her water intake. She is getting in 48-50 oz water a day. She does report feeling like "she is moving more slowly".   Wt Readings from Last 3 Encounters: 06/18/22 : 186 lb (84.4 kg) 06/11/22 : 185 lb 6.4 oz (84.1 kg) 06/02/22 : 187 lb (84.8 kg)       Orthostatic Vitals for the past 48 hrs (Last 6 readings):  Patient Position Orthostatic BP  06/18/22 1209 Supine 112/52  06/18/22 1210 Sitting 118/62  06/18/22 1211 Standing 108/62  06/18/22 1212 Standing 108/62     Past Medical History:  Diagnosis Date   Anxiety    Breast cancer ([place])    Cancer ([place])    Diabetes mellitus without complication ([place])    Dysphagia    GERD (gastroesophageal reflux disease) 10/12/2020   Hair loss 10/12/2020   Heart murmur    Hx of adenomatous polyp of colon 06/2015   [name]- [facility]   Hypertension    Irregular heart beat    Sleep apnea      Family History  Problem Relation Age of Onset   Heart failure Mother        CAD   COPD Mother    Hypertension Mother    Cancer Father    Rectal cancer Maternal Grandmother        Unsure   Colon cancer Maternal Grandmother    Heart attack Maternal Grandfather    Esophageal cancer Neg Hx    Stomach cancer Neg Hx    Breast cancer Neg Hx      Current Outpatient Medications:    acetaminophen  (TYLENOL) 500 MG tablet, Take 500 mg by mouth every 6 (six) hours as needed., Disp: , Rfl:    albuterol (VENTOLIN HFA) 108 (90 Base) MCG/ACT inhaler, 1-2 puffs every 6 (six) hours as needed for wheezing or shortness of breath., Disp: , Rfl:    allopurinol (ZYLOPRIM) 100 MG tablet, TAKE 1 TABLET BY MOUTH EVERY DAY, Disp: 90 tablet, Rfl: 0   amLODipine (NORVASC) 10 MG tablet, TAKE 1 TABLET(10 MG) BY MOUTH DAILY, Disp: 90 tablet, Rfl: 2   aspirin EC 81 MG tablet, Take 1 tablet (81 mg total) by mouth daily. Swallow whole., Disp: 90 tablet, Rfl: 3   Biotin 1000 MCG tablet, biotin, Disp: , Rfl:    buPROPion (WELLBUTRIN XL) 150 MG 24 hr tablet, TAKE 1 TABLET(150 MG) BY MOUTH EVERY MORNING, Disp: 90 tablet, Rfl: 1   carvedilol (COREG) 6.25 MG tablet, Please take 1.5 tablets twice daily, Disp: 120 tablet, Rfl: 3   Cholecalciferol (VITAMIN D3) 125 MCG (5000 UT) CAPS, Take 1 capsule by mouth daily at 12 noon., Disp: , Rfl:    clobetasol (TEMOVATE) 0.05 % external solution, SMARTSIG:1 Sparingly Topical Twice Daily, Disp: , Rfl:  Garlic 123XX123 MG CAPS, Take 1 capsule by mouth daily., Disp: , Rfl:    guaiFENesin (MUCINEX) 600 MG 12 hr tablet, Take 1 tablet (600 mg total) by mouth 2 (two) times daily., Disp: 60 tablet, Rfl: 2   metFORMIN (GLUCOPHAGE-XR) 500 MG 24 hr tablet, Take 1 tablet (500 mg total) by mouth 2 (two) times daily., Disp: 180 tablet, Rfl: 1   Multiple Vitamin (MULTIVITAMIN) capsule, Take 1 capsule by mouth daily., Disp: , Rfl:    rosuvastatin (CRESTOR) 20 MG tablet, TAKE 1 TABLET(20 MG) BY MOUTH DAILY, Disp: 90 tablet, Rfl: 1   Semaglutide,0.25 or 0.5MG/DOS, (OZEMPIC, 0.25 OR 0.5 MG/DOSE,) 2 MG/1.5ML SOPN, Inject 0.5 mg into the skin once a week., Disp: 4.5 mL, Rfl: 1   spironolactone (ALDACTONE) 50 MG tablet, Take 50 mg by mouth daily., Disp: , Rfl:    valsartan-hydrochlorothiazide (DIOVAN HCT) 160-12.5 MG tablet, Take 1 tablet by mouth daily., Disp: 90 tablet, Rfl: 1   Accu-Chek Softclix  Lancets lancets, Use to check blood sugars twice daily E11.69 (Patient not taking: Reported on 05/13/2022), Disp: 100 each, Rfl: 2   blood glucose meter kit and supplies KIT, Dispense based on patient and insurance preference. Use up to two times daily as directed. (FOR ICD-9 250.00, 250.01). (Patient not taking: Reported on 05/13/2022), Disp: 1 each, Rfl: 0   Continuous Blood Gluc Sensor (FREESTYLE LIBRE 14 DAY SENSOR) MISC, Use as directed to check blood sugars 2 times per day dx: e11.9 (Patient not taking: Reported on 05/13/2022), Disp: 2 each, Rfl: 1   glucose blood (ACCU-CHEK SMARTVIEW) test strip, Use to check blood sugars twice daily E11.69 (Patient not taking: Reported on 05/13/2022), Disp: 100 each, Rfl: 2  Current Facility-Administered Medications:    0.9 %  sodium chloride infusion, 500 mL, Intravenous, Once, Armbruster, Carlota Raspberry, MD   Allergies  Allergen Reactions   Ace Inhibitors Cough   Atorvastatin Other (See Comments)    Leg pain    Benadryl [Diphenhydramine] Itching    Benadryl cream   Peppermint Flavor [Flavoring Agent]     Causes horsenss     Review of Systems  Constitutional: Negative.   Respiratory: Negative.    Cardiovascular: Negative.   Neurological: Negative.   Psychiatric/Behavioral: Negative.       Today's Vitals   06/18/22 1047  BP: (!) 112/58  Pulse: 69  Temp: 97.9 F (36.6 C)  TempSrc: Oral  SpO2: 98%  Weight: 186 lb (84.4 kg)  Height: 5' 5"$  (1.651 m)   Body mass index is 30.95 kg/m.   Objective:  Physical Exam Vitals reviewed.  Constitutional:      General: She is not in acute distress.    Appearance: Normal appearance. She is obese.  Cardiovascular:     Rate and Rhythm: Normal rate and regular rhythm.     Pulses: Normal pulses.     Heart sounds: Normal heart sounds. No murmur heard. Pulmonary:     Effort: Pulmonary effort is normal. No respiratory distress.     Breath sounds: Normal breath sounds. No wheezing.  Skin:    General: Skin is  warm and dry.     Capillary Refill: Capillary refill takes less than 2 seconds.  Neurological:     General: No focal deficit present.     Mental Status: She is alert and oriented to person, place, and time.     Cranial Nerves: No cranial nerve deficit.     Motor: No weakness.  Assessment And Plan:     1. Other specified hypotension Comments: History of hypertension but since eating better and exercising her blood pressure is lower, will decrease her dose of valsartan/HCTZ - 160/12.5. Will check orthostats.  - valsartan-hydrochlorothiazide (DIOVAN HCT) 160-12.5 MG tablet; Take 1 tablet by mouth daily.  Dispense: 90 tablet; Refill: 1  2. Hair loss Comments: She has been on spironolactone 50 mg by the Dermatologist, she would like a new referral will send to Dr Milford Cage office     Patient was given opportunity to ask questions. Patient verbalized understanding of the plan and was able to repeat key elements of the plan. All questions were answered to their satisfaction.  [name], FNP   I, [name], FNP, have reviewed all documentation for this visit. The documentation on 06/18/22 for the exam, diagnosis, procedures, and orders are all accurate and complete.   IF YOU HAVE BEEN REFERRED TO A SPECIALIST, IT MAY TAKE 1-2 WEEKS TO SCHEDULE/PROCESS THE REFERRAL. IF YOU HAVE NOT HEARD FROM US/SPECIALIST IN TWO WEEKS, PLEASE GIVE [place] A CALL AT [phone] X [phone].   THE PATIENT IS ENCOURAGED TO PRACTICE SOCIAL DISTANCING DUE TO THE COVID-19 PANDEMIC.

## 2022-06-18 NOTE — Patient Instructions (Signed)
Will decrease valsartan 160/HCTZ 25 to 160/12.5

## 2022-06-18 NOTE — Progress Notes (Signed)
Care Management & Coordination Services Pharmacy Note  06/18/2022 Name:  Jasmine Lopez MRN:  [id number] DOB:  09/15/1940  Summary: Patient reports being more tired and she is concerned about her BP.   Recommendations/Changes made from today's visit: Recommend reviewing current medication regimen to assess BP readings due to addition of Spironolactone for hair growth and low reading at PREP visit last week.  Recommend patient have in person visit, and BP readings are completed, po Recommend patient is seen by PCP due to low BP readings.   Follow up plan over the next 30 days : Review current BP regimen to identify next steps for her regimen with the addition of spironolactone for hair growth.  Collaborated with patient and determined that she was open to reviewing her medication and would like to be seen in person. PCP determined patient to be seen in office and had availability today. Patient to come for office visit today.  Follow up on possible medication changes based on patients BP reading.  Recommend potassium lab to be completed.    Subjective: Jasmine Lopez is an 82 y.o. year old female who is a primary patient of [name], [place].  The care coordination team was consulted for assistance with disease management and care coordination needs.    Engaged with patient by telephone for follow up visit.  Recent office visits: 05/13/2021 PCP OV  Recent consult visits: 04/09/2022 [facility] visits: None in previous 6 months   Objective:  Lab Results  Component Value Date   CREATININE 0.80 05/13/2022   BUN 10 05/13/2022   EGFR 75 05/13/2022   GFRNONAA 63 06/25/2020   GFRAA 72 06/25/2020   NA 138 05/13/2022   K 5.0 05/13/2022   CALCIUM 9.6 05/13/2022   CO2 24 05/13/2022   GLUCOSE 163 (H) 05/13/2022    Lab Results  Component Value Date/Time   HGBA1C 7.9 (H) 05/13/2022 10:42 AM   HGBA1C 7.5 (H) 11/05/2021 10:05 AM   MICROALBUR 10 12/26/2020 10:51 AM    MICROALBUR 10 06/25/2020 11:44 AM    Last diabetic Eye exam:  Lab Results  Component Value Date/Time   HMDIABEYEEXA No Retinopathy 12/26/2021 12:00 AM    Last diabetic Foot exam: No results found for: "HMDIABFOOTEX"   Lab Results  Component Value Date   CHOL 134 05/13/2022   HDL 47 05/13/2022   LDLCALC 72 05/13/2022   TRIG 76 05/13/2022   CHOLHDL 2.9 05/13/2022       Latest Ref Rng & Units 05/13/2022   10:42 AM 11/05/2021   10:05 AM 06/06/2021    2:21 PM  Hepatic Function  Total Protein 6.0 - 8.5 g/dL 6.7  6.6  7.3   Albumin 3.8 - 4.8 g/dL 4.3  4.2  4.7   AST 0 - 40 IU/L '16  18  18   '$ ALT 0 - 32 IU/L '21  16  21   '$ Alk Phosphatase 44 - 121 IU/L 55  63  76   Total Bilirubin 0.0 - 1.2 mg/dL 0.3  0.2  0.2     Lab Results  Component Value Date/Time   TSH 1.370 01/24/2022 03:26 PM   TSH 1.910 10/12/2020 09:17 AM   FREET4 1.06 01/20/2019 12:55 PM       Latest Ref Rng & Units 05/13/2022   10:42 AM 11/05/2021   10:05 AM 12/26/2020    2:19 PM  CBC  WBC 3.4 - 10.8 x10E3/uL 4.2  4.4  5.2   Hemoglobin 11.1 -  15.9 g/dL 14.2  13.4  14.0   Hematocrit 34.0 - 46.6 % 44.1  37.7  42.4   Platelets 150 - 450 x10E3/uL 338  217  213     Lab Results  Component Value Date/Time   VD25OH 54.9 08/01/2019 03:47 PM   VITAMINB12 670 05/17/2019 02:04 PM    Clinical ASCVD: No  The 10-year ASCVD risk score (Arnett DK, et al., 2019) is: 20.2%   Values used to calculate the score:     Age: 25 years     Sex: Female     Is Non-Hispanic African American: Yes     Diabetic: Yes     Tobacco smoker: No     Systolic Blood Pressure: 242 mmHg     Is BP treated: Yes     HDL Cholesterol: 47 mg/dL     Total Cholesterol: 134 mg/dL        05/13/2022    9:40 AM 01/06/2022    9:30 AM 12/04/2021    9:21 AM  Depression screen PHQ 2/9  Decreased Interest 0 0 0  Down, Depressed, Hopeless 0 0 0  PHQ - 2 Score 0 0 0     Social History   Tobacco Use  Smoking Status Never  Smokeless Tobacco Never   BP  Readings from Last 3 Encounters:  06/11/22 100/60  05/13/22 128/78  03/10/22 129/69   Pulse Readings from Last 3 Encounters:  06/11/22 73  05/13/22 62  03/10/22 62   Wt Readings from Last 3 Encounters:  06/11/22 185 lb 6.4 oz (84.1 kg)  06/02/22 187 lb (84.8 kg)  05/19/22 185 lb 6.4 oz (84.1 kg)   BMI Readings from Last 3 Encounters:  06/11/22 30.85 kg/m  06/02/22 31.12 kg/m  05/19/22 30.85 kg/m    Allergies  Allergen Reactions   Ace Inhibitors Cough   Atorvastatin Other (See Comments)    Leg pain    Benadryl [Diphenhydramine] Itching    Benadryl cream   Peppermint Flavor [Flavoring Agent]     Causes horsenss    Medications Reviewed Today     Reviewed by [name], FNP (Family Nurse Practitioner) on 05/13/22 at 74  Med List Status: <None>   Medication Order Taking? Sig Documenting Provider Last Dose Status Informant  0.9 %  sodium chloride infusion [id number]   Armbruster, Carlota Raspberry, MD  Active   Accu-Chek Softclix Lancets lancets [id number] No Use to check blood sugars twice daily E11.69  Patient not taking: Reported on 05/13/2022   [name], FNP Not Taking Active   acetaminophen (TYLENOL) 500 MG tablet [id number] Yes Take 500 mg by mouth every 6 (six) hours as needed. [provider] Taking Active   allopurinol (ZYLOPRIM) 100 MG tablet [id number] Yes TAKE 1 TABLET BY MOUTH EVERY DAY [name], FNP Taking Active   amLODipine (NORVASC) 10 MG tablet [id number] Yes TAKE 1 TABLET(10 MG) BY MOUTH DAILY Skeet Latch, MD Taking Active   aspirin EC 81 MG tablet [id number] Yes Take 1 tablet (81 mg total) by mouth daily. Swallow whole. Loel Dubonnet, NP Taking Active   Biotin 1000 MCG tablet [id number] Yes biotin [provider] Taking Active   blood glucose meter kit and supplies KIT [id number] No Dispense based on patient and insurance preference. Use up to two times daily as directed. (FOR ICD-9 250.00, 250.01).  Patient not taking: Reported  on 05/13/2022   Rodriguez-SouthworthSunday Spillers, PA-C Not Taking Active   buPROPion (WELLBUTRIN XL) 150 MG 24 hr tablet [id number]  Yes TAKE 1 TABLET(150 MG) BY MOUTH EVERY MORNING [name], FNP Taking Active   carvedilol (COREG) 6.25 MG tablet [id number] Yes Please take 1.5 tablets twice daily Loel Dubonnet, NP Taking Active   Cholecalciferol (VITAMIN D3) 125 MCG (5000 UT) CAPS [id number] Yes Take 1 capsule by mouth daily at 12 noon. [provider] Taking Active   clobetasol (TEMOVATE) 0.05 % external solution [id number] Yes SMARTSIG:1 Sparingly Topical Twice Daily [provider] Taking Active   Continuous Blood Gluc Sensor (FREESTYLE LIBRE [place]) MISC [id number] No Use as directed to check blood sugars 2 times per day dx: e11.9  Patient not taking: Reported on 05/13/2022   [name], FNP Not Taking Active   Garlic 0177 MG CAPS [id number] Yes Take 1 capsule by mouth daily. [provider] Taking Active   glucose blood (ACCU-CHEK SMARTVIEW) test strip [id number] No Use to check blood sugars twice daily E11.69  Patient not taking: Reported on 05/13/2022   [name], FNP Not Taking Active   metFORMIN (GLUCOPHAGE-XR) 500 MG 24 hr tablet [id number] Yes Take 1 tablet (500 mg total) by mouth 2 (two) times daily. [name], FNP Taking Active   Multiple Vitamin (MULTIVITAMIN) capsule [id number] Yes Take 1 capsule by mouth daily. [provider] Taking Active   rosuvastatin (CRESTOR) 20 MG tablet [id number] Yes TAKE 1 TABLET(20 MG) BY MOUTH DAILY [name], FNP Taking Active   spironolactone (ALDACTONE) 50 MG tablet [id number] Yes Take 50 mg by mouth daily. [provider] Taking Active   valsartan-hydrochlorothiazide (DIOVAN-HCT) 160-25 MG tablet [id number] Yes Take 1 tablet by mouth daily. Skeet Latch, MD Taking Active             SDOH:  (Social Determinants of Health) assessments and interventions performed: No SDOH Interventions     Flowsheet Row Chronic Care Management from 04/30/2022 in [facility] Clinical Support from 08/16/2020 in [facility] from 10/14/2018 in [facility]  SDOH Interventions     Housing Interventions --  Marlana Salvage is concerned about the moisture of the home and the age of the home] -- --  Depression Interventions/Treatment  -- -- PHQ2-9 Score <4 Follow-up Not Indicated  Financial Strain Interventions --  [Cost of medication] -- --  Physical Activity Interventions -- Other (Comments)  [Patient is engaged in health coaching to increase physical activity.] --       Medication Assistance: None required.  Patient affirms current coverage meets needs.  Medication Access: Within the past 30 days, how often has patient missed a dose of medication? She is missing a dose of Metformin daily Is a pillbox or other method used to improve adherence? Yes  Factors that may affect medication adherence? nonadherence to medications and lack of understanding of disease management Are meds synced by current pharmacy? No  Are meds delivered by current pharmacy? Yes  Does patient experience delays in picking up medications due to transportation concerns? No   [facility] Services Reviewed: Is patient disadvantaged to use [facility]?: Yes  Current Rx insurance plan: [facility] Medicare Name and location of Current pharmacy:  [facility] [place] - [address] [place] [address] Phone: [phone] Fax: [phone]  [id number] [id number]: [phone]: [phone] [facility] [place] [address] [place]  [place] [address] Phone: [phone] Fax: [phone]  [facility] services reviewed with patient today?:  No  Patient requests to transfer care to [facility]?: No  Reason patient declined to change pharmacies: Disadvantaged due to insurance/mail order  Compliance/Adherence/Medication fill history: Care Gaps: Pneumonia Vaccine  COVID-19 Vaccine   Star-Rating Drugs: Metformin XR 500 MG tablet  Spironolactone 50 mg tablet     Assessment/Plan   Hypertension (BP goal <130/80) -Controlled -Current treatment: Amlodipine 10 mg tablet once per day Appropriate, Effective, Query Safe,  Spironolactone 50 mg tablet once per day Appropriate, Effective, Safe, Accessible  Written by dermatologist for hair loss Carvedilol 6.25 mg tablet twice per day Appropriate, Effective, Query Safe,  Valsartan-hydrochlorothiazide 160-25 mg tablet once per day Appropriate, Effective, Query Safe,  -Current home readings: she is coming in today for follow up  -Current dietary habits: will discuss further during next visit -Current exercise habits: She is doing at least 20 minutes of activity three times per week at the [facility], she also walks on Saturday Mornings to walk for an hour from 10-11.  -Denies hypotensive/hypertensive symptoms -Educated on BP goals and benefits of medications for prevention of heart attack, stroke and kidney damage; Importance of home blood pressure monitoring; Symptoms of hypotension and importance of maintaining adequate hydration; -She is doing exercise at the senior center at [place] center she does chair Yoga, and other programs. She also does programs at the [facility] center where she goes to [facility] class that she goes to sometimes.  -Per patient the last day at the [place] program patient reports that her BP was lower then usual at 100/60 and it might be a good time to talk with the PCP about changing her current regimen -She reports that after she takes her BP she starts to feel weak sometimes.  -Counseled to monitor BP at home at least three time per week, document, and provide  log at future appointments -Collaborated with PCP to recommend patient be seen due to low BP readings. Will recommend that patients current BP medication be changed possibly by decreasing the amlodipine or hctz due to patients current use of spironolactone 50 mg tablet for hair gain.   Will also recommend patients potassium be checked every three months.    Diabetes (A1c goal <7%) -Uncontrolled -Current medications: Ozempic 0.25 mg once per week for 1 month. Appropriate, Query effective,  Patient has not started the Ozempic due to cost Metformin XR 500 mg tablet twice per day Appropriate, Query effective,  She was only taking one tablet daily  -Current home glucose readings: she reports her BS are up and down, and they are not regulated.  fasting glucose: no numbers given will review during another time -Denies hypoglycemic/hyperglycemic symptoms -Current exercise: she is currently exercising at the [facility] center. -Educated on A1c and blood sugar goals; Complications of diabetes including kidney damage, retinal damage, and cardiovascular disease; Exercise goal of 150 minutes per week; -Counseled to check feet daily and get yearly eye exams -Counseled on the importance of taking her Metformin at the same time each day at one time per day with the largest meal of the day Collaborated with PCP and patient to review patient assistance  Assessed patient finances. Uncertain of eligibility but will still review to see if possible.   Orlando Penner, CPP, PharmD Clinical Pharmacist Practitioner [facility] [phone]

## 2022-06-30 ENCOUNTER — Other Ambulatory Visit: Payer: Self-pay | Admitting: Nurse Practitioner

## 2022-07-04 ENCOUNTER — Encounter: Payer: Medicare PPO | Admitting: Nurse Practitioner

## 2022-07-04 NOTE — Progress Notes (Deleted)
Patient presents today for a bp check, patient currently taking amlodipine '10mg'$ , spironolactone '50mg'$ , valsartan-hydrochlorothiazide 160-12.5

## 2022-07-28 ENCOUNTER — Ambulatory Visit (INDEPENDENT_AMBULATORY_CARE_PROVIDER_SITE_OTHER): Payer: Medicare PPO | Admitting: Podiatry

## 2022-07-28 ENCOUNTER — Encounter: Payer: Self-pay | Admitting: Podiatry

## 2022-07-28 VITALS — BP 145/60

## 2022-07-28 DIAGNOSIS — M2141 Flat foot [pes planus] (acquired), right foot: Secondary | ICD-10-CM

## 2022-07-28 DIAGNOSIS — M79674 Pain in right toe(s): Secondary | ICD-10-CM | POA: Diagnosis not present

## 2022-07-28 DIAGNOSIS — E1169 Type 2 diabetes mellitus with other specified complication: Secondary | ICD-10-CM | POA: Diagnosis not present

## 2022-07-28 DIAGNOSIS — B351 Tinea unguium: Secondary | ICD-10-CM | POA: Diagnosis not present

## 2022-07-28 DIAGNOSIS — M2142 Flat foot [pes planus] (acquired), left foot: Secondary | ICD-10-CM

## 2022-07-28 DIAGNOSIS — E785 Hyperlipidemia, unspecified: Secondary | ICD-10-CM

## 2022-07-28 DIAGNOSIS — M79675 Pain in left toe(s): Secondary | ICD-10-CM | POA: Diagnosis not present

## 2022-07-28 NOTE — Progress Notes (Unsigned)
  Subjective:  Patient ID: Jasmine Lopez, female    DOB: [birth year],  MRN: [id number]  Jasmine Lopez presents to clinic today for {jgcomplaint:23593}  Chief Complaint  Patient presents with   Nail Problem    [facility] BS-do not check daily A1C-7.6 PCP-Moore, Janece PCP VST- 1 month ago   New problem(s): None. {jgcomplaint:23593}  PCP is [name], FNP.  Allergies  Allergen Reactions   Ace Inhibitors Cough   Atorvastatin Other (See Comments)    Leg pain    Benadryl [Diphenhydramine] Itching    Benadryl cream   Peppermint Flavor [Flavoring Agent]     Causes horsenss    Review of Systems: Negative except as noted in the HPI.  Objective: No changes noted in today's physical examination. Vitals:   07/28/22 1514  BP: (!) 145/60   Jasmine Lopez is a pleasant 82 y.o. female {jgbodyhabitus:24098} AAO x 3.  Vascular Examination: Capillary refill time <3 seconds b/l. Vascular status intact b/l with palpable pedal pulses. Pedal hair present b/l. No edema. No pain with calf compression b/l. Skin temperature gradient WNL b/l.   Neurological Examination: Sensation grossly intact b/l with 10 gram monofilament. Vibratory sensation intact b/l.   Dermatological Examination: Pedal skin with normal turgor, texture and tone b/l. Toenails 1-5 b/l thick, discolored, elongated with subungual debris and pain on dorsal palpation. No open wounds b/l LE. No interdigital macerations noted b/l LE.  Musculoskeletal Examination: Normal muscle strength 5/5 to all lower extremity muscle groups bilaterally. No pain, crepitus or joint limitation noted with ROM b/l LE. No gross bony pedal deformities b/l. Patient ambulates independently without assistive aids.  Assessment/Plan: 1. Pain due to onychomycosis of toenails of both feet   2. Type 2 diabetes mellitus with hyperlipidemia (HCC)     No orders of the defined types were placed in this encounter.   None {Jgplan:23602::"-Patient/POA to  call should there be question/concern in the interim."}   Return in about 3 months (around 10/28/2022).  Marzetta Board, DPM

## 2022-08-05 ENCOUNTER — Other Ambulatory Visit: Payer: Self-pay | Admitting: Cardiovascular Disease

## 2022-08-06 NOTE — Telephone Encounter (Signed)
Rx request sent to pharmacy.  

## 2022-08-11 ENCOUNTER — Encounter (HOSPITAL_BASED_OUTPATIENT_CLINIC_OR_DEPARTMENT_OTHER): Payer: Self-pay | Admitting: Cardiovascular Disease

## 2022-08-11 ENCOUNTER — Ambulatory Visit (INDEPENDENT_AMBULATORY_CARE_PROVIDER_SITE_OTHER): Payer: Medicare PPO | Admitting: Cardiovascular Disease

## 2022-08-11 VITALS — BP 120/64 | HR 62 | Ht 65.0 in | Wt 191.0 lb

## 2022-08-11 DIAGNOSIS — I471 Supraventricular tachycardia, unspecified: Secondary | ICD-10-CM

## 2022-08-11 DIAGNOSIS — E669 Obesity, unspecified: Secondary | ICD-10-CM | POA: Diagnosis not present

## 2022-08-11 DIAGNOSIS — E66811 Obesity, class 1: Secondary | ICD-10-CM

## 2022-08-11 DIAGNOSIS — I10 Essential (primary) hypertension: Secondary | ICD-10-CM

## 2022-08-11 HISTORY — DX: Supraventricular tachycardia, unspecified: I47.10

## 2022-08-11 NOTE — Patient Instructions (Signed)
Medication Instructions:  Your physician has recommended you make the following change in your medication:  Stop Amlodipine  *If you need a refill on your cardiac medications before your next appointment, please call your pharmacy*   Follow-Up: At [facility], you and your health needs are our priority.  As part of our continuing mission to provide you with exceptional heart care, we have created designated Provider Care Teams.  These Care Teams include your primary Cardiologist (physician) and Advanced Practice Providers (APPs -  Physician Assistants and Nurse Practitioners) who all work together to provide you with the care you need, when you need it.  We recommend signing up for the patient portal called "MyChart".  Sign up information is provided on this After Visit Summary.  MyChart is used to connect with patients for Virtual Visits (Telemedicine).  Patients are able to view lab/test results, encounter notes, upcoming appointments, etc.  Non-urgent messages can be sent to your provider as well.   To learn more about what you can do with MyChart, go to [email or web address].    Your next appointment:   6 months with Laurann Montana, NP & 1 year with Dr. Oval Linsey  Other Instructions Exercise recommendations: The American Heart Association recommends 150 minutes of moderate intensity exercise weekly. Try 30 minutes of moderate intensity exercise 4-5 times per week. This could include walking, jogging, or swimming.

## 2022-08-11 NOTE — Progress Notes (Signed)
Cardiology Office Note  Date:  08/11/2022   ID:  Jasmine Lopez Nov 28, 1940, MRN [id number]  PCP:  [name], FNP  Cardiologist:   Skeet Latch, MD   No chief complaint on file.   History of Present Illness: Jasmine Lopez is a 82 y.o. female with hypertension, breast cancer, and diabetes here for follow up.  She was initially seen 10/2017 for heart murmur.  Jasmine Lopez recently moved to [place] from [place].  Her husband passed 02/2017 then her mother passed 06/2017.  She moved to be closer to her sons.  She established care with her PCP and was noted to have a murmur.  Jasmine Lopez was seeing a cardiologist in [place] due to poorly controlled hypertension.  She had a stress test around 2017 that was reportedly negative for ischemia.  She was started on metoprolol which helped her to feel less short of breath.  She was referred for an echocardiogram 11/17/2017 that revealed LVEF 65 to 70% and no LVH or intracavitary gradient.  She had no valvular heart disease. Her BP was above goal so amlodipine was added.  She followed up with Kerin Ransom on 06/2018 and her BP was still elevated.  She had a hard time cutting chlorthalidone in half so it was discontinued.  Metoprolol was switched to carvedilol due to bradycardia.  Her BP was a little elevated 02/2019, but this was 2/2 steroid use and she was only taking carvedilol once daily.  She struggled with cough and has been diagnosed with vocal cord dysfunction.  She gets rehab and has GERD that is being treated.  This has helped her cough.  She has noted significant improvement in the symptoms.  She occasionally has heartburn symptoms that occur when she eats a heavy meal and lays down.  Otherwise it is well-controlled.  She has no heartburn with exertion.  Jasmine Lopez blood pressure was poorly controlled.  Amlodipine was increased to 10 mg.  Blood pressure was 140/68 when she followed up with our pharmacist.  Losartan was switched to valsartan.   Since then her blood pressure has been well controlled.  She was also referred to the PREP program at the [facility].  She wants to participate but feels uncomfortable driving around town.  She had some word finding difficulty and was concerned about Alzheimer's.  She was referred to neurology and had an MRI of the brain that was unremarkable, showing only mild age-appropriate changes of chronic small vessel disease and supratentorial cortical atrophy.  She was started back on Wellbutrin and feels that her symptoms have improved significantly.  At her last appointment she was making some improvements with the help of her health coach.  Her blood pressure was slightly above goal but she wanted to keep working on diet and exercise instead of adding any new medications.  She complained of hair loss and her TSH was normal.  At her last appointment she continued to work on her diet and exercise. She had enjoyed the PREP program. Home blood pressures were well controlled in the [id number] systolic. She called the office reporting syncope and collapse on 11/01/21 resulting in 2 black eyes. She had been evaluated by neurology 11/06/21 and was recommended for CT head and EEG as she reported a sweet smell prior to her syncopal episode. CT head 11/08/2027 with mild generalized cortical atrophy which is likely typical for age, few foci of hypoattenuation in hemispheres consistent with mild chronic microvascular ischemic change typical for age, and  no acute findings. She was seen in follow-up by Laurann Montana, NP 11/13/2021 and it was felt her syncope was due to dehydration. A 14-day Zio was placed in clinic and was notable for 58 episodes of SVT versus atrial tachycardia, longest 30 seconds. Recommended to increase Coreg to 1.5 tablets BID. She had an echo 11/2021 revealing LVEF 58%, mild LVH, and normal diastolic parameters. Carotid dopplers 11/2021 showed mild stenosis of the right ICA. We recommended adding aspirin EC 81 mg  daily.  Today, she reports about 2 months ago she experienced low blood pressures such as 111-116/60 which concerned her. She was also feeling drowsy. She believes she was taking too much medication and she had seen dermatology around that time and was given spironolactone. Currently she notices that her BP is back into the 120s-130 on average (been a while since it was [id number] systolic). Of note, she has been without amlodipine since 05/2022. Usually she now takes her antihypertensives in the afternoons between lunch and dinner. Lately her palpitations have significantly improved. Additionally she complains that her weight is terrible (currently 191 lbs in clinic). Previously her weight was as low as 181 lbs. She has not started her Ozempic due to high costs. For a time she had fallen out of the habit of exercising, but has recently been working on this. Also she plans to begin taking a Biotin & Collagen mixture which is added to her drinks. She denies any chest pain, shortness of breath, or peripheral edema. No lightheadedness, headaches, syncope, orthopnea, or PND.   Past Medical History:  Diagnosis Date   Anxiety    Breast cancer    Cancer    Diabetes mellitus without complication    Dysphagia    GERD (gastroesophageal reflux disease) 10/12/2020   Hair loss 10/12/2020   Heart murmur    Hx of adenomatous polyp of colon 06/2015   [name]- [facility]   Hypertension    Irregular heart beat    Sleep apnea    SVT (supraventricular tachycardia) 08/11/2022    Past Surgical History:  Procedure Laterality Date   BREAST EXCISIONAL BIOPSY Bilateral    BREAST LUMPECTOMY Left 2016     Current Outpatient Medications  Medication Sig Dispense Refill   Accu-Chek Softclix Lancets lancets Use to check blood sugars twice daily E11.69 (Patient not taking: Reported on 05/13/2022) 100 each 2   acetaminophen (TYLENOL) 500 MG tablet Take 500 mg by mouth every 6 (six) hours as needed.     albuterol  (VENTOLIN HFA) 108 (90 Base) MCG/ACT inhaler 1-2 puffs every 6 (six) hours as needed for wheezing or shortness of breath.     allopurinol (ZYLOPRIM) 100 MG tablet TAKE 1 TABLET BY MOUTH EVERY DAY 90 tablet 0   aspirin EC 81 MG tablet Take 1 tablet (81 mg total) by mouth daily. Swallow whole. 90 tablet 3   Biotin 1000 MCG tablet biotin     blood glucose meter kit and supplies KIT Dispense based on patient and insurance preference. Use up to two times daily as directed. (FOR ICD-9 250.00, 250.01). (Patient not taking: Reported on 05/13/2022) 1 each 0   carvedilol (COREG) 6.25 MG tablet Please take 1.5 tablets twice daily 120 tablet 3   Cholecalciferol (VITAMIN D3) 125 MCG (5000 UT) CAPS Take 1 capsule by mouth daily at 12 noon.     clobetasol (TEMOVATE) 0.05 % external solution SMARTSIG:1 Sparingly Topical Twice Daily     Continuous Blood Gluc Sensor (FREESTYLE LIBRE 14  DAY SENSOR) MISC Use as directed to check blood sugars 2 times per day dx: e11.9 (Patient not taking: Reported on 99991111) 2 each 1   Garlic 123XX123 MG CAPS Take 1 capsule by mouth daily.     glucose blood (ACCU-CHEK SMARTVIEW) test strip Use to check blood sugars twice daily E11.69 (Patient not taking: Reported on 05/13/2022) 100 each 2   guaiFENesin (MUCINEX) 600 MG 12 hr tablet Take 1 tablet (600 mg total) by mouth 2 (two) times daily. 60 tablet 2   metFORMIN (GLUCOPHAGE-XR) 500 MG 24 hr tablet Take 1 tablet (500 mg total) by mouth 2 (two) times daily. 180 tablet 1   Multiple Vitamin (MULTIVITAMIN) capsule Take 1 capsule by mouth daily.     rosuvastatin (CRESTOR) 20 MG tablet TAKE 1 TABLET(20 MG) BY MOUTH DAILY 90 tablet 1   Semaglutide,0.25 or 0.5MG /DOS, (OZEMPIC, 0.25 OR 0.5 MG/DOSE,) 2 MG/1.5ML SOPN Inject 0.5 mg into the skin once a week. 4.5 mL 1   spironolactone (ALDACTONE) 50 MG tablet Take 50 mg by mouth daily.     valsartan-hydrochlorothiazide (DIOVAN HCT) 160-12.5 MG tablet Take 1 tablet by mouth daily. 90 tablet 1   Current  Facility-Administered Medications  Medication Dose Route Frequency Provider Last Rate Last Admin   0.9 %  sodium chloride infusion  500 mL Intravenous Once Armbruster, Carlota Raspberry, MD        Allergies:   Ace inhibitors, Atorvastatin, Benadryl [diphenhydramine], and Peppermint flavor [flavoring agent]    Social History:  The patient  reports that she has never smoked. She has never used smokeless tobacco. She reports that she does not drink alcohol and does not use drugs.   Family History:  The patient's family history includes COPD in her mother; Cancer in her father; Colon cancer in her maternal grandmother; Heart attack in her maternal grandfather; Heart failure in her mother; Hypertension in her mother; Rectal cancer in her maternal grandmother.    ROS:   Please see the history of present illness. All other systems are reviewed and negative.    PHYSICAL EXAM:  VS:  BP 120/64   Pulse 62   Ht 5\' 5"  (1.651 m)   Wt 191 lb (86.6 kg)   BMI 31.78 kg/m  , BMI Body mass index is 31.78 kg/m. GENERAL:  Well appearing HEENT: Pupils equal round and reactive, fundi not visualized, oral mucosa unremarkable NECK:  No jugular venous distention, waveform within normal limits, carotid upstroke brisk and symmetric, no bruits LUNGS:  Clear to auscultation bilaterally HEART:  RRR.  PMI not displaced or sustained,S1 and S2 within normal limits, no S3, no S4, no clicks, no rubs, II/VI systolic murmur at the LUSB ABD:  Flat, positive bowel sounds normal in frequency in pitch, no bruits, no rebound, no guarding, no midline pulsatile mass, no hepatomegaly, no splenomegaly EXT:  2 plus pulses throughout, no edema, no cyanosis no clubbing SKIN:  No rashes no nodules NEURO:  Cranial nerves II through XII grossly intact, motor grossly intact throughout PSYCH:  Cognitively intact, oriented to person place and time   EKG:   EKG is personally reviewed. 08/11/2022:  EKG was not ordered. 06/17/2021: Sinus rhythm.   67 bpm.  PACs. 10/12/2020: Sinus bradycardia. Rate 58 bpm, 1st degree AV block, low voltage.  08/04/2019: EKG was not ordered. 04/05/19: Sinus bradycardia.  Rate 58 bpm.  Nonspecific T wave abnormalities 02/09/18: Sinus bradycardia.  Rate 54 bpm.  Nonspecific T wave abnormalities.   Monitor  11/2021: 14-day Zio  monitor   Quality: Fair.  Baseline artifact. Predominant rhythm: Sinus rhythm Average heart rate: 69 bpm Max heart rate: 126 bpm Min heart rate: 46 bpm Pauses >2.5 seconds: None   58 episodes of SVT versus atrial tachycardia.  Max heart rate 193 bpm.  Longest episode 30 seconds.  Average heart rate 111 bpm. 2.2% PACs Rare PVCs.   Patient recorded symptoms at times correlating with SVT.  Bilateral Carotid Dopplers  11/27/2021: Summary:  Right Carotid: Velocities in the right ICA are consistent with a 1-39%  stenosis.   Left Carotid: The extracranial vessels were near-normal with only minimal  wall thickening or plaque.   Vertebrals:  Bilateral vertebral arteries demonstrate antegrade flow.  Subclavians: Normal flow hemodynamics were seen in bilateral subclavian               arteries.   Echo  11/27/2021:  1. Left ventricular ejection fraction by 3D volume is 58 %. The left  ventricle has normal function. The left ventricle has no regional wall  motion abnormalities. There is mild concentric left ventricular  hypertrophy. Left ventricular diastolic  parameters were normal.   2. Right ventricular systolic function is normal. The right ventricular  size is normal.   3. The mitral valve is normal in structure. No evidence of mitral valve  regurgitation. No evidence of mitral stenosis.   4. The aortic valve is normal in structure. Aortic valve regurgitation is  not visualized. No aortic stenosis is present.   5. The inferior vena cava is normal in size with greater than 50%  respiratory variability, suggesting right atrial pressure of 3 mmHg.   Echo 11/17/17: Study  Conclusions   - Left ventricle: The cavity size was normal. Systolic function was   hyperdynamic. The estimated ejection fraction was in the range of   70% to 75%. Wall motion was normal; there were no regional wall   motion abnormalities. Features are consistent with a pseudonormal   left ventricular filling pattern, with concomitant abnormal   relaxation and increased filling pressure (grade 2 diastolic   dysfunction). - Mitral valve: Valve area by pressure half-time: 2.22 cm^2. - Left atrium: The atrium was moderately dilated. - Pulmonary arteries: PA peak pressure: 31 mm Hg (S).  Recent Labs: 01/24/2022: TSH 1.370 05/13/2022: ALT 21; BUN 10; Creatinine, Ser 0.80; Hemoglobin 14.2; Platelets 338; Potassium 5.0; Sodium 138   10/15/2017: TSH 2.06, free T4 1.09 Hemoglobin A1c 6.6% Sodium 141, potassium 3.9, BUN 11, creatinine 0.83  01/21/2018: Total cholesterol 198, triglycerides 101, HDL 55, LDL 123  Lipid Panel    Component Value Date/Time   CHOL 134 05/13/2022 1042   TRIG 76 05/13/2022 1042   HDL 47 05/13/2022 1042   CHOLHDL 2.9 05/13/2022 1042   LDLCALC 72 05/13/2022 1042      Wt Readings from Last 3 Encounters:  08/11/22 191 lb (86.6 kg)  06/18/22 186 lb (84.4 kg)  06/11/22 185 lb 6.4 oz (84.1 kg)      ASSESSMENT AND PLAN:  Essential hypertension BP has been well-controlled.  She ran out of amlodipine and BP remains controlled.  Will continue her current regimen of carvedilol, spironolactone and valsartan/HCTZ.    SVT (supraventricular tachycardia) Palpitations are much better since increasing carvedilol.  Obesity (BMI 30.0-34.9) She was encouraged to get back into her exercise routine of exercising at least 150 minutes weekly.  Continue working on limiting carbohydrate intake.  She has a prescription for Ozempic but has not yet filled it.  She wanted  to get my opinion.  I told her that this is a very good medication and that I agree with her PCP that she should try  it.  She is going to work on getting the patient assistance forms.  Disposition:    FU with APP in 6 months. FU with Wilbon Obenchain C. Oval Linsey, MD, [facility] in 12 months.  Medication Adjustments/Labs and Tests Ordered: Current medicines are reviewed at length with the patient today.  Concerns regarding medicines are outlined above.   No orders of the defined types were placed in this encounter.  No orders of the defined types were placed in this encounter.  Patient Instructions  Medication Instructions:  Your physician has recommended you make the following change in your medication:  Stop Amlodipine  *If you need a refill on your cardiac medications before your next appointment, please call your pharmacy*   Follow-Up: At [facility], you and your health needs are our priority.  As part of our continuing mission to provide you with exceptional heart care, we have created designated Provider Care Teams.  These Care Teams include your primary Cardiologist (physician) and Advanced Practice Providers (APPs -  Physician Assistants and Nurse Practitioners) who all work together to provide you with the care you need, when you need it.  We recommend signing up for the patient portal called "MyChart".  Sign up information is provided on this After Visit Summary.  MyChart is used to connect with patients for Virtual Visits (Telemedicine).  Patients are able to view lab/test results, encounter notes, upcoming appointments, etc.  Non-urgent messages can be sent to your provider as well.   To learn more about what you can do with MyChart, go to [email or web address].    Your next appointment:   6 months with Laurann Montana, NP & 1 year with Dr. Oval Linsey  Other Instructions Exercise recommendations: The American Heart Association recommends 150 minutes of moderate intensity exercise weekly. Try 30 minutes of moderate intensity exercise 4-5 times per week. This could include walking, jogging,  or swimming.     I,Mathew Stumpf,acting as a Education administrator for Skeet Latch, MD.,have documented all relevant documentation on the behalf of Skeet Latch, MD,as directed by  Skeet Latch, MD while in the presence of Skeet Latch, MD.  I, [place] Oval Linsey, MD have reviewed all documentation for this visit.  The documentation of the exam, diagnosis, procedures, and orders on 08/11/2022 are all accurate and complete.   Signed, Tenecia Ignasiak C. Oval Linsey, MD, [facility]  08/11/2022 9:30 AM    [facility]

## 2022-08-11 NOTE — Assessment & Plan Note (Signed)
She was encouraged to get back into her exercise routine of exercising at least 150 minutes weekly.  Continue working on limiting carbohydrate intake.  She has a prescription for Ozempic but has not yet filled it.  She wanted to get my opinion.  I told her that this is a very good medication and that I agree with her PCP that she should try it.  She is going to work on getting the patient assistance forms.

## 2022-08-11 NOTE — Assessment & Plan Note (Signed)
Palpitations are much better since increasing carvedilol.

## 2022-08-11 NOTE — Assessment & Plan Note (Signed)
BP has been well-controlled.  She ran out of amlodipine and BP remains controlled.  Will continue her current regimen of carvedilol, spironolactone and valsartan/HCTZ.

## 2022-08-13 ENCOUNTER — Ambulatory Visit (INDEPENDENT_AMBULATORY_CARE_PROVIDER_SITE_OTHER): Payer: Medicare PPO

## 2022-08-13 DIAGNOSIS — E1169 Type 2 diabetes mellitus with other specified complication: Secondary | ICD-10-CM

## 2022-08-13 DIAGNOSIS — E785 Hyperlipidemia, unspecified: Secondary | ICD-10-CM

## 2022-08-13 DIAGNOSIS — M2142 Flat foot [pes planus] (acquired), left foot: Secondary | ICD-10-CM

## 2022-08-13 DIAGNOSIS — M2141 Flat foot [pes planus] (acquired), right foot: Secondary | ICD-10-CM

## 2022-08-13 NOTE — Progress Notes (Signed)
Patient presents to the office today for diabetic shoe and insole measuring.  Patient was measured with brannock device to determine size and width for 1 pair of extra depth shoes and foam casted for 3 pair of insoles.   ABN signed.   Documentation of medical necessity will be sent to patient's treating diabetic doctor to verify and sign.   Patient's diabetic provider: Glendale Chard, MD  Shoes and insoles will be ordered at that time and patient will be notified for an appointment for fitting when they arrive.   Brannock measurement: 8.5W  Patient shoe selection-   1st   Shoe choice:   [id number] NEW BALANCE  Shoe size ordered: 8.5W

## 2022-08-17 NOTE — Progress Notes (Signed)
No Show

## 2022-08-20 ENCOUNTER — Telehealth: Payer: Self-pay | Admitting: Pharmacist

## 2022-08-20 NOTE — Progress Notes (Signed)
Outreached patient to discuss medication management and adherence, as per insurance, patient failed Medication Adherence in Hypertension, Medication Adherence in Diabetes, and Medication Adherence in Cholesterol measures for 2023.   Left voicemail for her to return my call at her convenience.   Catie Eppie Gibson, PharmD, BCACP, CPP [facility] [phone]

## 2022-08-27 ENCOUNTER — Other Ambulatory Visit (HOSPITAL_COMMUNITY): Payer: Self-pay

## 2022-08-27 ENCOUNTER — Other Ambulatory Visit: Payer: Medicare PPO | Admitting: Pharmacist

## 2022-08-27 NOTE — Progress Notes (Signed)
08/27/2022 Name: SHAHEEN STAR MRN: [id number] DOB: [birth year]  Chief Complaint  Patient presents with   Medication Management   Hypertension   Hyperlipidemia   Diabetes    NOVALYNN BRANAMAN is a 82 y.o. year old female who presented for a telephone visit.   They were referred to the pharmacist by a quality report for assistance in managing medication access and complex medication management.   Outreached patient to discuss medication management and adherence, as per insurance, patient failed Medication Adherence in Hypertension, Medication Adherence in Diabetes, and Medication Adherence in Cholesterol measures for 2023.     Subjective:  Care Team: Primary Care Provider: [name], FNP ; Next Scheduled Visit: 09/11/22  Medication Access/Adherence  Current Pharmacy:  [id number] [place], [place] - [address] AT [facility] [address] [place] [address] Phone: [phone] Fax: [phone]  [facility] - [address], [place] - [address] [place] [address] Phone: [phone] Fax: [phone]   Patient reports affordability concerns with their medications: No  Patient reports access/transportation concerns to their pharmacy: Yes  Patient reports adherence concerns with their medications:  Yes     Diabetes:  Current medications: metformin XR 500 mg twice daily - notes she often misses this evening dose; also notes fecal urgency after taking metformin. Has mostly been taking 1 dose in the morning; Ozempic - was originally quoted a price over $1000 for Ozempic   Hypertension:  Current medications: valsartan/HCTZ 160/12.5 mg daily, spironolactone 50 mg daily, carvedilol 6.25 mg twice daily - has only been taking 1 twice daily; amlodipine 10 mg daily  Upon medication review today, we realized she got allopurinol and amlodipine confused when she was talking to Dr. Duke Salvia. She actually ran out  of allopurinol about a month ago, she has continued to take amlodipine 10 mg daily   BP and heart rate were well controlled when she last saw Dr. Duke Salvia. Notes she has not noticed much impact with alopecia since starting spironolactone, so plans to discuss discontinuation with Dermatology when she next sees them.   Hyperlipidemia/ASCVD Risk Reduction  Current lipid lowering medications: rosuvastatin 20 mg daily - notes she had a supply of 10 mg tablets at home and 20 mg tablets that she has been working through completing. She is completing a supply of 10 mg tablets by taking 2 daily.   Antiplatelet regimen: aspirin - but notes she only takes as needed for aches   Gout: Current medications: prescribed allopurinol, but has not filled for a few months. Does note more joint aches lately. Does not recall when her last gout flare was.   Objective:  Lab Results  Component Value Date   HGBA1C 7.9 (H) 05/13/2022    Lab Results  Component Value Date   CREATININE 0.80 05/13/2022   BUN 10 05/13/2022   NA 138 05/13/2022   K 5.0 05/13/2022   CL 98 05/13/2022   CO2 24 05/13/2022    Lab Results  Component Value Date   CHOL 134 05/13/2022   HDL 47 05/13/2022   LDLCALC 72 05/13/2022   TRIG 76 05/13/2022   CHOLHDL 2.9 05/13/2022    Medications Reviewed Today     Reviewed by Alden Hipp, RPH-CPP (Pharmacist) on 08/27/22 at 1057  Med List Status: <None>   Medication Order Taking? Sig Documenting Provider Last Dose Status Informant  0.9 %  sodium chloride infusion [id number]   Armbruster, Willaim Rayas, MD  Consider Medication Status and Discontinue   Accu-Chek Softclix Lancets lancets [id number]  Use to check blood sugars twice daily E11.69  Patient not taking: Reported on 05/13/2022   [name], FNP  Active   acetaminophen (TYLENOL) 500 MG tablet [id number] No Take 500 mg by mouth every 6 (six) hours as needed.  Patient not taking: Reported on 08/27/2022   [provider]  Not Taking Active   albuterol (VENTOLIN HFA) 108 (90 Base) MCG/ACT inhaler [id number] No 1-2 puffs every 6 (six) hours as needed for wheezing or shortness of breath.  Patient not taking: Reported on 08/27/2022   [provider] Not Taking Active   allopurinol (ZYLOPRIM) 100 MG tablet [id number]  TAKE 1 TABLET BY MOUTH EVERY DAY [name], FNP  Active   Biotin 1000 MCG tablet [id number] Yes Take 1,000 mcg by mouth daily. [provider] Taking Active   blood glucose meter kit and supplies KIT [id number]  Dispense based on patient and insurance preference. Use up to two times daily as directed. (FOR ICD-9 250.00, 250.01).  Patient not taking: Reported on 05/13/2022   Rodriguez-Southworth, Nettie Elm, PA-C  Active   carvedilol (COREG) 6.25 MG tablet [id number] Yes Please take 1.5 tablets twice daily Alver Sorrow, NP Taking Active   Cholecalciferol (VITAMIN D3) 125 MCG (5000 UT) CAPS [id number] Yes Take 1 capsule by mouth daily. [provider] Taking Active   Garlic 1000 MG CAPS [id number]  Take 1 capsule by mouth daily. [provider]  Active   glucose blood (ACCU-CHEK SMARTVIEW) test strip [id number]  Use to check blood sugars twice daily E11.69  Patient not taking: Reported on 05/13/2022   [name], FNP  Active   metFORMIN (GLUCOPHAGE-XR) 500 MG 24 hr tablet [id number] Yes Take 1 tablet (500 mg total) by mouth 2 (two) times daily. [name], FNP Taking Active            Med Note Clearance Coots, Thoams Siefert T   Wed Aug 27, 2022 10:33 AM) Taking 1-2 daily  Multiple Vitamin (MULTIVITAMIN) capsule [id number] Yes Take 1 capsule by mouth daily. [provider] Taking Active   rosuvastatin (CRESTOR) 20 MG tablet [id number] Yes TAKE 1 TABLET(20 MG) BY MOUTH DAILY [name], FNP Taking Active   Semaglutide,0.25 or 0.5MG /DOS, (OZEMPIC, 0.25 OR 0.5 MG/DOSE,) 2 MG/1.5ML SOPN [id number] No Inject 0.5 mg into the skin once a week.  Patient not taking: Reported on  08/27/2022   [name], FNP Not Taking Active   spironolactone (ALDACTONE) 50 MG tablet [id number] Yes Take 50 mg by mouth daily. [provider] Taking Active   valsartan-hydrochlorothiazide (DIOVAN HCT) 160-12.5 MG tablet [id number] Yes Take 1 tablet by mouth daily. [name], FNP Taking Active               Assessment/Plan:   Diabetes: - Currently uncontrolled - Given fecal urgency with evening doses of metformin, recommend to reduce metformin to 1 XR 500 mg tablet daily. - Discussed income. Patient is over the income limit for Ozempic assistance. She has changed insurances since she was originally quoted [employer], so will investigate price now. Attempted to complete PA; per Cover My Meds, PA is not needed. Discussed with pharmacy team for test claim. Patient has a deductible so her first fill, whether 1 month or 3 months, would be including her $545 deductible, so first fill would be over $600. Cost after that would be either $47/month or $141/90 days. Given total cost of drug, patient very likely to  hit Coverage Gap in the next few months if Ozempic was started. This would be the same for SGLT2. Discussed with patient. She will think about it and contact the pharmacy to fill Ozempic if she decided she is interested.  - Patient educated to complete current metformin supply, then notify us for an updated prescription for metformin XR 500 mg once daily to avoid impact on adherence measure.   Hypertension: - Currently controlled - Will communicate patient's current home regimen of carvedilol 6.25 mg twice daily and amlodipine 10 mg daily (alongside valsartan/HCTZ) to Dr. Duke Salvia - Discussed strategies to focus on adherence to evening dose of carvedilol, such as setting an alarm. She already uses a pill box.   Hyperlipidemia/ASCVD Risk Reduction: - Currently controlled.  - Recommend to continue current supply of rosuvastatin, then fill refill for rosuvastatin 20 mg  daily tablet to avoid impact on adherence measures.   Follow Up Plan: phone call in 6 weeks  Catie TClearance Coots, PharmD, BCACP, CPP [facility] [phone]

## 2022-08-27 NOTE — Patient Instructions (Addendum)
Jasmine Lopez,   It was great talking to you today!  Here is how I recommend you fill your pill box:  Morning: - Metformin XR 500 mg daily (diabetes) - complete current supply THEN we'll send a new script for 1 tablet daily - Valsartan/HCTZ 160/12.5 mg (blood pressure) - Amlodipine 10 mg daily (blood pressure) - Carvedilol 6.25 mg (blood pressure) - Rosuvastatin 20 mg (cholesterol) - complete 10 mg tablets THEN fill prescription for 20 mg tablets - Allopurinol 100 mg (gout) - we'll get this refilled for you  Evening: - Carvedilol 6.25 mg (blood pressure)

## 2022-09-01 MED ORDER — METFORMIN HCL ER 500 MG PO TB24: 500.0000 mg | ORAL_TABLET | Freq: Every day | ORAL | 1 refills | Status: DC

## 2022-09-01 MED ORDER — ALLOPURINOL 100 MG PO TABS: 100.0000 mg | ORAL_TABLET | Freq: Every day | ORAL | 1 refills | Status: DC

## 2022-09-01 NOTE — Progress Notes (Signed)
Discussed with PCP. Refill for allopurinol placed and reducing metformin to 1 tablet daily.   Catie Eppie Gibson, PharmD, BCACP, CPP [facility] [phone]

## 2022-09-01 NOTE — Addendum Note (Signed)
Addended by: Alden Hipp on: 09/01/2022 08:54 AM   Modules accepted: Orders

## 2022-09-02 DIAGNOSIS — M9903 Segmental and somatic dysfunction of lumbar region: Secondary | ICD-10-CM | POA: Diagnosis not present

## 2022-09-02 DIAGNOSIS — M9905 Segmental and somatic dysfunction of pelvic region: Secondary | ICD-10-CM | POA: Diagnosis not present

## 2022-09-02 DIAGNOSIS — M47817 Spondylosis without myelopathy or radiculopathy, lumbosacral region: Secondary | ICD-10-CM | POA: Diagnosis not present

## 2022-09-02 DIAGNOSIS — M9902 Segmental and somatic dysfunction of thoracic region: Secondary | ICD-10-CM | POA: Diagnosis not present

## 2022-09-02 DIAGNOSIS — M5136 Other intervertebral disc degeneration, lumbar region: Secondary | ICD-10-CM | POA: Diagnosis not present

## 2022-09-02 DIAGNOSIS — M47814 Spondylosis without myelopathy or radiculopathy, thoracic region: Secondary | ICD-10-CM | POA: Diagnosis not present

## 2022-09-02 DIAGNOSIS — M5032 Other cervical disc degeneration, mid-cervical region, unspecified level: Secondary | ICD-10-CM | POA: Diagnosis not present

## 2022-09-02 DIAGNOSIS — M9901 Segmental and somatic dysfunction of cervical region: Secondary | ICD-10-CM | POA: Diagnosis not present

## 2022-09-04 DIAGNOSIS — M9903 Segmental and somatic dysfunction of lumbar region: Secondary | ICD-10-CM | POA: Diagnosis not present

## 2022-09-04 DIAGNOSIS — M5032 Other cervical disc degeneration, mid-cervical region, unspecified level: Secondary | ICD-10-CM | POA: Diagnosis not present

## 2022-09-04 DIAGNOSIS — M9901 Segmental and somatic dysfunction of cervical region: Secondary | ICD-10-CM | POA: Diagnosis not present

## 2022-09-04 DIAGNOSIS — M5136 Other intervertebral disc degeneration, lumbar region: Secondary | ICD-10-CM | POA: Diagnosis not present

## 2022-09-04 DIAGNOSIS — M47817 Spondylosis without myelopathy or radiculopathy, lumbosacral region: Secondary | ICD-10-CM | POA: Diagnosis not present

## 2022-09-04 DIAGNOSIS — M9905 Segmental and somatic dysfunction of pelvic region: Secondary | ICD-10-CM | POA: Diagnosis not present

## 2022-09-04 DIAGNOSIS — M47814 Spondylosis without myelopathy or radiculopathy, thoracic region: Secondary | ICD-10-CM | POA: Diagnosis not present

## 2022-09-04 DIAGNOSIS — M9902 Segmental and somatic dysfunction of thoracic region: Secondary | ICD-10-CM | POA: Diagnosis not present

## 2022-09-09 DIAGNOSIS — M5136 Other intervertebral disc degeneration, lumbar region: Secondary | ICD-10-CM | POA: Diagnosis not present

## 2022-09-09 DIAGNOSIS — M5032 Other cervical disc degeneration, mid-cervical region, unspecified level: Secondary | ICD-10-CM | POA: Diagnosis not present

## 2022-09-09 DIAGNOSIS — M9902 Segmental and somatic dysfunction of thoracic region: Secondary | ICD-10-CM | POA: Diagnosis not present

## 2022-09-09 DIAGNOSIS — M9901 Segmental and somatic dysfunction of cervical region: Secondary | ICD-10-CM | POA: Diagnosis not present

## 2022-09-09 DIAGNOSIS — M9903 Segmental and somatic dysfunction of lumbar region: Secondary | ICD-10-CM | POA: Diagnosis not present

## 2022-09-09 DIAGNOSIS — M47814 Spondylosis without myelopathy or radiculopathy, thoracic region: Secondary | ICD-10-CM | POA: Diagnosis not present

## 2022-09-09 DIAGNOSIS — M9905 Segmental and somatic dysfunction of pelvic region: Secondary | ICD-10-CM | POA: Diagnosis not present

## 2022-09-09 DIAGNOSIS — M47817 Spondylosis without myelopathy or radiculopathy, lumbosacral region: Secondary | ICD-10-CM | POA: Diagnosis not present

## 2022-09-11 ENCOUNTER — Ambulatory Visit (INDEPENDENT_AMBULATORY_CARE_PROVIDER_SITE_OTHER): Payer: Medicare PPO | Admitting: Nurse Practitioner

## 2022-09-11 ENCOUNTER — Encounter: Payer: Self-pay | Admitting: Nurse Practitioner

## 2022-09-11 VITALS — BP 140/60 | HR 85 | Temp 98.3°F | Ht 65.0 in | Wt 184.2 lb

## 2022-09-11 DIAGNOSIS — W19XXXA Unspecified fall, initial encounter: Secondary | ICD-10-CM

## 2022-09-11 DIAGNOSIS — E6609 Other obesity due to excess calories: Secondary | ICD-10-CM | POA: Diagnosis not present

## 2022-09-11 DIAGNOSIS — Z6830 Body mass index (BMI) 30.0-30.9, adult: Secondary | ICD-10-CM

## 2022-09-11 DIAGNOSIS — M791 Myalgia, unspecified site: Secondary | ICD-10-CM

## 2022-09-11 DIAGNOSIS — E1169 Type 2 diabetes mellitus with other specified complication: Secondary | ICD-10-CM

## 2022-09-11 DIAGNOSIS — Z2821 Immunization not carried out because of patient refusal: Secondary | ICD-10-CM | POA: Diagnosis not present

## 2022-09-11 DIAGNOSIS — M5136 Other intervertebral disc degeneration, lumbar region: Secondary | ICD-10-CM | POA: Diagnosis not present

## 2022-09-11 DIAGNOSIS — E785 Hyperlipidemia, unspecified: Secondary | ICD-10-CM

## 2022-09-11 DIAGNOSIS — M47817 Spondylosis without myelopathy or radiculopathy, lumbosacral region: Secondary | ICD-10-CM | POA: Diagnosis not present

## 2022-09-11 DIAGNOSIS — M5032 Other cervical disc degeneration, mid-cervical region, unspecified level: Secondary | ICD-10-CM | POA: Diagnosis not present

## 2022-09-11 DIAGNOSIS — I10 Essential (primary) hypertension: Secondary | ICD-10-CM

## 2022-09-11 DIAGNOSIS — M9902 Segmental and somatic dysfunction of thoracic region: Secondary | ICD-10-CM | POA: Diagnosis not present

## 2022-09-11 DIAGNOSIS — M47814 Spondylosis without myelopathy or radiculopathy, thoracic region: Secondary | ICD-10-CM | POA: Diagnosis not present

## 2022-09-11 DIAGNOSIS — M9903 Segmental and somatic dysfunction of lumbar region: Secondary | ICD-10-CM | POA: Diagnosis not present

## 2022-09-11 DIAGNOSIS — M9905 Segmental and somatic dysfunction of pelvic region: Secondary | ICD-10-CM | POA: Diagnosis not present

## 2022-09-11 DIAGNOSIS — M9901 Segmental and somatic dysfunction of cervical region: Secondary | ICD-10-CM | POA: Diagnosis not present

## 2022-09-11 LAB — BMP8+EGFR
BUN/Creatinine Ratio: 17 (ref 12–28)
BUN: 15 mg/dL (ref 8–27)
CO2: 22 mmol/L (ref 20–29)
Calcium: 9.7 mg/dL (ref 8.7–10.3)
Chloride: 101 mmol/L (ref 96–106)
Creatinine, Ser: 0.88 mg/dL (ref 0.57–1.00)
Glucose: 173 mg/dL — ABNORMAL HIGH (ref 70–99)
Potassium: 4.4 mmol/L (ref 3.5–5.2)
Sodium: 138 mmol/L (ref 134–144)
eGFR: 66 mL/min/{1.73_m2} (ref >59)

## 2022-09-11 LAB — HEMOGLOBIN A1C
Est. average glucose Bld gHb Est-mCnc: 183 mg/dL
Hgb A1c MFr Bld: 8 % — ABNORMAL HIGH (ref 4.8–5.6)

## 2022-09-11 NOTE — Progress Notes (Signed)
Hershal Coria Martin,acting as a Neurosurgeon for [name], FNP.,have documented all relevant documentation on the behalf of [name], FNP,as directed by  [name], FNP while in the presence of [name], FNP.    Subjective:     Patient ID: Jasmine Lopez , female    DOB: 1940-07-11 , 82 y.o.   MRN: [id number]   Chief Complaint  Patient presents with   Hypertension   Diabetes    HPI  Patient presents today for DM and BP, patient states compliance with medications. She has not started her Ozempic  Patient reports she recently hit her knee on the ground in a "almost fall" she reports a pain in her lower left buttock since then this happened about the 2 weeks ago. She had a rock in her bedroom shoe while walking outside. She reports the pain is much better now but she still feels it. She has been seeing a chiropractor who has been massaging the area.   BP Readings from Last 3 Encounters: 09/11/22 : (!) 140/60 08/11/22 : 120/64 07/28/22 : (!) 145/60    Hypertension This is a chronic problem. The current episode started more than 1 year ago. The problem is unchanged. The problem is controlled. Pertinent negatives include no anxiety, chest pain, headaches or shortness of breath. Risk factors for coronary artery disease include obesity. Past treatments include calcium channel blockers. There are no compliance problems.  There is no history of angina. There is no history of chronic renal disease.  Diabetes She presents for her follow-up diabetic visit. She has type 2 diabetes mellitus. Her disease course has been stable. Pertinent negatives for hypoglycemia include no dizziness or headaches. Pertinent negatives for diabetes include no chest pain, no fatigue, no polydipsia, no polyphagia and no polyuria. There are no hypoglycemic complications. There are no diabetic complications. Risk factors for coronary artery disease include obesity, hypertension and dyslipidemia. Current diabetic  treatment includes oral agent (monotherapy). She is compliant with treatment most of the time. She is following a generally healthy (she reads her labels ) diet. When asked about meal planning, she reported none. She has not had a previous visit with a dietitian. She participates in exercise intermittently. (Averaging 107- 130. ) An ACE inhibitor/angiotensin II receptor blocker is being taken. She does not see a podiatrist.Eye exam is current.     Past Medical History:  Diagnosis Date   Anxiety    Breast cancer (HCC)    Cancer (HCC)    Diabetes mellitus without complication (HCC)    Dysphagia    GERD (gastroesophageal reflux disease) 10/12/2020   Hair loss 10/12/2020   Heart murmur    Hx of adenomatous polyp of colon 06/2015   Dr. Hyman [facility]   Hypertension    Irregular heart beat    Sleep apnea    SVT (supraventricular tachycardia) 08/11/2022     Family History  Problem Relation Age of Onset   Heart failure Mother        CAD   COPD Mother    Hypertension Mother    Cancer Father    Rectal cancer Maternal Grandmother        Unsure   Colon cancer Maternal Grandmother    Heart attack Maternal Grandfather    Esophageal cancer Neg Hx    Stomach cancer Neg Hx    Breast cancer Neg Hx      Current Outpatient Medications:    allopurinol (ZYLOPRIM) 100 MG tablet, Take 1 tablet (100  mg total) by mouth daily., Disp: 90 tablet, Rfl: 1   amLODipine (NORVASC) 5 MG tablet, Take 5 mg by mouth daily., Disp: , Rfl:    Biotin 1000 MCG tablet, Take 1,000 mcg by mouth daily., Disp: , Rfl:    carvedilol (COREG) 6.25 MG tablet, Please take 1.5 tablets twice daily, Disp: 120 tablet, Rfl: 3   Cholecalciferol (VITAMIN D3) 125 MCG (5000 UT) CAPS, Take 1 capsule by mouth daily., Disp: , Rfl:    Garlic 1000 MG CAPS, Take 1 capsule by mouth daily., Disp: , Rfl:    metFORMIN (GLUCOPHAGE-XR) 500 MG 24 hr tablet, Take 1 tablet (500 mg total) by mouth daily., Disp: 90 tablet, Rfl: 1    Multiple Vitamin (MULTIVITAMIN) capsule, Take 1 capsule by mouth daily., Disp: , Rfl:    rosuvastatin (CRESTOR) 20 MG tablet, TAKE 1 TABLET(20 MG) BY MOUTH DAILY, Disp: 90 tablet, Rfl: 1   spironolactone (ALDACTONE) 50 MG tablet, Take 50 mg by mouth daily., Disp: , Rfl:    valsartan-hydrochlorothiazide (DIOVAN HCT) 160-12.5 MG tablet, Take 1 tablet by mouth daily., Disp: 90 tablet, Rfl: 1   Accu-Chek Softclix Lancets lancets, Use to check blood sugars twice daily E11.69 (Patient not taking: Reported on 05/13/2022), Disp: 100 each, Rfl: 2   acetaminophen (TYLENOL) 500 MG tablet, Take 500 mg by mouth every 6 (six) hours as needed. (Patient not taking: Reported on 08/27/2022), Disp: , Rfl:    albuterol (VENTOLIN HFA) 108 (90 Base) MCG/ACT inhaler, 1-2 puffs every 6 (six) hours as needed for wheezing or shortness of breath. (Patient not taking: Reported on 08/27/2022), Disp: , Rfl:    blood glucose meter kit and supplies KIT, Dispense based on patient and insurance preference. Use up to two times daily as directed. (FOR ICD-9 250.00, 250.01). (Patient not taking: Reported on 05/13/2022), Disp: 1 each, Rfl: 0   glucose blood (ACCU-CHEK SMARTVIEW) test strip, Use to check blood sugars twice daily E11.69 (Patient not taking: Reported on 05/13/2022), Disp: 100 each, Rfl: 2   Semaglutide,0.25 or 0.5MG /DOS, (OZEMPIC, 0.25 OR 0.5 MG/DOSE,) 2 MG/1.5ML SOPN, Inject 0.5 mg into the skin once a week. (Patient not taking: Reported on 08/27/2022), Disp: 4.5 mL, Rfl: 1  Current Facility-Administered Medications:    0.9 %  sodium chloride infusion, 500 mL, Intravenous, Once, Armbruster, Willaim Rayas, MD   Allergies  Allergen Reactions   Ace Inhibitors Cough   Atorvastatin Other (See Comments)    Leg pain    Benadryl [Diphenhydramine] Itching    Benadryl cream   Peppermint Flavor [Flavoring Agent]     Causes horsenss     Review of Systems  Constitutional: Negative.  Negative for fatigue.  HENT: Negative.    Eyes:  Negative.   Respiratory: Negative.  Negative for shortness of breath.   Cardiovascular: Negative.  Negative for chest pain.  Gastrointestinal: Negative.   Endocrine: Negative for polydipsia, polyphagia and polyuria.  Neurological:  Negative for dizziness and headaches.     Today's Vitals   09/11/22 1025  BP: (!) 140/60  Pulse: 85  Temp: 98.3 F (36.8 C)  TempSrc: Oral  Weight: 184 lb 3.2 oz (83.6 kg)  Height: 5\' 5"  (1.651 m)  PainSc: 0-No pain   Body mass index is 30.65 kg/m.  Wt Readings from Last 3 Encounters:  09/11/22 184 lb 3.2 oz (83.6 kg)  08/11/22 191 lb (86.6 kg)  06/18/22 186 lb (84.4 kg)    Objective:  Physical Exam Vitals reviewed.  Constitutional:  General: She is not in acute distress.    Appearance: Normal appearance. She is obese.  Cardiovascular:     Rate and Rhythm: Normal rate and regular rhythm.     Pulses: Normal pulses.     Heart sounds: Normal heart sounds. No murmur heard. Pulmonary:     Effort: Pulmonary effort is normal. No respiratory distress.     Breath sounds: Normal air entry. No wheezing or rales.  Skin:    General: Skin is warm and dry.     Capillary Refill: Capillary refill takes less than 2 seconds.  Neurological:     General: No focal deficit present.     Mental Status: She is alert and oriented to person, place, and time.     Cranial Nerves: No cranial nerve deficit.     Motor: No weakness.         Assessment And Plan:     1. Essential hypertension Comments: Blood pressure is slightly elevated, advised to make sure she is taking her medications as directed. - BMP8+eGFR  2. Type 2 diabetes mellitus with hyperlipidemia (HCC) Comments: HgbA1c was slightly elevated at last visit. Will check HgbA1c. Continue current medications - Hemoglobin A1c  3. Herpes zoster vaccination declined Declines shingrix, educated on disease process and is aware if he changes his mind to notify office  4. Class 1 obesity due to excess  calories with serious comorbidity and body mass index (BMI) of 30.0 to 30.9 in adult She is encouraged to strive for BMI less than 30 to decrease cardiac risk. Advised to aim for at least 150 minutes of exercise per week.  5. COVID-19 vaccine dose declined Declines covid 19 vaccine. Discussed risk of covid 48 and if she changes her mind about the vaccine to call the office. Education has been provided regarding the importance of this vaccine but patient still declined. Advised may receive this vaccine at local pharmacy or Health Dept.or vaccine clinic. Aware to provide a copy of the vaccination record if obtained from local pharmacy or Health Dept.  Encouraged to take multivitamin, vitamin d, vitamin c and zinc to increase immune system. Aware can call office if would like to have vaccine here at office. Verbalized acceptance and understanding.  6. Fall, initial encounter Comments: Advised to avoid wearing loose slippers and avoid throw rugs.  7. Muscle pain Comments: Likely related to recent fall. Continue going to Chiropractor and can use Salonpas patches    Patient was given opportunity to ask questions. Patient verbalized understanding of the plan and was able to repeat key elements of the plan. All questions were answered to their satisfaction.  [name], FNP   I, [name], FNP, have reviewed all documentation for this visit. The documentation on 09/11/22 for the exam, diagnosis, procedures, and orders are all accurate and complete.   IF YOU HAVE BEEN REFERRED TO A SPECIALIST, IT MAY TAKE 1-2 WEEKS TO SCHEDULE/PROCESS THE REFERRAL. IF YOU HAVE NOT HEARD FROM US/SPECIALIST IN TWO WEEKS, PLEASE GIVE [place] A CALL AT [phone] X [phone].   THE PATIENT IS ENCOURAGED TO PRACTICE SOCIAL DISTANCING DUE TO THE COVID-19 PANDEMIC.

## 2022-09-11 NOTE — Patient Instructions (Addendum)

## 2022-09-16 DIAGNOSIS — M47814 Spondylosis without myelopathy or radiculopathy, thoracic region: Secondary | ICD-10-CM | POA: Diagnosis not present

## 2022-09-16 DIAGNOSIS — M5032 Other cervical disc degeneration, mid-cervical region, unspecified level: Secondary | ICD-10-CM | POA: Diagnosis not present

## 2022-09-16 DIAGNOSIS — M9903 Segmental and somatic dysfunction of lumbar region: Secondary | ICD-10-CM | POA: Diagnosis not present

## 2022-09-16 DIAGNOSIS — M9901 Segmental and somatic dysfunction of cervical region: Secondary | ICD-10-CM | POA: Diagnosis not present

## 2022-09-16 DIAGNOSIS — M9902 Segmental and somatic dysfunction of thoracic region: Secondary | ICD-10-CM | POA: Diagnosis not present

## 2022-09-16 DIAGNOSIS — M9905 Segmental and somatic dysfunction of pelvic region: Secondary | ICD-10-CM | POA: Diagnosis not present

## 2022-09-16 DIAGNOSIS — M5136 Other intervertebral disc degeneration, lumbar region: Secondary | ICD-10-CM | POA: Diagnosis not present

## 2022-09-16 DIAGNOSIS — M47817 Spondylosis without myelopathy or radiculopathy, lumbosacral region: Secondary | ICD-10-CM | POA: Diagnosis not present

## 2022-09-18 DIAGNOSIS — R070 Pain in throat: Secondary | ICD-10-CM | POA: Diagnosis not present

## 2022-09-18 DIAGNOSIS — R051 Acute cough: Secondary | ICD-10-CM | POA: Diagnosis not present

## 2022-09-18 DIAGNOSIS — R0982 Postnasal drip: Secondary | ICD-10-CM | POA: Diagnosis not present

## 2022-09-18 DIAGNOSIS — R0981 Nasal congestion: Secondary | ICD-10-CM | POA: Diagnosis not present

## 2022-09-18 DIAGNOSIS — B370 Candidal stomatitis: Secondary | ICD-10-CM | POA: Diagnosis not present

## 2022-09-19 DIAGNOSIS — M5136 Other intervertebral disc degeneration, lumbar region: Secondary | ICD-10-CM | POA: Diagnosis not present

## 2022-09-19 DIAGNOSIS — M47814 Spondylosis without myelopathy or radiculopathy, thoracic region: Secondary | ICD-10-CM | POA: Diagnosis not present

## 2022-09-19 DIAGNOSIS — M9902 Segmental and somatic dysfunction of thoracic region: Secondary | ICD-10-CM | POA: Diagnosis not present

## 2022-09-19 DIAGNOSIS — M47817 Spondylosis without myelopathy or radiculopathy, lumbosacral region: Secondary | ICD-10-CM | POA: Diagnosis not present

## 2022-09-19 DIAGNOSIS — M5032 Other cervical disc degeneration, mid-cervical region, unspecified level: Secondary | ICD-10-CM | POA: Diagnosis not present

## 2022-09-19 DIAGNOSIS — M9901 Segmental and somatic dysfunction of cervical region: Secondary | ICD-10-CM | POA: Diagnosis not present

## 2022-09-19 DIAGNOSIS — M9905 Segmental and somatic dysfunction of pelvic region: Secondary | ICD-10-CM | POA: Diagnosis not present

## 2022-09-19 DIAGNOSIS — M9903 Segmental and somatic dysfunction of lumbar region: Secondary | ICD-10-CM | POA: Diagnosis not present

## 2022-09-25 DIAGNOSIS — M5032 Other cervical disc degeneration, mid-cervical region, unspecified level: Secondary | ICD-10-CM | POA: Diagnosis not present

## 2022-09-25 DIAGNOSIS — M5136 Other intervertebral disc degeneration, lumbar region: Secondary | ICD-10-CM | POA: Diagnosis not present

## 2022-09-25 DIAGNOSIS — M9901 Segmental and somatic dysfunction of cervical region: Secondary | ICD-10-CM | POA: Diagnosis not present

## 2022-09-25 DIAGNOSIS — M9903 Segmental and somatic dysfunction of lumbar region: Secondary | ICD-10-CM | POA: Diagnosis not present

## 2022-09-25 DIAGNOSIS — S338XXA Sprain of other parts of lumbar spine and pelvis, initial encounter: Secondary | ICD-10-CM | POA: Diagnosis not present

## 2022-09-25 DIAGNOSIS — M9905 Segmental and somatic dysfunction of pelvic region: Secondary | ICD-10-CM | POA: Diagnosis not present

## 2022-09-25 DIAGNOSIS — M9902 Segmental and somatic dysfunction of thoracic region: Secondary | ICD-10-CM | POA: Diagnosis not present

## 2022-09-25 DIAGNOSIS — M47814 Spondylosis without myelopathy or radiculopathy, thoracic region: Secondary | ICD-10-CM | POA: Diagnosis not present

## 2022-09-30 ENCOUNTER — Ambulatory Visit: Payer: Medicare PPO

## 2022-10-01 ENCOUNTER — Encounter: Payer: Self-pay | Admitting: Pharmacist

## 2022-10-01 ENCOUNTER — Other Ambulatory Visit: Payer: Medicare PPO | Admitting: Pharmacist

## 2022-10-01 NOTE — Progress Notes (Signed)
10/01/2022 Name: Jasmine Lopez MRN: [id number] DOB: Apr 29, 1941  Chief Complaint  Patient presents with   Medication Management   Diabetes    Jasmine Lopez is a 82 y.o. year old female who presented for a telephone visit.   They were referred to the pharmacist by their PCP for assistance in managing diabetes.   Subjective:  Care Team: Primary Care Provider: [name], FNP ; Next Scheduled Visit: 10/2022  Medication Access/Adherence  Current Pharmacy:  [id number] G[place], [place] - [address] AT [facility] [address] [place] [address] Phone: [phone] Fax: [phone]   Patient reports affordability concerns with their medications: No  Patient reports access/transportation concerns to their pharmacy: No  Patient reports adherence concerns with their medications:  No    [facility] Medication Adherence Questionnaire   10/01/2022  7:03 AM EDT - Filed by Patient  During the past 2 weeks, have you missed any doses of your medication(s)? Yes  During the past 2 weeks, have you missed any doses of your medications because you could not afford them? No  During the past 2 weeks, have you missed any doses of your medications because you could not get to the pharmacy? No  During the past 2 weeks, have you missed any doses of your medications because you could not get refills? No  During the past 2 weeks, have you missed any doses of your medication because you do not know what the medication is for? No  During the past 2 weeks, have you missed any doses of your medications because your medications make you feel sick? No  During the past 2 weeks, have you missed any doses of your medications because you have too many medications? No  During the past 2 weeks, have you missed any doses of your medications because you forgot to take your medications? Yes  During the past 2 weeks, have you missed any doses of your medications because you feel fine  and do not think you need the medication(s)? Yes  Have you missed doses of your medications in the past 2 weeks for any other reason not listed above? Yes     Diabetes:  Current medications: metformin XR 500 mg daily, has not started Ozempic due to cost  Current glucose readings: has not checked any readings  Patient denies hypoglycemic s/sx including dizziness, shakiness, sweating. Patient denies hyperglycemic symptoms including polyuria, polydipsia, polyphagia, nocturia, neuropathy, blurred vision.  Current meal patterns:  - Reports in the past 3 months, she has had more carbohydrates "got lax with my diet".   Current medication access support: over income for patient assistance  Hypertension:  Current medications: amlodipine 5 mg daily, carvedilol 6.25 mg twice daily, valsartan 160/12.5 mg daily  Patient has a validated, automated, upper arm home BP cuff Current blood pressure readings readings: 130/60    Hyperlipidemia/ASCVD Risk Reduction  Current lipid lowering medications: rosuvastatin 20 mg daily  Objective:  Lab Results  Component Value Date   HGBA1C 8.0 (H) 09/11/2022    Lab Results  Component Value Date   CREATININE 0.88 09/11/2022   BUN 15 09/11/2022   NA 138 09/11/2022   K 4.4 09/11/2022   CL 101 09/11/2022   CO2 22 09/11/2022    Lab Results  Component Value Date   CHOL 134 05/13/2022   HDL 47 05/13/2022   LDLCALC 72 05/13/2022   TRIG 76 05/13/2022   CHOLHDL 2.9 05/13/2022    Medications Reviewed Today  Reviewed by Alden Hipp, RPH-CPP (Pharmacist) on 10/01/22 at [phone]  Med List Status: <None>   Medication Order Taking? Sig Documenting Provider Last Dose Status Informant  0.9 %  sodium chloride infusion [id number]   Armbruster, Willaim Rayas, MD  Active   Accu-Chek Softclix Lancets lancets [id number]  Use to check blood sugars twice daily E11.69  Patient not taking: Reported on 05/13/2022   [name], FNP  Active   acetaminophen  (TYLENOL) 500 MG tablet [id number]  Take 500 mg by mouth every 6 (six) hours as needed.  Patient not taking: Reported on 08/27/2022   [provider]  Active   albuterol (VENTOLIN HFA) 108 (90 Base) MCG/ACT inhaler [id number]  1-2 puffs every 6 (six) hours as needed for wheezing or shortness of breath.  Patient not taking: Reported on 08/27/2022   [provider]  Active   allopurinol (ZYLOPRIM) 100 MG tablet [id number]  Take 1 tablet (100 mg total) by mouth daily. [name], FNP  Active   amLODipine (NORVASC) 5 MG tablet [id number] Yes Take 5 mg by mouth daily. [provider] Taking Active   Biotin 1000 MCG tablet [id number]  Take 1,000 mcg by mouth daily. [provider]  Active   blood glucose meter kit and supplies KIT [id number]  Dispense based on patient and insurance preference. Use up to two times daily as directed. (FOR ICD-9 250.00, 250.01).  Patient not taking: Reported on 05/13/2022   Rodriguez-Southworth, Nettie Elm, PA-C  Active   carvedilol (COREG) 6.25 MG tablet [id number] Yes Please take 1.5 tablets twice daily Alver Sorrow, NP Taking Active   Cholecalciferol (VITAMIN D3) 125 MCG (5000 UT) CAPS [id number]  Take 1 capsule by mouth daily. [provider]  Active   Garlic 1000 MG CAPS [id number]  Take 1 capsule by mouth daily. [provider]  Active   glucose blood (ACCU-CHEK SMARTVIEW) test strip [id number]  Use to check blood sugars twice daily E11.69  Patient not taking: Reported on 05/13/2022   [name], FNP  Active   metFORMIN (GLUCOPHAGE-XR) 500 MG 24 hr tablet [id number] Yes Take 1 tablet (500 mg total) by mouth daily. [name], FNP Taking Active   Multiple Vitamin (MULTIVITAMIN) capsule [id number]  Take 1 capsule by mouth daily. [provider]  Active   rosuvastatin (CRESTOR) 20 MG tablet [id number] Yes TAKE 1 TABLET(20 MG) BY MOUTH DAILY [name], FNP Taking Active   Semaglutide,0.25 or 0.5MG /DOS, (OZEMPIC,  0.25 OR 0.5 MG/DOSE,) 2 MG/1.5ML SOPN [id number]  Inject 0.5 mg into the skin once a week.  Patient not taking: Reported on 08/27/2022   [name], FNP  Active   valsartan-hydrochlorothiazide (DIOVAN HCT) 160-12.5 MG tablet [id number] Yes Take 1 tablet by mouth daily. [name], FNP Taking Active               Assessment/Plan:   Diabetes: - Currently uncontrolled - Reviewed long term cardiovascular and renal outcomes of uncontrolled blood sugar - Reviewed goal A1c, goal fasting, and goal 2 hour post prandial glucose - Recommend to start Ozempic 0.25 mg weekly for 4 weeks, then increase to 0.5 mg weekly as previously ordered. Patient agrees to go ahead and start medication. Continue metformin XR 500 mg daily - Recommend to check glucose twice daily, document, and provide at future appointments  Hypertension: - Currently uncontrolled - Reviewed long term cardiovascular and renal outcomes of uncontrolled blood pressure - Reviewed appropriate blood pressure monitoring technique and reviewed goal blood pressure. Recommended to check  home blood pressure and heart rate weekly - Recommend to cont   Hyperlipidemia/ASCVD Risk Reduction: - Currently uncontrolled.  - Recommend to continue current regimen at this time   Follow Up Plan: phone call in 4 weeks  Catie TClearance Coots, PharmD, BCACP, CPP [facility] [phone]

## 2022-10-02 DIAGNOSIS — M5032 Other cervical disc degeneration, mid-cervical region, unspecified level: Secondary | ICD-10-CM | POA: Diagnosis not present

## 2022-10-02 DIAGNOSIS — M5136 Other intervertebral disc degeneration, lumbar region: Secondary | ICD-10-CM | POA: Diagnosis not present

## 2022-10-02 DIAGNOSIS — M9902 Segmental and somatic dysfunction of thoracic region: Secondary | ICD-10-CM | POA: Diagnosis not present

## 2022-10-02 DIAGNOSIS — M47814 Spondylosis without myelopathy or radiculopathy, thoracic region: Secondary | ICD-10-CM | POA: Diagnosis not present

## 2022-10-02 DIAGNOSIS — M9901 Segmental and somatic dysfunction of cervical region: Secondary | ICD-10-CM | POA: Diagnosis not present

## 2022-10-02 DIAGNOSIS — S338XXA Sprain of other parts of lumbar spine and pelvis, initial encounter: Secondary | ICD-10-CM | POA: Diagnosis not present

## 2022-10-02 DIAGNOSIS — M9905 Segmental and somatic dysfunction of pelvic region: Secondary | ICD-10-CM | POA: Diagnosis not present

## 2022-10-02 DIAGNOSIS — M9903 Segmental and somatic dysfunction of lumbar region: Secondary | ICD-10-CM | POA: Diagnosis not present

## 2022-10-09 DIAGNOSIS — S338XXA Sprain of other parts of lumbar spine and pelvis, initial encounter: Secondary | ICD-10-CM | POA: Diagnosis not present

## 2022-10-09 DIAGNOSIS — M47814 Spondylosis without myelopathy or radiculopathy, thoracic region: Secondary | ICD-10-CM | POA: Diagnosis not present

## 2022-10-09 DIAGNOSIS — M9903 Segmental and somatic dysfunction of lumbar region: Secondary | ICD-10-CM | POA: Diagnosis not present

## 2022-10-09 DIAGNOSIS — M9905 Segmental and somatic dysfunction of pelvic region: Secondary | ICD-10-CM | POA: Diagnosis not present

## 2022-10-09 DIAGNOSIS — M5136 Other intervertebral disc degeneration, lumbar region: Secondary | ICD-10-CM | POA: Diagnosis not present

## 2022-10-09 DIAGNOSIS — M9902 Segmental and somatic dysfunction of thoracic region: Secondary | ICD-10-CM | POA: Diagnosis not present

## 2022-10-09 DIAGNOSIS — M9901 Segmental and somatic dysfunction of cervical region: Secondary | ICD-10-CM | POA: Diagnosis not present

## 2022-10-09 DIAGNOSIS — M5032 Other cervical disc degeneration, mid-cervical region, unspecified level: Secondary | ICD-10-CM | POA: Diagnosis not present

## 2022-10-13 ENCOUNTER — Ambulatory Visit (INDEPENDENT_AMBULATORY_CARE_PROVIDER_SITE_OTHER): Payer: Medicare PPO | Admitting: Podiatry

## 2022-10-13 ENCOUNTER — Ambulatory Visit (INDEPENDENT_AMBULATORY_CARE_PROVIDER_SITE_OTHER): Payer: Medicare PPO

## 2022-10-13 ENCOUNTER — Encounter: Payer: Self-pay | Admitting: Podiatry

## 2022-10-13 DIAGNOSIS — M722 Plantar fascial fibromatosis: Secondary | ICD-10-CM

## 2022-10-13 MED ORDER — MELOXICAM 15 MG PO TABS
15.0000 mg | ORAL_TABLET | Freq: Every day | ORAL | 0 refills | Status: DC
Start: 2022-10-13 — End: 2022-11-24

## 2022-10-13 NOTE — Progress Notes (Signed)
  Subjective:  Patient ID: Jasmine Lopez, female    DOB: 09/17/1940,   MRN: [id number]  Chief Complaint  Patient presents with   Foot Pain    Plantar heel and arch right - aching x 4 days, keeps getting up at night to potty and feels a lot of pain, "feels like my arch has fallen", tried Tylenol daily, seems shoes with heels help the pain   Diabetes    Last A1c was 8.0    82 y.o. female presents for new concern for right heel and arch pain that has been going on for about 4 days. Relates most painful at night when getting up and feels like her arch has fallen. Has tried tylenol and shoes with heels seem to help.  Patient is diabetic and last A1c was  Lab Results  Component Value Date   HGBA1C 8.0 (H) 09/11/2022   .   PCP:  [name], FNP    . Denies any other pedal complaints. Denies n/v/f/c.   Past Medical History:  Diagnosis Date   Anxiety    Breast cancer (HCC)    Cancer (HCC)    Diabetes mellitus without complication (HCC)    Dysphagia    GERD (gastroesophageal reflux disease) 10/12/2020   Hair loss 10/12/2020   Heart murmur    Hx of adenomatous polyp of colon 06/2015   Dr. [facility]yman [facility]   Hypertension    Irregular heart beat    Sleep apnea    SVT (supraventricular tachycardia) 08/11/2022    Objective:  Physical Exam: Vascular: DP/PT pulses 2/4 bilateral. CFT <3 seconds. Normal hair growth on digits. No edema.  Skin. No lacerations or abrasions bilateral feet.  Musculoskeletal: MMT 5/5 bilateral lower extremities in DF, PF, Inversion and Eversion. Deceased ROM in DF of ankle joint. Tender to medial calcaneal tubercle on the right and pain through the arch. No pain along achilles or PT. No pain with calcaneal squeeze.  Neurological: Sensation intact to light touch.   Assessment:   1. Plantar fasciitis of right foot      Plan:  Patient was evaluated and treated and all questions answered. Discussed plantar fasciitis with patient.  X-rays  reviewed and discussed with patient. No acute fractures or dislocations noted. Mild spurring noted at inferior calcaneus. Pes planus noted.  Discussed treatment options including, ice, NSAIDS, supportive shoes, bracing, and stretching. Stretching exercises provided to be done on a daily basis.   Prescription for meloxicam provided and sent to pharmacy. CMP reviewed and kidney function normal.  Pf brace dispensed.  Follow-up 6 weeks or sooner if any problems arise. In the meantime, encouraged to call the office with any questions, concerns, change in symptoms.       Louann Sjogren, DPM

## 2022-10-13 NOTE — Patient Instructions (Signed)

## 2022-10-16 ENCOUNTER — Encounter: Payer: Self-pay | Admitting: Family Medicine

## 2022-10-16 ENCOUNTER — Ambulatory Visit (INDEPENDENT_AMBULATORY_CARE_PROVIDER_SITE_OTHER): Payer: Medicare PPO | Admitting: Family Medicine

## 2022-10-16 VITALS — BP 130/64 | HR 85 | Temp 98.5°F | Ht 65.0 in | Wt 188.6 lb

## 2022-10-16 DIAGNOSIS — F984 Stereotyped movement disorders: Secondary | ICD-10-CM

## 2022-10-16 DIAGNOSIS — Z6831 Body mass index (BMI) 31.0-31.9, adult: Secondary | ICD-10-CM

## 2022-10-16 DIAGNOSIS — W19XXXA Unspecified fall, initial encounter: Secondary | ICD-10-CM

## 2022-10-16 DIAGNOSIS — E669 Obesity, unspecified: Secondary | ICD-10-CM | POA: Diagnosis not present

## 2022-10-16 NOTE — Progress Notes (Signed)
I,Jasmine Lopez Lopez,acting as a scribe for [employer], NP.,have documented all relevant documentation on the behalf of Jasmine Lopez [place], NP,as directed by  Jasmine Petrides Moshe Salisbury, NP while in the presence of Jasmine Lopez Lopez [facility], NP.  Subjective:  Patient ID: Jasmine Lopez Lopez , female    DOB: 1940-08-26 , 82 y.o.   MRN: [id number]  Chief Complaint  Patient presents with   Fall    HPI  Patient presents today for a fall that happen yesterday, patient reports her right ankle went out while grocery shopping. She reports hitting her head on the store cart when she tripped, patient reports when she hit her head she felt dizzy for a bit and "saw stars" Patient reports she didn't notice the knot on her head until she was home and got undressed she reports after the trip she continued with her day with no other issues. Patient reports the knot has reduced in size because she applied ice yesterday but the spot on her head is sore to the touch but no other issues regarding hitting her head. Patient has been to yoga today, and also went to see her chiropractor, and drove to all her needs today.Patient denies headache, vomiting, dizziness, lightheadedness etc Patient was advised to go home and rest, use the ice as needed for her head and call 911 if she starts to vomit, have blurry vision, lightheadedness or feel unease. Patient agreed that she understood the instructions and will comply.  BP Readings from Last 3 Encounters: 10/16/22 : 130/64 10/01/22 : 130/60 09/11/22 : (!) 140/60    Fall The accident occurred 12 to 24 hours ago. The fall occurred while walking. Impact surface: shopping cart. The point of impact was the head. The pain is present in the head. The pain is mild (tender to touch). The symptoms are aggravated by pressure on injury. Pertinent negatives include no headaches, loss of consciousness, nausea, visual change or vomiting. She has tried ice for the symptoms. The treatment provided significant relief.     Past  Medical History:  Diagnosis Date   Anxiety    Breast cancer (HCC)    Cancer (HCC)    Diabetes mellitus without complication (HCC)    Dysphagia    GERD (gastroesophageal reflux disease) 10/12/2020   Hair loss 10/12/2020   Heart murmur    Hx of adenomatous polyp of colon 06/2015   Dr. Hyman [facility]- [facility]   Hypertension    Irregular heart beat    Sleep apnea    SVT (supraventricular tachycardia) 08/11/2022     Family History  Problem Relation Age of Onset   Heart failure Mother        CAD   COPD Mother    Hypertension Mother    Cancer Father    Rectal cancer Maternal Grandmother        Unsure   Colon cancer Maternal Grandmother    Heart attack Maternal Grandfather    Esophageal cancer Neg Hx    Stomach cancer Neg Hx    Breast cancer Neg Hx       Allergies  Allergen Reactions   Ace Inhibitors Cough   Atorvastatin Other (See Comments)    Leg pain    Benadryl [Diphenhydramine] Itching    Benadryl cream   Peppermint Flavor [Flavoring Agent]     Causes horsenss     Review of Systems  Constitutional: Negative.   Respiratory: Negative.    Cardiovascular: Negative.   Gastrointestinal: Negative.  Negative for nausea and vomiting.  Neurological:  Negative for dizziness, loss of consciousness, light-headedness and headaches.  Psychiatric/Behavioral: Negative.       Today's Vitals   10/16/22 1436  BP: 130/64  Pulse: 85  Temp: 98.5 F (36.9 C)  TempSrc: Oral  Weight: 188 lb 9.6 oz (85.5 kg)  Height: 5\' 5"  (1.651 m)  PainSc: 5   PainLoc: Head   Body mass index is 31.38 kg/m.  Wt Readings from Last 3 Encounters:  10/16/22 188 lb 9.6 oz (85.5 kg)  09/11/22 184 lb 3.2 oz (83.6 kg)  08/11/22 191 lb (86.6 kg)    The ASCVD Risk score (Arnett DK, et al., 2019) failed to calculate for the following reasons:   The 2019 ASCVD risk score is only valid for ages 92 to 36  Objective:  Physical Exam HENT:     Head:     Comments: Tender spot on her forehead  slightly red, no hematoma noted Musculoskeletal:        General: Tenderness present.     Comments: Right ankle  Skin:    General: Skin is warm and dry.  Neurological:     Mental Status: She is alert and oriented to person, place, and time. Mental status is at baseline.         Assessment And Plan:  1. Fall, initial encounter - Ambulatory referral to Physical Therapy  2. Obesity (BMI 30.0-34.9)    Return if symptoms worsen or fail to improve, for keep your previously scheduled appt.  Patient was given opportunity to ask questions. Patient verbalized understanding of the plan and was able to repeat key elements of the plan. All questions were answered to their satisfaction.  Jasmine Lopez Gang Moshe Salisbury, NP  I, Jasmine Baskett Moshe Salisbury, NP, have reviewed all documentation for this visit. The documentation on 10/20/22 for the exam, diagnosis, procedures, and orders are all accurate and complete.   IF YOU HAVE BEEN REFERRED TO A SPECIALIST, IT MAY TAKE 1-2 WEEKS TO SCHEDULE/PROCESS THE REFERRAL. IF YOU HAVE NOT HEARD FROM US/SPECIALIST IN TWO WEEKS, PLEASE GIVE [place] A CALL AT [phone].

## 2022-10-20 DIAGNOSIS — W19XXXA Unspecified fall, initial encounter: Secondary | ICD-10-CM

## 2022-10-20 HISTORY — DX: Unspecified fall, initial encounter: W19.XXXA

## 2022-10-20 NOTE — Assessment & Plan Note (Signed)
Walk with care; wear comfortable shoes for walking. Physical Therapy advised

## 2022-10-21 ENCOUNTER — Ambulatory Visit: Payer: Medicare PPO | Admitting: Nurse Practitioner

## 2022-10-23 ENCOUNTER — Ambulatory Visit (INDEPENDENT_AMBULATORY_CARE_PROVIDER_SITE_OTHER): Payer: Medicare PPO | Admitting: Podiatry

## 2022-10-23 DIAGNOSIS — M722 Plantar fascial fibromatosis: Secondary | ICD-10-CM

## 2022-10-23 DIAGNOSIS — M47817 Spondylosis without myelopathy or radiculopathy, lumbosacral region: Secondary | ICD-10-CM | POA: Diagnosis not present

## 2022-10-23 DIAGNOSIS — M5032 Other cervical disc degeneration, mid-cervical region, unspecified level: Secondary | ICD-10-CM | POA: Diagnosis not present

## 2022-10-23 DIAGNOSIS — E1169 Type 2 diabetes mellitus with other specified complication: Secondary | ICD-10-CM

## 2022-10-23 DIAGNOSIS — M9903 Segmental and somatic dysfunction of lumbar region: Secondary | ICD-10-CM | POA: Diagnosis not present

## 2022-10-23 DIAGNOSIS — M9905 Segmental and somatic dysfunction of pelvic region: Secondary | ICD-10-CM | POA: Diagnosis not present

## 2022-10-23 DIAGNOSIS — M9901 Segmental and somatic dysfunction of cervical region: Secondary | ICD-10-CM | POA: Diagnosis not present

## 2022-10-23 DIAGNOSIS — M2141 Flat foot [pes planus] (acquired), right foot: Secondary | ICD-10-CM

## 2022-10-23 DIAGNOSIS — E785 Hyperlipidemia, unspecified: Secondary | ICD-10-CM

## 2022-10-23 DIAGNOSIS — M5136 Other intervertebral disc degeneration, lumbar region: Secondary | ICD-10-CM | POA: Diagnosis not present

## 2022-10-23 DIAGNOSIS — M9902 Segmental and somatic dysfunction of thoracic region: Secondary | ICD-10-CM | POA: Diagnosis not present

## 2022-10-23 DIAGNOSIS — M47814 Spondylosis without myelopathy or radiculopathy, thoracic region: Secondary | ICD-10-CM | POA: Diagnosis not present

## 2022-10-23 DIAGNOSIS — M2142 Flat foot [pes planus] (acquired), left foot: Secondary | ICD-10-CM

## 2022-10-23 NOTE — Progress Notes (Signed)
Recasted patient for her diabetic shoes picked out new pair because the ones she picked out were discontinues  x527w 8.5 xw

## 2022-10-27 ENCOUNTER — Ambulatory Visit: Payer: Medicare PPO | Admitting: Nurse Practitioner

## 2022-10-30 DIAGNOSIS — L668 Other cicatricial alopecia: Secondary | ICD-10-CM | POA: Diagnosis not present

## 2022-11-05 DIAGNOSIS — M5136 Other intervertebral disc degeneration, lumbar region: Secondary | ICD-10-CM | POA: Diagnosis not present

## 2022-11-05 DIAGNOSIS — R2689 Other abnormalities of gait and mobility: Secondary | ICD-10-CM | POA: Diagnosis not present

## 2022-11-05 DIAGNOSIS — M5032 Other cervical disc degeneration, mid-cervical region, unspecified level: Secondary | ICD-10-CM | POA: Diagnosis not present

## 2022-11-05 DIAGNOSIS — M9901 Segmental and somatic dysfunction of cervical region: Secondary | ICD-10-CM | POA: Diagnosis not present

## 2022-11-05 DIAGNOSIS — M47814 Spondylosis without myelopathy or radiculopathy, thoracic region: Secondary | ICD-10-CM | POA: Diagnosis not present

## 2022-11-05 DIAGNOSIS — M9903 Segmental and somatic dysfunction of lumbar region: Secondary | ICD-10-CM | POA: Diagnosis not present

## 2022-11-05 DIAGNOSIS — M722 Plantar fascial fibromatosis: Secondary | ICD-10-CM | POA: Diagnosis not present

## 2022-11-05 DIAGNOSIS — M9902 Segmental and somatic dysfunction of thoracic region: Secondary | ICD-10-CM | POA: Diagnosis not present

## 2022-11-05 DIAGNOSIS — M9905 Segmental and somatic dysfunction of pelvic region: Secondary | ICD-10-CM | POA: Diagnosis not present

## 2022-11-06 ENCOUNTER — Telehealth: Payer: Self-pay | Admitting: Cardiovascular Disease

## 2022-11-06 MED ORDER — CARVEDILOL 6.25 MG PO TABS: ORAL_TABLET | ORAL | 3 refills | Status: DC

## 2022-11-06 NOTE — Telephone Encounter (Signed)
Rx(s) sent to pharmacy electronically.  

## 2022-11-06 NOTE — Telephone Encounter (Signed)
*  STAT* If patient is at the pharmacy, call can be transferred to refill team.   1. Which medications need to be refilled? (please list name of each medication and dose if known)    carvedilol (COREG) 6.25 MG tablet    2. Which pharmacy/location (including street and city if local pharmacy) is medication to be sent to?   [id number] - [place], [place] - [address] AT [address]    3. Do they need a 30 day or 90 day supply? 90

## 2022-11-24 ENCOUNTER — Encounter: Payer: Self-pay | Admitting: Podiatry

## 2022-11-24 ENCOUNTER — Ambulatory Visit (INDEPENDENT_AMBULATORY_CARE_PROVIDER_SITE_OTHER): Payer: Medicare PPO | Admitting: Podiatry

## 2022-11-24 DIAGNOSIS — M722 Plantar fascial fibromatosis: Secondary | ICD-10-CM

## 2022-11-24 DIAGNOSIS — M9901 Segmental and somatic dysfunction of cervical region: Secondary | ICD-10-CM | POA: Diagnosis not present

## 2022-11-24 DIAGNOSIS — M9905 Segmental and somatic dysfunction of pelvic region: Secondary | ICD-10-CM | POA: Diagnosis not present

## 2022-11-24 DIAGNOSIS — M5136 Other intervertebral disc degeneration, lumbar region: Secondary | ICD-10-CM | POA: Diagnosis not present

## 2022-11-24 DIAGNOSIS — M47814 Spondylosis without myelopathy or radiculopathy, thoracic region: Secondary | ICD-10-CM | POA: Diagnosis not present

## 2022-11-24 DIAGNOSIS — M9903 Segmental and somatic dysfunction of lumbar region: Secondary | ICD-10-CM | POA: Diagnosis not present

## 2022-11-24 DIAGNOSIS — M47817 Spondylosis without myelopathy or radiculopathy, lumbosacral region: Secondary | ICD-10-CM | POA: Diagnosis not present

## 2022-11-24 DIAGNOSIS — M5032 Other cervical disc degeneration, mid-cervical region, unspecified level: Secondary | ICD-10-CM | POA: Diagnosis not present

## 2022-11-24 DIAGNOSIS — M9902 Segmental and somatic dysfunction of thoracic region: Secondary | ICD-10-CM | POA: Diagnosis not present

## 2022-11-24 MED ORDER — MELOXICAM 15 MG PO TABS: 15.0000 mg | ORAL_TABLET | Freq: Every day | ORAL | 0 refills | Status: DC

## 2022-11-24 NOTE — Progress Notes (Signed)
  Subjective:  Patient ID: Jasmine Lopez, female    DOB: 1941/02/27,   MRN: [id number]  Chief Complaint  Patient presents with   Plantar Fasciitis    Pt stated that she is doing okay she has not started physical therapy yet. She stated that she feels like she can not walk without the brace     82 y.o. female presents for follow-up of right plantar fasciitis. Relates she is doing better. She relates the brace is helpful but cannot walk without it. Relates she has not done the stretching exercises . She was sent to PT by primary care for a fall.  Patient is diabetic and last A1c was  Lab Results  Component Value Date   HGBA1C 8.0 (H) 09/11/2022   .   PCP:  [name], FNP    . Denies any other pedal complaints. Denies n/v/f/c.   Past Medical History:  Diagnosis Date   Anxiety    Breast cancer (HCC)    Cancer (HCC)    Diabetes mellitus without complication (HCC)    Dysphagia    GERD (gastroesophageal reflux disease) 10/12/2020   Hair loss 10/12/2020   Heart murmur    Hx of adenomatous polyp of colon 06/2015   Dr. Hyman [facility]   Hypertension    Irregular heart beat    Sleep apnea    SVT (supraventricular tachycardia) 08/11/2022    Objective:  Physical Exam: Vascular: DP/PT pulses 2/4 bilateral. CFT <3 seconds. Normal hair growth on digits. No edema.  Skin. No lacerations or abrasions bilateral feet.  Musculoskeletal: MMT 5/5 bilateral lower extremities in DF, PF, Inversion and Eversion. Deceased ROM in DF of ankle joint. Tender to medial calcaneal tubercle on the right and pain through the arch. No pain along achilles or PT. No pain with calcaneal squeeze.  Neurological: Sensation intact to light touch.   Assessment:   1. Plantar fasciitis of right foot       Plan:  Patient was evaluated and treated and all questions answered. Discussed plantar fasciitis with patient.  X-rays reviewed and discussed with patient. No acute fractures or dislocations  noted. Mild spurring noted at inferior calcaneus. Pes planus noted.  Discussed treatment options including, ice, NSAIDS, supportive shoes, bracing, and stretching.  Continue stretching and brace.  Refill for melxoicam provided.  Follow-up for rfc.       Louann Sjogren, DPM

## 2022-11-27 DIAGNOSIS — M722 Plantar fascial fibromatosis: Secondary | ICD-10-CM | POA: Diagnosis not present

## 2022-12-02 DIAGNOSIS — M722 Plantar fascial fibromatosis: Secondary | ICD-10-CM | POA: Diagnosis not present

## 2022-12-03 ENCOUNTER — Ambulatory Visit (INDEPENDENT_AMBULATORY_CARE_PROVIDER_SITE_OTHER): Payer: Medicare PPO | Admitting: Podiatry

## 2022-12-03 DIAGNOSIS — M79674 Pain in right toe(s): Secondary | ICD-10-CM

## 2022-12-03 DIAGNOSIS — E785 Hyperlipidemia, unspecified: Secondary | ICD-10-CM

## 2022-12-03 DIAGNOSIS — I872 Venous insufficiency (chronic) (peripheral): Secondary | ICD-10-CM

## 2022-12-03 DIAGNOSIS — M79675 Pain in left toe(s): Secondary | ICD-10-CM | POA: Diagnosis not present

## 2022-12-03 DIAGNOSIS — E1169 Type 2 diabetes mellitus with other specified complication: Secondary | ICD-10-CM | POA: Diagnosis not present

## 2022-12-03 DIAGNOSIS — R600 Localized edema: Secondary | ICD-10-CM | POA: Diagnosis not present

## 2022-12-03 DIAGNOSIS — B351 Tinea unguium: Secondary | ICD-10-CM

## 2022-12-03 NOTE — Progress Notes (Signed)
  Subjective:  Patient ID: Jasmine Lopez, female    DOB: 10-31-1940,  MRN: [id number]  Jasmine Lopez presents to clinic today for: preventative diabetic foot care and painful elongated mycotic toenails 1-5 bilaterally which are tender when wearing enclosed shoe gear. Pain is relieved with periodic professional debridement. Patient has lower extremity edema and is not sure about obtaining compression hose to manage her edema. Chief Complaint  Patient presents with   Diabetes    [facility] BS - DIDN'T CHECK IT A1C - 8  LVPCP - 10/2022    PCP is [name], FNP.  Allergies  Allergen Reactions   Ace Inhibitors Cough   Atorvastatin Other (See Comments)    Leg pain    Benadryl [Diphenhydramine] Itching    Benadryl cream   Peppermint Flavor [Flavoring Agent]     Causes horsenss    Review of Systems: Negative except as noted in the HPI.  Objective: No changes noted in today's physical examination. There were no vitals filed for this visit.  Jasmine Lopez is a pleasant 82 y.o. female in NAD. AAO x 3.  Vascular Examination: Capillary refill time <3 seconds b/l LE. Palpable pedal pulses b/l LE. Digital hair present b/l.  Skin temperature gradient WNL b/l.  +1 pitting edema noted BLE.Marland Kitchen  Dermatological Examination: Pedal skin with normal turgor, texture and tone b/l. No open wounds. No interdigital macerations b/l. Toenails 1-5 b/l thickened, discolored, dystrophic with subungual debris. There is pain on palpation to dorsal aspect of nailplates. No hyperkeratotic nor porokeratotic lesions present on today's visit.Marland Kitchen  Neurological Examination: Protective sensation intact with 10 gram monofilament b/l LE.   Musculoskeletal Examination: Muscle strength 5/5 to all lower extremity muscle groups bilaterally.     Latest Ref Rng & Units 09/11/2022   11:22 AM 05/13/2022   10:42 AM  Hemoglobin A1C  Hemoglobin-A1c 4.8 - 5.6 % 8.0  7.9    Assessment/Plan: 1. Pain due to onychomycosis of  toenails of both feet   2. Bilateral edema of lower extremity   3. Chronic venous insufficiency   4. Type 2 diabetes mellitus with hyperlipidemia (HCC)     -Patient was evaluated and treated. All patient's and/or POA's questions/concerns answered on today's visit. -Continue foot and shoe inspections daily. Monitor blood glucose per PCP/Endocrinologist's recommendations. -Continue supportive shoe gear daily. -Toenails 1-5 b/l were debrided in length and girth with sterile nail nippers and dremel without iatrogenic bleeding.  -Patient to obtain knee high compression stockings 20-30 mmHg; both open toed and close toed. Patient given information for [employer], [address] [place], [place], [place] [address]. -Patient/POA to call should there be question/concern in the interim.   Return in about 3 months (around 03/05/2023).  Freddie Breech, DPM

## 2022-12-03 NOTE — Patient Instructions (Signed)
Wear Compression knee highs 20-30 mmHg You may purchase closed toe and open toe styles.  Compression Stockings:  [employer] [address] [place], [place] [address]  [phone]

## 2022-12-04 DIAGNOSIS — M722 Plantar fascial fibromatosis: Secondary | ICD-10-CM | POA: Diagnosis not present

## 2022-12-09 DIAGNOSIS — M722 Plantar fascial fibromatosis: Secondary | ICD-10-CM | POA: Diagnosis not present

## 2022-12-10 ENCOUNTER — Encounter: Payer: Self-pay | Admitting: Podiatry

## 2022-12-11 DIAGNOSIS — M5032 Other cervical disc degeneration, mid-cervical region, unspecified level: Secondary | ICD-10-CM | POA: Diagnosis not present

## 2022-12-11 DIAGNOSIS — M9903 Segmental and somatic dysfunction of lumbar region: Secondary | ICD-10-CM | POA: Diagnosis not present

## 2022-12-11 DIAGNOSIS — M47814 Spondylosis without myelopathy or radiculopathy, thoracic region: Secondary | ICD-10-CM | POA: Diagnosis not present

## 2022-12-11 DIAGNOSIS — M9902 Segmental and somatic dysfunction of thoracic region: Secondary | ICD-10-CM | POA: Diagnosis not present

## 2022-12-11 DIAGNOSIS — M47817 Spondylosis without myelopathy or radiculopathy, lumbosacral region: Secondary | ICD-10-CM | POA: Diagnosis not present

## 2022-12-11 DIAGNOSIS — M9905 Segmental and somatic dysfunction of pelvic region: Secondary | ICD-10-CM | POA: Diagnosis not present

## 2022-12-11 DIAGNOSIS — M9901 Segmental and somatic dysfunction of cervical region: Secondary | ICD-10-CM | POA: Diagnosis not present

## 2022-12-11 DIAGNOSIS — M722 Plantar fascial fibromatosis: Secondary | ICD-10-CM | POA: Diagnosis not present

## 2022-12-11 DIAGNOSIS — M5136 Other intervertebral disc degeneration, lumbar region: Secondary | ICD-10-CM | POA: Diagnosis not present

## 2022-12-12 ENCOUNTER — Ambulatory Visit (INDEPENDENT_AMBULATORY_CARE_PROVIDER_SITE_OTHER): Payer: Medicare PPO

## 2022-12-12 DIAGNOSIS — E1169 Type 2 diabetes mellitus with other specified complication: Secondary | ICD-10-CM

## 2022-12-12 DIAGNOSIS — I872 Venous insufficiency (chronic) (peripheral): Secondary | ICD-10-CM

## 2022-12-12 DIAGNOSIS — M722 Plantar fascial fibromatosis: Secondary | ICD-10-CM

## 2022-12-12 DIAGNOSIS — M2141 Flat foot [pes planus] (acquired), right foot: Secondary | ICD-10-CM

## 2022-12-12 NOTE — Progress Notes (Signed)

## 2022-12-17 DIAGNOSIS — M722 Plantar fascial fibromatosis: Secondary | ICD-10-CM | POA: Diagnosis not present

## 2022-12-17 DIAGNOSIS — R2689 Other abnormalities of gait and mobility: Secondary | ICD-10-CM | POA: Diagnosis not present

## 2022-12-25 ENCOUNTER — Telehealth: Payer: Self-pay | Admitting: Cardiovascular Disease

## 2022-12-25 ENCOUNTER — Telehealth (HOSPITAL_BASED_OUTPATIENT_CLINIC_OR_DEPARTMENT_OTHER): Payer: Self-pay

## 2022-12-25 ENCOUNTER — Ambulatory Visit (INDEPENDENT_AMBULATORY_CARE_PROVIDER_SITE_OTHER): Payer: Medicare PPO

## 2022-12-25 ENCOUNTER — Ambulatory Visit: Payer: Medicare PPO

## 2022-12-25 DIAGNOSIS — Z0000 Encounter for general adult medical examination without abnormal findings: Secondary | ICD-10-CM

## 2022-12-25 DIAGNOSIS — I10 Essential (primary) hypertension: Secondary | ICD-10-CM

## 2022-12-25 DIAGNOSIS — M47817 Spondylosis without myelopathy or radiculopathy, lumbosacral region: Secondary | ICD-10-CM | POA: Diagnosis not present

## 2022-12-25 DIAGNOSIS — M9901 Segmental and somatic dysfunction of cervical region: Secondary | ICD-10-CM | POA: Diagnosis not present

## 2022-12-25 DIAGNOSIS — M9902 Segmental and somatic dysfunction of thoracic region: Secondary | ICD-10-CM | POA: Diagnosis not present

## 2022-12-25 DIAGNOSIS — M9905 Segmental and somatic dysfunction of pelvic region: Secondary | ICD-10-CM | POA: Diagnosis not present

## 2022-12-25 DIAGNOSIS — M47814 Spondylosis without myelopathy or radiculopathy, thoracic region: Secondary | ICD-10-CM | POA: Diagnosis not present

## 2022-12-25 DIAGNOSIS — M5032 Other cervical disc degeneration, mid-cervical region, unspecified level: Secondary | ICD-10-CM | POA: Diagnosis not present

## 2022-12-25 DIAGNOSIS — M9903 Segmental and somatic dysfunction of lumbar region: Secondary | ICD-10-CM | POA: Diagnosis not present

## 2022-12-25 DIAGNOSIS — M5136 Other intervertebral disc degeneration, lumbar region: Secondary | ICD-10-CM | POA: Diagnosis not present

## 2022-12-25 MED ORDER — CARVEDILOL 6.25 MG PO TABS
6.2500 mg | ORAL_TABLET | Freq: Two times a day (BID) | ORAL | 3 refills | Status: DC
Start: 2022-12-25 — End: 2023-08-06

## 2022-12-25 MED ORDER — CARVEDILOL 6.25 MG PO TABS
6.2500 mg | ORAL_TABLET | Freq: Two times a day (BID) | ORAL | 3 refills | Status: DC
Start: 2022-12-25 — End: 2022-12-25

## 2022-12-25 MED ORDER — AMLODIPINE BESYLATE 10 MG PO TABS: 10.0000 mg | ORAL_TABLET | Freq: Every day | ORAL | 3 refills | Status: DC

## 2022-12-25 NOTE — Progress Notes (Signed)
Patient was present with trouble with DM shoes  Shoes are too big patient reports  We will reorder SZ 8wd  items returned today and new shoes ordered  Inserts were left here on shelf   Danni Leabo Cped CFo, CFm

## 2022-12-25 NOTE — Patient Instructions (Signed)
[name] , Thank you for taking time to come for your Medicare Wellness Visit. I appreciate your ongoing commitment to your health goals. Please review the following plan we discussed and let me know if I can assist you in the future.   Referrals/Orders/Follow-Ups/Clinician Recommendations: none  This is a list of the screening recommended for you and due dates:  Health Maintenance  Topic Date Due   Zoster (Shingles) Vaccine (1 of 2) Never done   COVID-19 Vaccine (5 - 2023-24 season) 01/10/2022   Flu Shot  12/11/2022   Yearly kidney health urinalysis for diabetes  01/07/2023   Pneumonia Vaccine (1 of 1 - PCV) 06/19/2023*   Eye exam for diabetics  12/27/2022   Complete foot exam   01/07/2023   Hemoglobin A1C  03/14/2023   Yearly kidney function blood test for diabetes  09/11/2023   Medicare Annual Wellness Visit  12/25/2023   Colon Cancer Screening  01/10/2026   DTaP/Tdap/Td vaccine (2 - Td or Tdap) 11/06/2031   DEXA scan (bone density measurement)  Completed   HPV Vaccine  Aged Out   Hepatitis C Screening  Discontinued  *Topic was postponed. The date shown is not the original due date.    Advanced directives: (Declined) Advance directive discussed with you today.   Next Medicare Annual Wellness Visit scheduled for next year: No, office will schedule  Preventive Care 65 Years and Older, Female Preventive care refers to lifestyle choices and visits with your health care provider that can promote health and wellness. What does preventive care include? A yearly physical exam. This is also called an annual well check. Dental exams once or twice a year. Routine eye exams. Ask your health care provider how often you should have your eyes checked. Personal lifestyle choices, including: Daily care of your teeth and gums. Regular physical activity. Eating a healthy diet. Avoiding tobacco and drug use. Limiting alcohol use. Practicing safe sex. Taking low-dose aspirin every  day. Taking vitamin and mineral supplements as recommended by your health care provider. What happens during an annual well check? The services and screenings done by your health care provider during your annual well check will depend on your age, overall health, lifestyle risk factors, and family history of disease. Counseling  Your health care provider may ask you questions about your: Alcohol use. Tobacco use. Drug use. Emotional well-being. Home and relationship well-being. Sexual activity. Eating habits. History of falls. Memory and ability to understand (cognition). Work and work Astronomer. Reproductive health. Screening  You may have the following tests or measurements: Height, weight, and BMI. Blood pressure. Lipid and cholesterol levels. These may be checked every 5 years, or more frequently if you are over 38 years old. Skin check. Lung cancer screening. You may have this screening every year starting at age 28 if you have a 30-pack-year history of smoking and currently smoke or have quit within the past 15 years. Fecal occult blood test (FOBT) of the stool. You may have this test every year starting at age 39. Flexible sigmoidoscopy or colonoscopy. You may have a sigmoidoscopy every 5 years or a colonoscopy every 10 years starting at age 79. Hepatitis C blood test. Hepatitis B blood test. Sexually transmitted disease (STD) testing. Diabetes screening. This is done by checking your blood sugar (glucose) after you have not eaten for a while (fasting). You may have this done every 1-3 years. Bone density scan. This is done to screen for osteoporosis. You may have this done starting at  age 52. Mammogram. This may be done every 1-2 years. Talk to your health care provider about how often you should have regular mammograms. Talk with your health care provider about your test results, treatment options, and if necessary, the need for more tests. Vaccines  Your health care  provider may recommend certain vaccines, such as: Influenza vaccine. This is recommended every year. Tetanus, diphtheria, and acellular pertussis (Tdap, Td) vaccine. You may need a Td booster every 10 years. Zoster vaccine. You may need this after age 40. Pneumococcal 13-valent conjugate (PCV13) vaccine. One dose is recommended after age 46. Pneumococcal polysaccharide (PPSV23) vaccine. One dose is recommended after age 19. Talk to your health care provider about which screenings and vaccines you need and how often you need them. This information is not intended to replace advice given to you by your health care provider. Make sure you discuss any questions you have with your health care provider. Document Released: 05/25/2015 Document Revised: 01/16/2016 Document Reviewed: 02/27/2015 Elsevier Interactive Patient Education  2017 [employer].  Fall Prevention in the Home Falls can cause injuries. They can happen to people of all ages. There are many things you can do to make your home safe and to help prevent falls. What can I do on the outside of my home? Regularly fix the edges of walkways and driveways and fix any cracks. Remove anything that might make you trip as you walk through a door, such as a raised step or threshold. Trim any bushes or trees on the path to your home. Use bright outdoor lighting. Clear any walking paths of anything that might make someone trip, such as rocks or tools. Regularly check to see if handrails are loose or broken. Make sure that both sides of any steps have handrails. Any raised decks and porches should have guardrails on the edges. Have any leaves, snow, or ice cleared regularly. Use sand or salt on walking paths during winter. Clean up any spills in your garage right away. This includes oil or grease spills. What can I do in the bathroom? Use night lights. Install grab bars by the toilet and in the tub and shower. Do not use towel bars as grab  bars. Use non-skid mats or decals in the tub or shower. If you need to sit down in the shower, use a plastic, non-slip stool. Keep the floor dry. Clean up any water that spills on the floor as soon as it happens. Remove soap buildup in the tub or shower regularly. Attach bath mats securely with double-sided non-slip rug tape. Do not have throw rugs and other things on the floor that can make you trip. What can I do in the bedroom? Use night lights. Make sure that you have a light by your bed that is easy to reach. Do not use any sheets or blankets that are too big for your bed. They should not hang down onto the floor. Have a firm chair that has side arms. You can use this for support while you get dressed. Do not have throw rugs and other things on the floor that can make you trip. What can I do in the kitchen? Clean up any spills right away. Avoid walking on wet floors. Keep items that you use a lot in easy-to-reach places. If you need to reach something above you, use a strong step stool that has a grab bar. Keep electrical cords out of the way. Do not use floor polish or wax that makes floors  slippery. If you must use wax, use non-skid floor wax. Do not have throw rugs and other things on the floor that can make you trip. What can I do with my stairs? Do not leave any items on the stairs. Make sure that there are handrails on both sides of the stairs and use them. Fix handrails that are broken or loose. Make sure that handrails are as long as the stairways. Check any carpeting to make sure that it is firmly attached to the stairs. Fix any carpet that is loose or worn. Avoid having throw rugs at the top or bottom of the stairs. If you do have throw rugs, attach them to the floor with carpet tape. Make sure that you have a light switch at the top of the stairs and the bottom of the stairs. If you do not have them, ask someone to add them for you. What else can I do to help prevent  falls? Wear shoes that: Do not have high heels. Have rubber bottoms. Are comfortable and fit you well. Are closed at the toe. Do not wear sandals. If you use a stepladder: Make sure that it is fully opened. Do not climb a closed stepladder. Make sure that both sides of the stepladder are locked into place. Ask someone to hold it for you, if possible. Clearly mark and make sure that you can see: Any grab bars or handrails. First and last steps. Where the edge of each step is. Use tools that help you move around (mobility aids) if they are needed. These include: Canes. Walkers. Scooters. Crutches. Turn on the lights when you go into a dark area. Replace any light bulbs as soon as they burn out. Set up your furniture so you have a clear path. Avoid moving your furniture around. If any of your floors are uneven, fix them. If there are any pets around you, be aware of where they are. Review your medicines with your doctor. Some medicines can make you feel dizzy. This can increase your chance of falling. Ask your doctor what other things that you can do to help prevent falls. This information is not intended to replace advice given to you by your health care provider. Make sure you discuss any questions you have with your health care provider. Document Released: 02/22/2009 Document Revised: 10/04/2015 Document Reviewed: 06/02/2014 Elsevier Interactive Patient Education  2017 [employer].

## 2022-12-25 NOTE — Telephone Encounter (Signed)
-----   Message from [facility] sent at 12/25/2022 12:48 PM EDT ----- Thanks Catie.  Juliette Alcide and [place], can you please help with this?  Thanks Tiffany ----- Message ----- From: Alden Hipp, RPH-CPP Sent: 08/27/2022  11:35 AM EDT To: Chilton Si, MD  Hi Dr. Duke Salvia,   I reviewed meds with this patient as she failed a couple of Adherence metrics for her insurance last year. She confused herself when talking to you last; she has only been taking 1 tablet twice daily of carvedilol, not 1 and a half twice daily, but does confirm palpitations have been better recently on just 6.25 mg twice daily. She also got allopurinol and amlodipine confused, she ran out of allopurinol. She's continued to take amlodipine.   Can you update her orders to add back amlodipine 10 mg daily and adjust carvedilol to 6.25 mg 1 tablet twice daily?  Thanks!  Catie

## 2022-12-25 NOTE — Telephone Encounter (Signed)
Returned call to pharmacy to provide verbal clarification on prescription.

## 2022-12-25 NOTE — Progress Notes (Signed)
Subjective:   Jasmine Lopez is a 82 y.o. female who presents for Medicare Annual (Subsequent) preventive examination.  Visit Complete: Virtual  I connected with  SONALI FISTLER on 12/25/22 by a audio enabled telemedicine application and verified that I am speaking with the correct person using two identifiers.  Patient Location: Home  Provider Location: Office/Clinic  I discussed the limitations of evaluation and management by telemedicine. The patient expressed understanding and agreed to proceed.  Vital Signs: Unable to obtain new vitals due to this being a telehealth visit.  Review of Systems     Cardiac Risk Factors include: advanced age (>33men, >27 women);diabetes mellitus;dyslipidemia;hypertension     Objective:    Today's Vitals   There is no height or weight on file to calculate BMI.     12/25/2022   11:04 AM 12/04/2021    9:18 AM 10/24/2020    3:14 PM 10/13/2019    3:04 PM 02/14/2019   12:10 PM 10/14/2018   12:44 PM  Advanced Directives  Does Patient Have a Medical Advance Directive? No No No No Yes Yes  Type of Agricultural consultant;Living will Healthcare Power of Attorney  Copy of Healthcare Power of Attorney in Chart?     No - copy requested No - copy requested  Would patient like information on creating a medical advance directive?  Yes (MAU/Ambulatory/Procedural Areas - Information given) Yes (MAU/Ambulatory/Procedural Areas - Information given) No - Patient declined      Current Medications (verified) Outpatient Encounter Medications as of 12/25/2022  Medication Sig   allopurinol (ZYLOPRIM) 100 MG tablet Take 1 tablet (100 mg total) by mouth daily.   amLODipine (NORVASC) 5 MG tablet Take 5 mg by mouth daily.   Biotin 1000 MCG tablet Take 1,000 mcg by mouth daily.   carvedilol (COREG) 6.25 MG tablet Please take 1.5 tablets twice daily   cetirizine (ZYRTEC) 10 MG tablet Take 10 mg by mouth daily.   Cholecalciferol (VITAMIN D3)  125 MCG (5000 UT) CAPS Take 1 capsule by mouth daily.   Garlic 1000 MG CAPS Take 1 capsule by mouth daily.   meloxicam (MOBIC) 15 MG tablet Take 1 tablet (15 mg total) by mouth daily.   metFORMIN (GLUCOPHAGE-XR) 500 MG 24 hr tablet Take 1 tablet (500 mg total) by mouth daily.   Multiple Vitamin (MULTIVITAMIN) capsule Take 1 capsule by mouth daily.   rosuvastatin (CRESTOR) 20 MG tablet TAKE 1 TABLET(20 MG) BY MOUTH DAILY   valsartan-hydrochlorothiazide (DIOVAN HCT) 160-12.5 MG tablet Take 1 tablet by mouth daily.   Accu-Chek Softclix Lancets lancets Use to check blood sugars twice daily E11.69 (Patient not taking: Reported on 05/13/2022)   acetaminophen (TYLENOL) 500 MG tablet Take 500 mg by mouth every 6 (six) hours as needed. (Patient not taking: Reported on 08/27/2022)   albuterol (VENTOLIN HFA) 108 (90 Base) MCG/ACT inhaler 1-2 puffs every 6 (six) hours as needed for wheezing or shortness of breath. (Patient not taking: Reported on 08/27/2022)   blood glucose meter kit and supplies KIT Dispense based on patient and insurance preference. Use up to two times daily as directed. (FOR ICD-9 250.00, 250.01). (Patient not taking: Reported on 05/13/2022)   glucose blood (ACCU-CHEK SMARTVIEW) test strip Use to check blood sugars twice daily E11.69 (Patient not taking: Reported on 05/13/2022)   Semaglutide,0.25 or 0.5MG /DOS, (OZEMPIC, 0.25 OR 0.5 MG/DOSE,) 2 MG/1.5ML SOPN Inject 0.5 mg into the skin once a week. (Patient not taking: Reported  on 08/27/2022)   Facility-Administered Encounter Medications as of 12/25/2022  Medication   0.9 %  sodium chloride infusion    Allergies (verified) Ace inhibitors, Atorvastatin, Benadryl [diphenhydramine], and Peppermint flavor [flavoring agent]   History: Past Medical History:  Diagnosis Date   Anxiety    Breast cancer (HCC)    Cancer (HCC)    Diabetes mellitus without complication (HCC)    Dysphagia    GERD (gastroesophageal reflux disease) 10/12/2020   Hair loss  10/12/2020   Heart murmur    Hx of adenomatous polyp of colon 06/2015   Dr. Hyman B[facility]- [facility]   Hypertension    Irregular heart beat    Sleep apnea    SVT (supraventricular tachycardia) 08/11/2022   Past Surgical History:  Procedure Laterality Date   BREAST EXCISIONAL BIOPSY Bilateral    BREAST LUMPECTOMY Left 2016   Family History  Problem Relation Age of Onset   Heart failure Mother        CAD   COPD Mother    Hypertension Mother    Cancer Father    Rectal cancer Maternal Grandmother        Unsure   Colon cancer Maternal Grandmother    Heart attack Maternal Grandfather    Esophageal cancer Neg Hx    Stomach cancer Neg Hx    Breast cancer Neg Hx    Social History   Socioeconomic History   Marital status: Widowed    Spouse name: Not on file   Number of children: Not on file   Years of education: Not on file   Highest education level: Not on file  Occupational History   Occupation: retired  Tobacco Use   Smoking status: Never   Smokeless tobacco: Never  Vaping Use   Vaping status: Never Used  Substance and Sexual Activity   Alcohol use: Never   Drug use: Never   Sexual activity: Not Currently  Other Topics Concern   Not on file  Social History Narrative   Lives home alone.  Widowed.  Retired.  MS counseling education. 4 cups coffee/ wk.     Social Determinants of Health   Financial Resource Strain: Low Risk  (12/25/2022)   Overall Financial Resource Strain (CARDIA)    Difficulty of Paying Living Expenses: Not hard at all  Food Insecurity: No Food Insecurity (12/25/2022)   Hunger Vital Sign    Worried About Running Out of Food in the Last Year: Never true    Ran Out of Food in the Last Year: Never true  Transportation Needs: No Transportation Needs (12/25/2022)   PRAPARE - Administrator, Civil Service (Medical): No    Lack of Transportation (Non-Medical): No  Physical Activity: Insufficiently Active (12/25/2022)   Exercise Vital Sign     Days of Exercise per Week: 2 days    Minutes of Exercise per Session: 40 min  Stress: No Stress Concern Present (12/25/2022)   [employer] of Occupational Health - Occupational Stress Questionnaire    Feeling of Stress : Not at all  Social Connections: Moderately Isolated (12/25/2022)   Social Connection and Isolation Panel [NHANES]    Frequency of Communication with Friends and Family: More than three times a week    Frequency of Social Gatherings with Friends and Family: Once a week    Attends Religious Services: More than 4 times per year    Active Member of [employer] or Organizations: No    Attends Banker Meetings: Never  Marital Status: Widowed    Tobacco Counseling Counseling given: Not Answered   Clinical Intake:  Pre-visit preparation completed: Yes  Pain : No/denies pain     Nutritional Risks: None Diabetes: Yes CBG done?: No Did pt. bring in CBG monitor from home?: No  How often do you need to have someone help you when you read instructions, pamphlets, or other written materials from your doctor or pharmacy?: 1 - Never  Interpreter Needed?: No  Information entered by :: NAllen LPN   Activities of Daily Living    12/25/2022   10:58 AM  In your present state of health, do you have any difficulty performing the following activities:  Hearing? 0  Vision? 1  Comment has appointment with eye doctor  Difficulty concentrating or making decisions? 0  Walking or climbing stairs? 0  Dressing or bathing? 0  Doing errands, shopping? 0  Preparing Food and eating ? N  Using the Toilet? N  In the past six months, have you accidently leaked urine? N  Do you have problems with loss of bowel control? N  Managing your Medications? N  Managing your Finances? N  Housekeeping or managing your Housekeeping? N    Patient Care Team: [name], FNP as PCP - General (General Practice) Chilton Si, MD as PCP - Cardiology (Cardiology) Arta Silence, RPH-CPP (Pharmacist)  Indicate any recent Medical Services you may have received from other than [facility] providers in the past year (date may be approximate).     Assessment:   This is a routine wellness examination for Rosine.  Hearing/Vision screen Hearing Screening - Comments:: Denies hearing issues Vision Screening - Comments:: Regular eye exams, Dr. Chyrl Civatte  Dietary issues and exercise activities discussed:     Goals Addressed             This Visit's Progress    Patient Stated       12/25/2022, wants to lose weight and wants to strengthen legs and feet       Depression Screen    12/25/2022   11:07 AM 09/11/2022   10:24 AM 06/18/2022   10:47 AM 05/13/2022    9:40 AM 01/06/2022    9:30 AM 12/04/2021    9:21 AM 11/05/2021    9:05 AM  PHQ 2/9 Scores  PHQ - 2 Score 0 0 0 0 0 0 0  PHQ- 9 Score 0 0         Fall Risk    12/25/2022   11:06 AM 09/11/2022   10:24 AM 05/13/2022    9:40 AM 01/06/2022    9:29 AM 12/04/2021    9:19 AM  Fall Risk   Falls in the past year? 1 1 0 1 1  Comment tripped    passed out  Number falls in past yr: 1 0 0 0 0  Injury with Fall? 0 1 0 1 0  Risk for fall due to : No Fall Risks;History of fall(s);Medication side effect History of fall(s) No Fall Risks History of fall(s) Medication side effect  Follow up Falls prevention discussed Falls evaluation completed Falls evaluation completed Falls evaluation completed Falls evaluation completed;Education provided;Falls prevention discussed    MEDICARE RISK AT HOME:  Medicare Risk at Home - 12/25/22 1107     Any stairs in or around the home? Yes    If so, are there any without handrails? No    Home free of loose throw rugs in walkways, pet beds, electrical cords, etc?  Yes    Adequate lighting in your home to reduce risk of falls? Yes    Life alert? No    Use of a cane, walker or w/c? No    Grab bars in the bathroom? Yes    Shower chair or bench in shower? No    Elevated  toilet seat or a handicapped toilet? Yes             TIMED UP AND GO:  Was the test performed?  No    Cognitive Function:    11/06/2021    2:00 PM 11/06/2020    2:12 PM 11/01/2019    7:38 AM 05/17/2019    1:28 PM  MMSE - Mini Mental State Exam  Orientation to time 5 5 5 5   Orientation to Place 5 5 5 5   Registration 3 3 3 3   Attention/ Calculation 4 2 2 2   Recall 3 3 2 3   Language- name 2 objects 2 2 2 2   Language- repeat 1 1 1 1   Language- follow 3 step command 3 2 3 3   Language- read & follow direction 1 1 1 1   Write a sentence 1 1 1 1   Copy design 1 1 1  0  Copy design-comments   10 animals   Total score 29 26 26 26       03/10/2022    9:03 AM  Montreal Cognitive Assessment   Visuospatial/ Executive (0/5) 4  Naming (0/3) 3  Attention: Read list of digits (0/2) 2  Attention: Read list of letters (0/1) 1  Attention: Serial 7 subtraction starting at 100 (0/3) 3  Language: Repeat phrase (0/2) 1  Language : Fluency (0/1) 1  Abstraction (0/2) 2  Delayed Recall (0/5) 2  Orientation (0/6) 6  Total 25      12/25/2022   11:08 AM 12/04/2021    9:23 AM 11/05/2021   10:00 AM 10/24/2020    3:19 PM 10/13/2019    3:07 PM  6CIT Screen  What Year? 0 points 0 points 0 points 0 points 0 points  What month? 0 points 0 points 0 points 0 points 0 points  What time? 0 points 0 points 0 points 0 points 0 points  Count back from 20 0 points 0 points 0 points 2 points 0 points  Months in reverse 0 points 0 points 0 points 0 points 2 points  Repeat phrase 0 points 0 points 0 points 0 points 0 points  Total Score 0 points 0 points 0 points 2 points 2 points    Immunizations Immunization History  Administered Date(s) Administered   PFIZER(Purple Top)SARS-COV-2 Vaccination 07/04/2019, 07/25/2019, 02/17/2020   Pfizer Covid-19 Vaccine Bivalent Booster 69yrs & up 04/01/2021   Tdap 11/05/2021    TDAP status: Up to date  Flu Vaccine status: Due, Education has been provided regarding the  importance of this vaccine. Advised may receive this vaccine at local pharmacy or Health Dept. Aware to provide a copy of the vaccination record if obtained from local pharmacy or Health Dept. Verbalized acceptance and understanding.  Pneumococcal vaccine status: Declined,  Education has been provided regarding the importance of this vaccine but patient still declined. Advised may receive this vaccine at local pharmacy or Health Dept. Aware to provide a copy of the vaccination record if obtained from local pharmacy or Health Dept. Verbalized acceptance and understanding.   Covid-19 vaccine status: Information provided on how to obtain vaccines.   Qualifies for Shingles Vaccine? Yes   Zostavax completed No  Shingrix Completed?: No.    Education has been provided regarding the importance of this vaccine. Patient has been advised to call insurance company to determine out of pocket expense if they have not yet received this vaccine. Advised may also receive vaccine at local pharmacy or Health Dept. Verbalized acceptance and understanding.  Screening Tests Health Maintenance  Topic Date Due   Zoster Vaccines- Shingrix (1 of 2) Never done   COVID-19 Vaccine (5 - 2023-24 season) 01/10/2022   INFLUENZA VACCINE  12/11/2022   Diabetic kidney evaluation - Urine ACR  01/07/2023   Pneumonia Vaccine 50+ Years old (1 of 1 - PCV) 06/19/2023 (Originally 09/01/2007)   OPHTHALMOLOGY EXAM  12/27/2022   FOOT EXAM  01/07/2023   HEMOGLOBIN A1C  03/14/2023   Diabetic kidney evaluation - eGFR measurement  09/11/2023   Medicare Annual Wellness (AWV)  12/25/2023   Colonoscopy  01/10/2026   DTaP/Tdap/Td (2 - Td or Tdap) 11/06/2031   DEXA SCAN  Completed   HPV VACCINES  Aged Out   Hepatitis C Screening  Discontinued    Health Maintenance  Health Maintenance Due  Topic Date Due   Zoster Vaccines- Shingrix (1 of 2) Never done   COVID-19 Vaccine (5 - 2023-24 season) 01/10/2022   INFLUENZA VACCINE  12/11/2022    Diabetic kidney evaluation - Urine ACR  01/07/2023    Colorectal cancer screening: No longer required.   Mammogram status: Completed 03/17/2022. Repeat every year  Bone Density status: Completed 10/03/2020.  Lung Cancer Screening: (Low Dose CT Chest recommended if Age 75-80 years, 20 pack-year currently smoking OR have quit w/in 15years.) does not qualify.   Lung Cancer Screening Referral: no  Additional Screening:  Hepatitis C Screening: does not qualify;   Vision Screening: Recommended annual ophthalmology exams for early detection of glaucoma and other disorders of the eye. Is the patient up to date with their annual eye exam?  Yes  Who is the provider or what is the name of the office in which the patient attends annual eye exams? Dr. Chyrl Civatte If pt is not established with a provider, would they like to be referred to a provider to establish care? No .   Dental Screening: Recommended annual dental exams for proper oral hygiene  Diabetic Foot Exam: Diabetic Foot Exam: Completed 01/06/2022  Community Resource Referral / Chronic Care Management: CRR required this visit?  No   CCM required this visit?  No     Plan:     I have personally reviewed and noted the following in the patient's chart:   Medical and social history Use of alcohol, tobacco or illicit drugs  Current medications and supplements including opioid prescriptions. Patient is not currently taking opioid prescriptions. Functional ability and status Nutritional status Physical activity Advanced directives List of other physicians Hospitalizations, surgeries, and ER visits in previous 12 months Vitals Screenings to include cognitive, depression, and falls Referrals and appointments  In addition, I have reviewed and discussed with patient certain preventive protocols, quality metrics, and best practice recommendations. A written personalized care plan for preventive services as well as general preventive  health recommendations were provided to patient.     Barb Merino, LPN   1/61/0960   After Visit Summary: (MyChart) Due to this being a telephonic visit, the after visit summary with patients personalized plan was offered to patient via MyChart   Nurse Notes: none

## 2022-12-25 NOTE — Telephone Encounter (Signed)
Pt c/o medication issue:  1. Name of Medication:   carvedilol (COREG) 6.25 MG tablet    2. How are you currently taking this medication (dosage and times per day)? As written   3. Are you having a reaction (difficulty breathing--STAT)? No   4. What is your medication issue? Pharmacy called in asking for clarification on new instructions.

## 2022-12-29 ENCOUNTER — Ambulatory Visit: Payer: Self-pay | Admitting: Nurse Practitioner

## 2022-12-30 ENCOUNTER — Telehealth: Payer: Self-pay | Admitting: Podiatry

## 2022-12-30 NOTE — Telephone Encounter (Signed)
Lmom for pt to call back to schedule picking up her shoes.

## 2023-01-01 ENCOUNTER — Ambulatory Visit: Payer: Medicare PPO | Admitting: Nurse Practitioner

## 2023-01-01 DIAGNOSIS — H25813 Combined forms of age-related cataract, bilateral: Secondary | ICD-10-CM | POA: Diagnosis not present

## 2023-01-01 DIAGNOSIS — H40013 Open angle with borderline findings, low risk, bilateral: Secondary | ICD-10-CM | POA: Diagnosis not present

## 2023-01-01 LAB — HM DIABETES EYE EXAM

## 2023-01-05 ENCOUNTER — Encounter: Payer: Self-pay | Admitting: Nurse Practitioner

## 2023-01-05 ENCOUNTER — Ambulatory Visit (INDEPENDENT_AMBULATORY_CARE_PROVIDER_SITE_OTHER): Payer: Medicare PPO | Admitting: Nurse Practitioner

## 2023-01-05 VITALS — BP 132/68 | HR 74 | Temp 98.5°F | Ht 65.0 in | Wt 188.0 lb

## 2023-01-05 DIAGNOSIS — Z2821 Immunization not carried out because of patient refusal: Secondary | ICD-10-CM | POA: Insufficient documentation

## 2023-01-05 DIAGNOSIS — Z6831 Body mass index (BMI) 31.0-31.9, adult: Secondary | ICD-10-CM

## 2023-01-05 DIAGNOSIS — E1169 Type 2 diabetes mellitus with other specified complication: Secondary | ICD-10-CM

## 2023-01-05 DIAGNOSIS — E785 Hyperlipidemia, unspecified: Secondary | ICD-10-CM | POA: Diagnosis not present

## 2023-01-05 DIAGNOSIS — I10 Essential (primary) hypertension: Secondary | ICD-10-CM

## 2023-01-05 DIAGNOSIS — E66811 Obesity, class 1: Secondary | ICD-10-CM | POA: Insufficient documentation

## 2023-01-05 DIAGNOSIS — E6609 Other obesity due to excess calories: Secondary | ICD-10-CM | POA: Diagnosis not present

## 2023-01-05 LAB — LIPID PANEL
Chol/HDL Ratio: 3.5 ratio (ref 0.0–4.4)
Cholesterol, Total: 198 mg/dL (ref 100–199)
HDL: 57 mg/dL (ref >39)
LDL Chol Calc (NIH): 130 mg/dL — ABNORMAL HIGH (ref 0–99)
Triglycerides: 62 mg/dL (ref 0–149)
VLDL Cholesterol Cal: 11 mg/dL (ref 5–40)

## 2023-01-05 LAB — BASIC METABOLIC PANEL
BUN/Creatinine Ratio: 17 (ref 12–28)
BUN: 15 mg/dL (ref 8–27)
CO2: 25 mmol/L (ref 20–29)
Calcium: 9.9 mg/dL (ref 8.7–10.3)
Chloride: 100 mmol/L (ref 96–106)
Creatinine, Ser: 0.87 mg/dL (ref 0.57–1.00)
Glucose: 154 mg/dL — ABNORMAL HIGH (ref 70–99)
Potassium: 4.4 mmol/L (ref 3.5–5.2)
Sodium: 140 mmol/L (ref 134–144)
eGFR: 67 mL/min/{1.73_m2} (ref >59)

## 2023-01-05 LAB — HEMOGLOBIN A1C
Est. average glucose Bld gHb Est-mCnc: 169 mg/dL
Hgb A1c MFr Bld: 7.5 % — ABNORMAL HIGH (ref 4.8–5.6)

## 2023-01-05 MED ORDER — ROSUVASTATIN CALCIUM 20 MG PO TABS: ORAL_TABLET | ORAL | 1 refills | Status: DC

## 2023-01-05 MED ORDER — VALSARTAN-HYDROCHLOROTHIAZIDE 160-12.5 MG PO TABS
1.0000 | ORAL_TABLET | Freq: Every day | ORAL | 1 refills | Status: DC
Start: 2023-01-05 — End: 2023-08-06

## 2023-01-05 MED ORDER — METFORMIN HCL ER 500 MG PO TB24
500.0000 mg | ORAL_TABLET | Freq: Every day | ORAL | 1 refills | Status: DC
Start: 2023-01-05 — End: 2023-12-24

## 2023-01-05 NOTE — Assessment & Plan Note (Signed)
Declines shingrix, educated on disease process and is aware if he changes his mind to notify office  

## 2023-01-05 NOTE — Assessment & Plan Note (Signed)
Blood pressure is slightly elevated, encouraged to follow a low salt diet. Stay well hydrated with water

## 2023-01-05 NOTE — Assessment & Plan Note (Signed)
HgbA1c was slightly increased at last visit, she has not started the Ozempic due to concerns from when she was falling. Discussed importance of medication adherence and risk for cardiac events and microvascular damage with poorly controlled diabetes. We did provide her with Ozempic teaching today if her HgbA1c remains elevated will start the medication.

## 2023-01-05 NOTE — Progress Notes (Signed)
Madelaine Bhat, CMA,acting as a Neurosurgeon for [name], FNP.,have documented all relevant documentation on the behalf of [name], FNP,as directed by  [name], FNP while in the presence of [name], FNP.  Subjective:  Patient ID: Jasmine Lopez , female    DOB: 1941/01/31 , 82 y.o.   MRN: [id number]  Chief Complaint  Patient presents with   Hypertension   Diabetes    HPI  Patient presents today for a BP and DM follow up, Patient reports compliance with medications. Patient denies any chest pain, SOB, or headache. Patient has no concerns today.  Seen Dr. Dione Booze last week and cataracts is stable. She is planning to get her upper eyelids lifted. She is currently receiving PT for plantar fascitis.   BP Readings from Last 3 Encounters: 01/05/23 : 132/68 10/16/22 : 130/64 10/01/22 : 130/60   Wt Readings from Last 3 Encounters: 01/05/23 : 188 lb (85.3 kg) 10/16/22 : 188 lb 9.6 oz (85.5 kg) 09/11/22 : 184 lb 3.2 oz (83.6 kg)       Past Medical History:  Diagnosis Date   Anxiety    Breast cancer (HCC)    Cancer (HCC)    Diabetes mellitus without complication (HCC)    Dysphagia    GERD (gastroesophageal reflux disease) 10/12/2020   Hair loss 10/12/2020   Heart murmur    Hx of adenomatous polyp of colon 06/2015   Dr. Hyman [facility]ower- [facility]   Hypertension    Irregular heart beat    Sleep apnea    SVT (supraventricular tachycardia) 08/11/2022     Family History  Problem Relation Age of Onset   Heart failure Mother        CAD   COPD Mother    Hypertension Mother    Cancer Father    Rectal cancer Maternal Grandmother        Unsure   Colon cancer Maternal Grandmother    Heart attack Maternal Grandfather    Esophageal cancer Neg Hx    Stomach cancer Neg Hx    Breast cancer Neg Hx      Current Outpatient Medications:    allopurinol (ZYLOPRIM) 100 MG tablet, Take 1 tablet (100 mg total) by mouth daily., Disp: 90 tablet, Rfl: 1   amLODipine (NORVASC)  10 MG tablet, Take 1 tablet (10 mg total) by mouth daily., Disp: 90 tablet, Rfl: 3   Biotin 1000 MCG tablet, Take 1,000 mcg by mouth daily., Disp: , Rfl:    carvedilol (COREG) 6.25 MG tablet, Take 1 tablet (6.25 mg total) by mouth 2 (two) times daily with a meal., Disp: 180 tablet, Rfl: 3   cetirizine (ZYRTEC) 10 MG tablet, Take 10 mg by mouth daily., Disp: , Rfl:    Cholecalciferol (VITAMIN D3) 125 MCG (5000 UT) CAPS, Take 1 capsule by mouth daily., Disp: , Rfl:    Garlic 1000 MG CAPS, Take 1 capsule by mouth daily., Disp: , Rfl:    meloxicam (MOBIC) 15 MG tablet, Take 1 tablet (15 mg total) by mouth daily., Disp: 30 tablet, Rfl: 0   Multiple Vitamin (MULTIVITAMIN) capsule, Take 1 capsule by mouth daily., Disp: , Rfl:    metFORMIN (GLUCOPHAGE-XR) 500 MG 24 hr tablet, Take 1 tablet (500 mg total) by mouth daily., Disp: 90 tablet, Rfl: 1   rosuvastatin (CRESTOR) 20 MG tablet, TAKE 1 TABLET(20 MG) BY MOUTH DAILY, Disp: 90 tablet, Rfl: 1   Semaglutide,0.25 or 0.5MG /DOS, (OZEMPIC, 0.25 OR 0.5 MG/DOSE,) 2 MG/1.5ML SOPN, Inject  0.5 mg into the skin once a week. (Patient not taking: Reported on 08/27/2022), Disp: 4.5 mL, Rfl: 1   valsartan-hydrochlorothiazide (DIOVAN HCT) 160-12.5 MG tablet, Take 1 tablet by mouth daily., Disp: 90 tablet, Rfl: 1  Current Facility-Administered Medications:    0.9 %  sodium chloride infusion, 500 mL, Intravenous, Once, Armbruster, Willaim Rayas, MD   Allergies  Allergen Reactions   Ace Inhibitors Cough   Atorvastatin Other (See Comments)    Leg pain    Benadryl [Diphenhydramine] Itching    Benadryl cream   Peppermint Flavor [Flavoring Agent]     Causes horsenss     Review of Systems  Constitutional: Negative.   HENT: Negative.    Eyes: Negative.   Respiratory: Negative.    Cardiovascular: Negative.   Gastrointestinal: Negative.   Neurological: Negative.   Hematological: Negative.   Psychiatric/Behavioral: Negative.       Today's Vitals   01/05/23 0832  BP:  132/68  Pulse: 74  Temp: 98.5 F (36.9 C)  TempSrc: Oral  Weight: 188 lb (85.3 kg)  Height: 5\' 5"  (1.651 m)  PainSc: 0-No pain   Body mass index is 31.28 kg/m.  Wt Readings from Last 3 Encounters:  01/05/23 188 lb (85.3 kg)  10/16/22 188 lb 9.6 oz (85.5 kg)  09/11/22 184 lb 3.2 oz (83.6 kg)      Objective:  Physical Exam Vitals reviewed.  Constitutional:      General: She is not in acute distress.    Appearance: Normal appearance. She is obese.  Cardiovascular:     Rate and Rhythm: Normal rate and regular rhythm.     Pulses: Normal pulses.     Heart sounds: Normal heart sounds. No murmur heard. Pulmonary:     Effort: Pulmonary effort is normal. No respiratory distress.     Breath sounds: Normal air entry. No wheezing or rales.  Skin:    General: Skin is warm and dry.     Capillary Refill: Capillary refill takes less than 2 seconds.  Neurological:     General: No focal deficit present.     Mental Status: She is alert and oriented to person, place, and time.     Cranial Nerves: No cranial nerve deficit.     Motor: No weakness.  Psychiatric:        Mood and Affect: Mood normal.        Behavior: Behavior normal.        Thought Content: Thought content normal.        Judgment: Judgment normal.         Assessment And Plan:  Essential hypertension Assessment & Plan: Blood pressure is slightly elevated, encouraged to follow a low salt diet. Stay well hydrated with water  Orders: -     Basic metabolic panel -     Valsartan-hydroCHLOROthiazide; Take 1 tablet by mouth daily.  Dispense: 90 tablet; Refill: 1  Type 2 diabetes mellitus with hyperlipidemia (HCC) Assessment & Plan: HgbA1c was slightly increased at last visit, she has not started the Ozempic due to concerns from when she was falling. Discussed importance of medication adherence and risk for cardiac events and microvascular damage with poorly controlled diabetes. We did provide her with Ozempic teaching today  if her HgbA1c remains elevated will start the medication.   Orders: -     Hemoglobin A1c -     Microalbumin / creatinine urine ratio -     metFORMIN HCl ER; Take 1 tablet (500 mg total) by  mouth daily.  Dispense: 90 tablet; Refill: 1 -     Lipid panel  Class 1 obesity due to excess calories with body mass index (BMI) of 31.0 to 31.9 in adult, unspecified whether serious comorbidity present Assessment & Plan: She is encouraged to strive for BMI less than 30 to decrease cardiac risk. Advised to aim for at least 150 minutes of exercise per week.    Herpes zoster vaccination declined Assessment & Plan: Declines shingrix, educated on disease process and is aware if he changes his mind to notify office    Other orders -     Rosuvastatin Calcium; TAKE 1 TABLET(20 MG) BY MOUTH DAILY  Dispense: 90 tablet; Refill: 1    Return for controlled DM check 4 months.  Patient was given opportunity to ask questions. Patient verbalized understanding of the plan and was able to repeat key elements of the plan. All questions were answered to their satisfaction.    Jeanell Sparrow, FNP, have reviewed all documentation for this visit. The documentation on 01/05/23 for the exam, diagnosis, procedures, and orders are all accurate and complete.   IF YOU HAVE BEEN REFERRED TO A SPECIALIST, IT MAY TAKE 1-2 WEEKS TO SCHEDULE/PROCESS THE REFERRAL. IF YOU HAVE NOT HEARD FROM US/SPECIALIST IN TWO WEEKS, PLEASE GIVE [place] A CALL AT [phone].

## 2023-01-05 NOTE — Assessment & Plan Note (Signed)
She is encouraged to strive for BMI less than 30 to decrease cardiac risk. Advised to aim for at least 150 minutes of exercise per week.  

## 2023-01-06 LAB — MICROALBUMIN / CREATININE URINE RATIO
Creatinine, Urine: 141.8 mg/dL
Microalb/Creat Ratio: 4 mg/g{creat} (ref 0–29)
Microalbumin, Urine: 5.1 ug/mL

## 2023-01-09 DIAGNOSIS — E1136 Type 2 diabetes mellitus with diabetic cataract: Secondary | ICD-10-CM | POA: Diagnosis not present

## 2023-01-09 DIAGNOSIS — I10 Essential (primary) hypertension: Secondary | ICD-10-CM | POA: Diagnosis not present

## 2023-01-09 DIAGNOSIS — M722 Plantar fascial fibromatosis: Secondary | ICD-10-CM | POA: Diagnosis not present

## 2023-01-09 DIAGNOSIS — E785 Hyperlipidemia, unspecified: Secondary | ICD-10-CM | POA: Diagnosis not present

## 2023-01-09 DIAGNOSIS — F32A Depression, unspecified: Secondary | ICD-10-CM | POA: Diagnosis not present

## 2023-01-09 DIAGNOSIS — F419 Anxiety disorder, unspecified: Secondary | ICD-10-CM | POA: Diagnosis not present

## 2023-01-09 DIAGNOSIS — I8390 Asymptomatic varicose veins of unspecified lower extremity: Secondary | ICD-10-CM | POA: Diagnosis not present

## 2023-01-09 DIAGNOSIS — I499 Cardiac arrhythmia, unspecified: Secondary | ICD-10-CM | POA: Diagnosis not present

## 2023-01-09 DIAGNOSIS — K219 Gastro-esophageal reflux disease without esophagitis: Secondary | ICD-10-CM | POA: Diagnosis not present

## 2023-01-13 ENCOUNTER — Encounter: Payer: Self-pay | Admitting: Nurse Practitioner

## 2023-01-13 ENCOUNTER — Ambulatory Visit (INDEPENDENT_AMBULATORY_CARE_PROVIDER_SITE_OTHER): Payer: Medicare PPO | Admitting: Nurse Practitioner

## 2023-01-13 VITALS — BP 130/70 | HR 55 | Temp 98.4°F | Ht 65.0 in | Wt 189.2 lb

## 2023-01-13 DIAGNOSIS — Z0000 Encounter for general adult medical examination without abnormal findings: Secondary | ICD-10-CM | POA: Diagnosis not present

## 2023-01-13 DIAGNOSIS — I499 Cardiac arrhythmia, unspecified: Secondary | ICD-10-CM | POA: Diagnosis not present

## 2023-01-13 DIAGNOSIS — E782 Mixed hyperlipidemia: Secondary | ICD-10-CM

## 2023-01-13 DIAGNOSIS — Z79899 Other long term (current) drug therapy: Secondary | ICD-10-CM | POA: Diagnosis not present

## 2023-01-13 DIAGNOSIS — I10 Essential (primary) hypertension: Secondary | ICD-10-CM

## 2023-01-13 DIAGNOSIS — I8390 Asymptomatic varicose veins of unspecified lower extremity: Secondary | ICD-10-CM | POA: Diagnosis not present

## 2023-01-13 DIAGNOSIS — E6609 Other obesity due to excess calories: Secondary | ICD-10-CM

## 2023-01-13 DIAGNOSIS — E785 Hyperlipidemia, unspecified: Secondary | ICD-10-CM

## 2023-01-13 DIAGNOSIS — F419 Anxiety disorder, unspecified: Secondary | ICD-10-CM | POA: Diagnosis not present

## 2023-01-13 DIAGNOSIS — M722 Plantar fascial fibromatosis: Secondary | ICD-10-CM | POA: Diagnosis not present

## 2023-01-13 DIAGNOSIS — B079 Viral wart, unspecified: Secondary | ICD-10-CM

## 2023-01-13 DIAGNOSIS — Z7985 Long-term (current) use of injectable non-insulin antidiabetic drugs: Secondary | ICD-10-CM

## 2023-01-13 DIAGNOSIS — Z6831 Body mass index (BMI) 31.0-31.9, adult: Secondary | ICD-10-CM

## 2023-01-13 DIAGNOSIS — F32A Depression, unspecified: Secondary | ICD-10-CM | POA: Diagnosis not present

## 2023-01-13 DIAGNOSIS — E1169 Type 2 diabetes mellitus with other specified complication: Secondary | ICD-10-CM | POA: Diagnosis not present

## 2023-01-13 DIAGNOSIS — K219 Gastro-esophageal reflux disease without esophagitis: Secondary | ICD-10-CM | POA: Diagnosis not present

## 2023-01-13 DIAGNOSIS — E1136 Type 2 diabetes mellitus with diabetic cataract: Secondary | ICD-10-CM | POA: Diagnosis not present

## 2023-01-13 LAB — POCT URINALYSIS DIP (CLINITEK)
Bilirubin, UA: NEGATIVE
Blood, UA: NEGATIVE
Glucose, UA: 250 mg/dL — AB
Ketones, POC UA: NEGATIVE mg/dL
Nitrite, UA: NEGATIVE
POC PROTEIN,UA: NEGATIVE
Spec Grav, UA: 1.02 (ref 1.010–1.025)
Urobilinogen, UA: 0.2 U/dL
pH, UA: 7 (ref 5.0–8.0)

## 2023-01-13 NOTE — Progress Notes (Signed)
Jasmine Lopez, CMA,acting as a Neurosurgeon for Jasmine Felts, FNP.,have documented all relevant documentation on the behalf of Jasmine Felts, FNP,as directed by  Jasmine Felts, FNP while in the presence of Jasmine Felts, FNP.  Subjective:    Patient ID: Jasmine Lopez , female    DOB: [birth year] , 82 y.o.   MRN: [id number]  Chief Complaint  Patient presents with   Annual Exam    HPI  Patient presents today for HM, patient reports compliance with medications. Patient denies any chest pain, SOB, or headaches. Patient has a wart on her right hand middle finger.   Wt Readings from Last 3 Encounters: 01/13/23 : 189 lb 3.2 oz (85.8 kg) 01/05/23 : 188 lb (85.3 kg) 10/16/22 : 188 lb 9.6 oz (85.5 kg)       Past Medical History:  Diagnosis Date   Anxiety    Breast cancer (HCC)    Cancer (HCC)    Diabetes mellitus without complication (HCC)    Dysphagia    GERD (gastroesophageal reflux disease) 10/12/2020   Hair loss 10/12/2020   Heart murmur    Hx of adenomatous polyp of colon 06/2015   Dr. Hyman [facility]   Hypertension    Irregular heart beat    Sleep apnea    SVT (supraventricular tachycardia) 08/11/2022     Family History  Problem Relation Age of Onset   Heart failure Mother        CAD   COPD Mother    Hypertension Mother    Cancer Father    Rectal cancer Maternal Grandmother        Unsure   Colon cancer Maternal Grandmother    Heart attack Maternal Grandfather    Esophageal cancer Neg Hx    Stomach cancer Neg Hx    Breast cancer Neg Hx      Current Outpatient Medications:    allopurinol (ZYLOPRIM) 100 MG tablet, Take 1 tablet (100 mg total) by mouth daily., Disp: 90 tablet, Rfl: 1   amLODipine (NORVASC) 10 MG tablet, Take 1 tablet (10 mg total) by mouth daily., Disp: 90 tablet, Rfl: 3   Biotin 1000 MCG tablet, Take 1,000 mcg by mouth daily., Disp: , Rfl:    carvedilol (COREG) 6.25 MG tablet, Take 1 tablet (6.25 mg total) by mouth 2 (two) times daily with  a meal., Disp: 180 tablet, Rfl: 3   cetirizine (ZYRTEC) 10 MG tablet, Take 10 mg by mouth daily., Disp: , Rfl:    Cholecalciferol (VITAMIN D3) 125 MCG (5000 UT) CAPS, Take 1 capsule by mouth daily., Disp: , Rfl:    Garlic 1000 MG CAPS, Take 1 capsule by mouth daily., Disp: , Rfl:    meloxicam (MOBIC) 15 MG tablet, Take 1 tablet (15 mg total) by mouth daily., Disp: 30 tablet, Rfl: 0   metFORMIN (GLUCOPHAGE-XR) 500 MG 24 hr tablet, Take 1 tablet (500 mg total) by mouth daily., Disp: 90 tablet, Rfl: 1   Multiple Vitamin (MULTIVITAMIN) capsule, Take 1 capsule by mouth daily., Disp: , Rfl:    rosuvastatin (CRESTOR) 20 MG tablet, TAKE 1 TABLET(20 MG) BY MOUTH DAILY, Disp: 90 tablet, Rfl: 1   valsartan-hydrochlorothiazide (DIOVAN HCT) 160-12.5 MG tablet, Take 1 tablet by mouth daily., Disp: 90 tablet, Rfl: 1   Semaglutide,0.25 or 0.5MG /DOS, (OZEMPIC, 0.25 OR 0.5 MG/DOSE,) 2 MG/1.5ML SOPN, Inject 0.5 mg into the skin once a week. (Patient not taking: Reported on 08/27/2022), Disp: 4.5 mL, Rfl: 1  Current Facility-Administered Medications:  0.9 %  sodium chloride infusion, 500 mL, Intravenous, Once, Armbruster, Willaim Rayas, MD   Allergies  Allergen Reactions   Ace Inhibitors Cough   Atorvastatin Other (See Comments)    Leg pain    Benadryl [Diphenhydramine] Itching    Benadryl cream   Peppermint Flavor [Flavoring Agent]     Causes horsenss      The patient states she uses post menopausal status for birth control. No LMP recorded. Patient is postmenopausal.. Negative for Dysmenorrhea and Negative for Menorrhagia. Negative for: breast discharge, breast lump(s), breast pain and breast self exam. Associated symptoms include abnormal vaginal bleeding. Pertinent negatives include abnormal bleeding (hematology), anxiety, decreased libido, depression, difficulty falling sleep, dyspareunia, history of infertility, nocturia, sexual dysfunction, sleep disturbances, urinary incontinence, urinary urgency, vaginal  discharge and vaginal itching. Diet regular; will eat 2-3 meals a day, cuts back on white carbs.  The patient states her exercise level is minimal with yoga. She is in PT for her feet now. She is having PT on her right foot at [facility] and she will get PT with [facility] for her left foot.   The patient's tobacco use is:  Social History   Tobacco Use  Smoking Status Never  Smokeless Tobacco Never   She has been exposed to passive smoke. The patient's alcohol use is:  Social History   Substance and Sexual Activity  Alcohol Use Never     Review of Systems  Constitutional: Negative.   HENT: Negative.    Eyes: Negative.   Respiratory: Negative.    Cardiovascular: Negative.   Gastrointestinal: Negative.   Endocrine: Negative.   Genitourinary: Negative.   Musculoskeletal:  Positive for arthralgias (left foot pain).  Skin:  Positive for rash (He has an area on finger last year on left hand middle finger).  Allergic/Immunologic: Negative.   Neurological: Negative.   Hematological: Negative.   Psychiatric/Behavioral: Negative.       Today's Vitals   01/13/23 0935  BP: 130/70  Pulse: (!) 55  Temp: 98.4 F (36.9 C)  TempSrc: Oral  Weight: 189 lb 3.2 oz (85.8 kg)  Height: 5\' 5"  (1.651 m)  PainSc: 0-No pain   Body mass index is 31.48 kg/m.  Wt Readings from Last 3 Encounters:  01/13/23 189 lb 3.2 oz (85.8 kg)  01/05/23 188 lb (85.3 kg)  10/16/22 188 lb 9.6 oz (85.5 kg)     Objective:  Physical Exam Vitals reviewed.  Constitutional:      General: She is not in acute distress.    Appearance: Normal appearance. She is well-developed. She is obese.  HENT:     Head: Normocephalic and atraumatic.     Right Ear: Hearing, tympanic membrane, ear canal and external ear normal. There is no impacted cerumen.     Left Ear: Hearing, tympanic membrane, ear canal and external ear normal. There is no impacted cerumen.     Nose: Nose normal.     Mouth/Throat:     Mouth: Mucous  membranes are moist.  Eyes:     General: Lids are normal.     Extraocular Movements: Extraocular movements intact.     Conjunctiva/sclera: Conjunctivae normal.     Pupils: Pupils are equal, round, and reactive to light.     Funduscopic exam:    Right eye: No papilledema.        Left eye: No papilledema.  Neck:     Thyroid: No thyroid mass.     Vascular: No carotid bruit.  Cardiovascular:  Rate and Rhythm: Normal rate and regular rhythm.     Pulses: Normal pulses.     Heart sounds: Normal heart sounds. No murmur heard. Pulmonary:     Effort: Pulmonary effort is normal.     Breath sounds: Normal breath sounds.  Chest:     Chest wall: No mass.  Breasts:    Tanner Score is 5.     Right: Normal. No mass or tenderness.     Left: Normal. No mass or tenderness.  Abdominal:     General: Abdomen is flat. Bowel sounds are normal. There is no distension.     Palpations: Abdomen is soft.     Tenderness: There is no abdominal tenderness.  Musculoskeletal:        General: No swelling. Normal range of motion.     Cervical back: Full passive range of motion without pain, normal range of motion and neck supple.     Right lower leg: No edema.     Left lower leg: No edema.  Lymphadenopathy:     Upper Body:     Right upper body: No supraclavicular, axillary or pectoral adenopathy.     Left upper body: No supraclavicular, axillary or pectoral adenopathy.  Skin:    General: Skin is warm and dry.     Capillary Refill: Capillary refill takes less than 2 seconds.     Comments: Right middle finger with "wart like" since earlier this year.   Neurological:     General: No focal deficit present.     Mental Status: She is alert and oriented to person, place, and time.     Cranial Nerves: No cranial nerve deficit.     Sensory: No sensory deficit.     Motor: No weakness.  Psychiatric:        Mood and Affect: Mood normal.        Behavior: Behavior normal.        Thought Content: Thought content  normal.        Judgment: Judgment normal.         Assessment And Plan:     Encounter for annual health examination Assessment & Plan: Behavior modifications discussed and diet history reviewed.   Pt will continue to exercise regularly and modify diet with low GI, plant based foods and decrease intake of processed foods.  Recommend intake of daily multivitamin, Vitamin D, and calcium.  Recommend mammogram for preventive screenings, as well as recommend immunizations that include influenza, TDAP, and Shingles    Essential hypertension Assessment & Plan: Blood pressure is better controlled, continue current medications and focus on lifestyle modifications  Orders: -     EKG 12-Lead -     POCT URINALYSIS DIP (CLINITEK)  Type 2 diabetes mellitus with hyperlipidemia (HCC) Assessment & Plan: Hemoglobin A1c was elevated at last visit.  We have discussed again about taking Ozempic she is planning to start taking the medication after this visit.  Orders: -     EKG 12-Lead -     POCT URINALYSIS DIP (CLINITEK)  Mixed hyperlipidemia Assessment & Plan: LDL is not at goal she is advised to continue taking statin.  No labs this visit recently done in August.   Class 1 obesity due to excess calories with body mass index (BMI) of 31.0 to 31.9 in adult, unspecified whether serious comorbidity present Assessment & Plan: She is encouraged to strive for BMI less than 30 to decrease cardiac risk. Advised to aim for at least 150 minutes of  exercise per week.    Viral wart on finger Assessment & Plan: She has an area on her finger that has a wartlike appearance would like referral to dermatology.  Encouraged to use wart remover over-the-counter.  Orders: -     Ambulatory referral to Dermatology  Other long term (current) drug therapy -     CBC with Differential/Platelet -     Hepatic function panel     No follow-ups on file. Patient was given opportunity to ask questions. Patient  verbalized understanding of the plan and was able to repeat key elements of the plan. All questions were answered to their satisfaction.   Jasmine Felts, FNP  I, Jasmine Felts, FNP, have reviewed all documentation for this visit. The documentation on 01/13/23 for the exam, diagnosis, procedures, and orders are all accurate and complete.

## 2023-01-14 LAB — CBC WITH DIFFERENTIAL/PLATELET
Basophils Absolute: 0.1 10*3/uL (ref 0.0–0.2)
Basos: 1 %
EOS (ABSOLUTE): 0.2 10*3/uL (ref 0.0–0.4)
Eos: 4 %
Hematocrit: 40.9 % (ref 34.0–46.6)
Hemoglobin: 13.4 g/dL (ref 11.1–15.9)
Immature Grans (Abs): 0 10*3/uL (ref 0.0–0.1)
Immature Granulocytes: 0 %
Lymphocytes Absolute: 1.9 10*3/uL (ref 0.7–3.1)
Lymphs: 41 %
MCH: 29.4 pg (ref 26.6–33.0)
MCHC: 32.8 g/dL (ref 31.5–35.7)
MCV: 90 fL (ref 79–97)
Monocytes Absolute: 0.4 10*3/uL (ref 0.1–0.9)
Monocytes: 8 %
Neutrophils Absolute: 2.2 10*3/uL (ref 1.4–7.0)
Neutrophils: 46 %
Platelets: 245 10*3/uL (ref 150–450)
RBC: 4.56 x10E6/uL (ref 3.77–5.28)
RDW: 11.8 % (ref 11.7–15.4)
WBC: 4.8 10*3/uL (ref 3.4–10.8)

## 2023-01-14 LAB — HEPATIC FUNCTION PANEL
ALT: 14 IU/L (ref 0–32)
AST: 14 IU/L (ref 0–40)
Albumin: 4.4 g/dL (ref 3.8–4.8)
Alkaline Phosphatase: 72 IU/L (ref 44–121)
Bilirubin Total: 0.4 mg/dL (ref 0.0–1.2)
Bilirubin, Direct: <0.1 mg/dL (ref 0.00–0.40)
Total Protein: 7.1 g/dL (ref 6.0–8.5)

## 2023-01-15 ENCOUNTER — Ambulatory Visit: Payer: Medicare PPO

## 2023-01-15 DIAGNOSIS — M47817 Spondylosis without myelopathy or radiculopathy, lumbosacral region: Secondary | ICD-10-CM | POA: Diagnosis not present

## 2023-01-15 DIAGNOSIS — M9905 Segmental and somatic dysfunction of pelvic region: Secondary | ICD-10-CM | POA: Diagnosis not present

## 2023-01-15 DIAGNOSIS — M9903 Segmental and somatic dysfunction of lumbar region: Secondary | ICD-10-CM | POA: Diagnosis not present

## 2023-01-15 DIAGNOSIS — M9902 Segmental and somatic dysfunction of thoracic region: Secondary | ICD-10-CM | POA: Diagnosis not present

## 2023-01-15 DIAGNOSIS — M5032 Other cervical disc degeneration, mid-cervical region, unspecified level: Secondary | ICD-10-CM | POA: Diagnosis not present

## 2023-01-15 DIAGNOSIS — M5136 Other intervertebral disc degeneration, lumbar region: Secondary | ICD-10-CM | POA: Diagnosis not present

## 2023-01-15 DIAGNOSIS — M9901 Segmental and somatic dysfunction of cervical region: Secondary | ICD-10-CM | POA: Diagnosis not present

## 2023-01-15 DIAGNOSIS — M47814 Spondylosis without myelopathy or radiculopathy, thoracic region: Secondary | ICD-10-CM | POA: Diagnosis not present

## 2023-01-15 NOTE — Progress Notes (Signed)
Patient was here and PU new DM shoes for exchange patient is happy with fit will call office if any problems arise  Addison Bailey Cped, CFo, CFm

## 2023-01-22 DIAGNOSIS — I499 Cardiac arrhythmia, unspecified: Secondary | ICD-10-CM | POA: Diagnosis not present

## 2023-01-22 DIAGNOSIS — K219 Gastro-esophageal reflux disease without esophagitis: Secondary | ICD-10-CM | POA: Diagnosis not present

## 2023-01-22 DIAGNOSIS — F419 Anxiety disorder, unspecified: Secondary | ICD-10-CM | POA: Diagnosis not present

## 2023-01-22 DIAGNOSIS — E785 Hyperlipidemia, unspecified: Secondary | ICD-10-CM | POA: Diagnosis not present

## 2023-01-22 DIAGNOSIS — M722 Plantar fascial fibromatosis: Secondary | ICD-10-CM | POA: Diagnosis not present

## 2023-01-22 DIAGNOSIS — I8390 Asymptomatic varicose veins of unspecified lower extremity: Secondary | ICD-10-CM | POA: Diagnosis not present

## 2023-01-22 DIAGNOSIS — I10 Essential (primary) hypertension: Secondary | ICD-10-CM | POA: Diagnosis not present

## 2023-01-22 DIAGNOSIS — E1136 Type 2 diabetes mellitus with diabetic cataract: Secondary | ICD-10-CM | POA: Diagnosis not present

## 2023-01-22 DIAGNOSIS — F32A Depression, unspecified: Secondary | ICD-10-CM | POA: Diagnosis not present

## 2023-01-28 DIAGNOSIS — Z0000 Encounter for general adult medical examination without abnormal findings: Secondary | ICD-10-CM | POA: Insufficient documentation

## 2023-01-28 DIAGNOSIS — B079 Viral wart, unspecified: Secondary | ICD-10-CM | POA: Insufficient documentation

## 2023-01-28 NOTE — Assessment & Plan Note (Signed)
Hemoglobin A1c was elevated at last visit.  We have discussed again about taking Ozempic she is planning to start taking the medication after this visit.

## 2023-01-28 NOTE — Assessment & Plan Note (Signed)
Behavior modifications discussed and diet history reviewed.   Pt will continue to exercise regularly and modify diet with low GI, plant based foods and decrease intake of processed foods.  Recommend intake of daily multivitamin, Vitamin D, and calcium.  Recommend mammogram for preventive screenings, as well as recommend immunizations that include influenza, TDAP, and Shingles

## 2023-01-28 NOTE — Assessment & Plan Note (Signed)
She has an area on her finger that has a wartlike appearance would like referral to dermatology.  Encouraged to use wart remover over-the-counter.

## 2023-01-28 NOTE — Assessment & Plan Note (Signed)
LDL is not at goal she is advised to continue taking statin.  No labs this visit recently done in August.

## 2023-01-28 NOTE — Assessment & Plan Note (Signed)
Blood pressure is better controlled, continue current medications and focus on lifestyle modifications

## 2023-01-28 NOTE — Assessment & Plan Note (Signed)
She is encouraged to strive for BMI less than 30 to decrease cardiac risk. Advised to aim for at least 150 minutes of exercise per week.  

## 2023-01-30 DIAGNOSIS — K219 Gastro-esophageal reflux disease without esophagitis: Secondary | ICD-10-CM | POA: Diagnosis not present

## 2023-01-30 DIAGNOSIS — M722 Plantar fascial fibromatosis: Secondary | ICD-10-CM | POA: Diagnosis not present

## 2023-01-30 DIAGNOSIS — E1136 Type 2 diabetes mellitus with diabetic cataract: Secondary | ICD-10-CM | POA: Diagnosis not present

## 2023-01-30 DIAGNOSIS — I8390 Asymptomatic varicose veins of unspecified lower extremity: Secondary | ICD-10-CM | POA: Diagnosis not present

## 2023-01-30 DIAGNOSIS — F419 Anxiety disorder, unspecified: Secondary | ICD-10-CM | POA: Diagnosis not present

## 2023-01-30 DIAGNOSIS — I499 Cardiac arrhythmia, unspecified: Secondary | ICD-10-CM | POA: Diagnosis not present

## 2023-01-30 DIAGNOSIS — E785 Hyperlipidemia, unspecified: Secondary | ICD-10-CM | POA: Diagnosis not present

## 2023-01-30 DIAGNOSIS — F32A Depression, unspecified: Secondary | ICD-10-CM | POA: Diagnosis not present

## 2023-01-30 DIAGNOSIS — I10 Essential (primary) hypertension: Secondary | ICD-10-CM | POA: Diagnosis not present

## 2023-02-09 ENCOUNTER — Ambulatory Visit (INDEPENDENT_AMBULATORY_CARE_PROVIDER_SITE_OTHER): Payer: Medicare PPO | Admitting: Family

## 2023-02-09 ENCOUNTER — Encounter (HOSPITAL_BASED_OUTPATIENT_CLINIC_OR_DEPARTMENT_OTHER): Payer: Self-pay | Admitting: Family

## 2023-02-09 VITALS — BP 132/66 | HR 67 | Ht 63.5 in | Wt 188.0 lb

## 2023-02-09 DIAGNOSIS — H02421 Myogenic ptosis of right eyelid: Secondary | ICD-10-CM | POA: Diagnosis not present

## 2023-02-09 DIAGNOSIS — E782 Mixed hyperlipidemia: Secondary | ICD-10-CM

## 2023-02-09 DIAGNOSIS — H02413 Mechanical ptosis of bilateral eyelids: Secondary | ICD-10-CM | POA: Diagnosis not present

## 2023-02-09 DIAGNOSIS — E785 Hyperlipidemia, unspecified: Secondary | ICD-10-CM | POA: Diagnosis not present

## 2023-02-09 DIAGNOSIS — I471 Supraventricular tachycardia, unspecified: Secondary | ICD-10-CM

## 2023-02-09 DIAGNOSIS — E1169 Type 2 diabetes mellitus with other specified complication: Secondary | ICD-10-CM | POA: Diagnosis not present

## 2023-02-09 DIAGNOSIS — I6521 Occlusion and stenosis of right carotid artery: Secondary | ICD-10-CM | POA: Diagnosis not present

## 2023-02-09 DIAGNOSIS — H02411 Mechanical ptosis of right eyelid: Secondary | ICD-10-CM | POA: Diagnosis not present

## 2023-02-09 DIAGNOSIS — H0279 Other degenerative disorders of eyelid and periocular area: Secondary | ICD-10-CM | POA: Diagnosis not present

## 2023-02-09 DIAGNOSIS — H02412 Mechanical ptosis of left eyelid: Secondary | ICD-10-CM | POA: Diagnosis not present

## 2023-02-09 DIAGNOSIS — I10 Essential (primary) hypertension: Secondary | ICD-10-CM

## 2023-02-09 DIAGNOSIS — H02422 Myogenic ptosis of left eyelid: Secondary | ICD-10-CM | POA: Diagnosis not present

## 2023-02-09 DIAGNOSIS — H02834 Dermatochalasis of left upper eyelid: Secondary | ICD-10-CM | POA: Diagnosis not present

## 2023-02-09 DIAGNOSIS — H02423 Myogenic ptosis of bilateral eyelids: Secondary | ICD-10-CM | POA: Diagnosis not present

## 2023-02-09 DIAGNOSIS — H02831 Dermatochalasis of right upper eyelid: Secondary | ICD-10-CM | POA: Diagnosis not present

## 2023-02-09 NOTE — Progress Notes (Signed)
Cardiology Office Note:  .   Date:  02/09/2023  ID:  Jasmine Lopez, DOB [birth year], MRN [id number] PCP: [name], FNP  [facility] Providers Cardiologist:  Chilton Si, MD    History of Present Illness: .   Jasmine Lopez is a 82 y.o. female with a hx of hypertension, breast cancer, diabetes, syncope.   Previously lived in [place] but moved to [place] after the loss of her husband 02/2017 and her mother 06/2017.   Prior cardiac work-up and trial includes stress test around 2017 negative for ischemia.  Echocardiogram 11/17/2017 EF 65 to 70%, no LVH or intracavitary gradient.  No valvular heart disease.   Multiple changes have been made to try to manage her blood pressure.  She has been started on amlodipine and uptitrated to 10 mg.  Chlorthalidone discontinued as it was too difficult for her to split in half.  Metoprolol transition to carvedilol due to bradycardia.  Losartan transition to valsartan.   Syncopal event 11/01/2021 while organizing her home in [place].  Evaluated by neurology, CT head 11/08/2027 with mild generalized cortical atrophy which is likely typical for age, few foci of hypoattenuation in hemispheres consistent with mild chronic microvascular ischemic change typical for age, in no acute findings.  Subs and cardiac workup with 14-day ZIO 11/2021 with 15 episodes of SVT versus atrial tachycardia longest 30 seconds.  Coreg increased to 1.5 mg twice daily.  Echo 11/2021 LVEF 58%, LVH, normal diastolic parameters.  Carotid Doppler 11/2021 mild stenosis of the right ICA.  Aspirin 81 mg daily was added.  Seen 08/11/22 with BP well-controlled and palpitations were better since increasing carvedilol.   She presents today for follow-up independently. She is getting home health PT for plantar fascitis with improvement in her mobility. Monitoring BP periodically at home with readings often <130/80.  She has been modifying her diet to eat earlier int he evening to help  her blood sugar. Reports no shortness of breath nor dyspnea on exertion. Reports no chest pain, pressure, or tightness. No edema, orthopnea, PND. Reports no palpitations.  She is tolerating Ozempic without issue, about to finish her 0.25mg  doses. She did have interruption in Rosuvastatin but this has since been corrected.   ROS: Please see the history of present illness.    All other systems reviewed and are negative.   Studies Reviewed: .        Cardiac Studies & Procedures       ECHOCARDIOGRAM  ECHOCARDIOGRAM COMPLETE 11/27/2021  Narrative ECHOCARDIOGRAM REPORT    Patient Name:   Jasmine Lopez Date of Exam: 11/27/2021 Medical Rec #:  [id number]        Height:       63.0 in Accession #:    [id number]       Weight:       188.6 lb Date of Birth:  1941-03-30        BSA:          1.886 m Patient Age:    81 years         BP:           120/65 mmHg Patient Gender: F                HR:           58 bpm. Exam Location:  Outpatient  Procedure: 2D Echo, 3D Echo, Strain Analysis, Color Doppler and Cardiac Doppler  Indications:    Syncope and collapse  History:  Patient has no prior history of Echocardiogram examinations. Signs/Symptoms:Syncope; Risk Factors:Diabetes, Hypertension, Dyslipidemia and Non-Smoker.  Sonographer:    Jeryl Columbia RDCS Referring Phys: [id number] Draiden Mirsky S Shant Hence  IMPRESSIONS   1. Left ventricular ejection fraction by 3D volume is 58 %. The left ventricle has normal function. The left ventricle has no regional wall motion abnormalities. There is mild concentric left ventricular hypertrophy. Left ventricular diastolic parameters were normal. 2. Right ventricular systolic function is normal. The right ventricular size is normal. 3. The mitral valve is normal in structure. No evidence of mitral valve regurgitation. No evidence of mitral stenosis. 4. The aortic valve is normal in structure. Aortic valve regurgitation is not visualized. No aortic stenosis  is present. 5. The inferior vena cava is normal in size with greater than 50% respiratory variability, suggesting right atrial pressure of 3 mmHg.  FINDINGS Left Ventricle: Left ventricular ejection fraction by 3D volume is 58 %. The left ventricle has normal function. The left ventricle has no regional wall motion abnormalities. The left ventricular internal cavity size was small. There is mild concentric left ventricular hypertrophy. Left ventricular diastolic parameters were normal.  Right Ventricle: The right ventricular size is normal. No increase in right ventricular wall thickness. Right ventricular systolic function is normal.  Left Atrium: Left atrial size was normal in size.  Right Atrium: Right atrial size was normal in size.  Pericardium: There is no evidence of pericardial effusion.  Mitral Valve: The mitral valve is normal in structure. Mild mitral annular calcification. No evidence of mitral valve regurgitation. No evidence of mitral valve stenosis.  Tricuspid Valve: The tricuspid valve is normal in structure. Tricuspid valve regurgitation is not demonstrated. No evidence of tricuspid stenosis.  Aortic Valve: The aortic valve is normal in structure. Aortic valve regurgitation is not visualized. No aortic stenosis is present. Aortic valve mean gradient measures 4.0 mmHg. Aortic valve peak gradient measures 8.0 mmHg. Aortic valve area, by VTI measures 2.16 cm.  Pulmonic Valve: The pulmonic valve was normal in structure. Pulmonic valve regurgitation is not visualized. No evidence of pulmonic stenosis.  Aorta: The aortic root is normal in size and structure.  Venous: The inferior vena cava is normal in size with greater than 50% respiratory variability, suggesting right atrial pressure of 3 mmHg.  IAS/Shunts: No atrial level shunt detected by color flow Doppler.   LEFT VENTRICLE PLAX 2D LVIDd:         3.62 cm         Diastology LVIDs:         1.80 cm         LV e'  medial:    6.31 cm/s LV PW:         0.98 cm         LV E/e' medial:  12.2 LV IVS:        1.10 cm         LV e' lateral:   5.98 cm/s LVOT diam:     1.80 cm         LV E/e' lateral: 12.9 LV SV:         71 LV SV Index:   38 LVOT Area:     2.54 cm        3D Volume EF LV 3D EF:    Left ventricul ar ejection fraction by 3D volume is 58 %.  3D Volume EF: 3D EF:        58 % LV EDV:  109 ml LV ESV:       46 ml LV SV:        64 ml  RIGHT VENTRICLE RV Basal diam:  3.42 cm RV Mid diam:    2.95 cm RV S prime:     13.50 cm/s TAPSE (M-mode): 3.0 cm  LEFT ATRIUM             Index        RIGHT ATRIUM           Index LA diam:        3.80 cm 2.01 cm/m   RA Area:     12.10 cm LA Vol (A2C):   58.2 ml 30.86 ml/m  RA Volume:   28.70 ml  15.22 ml/m LA Vol (A4C):   49.8 ml 26.41 ml/m LA Biplane Vol: 56.0 ml 29.69 ml/m AORTIC VALVE AV Area (Vmax):    2.17 cm AV Area (Vmean):   2.09 cm AV Area (VTI):     2.16 cm AV Vmax:           141.00 cm/s AV Vmean:          95.100 cm/s AV VTI:            0.330 m AV Peak Grad:      8.0 mmHg AV Mean Grad:      4.0 mmHg LVOT Vmax:         120.00 cm/s LVOT Vmean:        78.200 cm/s LVOT VTI:          0.280 m LVOT/AV VTI ratio: 0.85  AORTA Ao Root diam: 2.50 cm Ao Asc diam:  3.10 cm  MITRAL VALVE MV Area (PHT): 2.66 cm     SHUNTS MV Decel Time: 285 msec     Systemic VTI:  0.28 m MV E velocity: 77.00 cm/s   Systemic Diam: 1.80 cm MV A velocity: 113.00 cm/s MV E/A ratio:  0.68  Kardie Tobb DO Electronically signed by Thomasene Ripple DO Signature Date/Time: 11/27/2021/4:43:39 PM    Final    MONITORS  LONG TERM MONITOR (3-14 DAYS) 12/02/2021  Narrative 14-day Zio monitor  Quality: Fair.  Baseline artifact. Predominant rhythm: Sinus rhythm Average heart rate: 69 bpm Max heart rate: 126 bpm Min heart rate: 46 bpm Pauses >2.5 seconds: None  58 episodes of SVT versus atrial tachycardia.  Max heart rate 193 bpm.  Longest episode  30 seconds.  Average heart rate 111 bpm. 2.2% PACs Rare PVCs.  Patient recorded symptoms at times correlating with SVT.   Tiffany C. Duke Salvia, MD, [facility] 12/17/2021 1:19 PM           Risk Assessment/Calculations:             Physical Exam:   VS:  BP 132/66   Pulse 67   Ht 5' 3.5" (1.613 m)   Wt 188 lb (85.3 kg)   SpO2 98%   BMI 32.78 kg/m    Wt Readings from Last 3 Encounters:  02/09/23 188 lb (85.3 kg)  01/13/23 189 lb 3.2 oz (85.8 kg)  01/05/23 188 lb (85.3 kg)    GEN: Well nourished, well developed in no acute distress NECK: No JVD; No carotid bruits CARDIAC: RRR, no murmurs, rubs, gallops RESPIRATORY:  Clear to auscultation without rales, wheezing or rhonchi  ABDOMEN: Soft, non-tender, non-distended EXTREMITIES:  No edema; No deformity   ASSESSMENT AND PLAN: .    Mild right carotid stenosis -carotid duplex 11/2021 with right 1-39%  stenosis.Continue Rosuvastatin.   Palpitations / SVT - Quiescent on Coreg 6.25mg  BID.   Hypertension -  BP well controlled. Continue current antihypertensive regimen Amlodipine 10mg  daily, Coreg 6.25mg  BID, Valsartan-hydrochlorothiazide 160-12.5mg  daily. Recommend aiming for 150 minutes of moderate intensity activity per week and following a heart healthy diet.    Hyperlipidemia -12/2022 LDL 130. She had interruption in Rosuvastatin which has since been corrected. Discussed importance of statin at length. She verbalizes understanding of importance and is agreeable to continue.   DM2 - Continue to follow with PCP. Appreciate inclusion of SGLT2i.    Dispo: follow up in 6 months with Dr. Duke Salvia   Signed, Alver Sorrow, NP

## 2023-02-09 NOTE — Patient Instructions (Addendum)
Medication Instructions:  Continue your current medications.   Please be sure you are taking your Rosuvastatin (Crestor) 20mg  daily.  You have refills of Amlodipine and Carvedilol at [facility]. Simply stop by to pick them up or call them.   *If you need a refill on your cardiac medications before your next appointment, please call your pharmacy*  Follow-Up: At [facility], you and your health needs are our priority.  As part of our continuing mission to provide you with exceptional heart care, we have created designated Provider Care Teams.  These Care Teams include your primary Cardiologist (physician) and Advanced Practice Providers (APPs -  Physician Assistants and Nurse Practitioners) who all work together to provide you with the care you need, when you need it.  We recommend signing up for the patient portal called "MyChart".  Sign up information is provided on this After Visit Summary.  MyChart is used to connect with patients for Virtual Visits (Telemedicine).  Patients are able to view lab/test results, encounter notes, upcoming appointments, etc.  Non-urgent messages can be sent to your provider as well.   To learn more about what you can do with MyChart, go to [email or web address].    Your next appointment:   6 month(s)  Provider:   Chilton Si, MD or Gillian Shields, NP    Other Instructions  Heart Healthy Diet Recommendations: A low-salt diet is recommended. Meats should be grilled, baked, or boiled. Avoid fried foods. Focus on lean protein sources like fish or chicken with vegetables and fruits. The American Heart Association is a Chief Technology Officer!  American Heart Association Diet and Lifeystyle Recommendations   Exercise recommendations: The American Heart Association recommends 150 minutes of moderate intensity exercise weekly. Try 30 minutes of moderate intensity exercise 4-5 times per week. This could include walking, jogging, or swimming.

## 2023-02-11 DIAGNOSIS — K219 Gastro-esophageal reflux disease without esophagitis: Secondary | ICD-10-CM | POA: Diagnosis not present

## 2023-02-11 DIAGNOSIS — F32A Depression, unspecified: Secondary | ICD-10-CM | POA: Diagnosis not present

## 2023-02-11 DIAGNOSIS — E785 Hyperlipidemia, unspecified: Secondary | ICD-10-CM | POA: Diagnosis not present

## 2023-02-11 DIAGNOSIS — E1136 Type 2 diabetes mellitus with diabetic cataract: Secondary | ICD-10-CM | POA: Diagnosis not present

## 2023-02-11 DIAGNOSIS — M722 Plantar fascial fibromatosis: Secondary | ICD-10-CM | POA: Diagnosis not present

## 2023-02-11 DIAGNOSIS — I499 Cardiac arrhythmia, unspecified: Secondary | ICD-10-CM | POA: Diagnosis not present

## 2023-02-11 DIAGNOSIS — F419 Anxiety disorder, unspecified: Secondary | ICD-10-CM | POA: Diagnosis not present

## 2023-02-11 DIAGNOSIS — I8390 Asymptomatic varicose veins of unspecified lower extremity: Secondary | ICD-10-CM | POA: Diagnosis not present

## 2023-02-11 DIAGNOSIS — I10 Essential (primary) hypertension: Secondary | ICD-10-CM | POA: Diagnosis not present

## 2023-02-17 ENCOUNTER — Other Ambulatory Visit: Payer: Self-pay | Admitting: Nurse Practitioner

## 2023-02-17 DIAGNOSIS — K219 Gastro-esophageal reflux disease without esophagitis: Secondary | ICD-10-CM | POA: Diagnosis not present

## 2023-02-17 DIAGNOSIS — I499 Cardiac arrhythmia, unspecified: Secondary | ICD-10-CM | POA: Diagnosis not present

## 2023-02-17 DIAGNOSIS — E1136 Type 2 diabetes mellitus with diabetic cataract: Secondary | ICD-10-CM | POA: Diagnosis not present

## 2023-02-17 DIAGNOSIS — F419 Anxiety disorder, unspecified: Secondary | ICD-10-CM | POA: Diagnosis not present

## 2023-02-17 DIAGNOSIS — F32A Depression, unspecified: Secondary | ICD-10-CM | POA: Diagnosis not present

## 2023-02-17 DIAGNOSIS — Z1231 Encounter for screening mammogram for malignant neoplasm of breast: Secondary | ICD-10-CM

## 2023-02-17 DIAGNOSIS — I10 Essential (primary) hypertension: Secondary | ICD-10-CM | POA: Diagnosis not present

## 2023-02-17 DIAGNOSIS — E785 Hyperlipidemia, unspecified: Secondary | ICD-10-CM | POA: Diagnosis not present

## 2023-02-17 DIAGNOSIS — M722 Plantar fascial fibromatosis: Secondary | ICD-10-CM | POA: Diagnosis not present

## 2023-02-17 DIAGNOSIS — I8390 Asymptomatic varicose veins of unspecified lower extremity: Secondary | ICD-10-CM | POA: Diagnosis not present

## 2023-02-24 DIAGNOSIS — F32A Depression, unspecified: Secondary | ICD-10-CM | POA: Diagnosis not present

## 2023-02-24 DIAGNOSIS — I499 Cardiac arrhythmia, unspecified: Secondary | ICD-10-CM | POA: Diagnosis not present

## 2023-02-24 DIAGNOSIS — I8390 Asymptomatic varicose veins of unspecified lower extremity: Secondary | ICD-10-CM | POA: Diagnosis not present

## 2023-02-24 DIAGNOSIS — K219 Gastro-esophageal reflux disease without esophagitis: Secondary | ICD-10-CM | POA: Diagnosis not present

## 2023-02-24 DIAGNOSIS — F419 Anxiety disorder, unspecified: Secondary | ICD-10-CM | POA: Diagnosis not present

## 2023-02-24 DIAGNOSIS — E785 Hyperlipidemia, unspecified: Secondary | ICD-10-CM | POA: Diagnosis not present

## 2023-02-24 DIAGNOSIS — I10 Essential (primary) hypertension: Secondary | ICD-10-CM | POA: Diagnosis not present

## 2023-02-24 DIAGNOSIS — M722 Plantar fascial fibromatosis: Secondary | ICD-10-CM | POA: Diagnosis not present

## 2023-02-24 DIAGNOSIS — E1136 Type 2 diabetes mellitus with diabetic cataract: Secondary | ICD-10-CM | POA: Diagnosis not present

## 2023-02-27 ENCOUNTER — Other Ambulatory Visit: Payer: Self-pay | Admitting: Nurse Practitioner

## 2023-03-03 DIAGNOSIS — M722 Plantar fascial fibromatosis: Secondary | ICD-10-CM | POA: Diagnosis not present

## 2023-03-03 DIAGNOSIS — F419 Anxiety disorder, unspecified: Secondary | ICD-10-CM | POA: Diagnosis not present

## 2023-03-03 DIAGNOSIS — E785 Hyperlipidemia, unspecified: Secondary | ICD-10-CM | POA: Diagnosis not present

## 2023-03-03 DIAGNOSIS — I10 Essential (primary) hypertension: Secondary | ICD-10-CM | POA: Diagnosis not present

## 2023-03-03 DIAGNOSIS — F32A Depression, unspecified: Secondary | ICD-10-CM | POA: Diagnosis not present

## 2023-03-03 DIAGNOSIS — I8390 Asymptomatic varicose veins of unspecified lower extremity: Secondary | ICD-10-CM | POA: Diagnosis not present

## 2023-03-03 DIAGNOSIS — K219 Gastro-esophageal reflux disease without esophagitis: Secondary | ICD-10-CM | POA: Diagnosis not present

## 2023-03-03 DIAGNOSIS — I499 Cardiac arrhythmia, unspecified: Secondary | ICD-10-CM | POA: Diagnosis not present

## 2023-03-03 DIAGNOSIS — E1136 Type 2 diabetes mellitus with diabetic cataract: Secondary | ICD-10-CM | POA: Diagnosis not present

## 2023-03-04 ENCOUNTER — Other Ambulatory Visit: Payer: Self-pay | Admitting: Nurse Practitioner

## 2023-03-04 ENCOUNTER — Other Ambulatory Visit: Payer: Self-pay

## 2023-03-04 DIAGNOSIS — E1169 Type 2 diabetes mellitus with other specified complication: Secondary | ICD-10-CM

## 2023-03-04 DIAGNOSIS — I10 Essential (primary) hypertension: Secondary | ICD-10-CM

## 2023-03-10 NOTE — Progress Notes (Unsigned)
ASSESSMENT AND PLAN 82 y.o. year old female     Mild cognitive impairment  MOCA 25/30  Overall stable,  Encourage moderate exercise  Return to clinic with nurse practitioner in 1 year     DIAGNOSTIC DATA (LABS, IMAGING, TESTING) - I reviewed patient records, labs, notes, testing and imaging myself where available.   Lab in Sept 2023, normal TSH,  LDL 125, A1 C 7.5, CBC, CMP, creat 0.81.  HISTORY  Jasmine Lopez is a 82 year old female, seen in request by his primary care physician Dr.Winslow, Elmarie Shiley for evaluation of mild cognitive impairment, she is accompanied by his son at today's clinical visit on May 17, 2019.   Past medical history hypertension,  hyperlipidemia,  diabetes,  left breast calcified mass surgery,   She retired as a Teacher, early years/pre at age 89, she went through excessive stress in early 2019, her husband, and her mother passed away few months apart, she moved from [place] to [place] to be close to her son, she lives independently, still driving, tends to rely on her GPS, she has good appetite, has trouble sleeping, tends to be sedentary,   She was noted to have gradual onset mild cognitive malfunction around 2016, she has word finding difficulties, sometimes difficulty to carry on her train of thoughts, only has mild memory difficulty noted.  There was no family history of memory loss   Laboratory evaluations in September 2020 showed normal CBC, CMP showed elevated glucose 136, LDL 132, normal thyroid functional test, A1c was 6.9  Se took herself off Wellbutrin, since being back on, feels her focus is better, processing better.    MRI of the brain was unremarkable, B12 was normal. MMSE 26/30 November 06 2020  Suffered syncope episode December 2023, while organizing her home in [place] in hot summer day, does feel woozy prior to passing out, had black eyes  MMSE 29/3228 2023  Update March 10, 2022, She is overall doing well,  Personally  reviewed CT head June 2023, generalized atrophy, mostly frontal, no acute abnormality  MRI of the brain January 2021, mild generalized atrophy small vessel disease no acute abnormality MRI of cervical and thoracic spine , multilevel degenerative changes, no significant canal stenosis, variable degree of foraminal narrowing most noticeable right C4-5, moderate on the left side, severe left C6-7,  Update March 11, 2023 SS:    REVIEW OF SYSTEMS: Out of a complete 14 system review of symptoms, the patient complains only of the following symptoms, and all other reviewed systems are negative.  See HPI  PHYSICAL EXAM  Vitals:   03/10/22 0857  BP: 129/69  Pulse: 62  Weight: 190 lb (86.2 kg)  Height: 5\' 5"  (1.651 m)   Body mass index is 31.62 kg/m.  Generalized: Well developed, in no acute distress, 2 black eyes    03/10/2022    9:03 AM  Montreal Cognitive Assessment   Visuospatial/ Executive (0/5) 4  Naming (0/3) 3  Attention: Read list of digits (0/2) 2  Attention: Read list of letters (0/1) 1  Attention: Serial 7 subtraction starting at 100 (0/3) 3  Language: Repeat phrase (0/2) 1  Language : Fluency (0/1) 1  Abstraction (0/2) 2  Delayed Recall (0/5) 2  Orientation (0/6) 6  Total 25     PHYSICAL EXAMNIATION:  Gen: NAD, conversant, well nourised, well groomed                     Cardiovascular: Regular rate rhythm, no  peripheral edema, warm, nontender. Eyes: Conjunctivae clear without exudates or hemorrhage Neck: Supple, no carotid bruits. Pulmonary: Clear to auscultation bilaterally   NEUROLOGICAL EXAM:  MENTAL STATUS: Speech/cognition: Awake, alert oriented to history taking and casual conversation  CRANIAL NERVES: CN II: Visual fields are full to confrontation.  Pupils are round equal and briskly reactive to light. CN III, IV, VI: extraocular movement are normal. No ptosis. CN V: Facial sensation is intact to pinprick in all 3 divisions bilaterally. Corneal  responses are intact.  CN VII: Face is symmetric with normal eye closure and smile. CN VIII: Hearing is normal to casual conversation CN IX, X: Palate elevates symmetrically. Phonation is normal. CN XI: Head turning and shoulder shrug are intact CN XII: Tongue is midline with normal movements and no atrophy.  MOTOR: There is no pronator drift of out-stretched arms. Muscle bulk and tone are normal. Muscle strength is normal.  REFLEXES: Reflexes are 2+ and symmetric at the biceps, triceps, knees, and ankles. Plantar responses are flexor.  SENSORY: Intact to light touch, pinprick, positional and vibratory sensation are intact in fingers and toes.  COORDINATION: Rapid alternating movements and fine finger movements are intact. There is no dysmetria on finger-to-nose and heel-knee-shin.    GAIT/STANCE: Posture is normal. Gait is steady with normal steps, base, arm swing, and turning. Heel and toe walking are normal. Tandem gait is normal.  Romberg is absent.    ALLERGIES: Allergies  Allergen Reactions   Ace Inhibitors Cough   Atorvastatin Other (See Comments)    Leg pain    Benadryl [Diphenhydramine] Itching    Benadryl cream   Peppermint Flavor [Flavoring Agent]     Causes horsenss    HOME MEDICATIONS: Outpatient Medications Prior to Visit  Medication Sig Dispense Refill   allopurinol (ZYLOPRIM) 100 MG tablet TAKE 1 TABLET(100 MG) BY MOUTH DAILY 90 tablet 1   amLODipine (NORVASC) 10 MG tablet Take 1 tablet (10 mg total) by mouth daily. 90 tablet 3   Biotin 1000 MCG tablet Take 1,000 mcg by mouth daily.     carvedilol (COREG) 6.25 MG tablet Take 1 tablet (6.25 mg total) by mouth 2 (two) times daily with a meal. 180 tablet 3   cetirizine (ZYRTEC) 10 MG tablet Take 10 mg by mouth daily.     Cholecalciferol (VITAMIN D3) 125 MCG (5000 UT) CAPS Take 1 capsule by mouth daily.     Garlic 1000 MG CAPS Take 1 capsule by mouth daily.     meloxicam (MOBIC) 15 MG tablet Take 1 tablet (15  mg total) by mouth daily. 30 tablet 0   metFORMIN (GLUCOPHAGE-XR) 500 MG 24 hr tablet Take 1 tablet (500 mg total) by mouth daily. 90 tablet 1   Multiple Vitamin (MULTIVITAMIN) capsule Take 1 capsule by mouth daily.     rosuvastatin (CRESTOR) 20 MG tablet TAKE 1 TABLET(20 MG) BY MOUTH DAILY 90 tablet 1   Semaglutide,0.25 or 0.5MG /DOS, (OZEMPIC, 0.25 OR 0.5 MG/DOSE,) 2 MG/1.5ML SOPN Inject 0.5 mg into the skin once a week. 4.5 mL 1   valsartan-hydrochlorothiazide (DIOVAN HCT) 160-12.5 MG tablet Take 1 tablet by mouth daily. 90 tablet 1   Facility-Administered Medications Prior to Visit  Medication Dose Route Frequency Provider Last Rate Last Admin   0.9 %  sodium chloride infusion  500 mL Intravenous Once Armbruster, Willaim Rayas, MD        PAST MEDICAL HISTORY: Past Medical History:  Diagnosis Date   Anxiety    Breast cancer (HCC)  Cancer (HCC)    Diabetes mellitus without complication (HCC)    Dysphagia    GERD (gastroesophageal reflux disease) 10/12/2020   Hair loss 10/12/2020   Heart murmur    Hx of adenomatous polyp of colon 06/2015   Dr. Hyman [facility]   Hypertension    Irregular heart beat    Sleep apnea    SVT (supraventricular tachycardia) 08/11/2022    PAST SURGICAL HISTORY: Past Surgical History:  Procedure Laterality Date   BREAST EXCISIONAL BIOPSY Bilateral    BREAST LUMPECTOMY Left 2016    FAMILY HISTORY: Family History  Problem Relation Age of Onset   Heart failure Mother        CAD   COPD Mother    Hypertension Mother    Cancer Father    Rectal cancer Maternal Grandmother        Unsure   Colon cancer Maternal Grandmother    Heart attack Maternal Grandfather    Esophageal cancer Neg Hx    Stomach cancer Neg Hx    Breast cancer Neg Hx     SOCIAL HISTORY: Social History   Socioeconomic History   Marital status: Widowed    Spouse name: Not on file   Number of children: Not on file   Years of education: Not on file   Highest education  level: Not on file  Occupational History   Occupation: retired  Tobacco Use   Smoking status: Never   Smokeless tobacco: Never  Vaping Use   Vaping status: Never Used  Substance and Sexual Activity   Alcohol use: Never   Drug use: Never   Sexual activity: Not Currently  Other Topics Concern   Not on file  Social History Narrative   Lives home alone.  Widowed.  Retired.  MS counseling education. 4 cups coffee/ wk.     Social Determinants of Health   Financial Resource Strain: Low Risk  (12/25/2022)   Overall Financial Resource Strain (CARDIA)    Difficulty of Paying Living Expenses: Not hard at all  Food Insecurity: No Food Insecurity (12/25/2022)   Hunger Vital Sign    Worried About Running Out of Food in the Last Year: Never true    Ran Out of Food in the Last Year: Never true  Transportation Needs: No Transportation Needs (12/25/2022)   PRAPARE - Administrator, Civil Service (Medical): No    Lack of Transportation (Non-Medical): No  Physical Activity: Insufficiently Active (12/25/2022)   Exercise Vital Sign    Days of Exercise per Week: 2 days    Minutes of Exercise per Session: 40 min  Stress: No Stress Concern Present (12/25/2022)   [employer] of Occupational Health - Occupational Stress Questionnaire    Feeling of Stress : Not at all  Social Connections: Moderately Isolated (12/25/2022)   Social Connection and Isolation Panel [NHANES]    Frequency of Communication with Friends and Family: More than three times a week    Frequency of Social Gatherings with Friends and Family: Once a week    Attends Religious Services: More than 4 times per year    Active Member of [employer] or Organizations: No    Attends Banker Meetings: Never    Marital Status: Widowed  Intimate Partner Violence: Not At Risk (12/25/2022)   Humiliation, Afraid, Rape, and Kick questionnaire    Fear of Current or Ex-Partner: No    Emotionally Abused: No    Physically Abused:  No    Sexually  Abused: No   Otila Kluver, DNP  [facility] [address], [address] [place], [place] [address] [phone]

## 2023-03-11 ENCOUNTER — Ambulatory Visit (INDEPENDENT_AMBULATORY_CARE_PROVIDER_SITE_OTHER): Payer: Medicare PPO | Admitting: Neurology

## 2023-03-11 ENCOUNTER — Telehealth: Payer: Self-pay

## 2023-03-11 ENCOUNTER — Encounter: Payer: Self-pay | Admitting: Neurology

## 2023-03-11 VITALS — BP 151/76 | HR 71 | Ht 63.0 in | Wt 191.4 lb

## 2023-03-11 DIAGNOSIS — G3184 Mild cognitive impairment, so stated: Secondary | ICD-10-CM

## 2023-03-11 NOTE — Patient Instructions (Signed)
Will check ATN profile screen for Alzheimer's pathology.  Recommend continue exercise.  Recommend healthy eating, drinking plenty of water, brain stimulating activity to promote overall brain health.  Keep close follow-up with primary care provider.  Follow-up with me in 1 year.

## 2023-03-11 NOTE — Progress Notes (Signed)
   Care Guide Note  03/11/2023 Name: Jasmine Lopez MRN: [id number] DOB: 11-26-1940  Referred by: [name], FNP Reason for referral : Care Coordination (Outreach to schedule Pharm d )   Jasmine Lopez is a 82 y.o. year old female who is a primary care patient of [name], FNP. Jasmine Lopez was referred to the pharmacist for assistance related to DM.    Successful contact was made with the patient to discuss pharmacy services including being ready for the pharmacist to call at least 5 minutes before the scheduled appointment time, to have medication bottles and any blood sugar or blood pressure readings ready for review. The patient agreed to meet with the pharmacist via with the pharmacist via telephone visit on (date/time).  03/30/2023  [name] Lash, RMA Care Guide [facility]  [place], [place] [address] Direct Dial: [phone] [email or web address]

## 2023-03-12 ENCOUNTER — Encounter: Payer: Self-pay | Admitting: Podiatry

## 2023-03-12 ENCOUNTER — Ambulatory Visit (INDEPENDENT_AMBULATORY_CARE_PROVIDER_SITE_OTHER): Payer: Medicare PPO | Admitting: Podiatry

## 2023-03-12 ENCOUNTER — Ambulatory Visit (INDEPENDENT_AMBULATORY_CARE_PROVIDER_SITE_OTHER): Payer: Medicare PPO

## 2023-03-12 VITALS — Ht 63.0 in | Wt 191.4 lb

## 2023-03-12 DIAGNOSIS — M19071 Primary osteoarthritis, right ankle and foot: Secondary | ICD-10-CM | POA: Diagnosis not present

## 2023-03-12 DIAGNOSIS — M722 Plantar fascial fibromatosis: Secondary | ICD-10-CM

## 2023-03-12 DIAGNOSIS — M779 Enthesopathy, unspecified: Secondary | ICD-10-CM

## 2023-03-12 MED ORDER — TRIAMCINOLONE ACETONIDE 10 MG/ML IJ SUSP
10.0000 mg | Freq: Once | INTRAMUSCULAR | Status: AC
  Administered 2023-03-12: 10 mg via INTRA_ARTICULAR
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
## 2023-03-14 LAB — ATN PROFILE
A -- Beta-amyloid 42/40 Ratio: 0.122 (ref >0.102)
Beta-amyloid 40: 123.26 pg/mL
Beta-amyloid 42: 15.06 pg/mL
N -- NfL, Plasma: 2.49 pg/mL (ref 0.00–11.55)
T -- p-tau181: 0.69 pg/mL (ref 0.00–0.97)

## 2023-03-16 ENCOUNTER — Encounter: Payer: Self-pay | Admitting: Neurology

## 2023-03-17 ENCOUNTER — Ambulatory Visit: Payer: Medicare PPO | Admitting: Podiatry

## 2023-03-19 ENCOUNTER — Ambulatory Visit
Admission: RE | Admit: 2023-03-19 | Discharge: 2023-03-19 | Disposition: A | Discharge status: Home / Self Care | Payer: Medicare PPO | Source: Ambulatory Visit | Attending: Nurse Practitioner | Admitting: Nurse Practitioner

## 2023-03-19 DIAGNOSIS — Z1231 Encounter for screening mammogram for malignant neoplasm of breast: Secondary | ICD-10-CM | POA: Diagnosis not present

## 2023-03-30 ENCOUNTER — Encounter: Payer: Self-pay | Admitting: Pharmacist

## 2023-03-30 ENCOUNTER — Other Ambulatory Visit: Payer: Medicare PPO | Admitting: Pharmacist

## 2023-03-30 VITALS — BP 126/64 | HR 68

## 2023-03-30 DIAGNOSIS — E1169 Type 2 diabetes mellitus with other specified complication: Secondary | ICD-10-CM

## 2023-03-30 DIAGNOSIS — L6681 Central centrifugal cicatricial alopecia: Secondary | ICD-10-CM | POA: Diagnosis not present

## 2023-03-30 DIAGNOSIS — I10 Essential (primary) hypertension: Secondary | ICD-10-CM

## 2023-03-30 NOTE — Progress Notes (Addendum)
04/01/2023 Name: Jasmine Lopez MRN: [id number] DOB: 1940-07-23  Chief Complaint  Patient presents with   Medication Management   Diabetes    Jasmine Lopez is a 82 y.o. year old female who presented for a telephone visit.   They were referred to the pharmacist by their PCP for assistance in managing diabetes, hypertension, and hyperlipidemia.    Subjective:  Care Team: Primary Care Provider: [name], FNP ; Next Scheduled Visit: 05/11/23 Cardiologist: Chilton Si, MD; Next Scheduled Visit: 08/06/23  Medication Access/Adherence  Current Pharmacy:  [id number] [place], [place] - [address] AT [facility] [address] [place] [address] Phone: [phone] Fax: [phone]   Patient reports affordability concerns with their medications: Yes - Ozempic copay $400  Patient reports access/transportation concerns to their pharmacy: No  Patient reports adherence concerns with their medications:  Yes - only taking carvedilol once daily, not currently taking rosuvastatin    Diabetes:  Current medications: Ozempic 0.25 mg weekly (last dose on 03/17/23, total of 8 weekly injections), metformin XR 500 mg daily Medications tried in the past: metformin XR 500 mg BID (fecal urgency)   Hypertension:  Current medications: amlodipine 10 mg daily, valsartan-HCTZ 160-12.5 mg daily, carvedilol 6.25 mg BID (only taking once daily)  Patient has a validated, automated, upper arm home BP cuff Current blood pressure readings: 146/71 on 03/30/23 (reported range of recent BP readings 130-135/60-70)    Hyperlipidemia/ASCVD Risk Reduction  Current lipid lowering medications: rosuvastatin 20 mg (not taking, unclear duration of interruption in therapy)   Objective:  Lab Results  Component Value Date   HGBA1C 7.5 (H) 01/05/2023    Lab Results  Component Value Date   CREATININE 0.87 01/05/2023   BUN 15 01/05/2023   NA 140 01/05/2023   K 4.4  01/05/2023   CL 100 01/05/2023   CO2 25 01/05/2023    Lab Results  Component Value Date   CHOL 198 01/05/2023   HDL 57 01/05/2023   LDLCALC 130 (H) 01/05/2023   TRIG 62 01/05/2023   CHOLHDL 3.5 01/05/2023    Medications Reviewed Today     Reviewed by Alden Hipp, RPH-CPP (Pharmacist) on 03/30/23 at 1110  Med List Status: <None>   Medication Order Taking? Sig Documenting Provider Last Dose Status Informant  0.9 %  sodium chloride infusion [id number]   Armbruster, Willaim Rayas, MD  Active   allopurinol (ZYLOPRIM) 100 MG tablet [id number] Yes TAKE 1 TABLET(100 MG) BY MOUTH DAILY [name], FNP Taking Active   amLODipine (NORVASC) 10 MG tablet [id number] Yes Take 1 tablet (10 mg total) by mouth daily. Chilton Si, MD Taking Active   Biotin 1000 MCG tablet [id number] Yes Take 1,000 mcg by mouth daily. [provider] Taking Active   carvedilol (COREG) 6.25 MG tablet [id number] Yes Take 1 tablet (6.25 mg total) by mouth 2 (two) times daily with a meal. Chilton Si, MD Taking Active            Med Note Kinnie Scales Mar 30, 2023 10:51 AM) Taking one 6.25 mg tab every morning  cetirizine (ZYRTEC) 10 MG tablet [id number] Yes Take 10 mg by mouth daily. [provider] Taking Active   Cholecalciferol (VITAMIN D3) 125 MCG (5000 UT) CAPS [id number] Yes Take 1 capsule by mouth daily. [provider] Taking Active   Garlic 1000 MG CAPS [id number] Yes Take 1 capsule by mouth daily. [provider] Taking Active   metFORMIN (GLUCOPHAGE-XR) 500  MG 24 hr tablet [id number] Yes Take 1 tablet (500 mg total) by mouth daily. [name], FNP Taking Active   Multiple Vitamin (MULTIVITAMIN) capsule [id number] Yes Take 1 capsule by mouth daily. [provider] Taking Active   rosuvastatin (CRESTOR) 20 MG tablet [id number] No TAKE 1 TABLET(20 MG) BY MOUTH DAILY  Patient not taking: Reported on 03/30/2023   [name], FNP Not Taking Active    Semaglutide,0.25 or 0.5MG /DOS, (OZEMPIC, 0.25 OR 0.5 MG/DOSE,) 2 MG/1.5ML SOPN [id number] No Inject 0.5 mg into the skin once a week.  Patient not taking: Reported on 03/30/2023   [name], FNP Not Taking Active            Med Note Kinnie Scales Mar 30, 2023 10:52 AM) 0.25 mg sample ran out, last dose was 03/17/2023  valsartan-hydrochlorothiazide (DIOVAN HCT) 160-12.5 MG tablet [id number] Yes Take 1 tablet by mouth daily. [name], FNP Taking Active               Assessment/Plan:   Diabetes: - Currently uncontrolled based on most recent A1c of 7.5% (goal < 7%)  - Recommend to resume Ozempic 0.25 mg weekly x 2 weeks then increase dose to Ozempic 0.5 mg weekly. Continue metformin XR 500 mg daily.  - Discussed cost concerns associated with Ozempic. Patient does not meet financial criteria for Ozempic patient assistance program through [employer]. Patient reports copay of $400 for 28 ds of Ozempic with current insurance plan. Patient to discuss alternative insurance plans with insurance company.      Hypertension: - Currently uncontrolled based on home BP reading during today's visit above goal BP < 130/80 - Reviewed long term cardiovascular and renal outcomes of uncontrolled blood pressure - Reviewed appropriate blood pressure monitoring technique and reviewed goal blood pressure. Recommended to check home blood pressure and heart rate periodically - Recommend to continue amlodipine 10 mg daily, valsartan-HCTZ 160-12.5 mg daily, carvedilol 6.25 mg BID.  - Encouraged patient to take carvedilol twice daily as prescribed. Discussed strategies to improve medication adherence. Patient is filling PM slot of pill box with evening dose of carvedilol but often forgets to take the dose since it is the only medication she takes in the evening. Suggested moving pill box to a more convenient location and taking multivitamin in evening with carvedilol.        Hyperlipidemia/ASCVD Risk Reduction: - Currently uncontrolled due to interruption in statin therapy. - Recommend to resume rosuvastatin 20 mg daily. Contacted pharmacy to request refill and informed patient.    Follow Up Plan: phone call in 8 weeks  Catie TClearance Coots, PharmD, BCACP, CPP Clinical Pharmacist [facility] [phone]

## 2023-03-31 ENCOUNTER — Ambulatory Visit (INDEPENDENT_AMBULATORY_CARE_PROVIDER_SITE_OTHER): Payer: Medicare PPO | Admitting: Podiatry

## 2023-03-31 ENCOUNTER — Encounter: Payer: Self-pay | Admitting: Podiatry

## 2023-03-31 DIAGNOSIS — M79675 Pain in left toe(s): Secondary | ICD-10-CM | POA: Diagnosis not present

## 2023-03-31 DIAGNOSIS — E785 Hyperlipidemia, unspecified: Secondary | ICD-10-CM | POA: Diagnosis not present

## 2023-03-31 DIAGNOSIS — B351 Tinea unguium: Secondary | ICD-10-CM

## 2023-03-31 DIAGNOSIS — M79674 Pain in right toe(s): Secondary | ICD-10-CM

## 2023-03-31 DIAGNOSIS — E1169 Type 2 diabetes mellitus with other specified complication: Secondary | ICD-10-CM

## 2023-04-05 NOTE — Progress Notes (Signed)
  Subjective:  Patient ID: Jasmine Lopez, female    DOB: [birth year],  MRN: [id number]  82 y.o. female presents preventative diabetic foot care and painful, discolored, thick toenails which interfere with daily activities Chief Complaint  Patient presents with   Diabetes    DFC BS - DIDN'T CHECK A1C - 6.9   New problem(s): None   PCP is [name], FNP. Allergies  Allergen Reactions   Ace Inhibitors Cough   Atorvastatin Other (See Comments)    Leg pain    Benadryl [Diphenhydramine] Itching    Benadryl cream   Peppermint Flavor [Flavoring Agent]     Causes horsenss    Review of Systems: Negative except as noted in the HPI.   Objective:  Jasmine Lopez is a pleasant 82 y.o. female WD, WN in NAD. AAO x 3.  Vascular Examination: Capillary refill time <3 seconds b/l LE. Palpable pedal pulses b/l LE. Digital hair present b/l.  Skin temperature gradient WNL b/l.  +1 pitting edema noted BLE.Marland Kitchen  Neurological Examination: Sensation grossly intact b/l with 10 gram monofilament. Vibratory sensation intact b/l.  Dermatological Examination: Pedal skin with normal turgor, texture and tone b/l. No open wounds nor interdigital macerations noted. Toenails 1-5 b/l thick, discolored, elongated with subungual debris and pain on dorsal palpation. No hyperkeratotic lesions noted b/l.   Musculoskeletal Examination: Muscle strength 5/5 to b/l LE.  No pain, crepitus noted b/l. No gross pedal deformities. Patient ambulates independently without assistive aids.   Radiographs: None Last A1c:      Latest Ref Rng & Units 01/05/2023    9:11 AM 09/11/2022   11:22 AM 05/13/2022   10:42 AM  Hemoglobin A1C  Hemoglobin-A1c 4.8 - 5.6 % 7.5  8.0  7.9      Assessment:   1. Pain due to onychomycosis of toenails of both feet   2. Type 2 diabetes mellitus with hyperlipidemia (HCC)    Plan:  -Consent given for treatment as described below: -Examined patient. -Patient to continue soft, supportive  shoe gear daily. -Mycotic toenails 1-5 bilaterally were debrided in length and girth with sterile nail nippers and dremel without incident. -Patient/POA to call should there be question/concern in the interim.  Return in about 3 months (around 07/01/2023).  Freddie Breech, DPM      [place] LOCATION: [address] [place] [address]                   Office [phone]   [facility] LOCATION: [address] [place], [place] [address] Office [phone]

## 2023-04-16 ENCOUNTER — Encounter: Payer: Self-pay | Admitting: Family Medicine

## 2023-04-16 ENCOUNTER — Ambulatory Visit: Payer: Medicare PPO | Admitting: Family Medicine

## 2023-04-16 VITALS — BP 130/78 | HR 70 | Temp 98.2°F | Ht 63.0 in | Wt 190.0 lb

## 2023-04-16 DIAGNOSIS — E785 Hyperlipidemia, unspecified: Secondary | ICD-10-CM | POA: Diagnosis not present

## 2023-04-16 DIAGNOSIS — E782 Mixed hyperlipidemia: Secondary | ICD-10-CM

## 2023-04-16 DIAGNOSIS — Z6833 Body mass index (BMI) 33.0-33.9, adult: Secondary | ICD-10-CM | POA: Diagnosis not present

## 2023-04-16 DIAGNOSIS — E66811 Obesity, class 1: Secondary | ICD-10-CM | POA: Diagnosis not present

## 2023-04-16 DIAGNOSIS — R059 Cough, unspecified: Secondary | ICD-10-CM | POA: Diagnosis not present

## 2023-04-16 DIAGNOSIS — I10 Essential (primary) hypertension: Secondary | ICD-10-CM | POA: Diagnosis not present

## 2023-04-16 DIAGNOSIS — E1169 Type 2 diabetes mellitus with other specified complication: Secondary | ICD-10-CM

## 2023-04-16 DIAGNOSIS — E6609 Other obesity due to excess calories: Secondary | ICD-10-CM

## 2023-04-16 MED ORDER — BENZONATATE 100 MG PO CAPS: 100.0000 mg | ORAL_CAPSULE | Freq: Three times a day (TID) | ORAL | 0 refills | Status: DC | PRN

## 2023-04-16 NOTE — Progress Notes (Addendum)
 I,Jameka J Llittleton, CMA,acting as a Neurosurgeon for [employer], NP.,have documented all relevant documentation on the behalf of Ellender Hose, NP,as directed by  Ellender Hose, NP while in the presence of Ellender Hose, NP.  Subjective:  Patient ID: Jasmine Lopez , female    DOB: May 05, 1941 , 82 y.o.   MRN: [id number]  Chief Complaint  Patient presents with   URI   Diabetes   Hypertension    HPI  Patient presents today for hypertension and Diabetes management  follow up, Patient reports compliance with medications. Patient denies any chest pain, SOB,  headache,fever. Patient reports she is hoarseness and has a lot of postnasal drip. She reports she has been coughing . She reports she took some mucinex and that has helped her cough. Will write some Tessalon Perles to use as needed.      Past Medical History:  Diagnosis Date   Anxiety    Breast cancer (HCC)    Cancer (HCC)    Diabetes mellitus without complication (HCC)    Dysphagia    GERD (gastroesophageal reflux disease) 10/12/2020   Hair loss 10/12/2020   Heart murmur    Hx of adenomatous polyp of colon 06/2015   Dr. Hyman [facility]   Hypertension    Irregular heart beat    Sleep apnea    SVT (supraventricular tachycardia) (HCC) 08/11/2022     Family History  Problem Relation Age of Onset   Heart failure Mother        CAD   COPD Mother    Hypertension Mother    Cancer Father    Rectal cancer Maternal Grandmother        Unsure   Colon cancer Maternal Grandmother    Heart attack Maternal Grandfather    Esophageal cancer Neg Hx    Stomach cancer Neg Hx    Breast cancer Neg Hx      Current Outpatient Medications:    allopurinol (ZYLOPRIM) 100 MG tablet, TAKE 1 TABLET(100 MG) BY MOUTH DAILY, Disp: 90 tablet, Rfl: 1   amLODipine (NORVASC) 10 MG tablet, Take 1 tablet (10 mg total) by mouth daily., Disp: 90 tablet, Rfl: 3   benzonatate (TESSALON PERLES) 100 MG capsule, Take 1 capsule (100 mg total) by mouth 3  (three) times daily as needed for cough., Disp: 30 capsule, Rfl: 0   Biotin 1000 MCG tablet, Take 1,000 mcg by mouth daily., Disp: , Rfl:    carvedilol (COREG) 6.25 MG tablet, Take 1 tablet (6.25 mg total) by mouth 2 (two) times daily with a meal., Disp: 180 tablet, Rfl: 3   cetirizine (ZYRTEC) 10 MG tablet, Take 10 mg by mouth daily., Disp: , Rfl:    Cholecalciferol (VITAMIN D3) 125 MCG (5000 UT) CAPS, Take 1 capsule by mouth daily., Disp: , Rfl:    Garlic 1000 MG CAPS, Take 1 capsule by mouth daily., Disp: , Rfl:    metFORMIN (GLUCOPHAGE-XR) 500 MG 24 hr tablet, Take 1 tablet (500 mg total) by mouth daily., Disp: 90 tablet, Rfl: 1   Multiple Vitamin (MULTIVITAMIN) capsule, Take 1 capsule by mouth daily., Disp: , Rfl:    rosuvastatin (CRESTOR) 20 MG tablet, TAKE 1 TABLET(20 MG) BY MOUTH DAILY, Disp: 90 tablet, Rfl: 1   Semaglutide,0.25 or 0.5MG /DOS, (OZEMPIC, 0.25 OR 0.5 MG/DOSE,) 2 MG/1.5ML SOPN, Inject 0.5 mg into the skin once a week., Disp: 4.5 mL, Rfl: 1   valsartan-hydrochlorothiazide (DIOVAN HCT) 160-12.5 MG tablet, Take 1 tablet by mouth daily., Disp: 90  tablet, Rfl: 1  Current Facility-Administered Medications:    0.9 %  sodium chloride infusion, 500 mL, Intravenous, Once, Armbruster, Willaim Rayas, MD   Allergies  Allergen Reactions   Ace Inhibitors Cough   Atorvastatin Other (See Comments)    Leg pain    Benadryl [Diphenhydramine] Itching    Benadryl cream   Peppermint Flavor [Flavoring Agent]     Causes horsenss     Review of Systems  Constitutional: Negative.   HENT:  Positive for postnasal drip.   Eyes: Negative.   Respiratory:  Positive for cough. Negative for shortness of breath.   Cardiovascular: Negative.   Gastrointestinal: Negative.   Musculoskeletal: Negative.   Skin: Negative.   Psychiatric/Behavioral: Negative.       Today's Vitals   04/16/23 1054 04/16/23 1152  BP: (!) 140/94 130/78  Pulse: 70   Temp: 98.2 F (36.8 C)   Weight: 190 lb (86.2 kg)    Height: 5\' 3"  (1.6 m)   PainSc: 0-No pain    Body mass index is 33.66 kg/m.  Wt Readings from Last 3 Encounters:  04/16/23 190 lb (86.2 kg)  03/12/23 191 lb 6.4 oz (86.8 kg)  03/11/23 191 lb 6.4 oz (86.8 kg)    The ASCVD Risk score (Arnett DK, et al., 2019) failed to calculate for the following reasons:   The 2019 ASCVD risk score is only valid for ages 38 to 65  Objective:  Physical Exam HENT:     Nose: Nose normal.  Cardiovascular:     Rate and Rhythm: Normal rate.  Pulmonary:     Breath sounds: Normal breath sounds.  Musculoskeletal:        General: Normal range of motion.  Skin:    General: Skin is warm and dry.  Neurological:     Mental Status: She is alert.  Psychiatric:        Mood and Affect: Mood normal.        Behavior: Behavior normal.         Assessment And Plan:  Type 2 diabetes mellitus with hyperlipidemia (HCC) -     Hemoglobin A1c  Essential hypertension -     CBC -     CMP14+EGFR  Mixed hyperlipidemia -     Lipid panel  Cough in adult -     Benzonatate; Take 1 capsule (100 mg total) by mouth 3 (three) times daily as needed for cough.  Dispense: 30 capsule; Refill: 0  Class 1 obesity due to excess calories with serious comorbidity and body mass index (BMI) of 33.0 to 33.9 in adult    Return in 4 years (on 04/16/2027), or if symptoms worsen or fail to improve, for diabetes.  Patient was given opportunity to ask questions. Patient verbalized understanding of the plan and was able to repeat key elements of the plan. All questions were answered to their satisfaction.    I, Ellender Hose, NP, have reviewed all documentation for this visit. The documentation on 04/22/23 for the exam, diagnosis, procedures, and orders are all accurate and complete.   IF YOU HAVE BEEN REFERRED TO A SPECIALIST, IT MAY TAKE 1-2 WEEKS TO SCHEDULE/PROCESS THE REFERRAL. IF YOU HAVE NOT HEARD FROM US/SPECIALIST IN TWO WEEKS, PLEASE GIVE [place] A CALL AT [phone].

## 2023-04-17 LAB — CMP14+EGFR
ALT: 17 [IU]/L (ref 0–32)
AST: 18 [IU]/L (ref 0–40)
Albumin: 3.8 g/dL (ref 3.8–4.8)
Alkaline Phosphatase: 71 [IU]/L (ref 44–121)
BUN/Creatinine Ratio: 15 (ref 12–28)
BUN: 11 mg/dL (ref 8–27)
Bilirubin Total: 0.3 mg/dL (ref 0.0–1.2)
CO2: 26 mmol/L (ref 20–29)
Calcium: 9.1 mg/dL (ref 8.7–10.3)
Chloride: 100 mmol/L (ref 96–106)
Creatinine, Ser: 0.73 mg/dL (ref 0.57–1.00)
Globulin, Total: 2.2 g/dL (ref 1.5–4.5)
Glucose: 151 mg/dL — ABNORMAL HIGH (ref 70–99)
Potassium: 4.4 mmol/L (ref 3.5–5.2)
Sodium: 137 mmol/L (ref 134–144)
Total Protein: 6 g/dL (ref 6.0–8.5)
eGFR: 83 mL/min/{1.73_m2} (ref >59)

## 2023-04-17 LAB — LIPID PANEL
Chol/HDL Ratio: 2.6 {ratio} (ref 0.0–4.4)
Cholesterol, Total: 131 mg/dL (ref 100–199)
HDL: 50 mg/dL (ref >39)
LDL Chol Calc (NIH): 69 mg/dL (ref 0–99)
Triglycerides: 54 mg/dL (ref 0–149)
VLDL Cholesterol Cal: 12 mg/dL (ref 5–40)

## 2023-04-17 LAB — CBC
Hematocrit: 37.3 % (ref 34.0–46.6)
Hemoglobin: 12.4 g/dL (ref 11.1–15.9)
MCH: 29.5 pg (ref 26.6–33.0)
MCHC: 33.2 g/dL (ref 31.5–35.7)
MCV: 89 fL (ref 79–97)
Platelets: 265 10*3/uL (ref 150–450)
RBC: 4.2 x10E6/uL (ref 3.77–5.28)
RDW: 13.1 % (ref 11.7–15.4)
WBC: 7.4 10*3/uL (ref 3.4–10.8)

## 2023-04-17 LAB — HEMOGLOBIN A1C
Est. average glucose Bld gHb Est-mCnc: 169 mg/dL
Hgb A1c MFr Bld: 7.5 % — ABNORMAL HIGH (ref 4.8–5.6)

## 2023-04-22 DIAGNOSIS — E66811 Obesity, class 1: Secondary | ICD-10-CM | POA: Insufficient documentation

## 2023-04-22 DIAGNOSIS — E6609 Other obesity due to excess calories: Secondary | ICD-10-CM | POA: Insufficient documentation

## 2023-04-22 DIAGNOSIS — R059 Cough, unspecified: Secondary | ICD-10-CM | POA: Insufficient documentation

## 2023-04-22 HISTORY — DX: Other obesity due to excess calories: E66.09

## 2023-04-22 HISTORY — DX: Obesity, class 1: E66.811

## 2023-04-22 NOTE — Progress Notes (Signed)
A1c 7.5 , no changes from last time. 7 or less preferred.All other labs are normal

## 2023-05-11 ENCOUNTER — Ambulatory Visit: Payer: Medicare PPO | Admitting: Nurse Practitioner

## 2023-06-01 ENCOUNTER — Other Ambulatory Visit: Payer: Self-pay | Admitting: Pharmacist

## 2023-06-01 DIAGNOSIS — E1169 Type 2 diabetes mellitus with other specified complication: Secondary | ICD-10-CM

## 2023-06-01 DIAGNOSIS — I10 Essential (primary) hypertension: Secondary | ICD-10-CM

## 2023-06-01 MED ORDER — BLOOD GLUCOSE TEST VI STRP: ORAL_STRIP | 3 refills | Status: AC

## 2023-06-01 MED ORDER — BLOOD GLUCOSE MONITORING SUPPL DEVI
0 refills | Status: AC
Start: 2023-06-01 — End: ?

## 2023-06-01 MED ORDER — LANCETS MISC. MISC: 3 refills | Status: DC

## 2023-06-01 MED ORDER — LANCET DEVICE MISC: 0 refills | Status: AC

## 2023-06-01 NOTE — Progress Notes (Signed)
06/01/2023 Name: Jasmine Lopez MRN: [id number] DOB: 24-Feb-1941  Chief Complaint  Patient presents with   Diabetes    Jasmine Lopez is a 83 y.o. year old female who presented for a telephone visit.   They were referred to the pharmacist by their PCP for assistance in managing diabetes.    Subjective:  Care Team: Primary Care Provider: [name], FNP ; Next Scheduled Visit: 07/15/2023  Medication Access/Adherence  Current Pharmacy:  [id number] - [place], [place] - [address] AT [facility] [address]  [place] [address] Phone: [phone] Fax: [phone]   Patient reports affordability concerns with their medications: No  Patient reports access/transportation concerns to their pharmacy: No  Patient reports adherence concerns with their medications:  No    Takes all medications together in the morning, besides evening dose of carvedilol  Diabetes:  Current medications: metformin XR 500 mg daily Prior medications: fecal urgency with higher doses of metformin; did well with Ozempic but cost is prohibitive - copay is ~ $400.  Current glucose readings: reports she would like a new glucometer, she is missing parts to hers.   Patient denies hypoglycemic s/sx including dizziness, shakiness, sweating. Patient denies hyperglycemic symptoms including polyuria, polydipsia, polyphagia, nocturia, neuropathy, blurred vision.  Current physical activity: reports working on going to exercise classes more frequently   Current medication access support: over income for GLP1 assistance   Hypertension:  Current medications: carvedilol 6.25 mg twice daily, amlodipine 10 mg daily, valsartan/hydrochlorothiazide 160/12.5 mg daily   Patient does not have a validated, automated, upper arm home BP cuff; but notes she is able to purchase one.  Patient denies hypotensive s/sx including dizziness, lightheadedness.  Patient denies hypertensive symptoms  including headache, chest pain, shortness of breath   Hyperlipidemia/ASCVD Risk Reduction  Current lipid lowering medications: rosuvastatin 20 mg daily   Objective:  Lab Results  Component Value Date   HGBA1C 7.5 (H) 04/16/2023    Lab Results  Component Value Date   CREATININE 0.73 04/16/2023   BUN 11 04/16/2023   NA 137 04/16/2023   K 4.4 04/16/2023   CL 100 04/16/2023   CO2 26 04/16/2023    Lab Results  Component Value Date   CHOL 131 04/16/2023   HDL 50 04/16/2023   LDLCALC 69 04/16/2023   TRIG 54 04/16/2023   CHOLHDL 2.6 04/16/2023    Medications Reviewed Today     Reviewed by Alden Hipp, RPH-CPP (Pharmacist) on 06/01/23 at 1405  Med List Status: <None>   Medication Order Taking? Sig Documenting Provider Last Dose Status Informant  0.9 %  sodium chloride infusion [id number]   Armbruster, Willaim Rayas, MD  Active   allopurinol (ZYLOPRIM) 100 MG tablet [id number] Yes TAKE 1 TABLET(100 MG) BY MOUTH DAILY [name], FNP Taking Active   amLODipine (NORVASC) 10 MG tablet [id number] Yes Take 1 tablet (10 mg total) by mouth daily. Chilton Si, MD Taking Active   benzonatate (TESSALON PERLES) 100 MG capsule [id number]  Take 1 capsule (100 mg total) by mouth 3 (three) times daily as needed for cough. Ellender Hose, NP  Active   Biotin 1000 MCG tablet [id number] Yes Take 1,000 mcg by mouth daily. [provider] Taking Active   carvedilol (COREG) 6.25 MG tablet [id number] Yes Take 1 tablet (6.25 mg total) by mouth 2 (two) times daily with a meal. Chilton Si, MD Taking Active            Med Note ([address] Tiago Humphrey T  Mon Jun 01, 2023  2:02 PM)    cetirizine (ZYRTEC) 10 MG tablet [id number]  Take 10 mg by mouth daily. [provider]  Active   Cholecalciferol (VITAMIN D3) 125 MCG (5000 UT) CAPS [id number] Yes Take 1 capsule by mouth daily. [provider] Taking Active   COD LIVER OIL PO [id number] Yes Take 1 capsule by mouth daily.  [provider] Taking Active   Garlic 1000 MG CAPS [id number] Yes Take 1 capsule by mouth daily. [provider] Taking Active   metFORMIN (GLUCOPHAGE-XR) 500 MG 24 hr tablet [id number] Yes Take 1 tablet (500 mg total) by mouth daily. [name], FNP Taking Active   Misc Natural Products (TURMERIC CURCUMIN) CAPS [id number] Yes Take 1 capsule by mouth daily. [provider] Taking Active   Multiple Vitamin (MULTIVITAMIN) capsule [id number]  Take 1 capsule by mouth daily. [provider]  Active   rosuvastatin (CRESTOR) 20 MG tablet [id number] Yes TAKE 1 TABLET(20 MG) BY MOUTH DAILY [name], FNP Taking Active   Semaglutide,0.25 or 0.5MG /DOS, (OZEMPIC, 0.25 OR 0.5 MG/DOSE,) 2 MG/1.5ML SOPN [id number]  Inject 0.5 mg into the skin once a week. [name], FNP  Active            Med Note Clearance Coots, Danie Diehl T   Mon Jun 01, 2023  2:03 PM)    valsartan-hydrochlorothiazide (DIOVAN HCT) 160-12.5 MG tablet [id number] Yes Take 1 tablet by mouth daily. [name], FNP Taking Active               Assessment/Plan:   Diabetes: - Currently controlled at more relaxed goal of <7.5% likely appropriate given age, prior medication intolerances  - Recommend to check glucose periodically, New script for glucometer sent   Hypertension: - Currently controlled - Reviewed long term cardiovascular and renal outcomes of uncontrolled blood pressure - Reviewed appropriate blood pressure monitoring technique and reviewed goal blood pressure. Recommended to check home blood pressure and heart rate periodically, document, and provide at future appointments.  - Recommend to continue current regimen at this time. Patient agrees to purchase    Hyperlipidemia/ASCVD Risk Reduction: - Currently controlled.  - Recommend to continue current regimen at this time    Follow Up Plan: follow up with PCP as scheduled  Catie Eppie Gibson, PharmD, BCACP, CPP Clinical Pharmacist [facility] [phone]

## 2023-06-09 ENCOUNTER — Other Ambulatory Visit: Payer: Self-pay | Admitting: Nurse Practitioner

## 2023-06-10 ENCOUNTER — Ambulatory Visit: Payer: Medicare PPO | Admitting: Family Medicine

## 2023-06-10 ENCOUNTER — Encounter: Payer: Self-pay | Admitting: Family Medicine

## 2023-06-10 VITALS — BP 120/70 | HR 68 | Temp 98.2°F | Ht 63.0 in | Wt 194.0 lb

## 2023-06-10 DIAGNOSIS — F330 Major depressive disorder, recurrent, mild: Secondary | ICD-10-CM

## 2023-06-10 DIAGNOSIS — Z6834 Body mass index (BMI) 34.0-34.9, adult: Secondary | ICD-10-CM | POA: Diagnosis not present

## 2023-06-10 DIAGNOSIS — E1169 Type 2 diabetes mellitus with other specified complication: Secondary | ICD-10-CM

## 2023-06-10 DIAGNOSIS — E66811 Obesity, class 1: Secondary | ICD-10-CM | POA: Diagnosis not present

## 2023-06-10 DIAGNOSIS — E785 Hyperlipidemia, unspecified: Secondary | ICD-10-CM | POA: Diagnosis not present

## 2023-06-10 DIAGNOSIS — E6609 Other obesity due to excess calories: Secondary | ICD-10-CM

## 2023-06-10 MED ORDER — BUPROPION HCL ER (XL) 150 MG PO TB24: 150.0000 mg | ORAL_TABLET | Freq: Every day | ORAL | 5 refills | Status: DC

## 2023-06-10 MED ORDER — TRULICITY 0.75 MG/0.5ML ~~LOC~~ SOAJ: 0.7500 mg | SUBCUTANEOUS | 1 refills | Status: DC

## 2023-06-10 NOTE — Patient Instructions (Signed)
Depression Screening Depression screening is a tool that your health care provider can use to learn if you have symptoms of depression. Depression is a common condition with many symptoms that are also often found in other conditions. Depression is treatable, but it must first be diagnosed. You may not know that certain feelings, thoughts, and behaviors that you are having can be symptoms of depression. Taking a depression screening test can help you and your health care provider decide if you need more assessment, or if you should be referred to a mental health care provider. What are the screening tests? You may have a physical exam to see if another condition is affecting your mental health. You may have a blood or urine sample taken during the physical exam. You may be interviewed or offered a written test using a screening tool that was developed from research, such as one of these: Patient Health Questionnaire (PHQ). This is a set of either 2 or 9 questions. A health care provider who has been trained to score this screening test uses a guide to assess if your symptoms suggest that you may have depression. Hamilton Depression Rating Scale (HAM-D). This is a set of either 17 or 24 questions. You may be asked to take it again during or after your treatment, to see if your depression has gotten better. Beck Depression Inventory (BDI). This is a set of 21 multiple choice questions. Your health care provider scores your answers to assess: Your level of depression, ranging from mild to severe. Your response to treatment. Your health care provider may talk with you about your daily activities, such as eating, sleeping, work, and recreation, and ask if you have had any changes in activity. Your health care provider may ask you to see a mental health specialist, such as a psychiatrist or psychologist, for more evaluation. Who should be screened for depression?  All adults, including adults with a family  history of a mental health disorder. People who are 46-33 years old. People who are recovering from an acute condition, such as myocardial infarction (MI) or stroke. Pregnant women, or women who have given birth. People who have a long-term (chronic) illness. Anyone who has been diagnosed with another type of mental health disorder. Anyone who has symptoms that could show depression. What do my results mean? Your health care provider will review the results of your depression screening, physical exam, and lab tests. Positive screens suggest that you may have depression. Screening is the first step in getting the care that you may need. It will be important for you to know the results of your tests. Ask your health care provider, or the department that is doing your screening tests, when your results will be ready. Talk with your health care provider about your results, diagnosis, and recommendations for follow-up. A diagnosis of depression is made using information from the Diagnostic and Statistical Manual of Mental Disorders (DSM-5). This is a book that lists the number and type of symptoms that must be present for a health care provider to give a specific diagnosis. Your health care provider may work with you to treat your symptoms of depression, or your health care provider may help you find a mental health provider who can assess and help you develop a plan to treat your depression. Get help right away if: You have thoughts about hurting yourself or others. If you ever feel like you may hurt yourself or others, or have thoughts about taking your own life,  get help right away. Go to your nearest emergency department or: Call your local emergency services (911 in the [place] Call a suicide crisis helpline, such as the National Suicide Prevention Lifeline at 6577826223 or 988 in the [place] This is open 24 hours a day in the [place] If you're a Veteran: Call 988 and press 1. This is open 24 hours a  day. Text the [employer] at 865-878-8368. Summary Depression screening is the first step in getting the help that you may need. If your screening test shows symptoms of depression (is positive), your health care provider may ask you to see a mental health provider who will help identify ways to treat your depression. Anyone aged 34 or older should be screened for depression. This information is not intended to replace advice given to you by your health care provider. Make sure you discuss any questions you have with your health care provider. Document Revised: 03/31/2023 Document Reviewed: 08/06/2020 Elsevier Patient Education  2024 [employer].

## 2023-06-10 NOTE — Progress Notes (Addendum)
 I,Jameka J Llittleton, CMA,acting as a Neurosurgeon for [employer], NP.,have documented all relevant documentation on the behalf of Ellender Hose, NP,as directed by  Ellender Hose, NP while in the presence of Ellender Hose, NP.  Subjective:  Patient ID: Jasmine Lopez , female    DOB: [birth year] , 83 y.o.   MRN: [id number]  Chief Complaint  Patient presents with   Depression    HPI  Patient is a 83 year old female who presents today because of symptoms of depression. She states that she has had medicine for depression in the past and she felt that she had gotten over those feelings so she stopped taking her medication about 2 years ago but reports that in the past few weeks she has been feeling really depressed and even her children has even noticed too.. Patient states she used to be on Wellbutrin 150mg  every day in the past , she will like to get back on it and she will like to start counseling too.  Depression        This is a recurrent problem.  The current episode started more than 1 month ago.   The onset quality is gradual.   The problem occurs every several days.  Associated symptoms include decreased concentration, fatigue, hopelessness, insomnia, irritable, restlessness, decreased interest and sad.  Associated symptoms include no appetite change and no suicidal ideas.     The symptoms are aggravated by nothing.  Past treatments include SNRIs - Serotonin and norepinephrine reuptake inhibitors.  Past medical history includes chronic illness.      Past Medical History:  Diagnosis Date   Anxiety    Breast cancer (HCC)    Cancer (HCC)    Diabetes mellitus without complication (HCC)    Dysphagia    GERD (gastroesophageal reflux disease) 10/12/2020   Hair loss 10/12/2020   Heart murmur    Hx of adenomatous polyp of colon 06/2015   Dr. Hyman [facility]   Hypertension    Irregular heart beat    Sleep apnea    SVT (supraventricular tachycardia) (HCC) 08/11/2022     Family History   Problem Relation Age of Onset   Heart failure Mother        CAD   COPD Mother    Hypertension Mother    Cancer Father    Rectal cancer Maternal Grandmother        Unsure   Colon cancer Maternal Grandmother    Heart attack Maternal Grandfather    Esophageal cancer Neg Hx    Stomach cancer Neg Hx    Breast cancer Neg Hx      Current Outpatient Medications:    allopurinol (ZYLOPRIM) 100 MG tablet, TAKE 1 TABLET(100 MG) BY MOUTH DAILY, Disp: 90 tablet, Rfl: 1   amLODipine (NORVASC) 10 MG tablet, Take 1 tablet (10 mg total) by mouth daily., Disp: 90 tablet, Rfl: 3   benzonatate (TESSALON PERLES) 100 MG capsule, Take 1 capsule (100 mg total) by mouth 3 (three) times daily as needed for cough., Disp: 30 capsule, Rfl: 0   Biotin 1000 MCG tablet, Take 1,000 mcg by mouth daily., Disp: , Rfl:    Blood Glucose Monitoring Suppl DEVI, Use to check glucose twice daily. May substitute to any manufacturer covered by patient's insurance., Disp: 1 each, Rfl: 0   buPROPion (WELLBUTRIN XL) 150 MG 24 hr tablet, Take 1 tablet (150 mg total) by mouth daily., Disp: 30 tablet, Rfl: 5   carvedilol (COREG) 6.25 MG tablet,  Take 1 tablet (6.25 mg total) by mouth 2 (two) times daily with a meal., Disp: 180 tablet, Rfl: 3   cetirizine (ZYRTEC) 10 MG tablet, Take 10 mg by mouth daily., Disp: , Rfl:    Cholecalciferol (VITAMIN D3) 125 MCG (5000 UT) CAPS, Take 1 capsule by mouth daily., Disp: , Rfl:    COD LIVER OIL PO, Take 1 capsule by mouth daily., Disp: , Rfl:    Dulaglutide (TRULICITY) 0.75 MG/0.5ML SOAJ, Inject 0.75 mg into the skin once a week., Disp: 2 mL, Rfl: 1   Garlic 1000 MG CAPS, Take 1 capsule by mouth daily., Disp: , Rfl:    Glucose Blood (BLOOD GLUCOSE TEST STRIPS) STRP, Use to check glucose twice daily. May substitute to any manufacturer covered by patient's insurance., Disp: 100 strip, Rfl: 3   Lancet Device MISC, Use to check glucose twice daily. May substitute to any manufacturer covered by  patient's insurance., Disp: 1 each, Rfl: 0   Lancets Misc. (ACCU-CHEK FASTCLIX LANCET) KIT, USE TO CHECK GLUCOSE TWICE DAILY, Disp: 1 kit, Rfl: 1   Lancets Misc. MISC, May substitute to any manufacturer covered by patient's insurance., Disp: 100 each, Rfl: 3   metFORMIN (GLUCOPHAGE-XR) 500 MG 24 hr tablet, Take 1 tablet (500 mg total) by mouth daily., Disp: 90 tablet, Rfl: 1   Misc Natural Products (TURMERIC CURCUMIN) CAPS, Take 1 capsule by mouth daily., Disp: , Rfl:    Multiple Vitamin (MULTIVITAMIN) capsule, Take 1 capsule by mouth daily., Disp: , Rfl:    rosuvastatin (CRESTOR) 20 MG tablet, TAKE 1 TABLET(20 MG) BY MOUTH DAILY, Disp: 90 tablet, Rfl: 1   valsartan-hydrochlorothiazide (DIOVAN HCT) 160-12.5 MG tablet, Take 1 tablet by mouth daily., Disp: 90 tablet, Rfl: 1   predniSONE (DELTASONE) 20 MG tablet, Take 2 tabs daily x 3 days, Disp: 6 tablet, Rfl: 0  Current Facility-Administered Medications:    0.9 %  sodium chloride infusion, 500 mL, Intravenous, Once, Armbruster, Willaim Rayas, MD   Allergies  Allergen Reactions   Ace Inhibitors Cough   Atorvastatin Other (See Comments)    Leg pain    Benadryl [Diphenhydramine] Itching    Benadryl cream   Peppermint Flavor [Flavoring Agent (Non-Screening)]     Causes horsenss     Review of Systems  Constitutional:  Positive for fatigue. Negative for appetite change.  HENT: Negative.    Eyes: Negative.   Respiratory: Negative.    Cardiovascular: Negative.   Endocrine: Negative for polydipsia, polyphagia and polyuria.  Psychiatric/Behavioral:  Positive for decreased concentration, depression and dysphoric mood. Negative for suicidal ideas. The patient has insomnia.      Today's Vitals   06/10/23 0952  BP: 120/70  Pulse: 68  Temp: 98.2 F (36.8 C)  TempSrc: Oral  Weight: 194 lb (88 kg)  Height: 5\' 3"  (1.6 m)  PainSc: 0-No pain   Body mass index is 34.37 kg/m.  Wt Readings from Last 3 Encounters:  07/15/23 192 lb 6.4 oz (87.3 kg)   06/10/23 194 lb (88 kg)  04/16/23 190 lb (86.2 kg)    The ASCVD Risk score (Arnett DK, et al., 2019) failed to calculate for the following reasons:   The 2019 ASCVD risk score is only valid for ages 73 to 43  Objective:  Physical Exam Constitutional:      General: She is irritable.  HENT:     Head: Normocephalic.  Cardiovascular:     Rate and Rhythm: Normal rate.  Pulmonary:     Effort: Pulmonary  effort is normal.     Breath sounds: Normal breath sounds.  Skin:    General: Skin is warm and dry.  Neurological:     General: No focal deficit present.     Mental Status: She is alert and oriented to person, place, and time.  Psychiatric:        Behavior: Behavior normal.         Assessment And Plan:  Mild episode of recurrent major depressive disorder (HCC) Assessment & Plan: Advised to restart wellbutrin 150 mg XL every day. Counseled to watch out for loss of appetite or insomnia  Orders: -     buPROPion HCl ER (XL); Take 1 tablet (150 mg total) by mouth daily.  Dispense: 30 tablet; Refill: 5 -     Ambulatory referral to Psychology  Type 2 diabetes mellitus with hyperlipidemia (HCC) Assessment & Plan: A1c 7.5; Low carb diet advised. start Trulicity 0.75 mg Q weekly.  Orders: -     Trulicity; Inject 0.75 mg into the skin once a week.  Dispense: 2 mL; Refill: 1  Class 1 obesity due to excess calories with body mass index (BMI) of 34.0 to 34.9 in adult, unspecified whether serious comorbidity present Assessment & Plan: She is encouraged to strive for BMI less than 30 to decrease cardiac risk. Advised to aim for at least 150 minutes of exercise per week.      Return in about 2 months (around 08/08/2023) for depression management.  Patient was given opportunity to ask questions. Patient verbalized understanding of the plan and was able to repeat key elements of the plan. All questions were answered to their satisfaction.    I, Ellender Hose, NP, have reviewed all  documentation for this visit. The documentation on 06/14/2023 for the exam, diagnosis, procedures, and orders are all accurate and complete.    IF YOU HAVE BEEN REFERRED TO A SPECIALIST, IT MAY TAKE 1-2 WEEKS TO SCHEDULE/PROCESS THE REFERRAL. IF YOU HAVE NOT HEARD FROM US/SPECIALIST IN TWO WEEKS, PLEASE GIVE [place] A CALL AT [phone].

## 2023-06-10 NOTE — Progress Notes (Signed)
Erroneous encounter

## 2023-06-14 DIAGNOSIS — E66811 Obesity, class 1: Secondary | ICD-10-CM | POA: Insufficient documentation

## 2023-06-14 DIAGNOSIS — E6609 Other obesity due to excess calories: Secondary | ICD-10-CM | POA: Insufficient documentation

## 2023-06-14 HISTORY — DX: Other obesity due to excess calories: E66.09

## 2023-06-14 NOTE — Assessment & Plan Note (Signed)
Advised to restart wellbutrin 150 mg XL every day. Counseled to watch out for loss of appetite or insomnia

## 2023-06-14 NOTE — Assessment & Plan Note (Addendum)
A1c 7.5; Low carb diet advised. start Trulicity 0.75 mg Q weekly.

## 2023-06-14 NOTE — Assessment & Plan Note (Signed)
 She is encouraged to strive for BMI less than 30 to decrease cardiac risk. Advised to aim for at least 150 minutes of exercise per week.

## 2023-06-29 ENCOUNTER — Encounter: Payer: Self-pay | Admitting: Podiatry

## 2023-06-29 ENCOUNTER — Ambulatory Visit: Payer: Medicare PPO | Admitting: Podiatry

## 2023-06-29 DIAGNOSIS — M779 Enthesopathy, unspecified: Secondary | ICD-10-CM | POA: Diagnosis not present
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
## 2023-07-01 ENCOUNTER — Encounter: Payer: Self-pay | Admitting: Nurse Practitioner

## 2023-07-01 ENCOUNTER — Telehealth: Payer: Medicare PPO | Admitting: Nurse Practitioner

## 2023-07-01 DIAGNOSIS — R051 Acute cough: Secondary | ICD-10-CM | POA: Diagnosis not present

## 2023-07-01 MED ORDER — PREDNISONE 20 MG PO TABS: ORAL_TABLET | ORAL | 0 refills | Status: DC

## 2023-07-01 NOTE — Progress Notes (Signed)
Virtual Visit via Video Note  Madelaine Bhat, CMA,acting as a scribe for [name], FNP.,have documented all relevant documentation on the behalf of [name], FNP,as directed by  [name], FNP while in the presence of [name], FNP.  I connected with Jasmine Lopez on 07/01/23 at 11:20 AM EST by a video enabled telemedicine application and verified that I am speaking with the correct person using two identifiers.  Patient Location: Home  Provider Location: Office/Clinic I discussed the limitations, risks, security, and privacy concerns of performing an evaluation and management service by video and the availability of in person appointments. I also discussed with the patient that there may be a patient responsible charge related to this service. The patient expressed understanding and agreed to proceed.  Subjective: PCP: [name], FNP  Chief Complaint  Patient presents with   Cough   Patient reports she has a had a cough since Monday. Her symptoms initially started on Friday or Saturday. She had taken Mucinex initially. Patient reports she has tried Delsym since Monday night and it has not helped. She has also taken the tessalon perles without relief. She does have an appetite. Patient reports she took a covid test yesterday and it was negative. Patient reports she has some pain in ribs from coughing so much -feels like a pulled muscle.     Review of Systems  Constitutional: Negative.   Respiratory:  Positive for cough. Negative for sputum production, shortness of breath and wheezing.        She is having muscle soreness  Neurological: Negative.   Psychiatric/Behavioral: Negative.       Current Outpatient Medications:    allopurinol (ZYLOPRIM) 100 MG tablet, TAKE 1 TABLET(100 MG) BY MOUTH DAILY, Disp: 90 tablet, Rfl: 1   amLODipine (NORVASC) 10 MG tablet, Take 1 tablet (10 mg total) by mouth daily., Disp: 90 tablet, Rfl: 3   benzonatate (TESSALON PERLES)  100 MG capsule, Take 1 capsule (100 mg total) by mouth 3 (three) times daily as needed for cough., Disp: 30 capsule, Rfl: 0   Biotin 1000 MCG tablet, Take 1,000 mcg by mouth daily., Disp: , Rfl:    Blood Glucose Monitoring Suppl DEVI, Use to check glucose twice daily. May substitute to any manufacturer covered by patient's insurance., Disp: 1 each, Rfl: 0   buPROPion (WELLBUTRIN XL) 150 MG 24 hr tablet, Take 1 tablet (150 mg total) by mouth daily., Disp: 30 tablet, Rfl: 5   carvedilol (COREG) 6.25 MG tablet, Take 1 tablet (6.25 mg total) by mouth 2 (two) times daily with a meal., Disp: 180 tablet, Rfl: 3   cetirizine (ZYRTEC) 10 MG tablet, Take 10 mg by mouth daily., Disp: , Rfl:    Cholecalciferol (VITAMIN D3) 125 MCG (5000 UT) CAPS, Take 1 capsule by mouth daily., Disp: , Rfl:    COD LIVER OIL PO, Take 1 capsule by mouth daily., Disp: , Rfl:    Dulaglutide (TRULICITY) 0.75 MG/0.5ML SOAJ, Inject 0.75 mg into the skin once a week., Disp: 2 mL, Rfl: 1   Garlic 1000 MG CAPS, Take 1 capsule by mouth daily., Disp: , Rfl:    Glucose Blood (BLOOD GLUCOSE TEST STRIPS) STRP, Use to check glucose twice daily. May substitute to any manufacturer covered by patient's insurance., Disp: 100 strip, Rfl: 3   Lancet Device MISC, Use to check glucose twice daily. May substitute to any manufacturer covered by patient's insurance., Disp: 1 each, Rfl: 0   Lancets Misc. (ACCU-CHEK  FASTCLIX LANCET) KIT, USE TO CHECK GLUCOSE TWICE DAILY, Disp: 1 kit, Rfl: 1   Lancets Misc. MISC, May substitute to any manufacturer covered by patient's insurance., Disp: 100 each, Rfl: 3   metFORMIN (GLUCOPHAGE-XR) 500 MG 24 hr tablet, Take 1 tablet (500 mg total) by mouth daily., Disp: 90 tablet, Rfl: 1   Misc Natural Products (TURMERIC CURCUMIN) CAPS, Take 1 capsule by mouth daily., Disp: , Rfl:    Multiple Vitamin (MULTIVITAMIN) capsule, Take 1 capsule by mouth daily., Disp: , Rfl:    predniSONE (DELTASONE) 20 MG tablet, Take 2 tabs daily  x 3 days, Disp: 6 tablet, Rfl: 0   rosuvastatin (CRESTOR) 20 MG tablet, TAKE 1 TABLET(20 MG) BY MOUTH DAILY, Disp: 90 tablet, Rfl: 1   valsartan-hydrochlorothiazide (DIOVAN HCT) 160-12.5 MG tablet, Take 1 tablet by mouth daily., Disp: 90 tablet, Rfl: 1  Current Facility-Administered Medications:    0.9 %  sodium chloride infusion, 500 mL, Intravenous, Once, Armbruster, Willaim Rayas, MD  Observations/Objective: There were no vitals filed for this visit. Physical Exam Vitals reviewed.  Constitutional:      General: She is not in acute distress.    Appearance: Normal appearance. She is obese.  Pulmonary:     Effort: Pulmonary effort is normal. No respiratory distress.  Neurological:     General: No focal deficit present.     Mental Status: She is alert and oriented to person, place, and time.     Cranial Nerves: No cranial nerve deficit.     Motor: No weakness.  Psychiatric:        Mood and Affect: Mood normal.        Behavior: Behavior normal.        Thought Content: Thought content normal.        Judgment: Judgment normal.     Assessment and Plan: Acute cough Assessment & Plan: Declines a cough syrup. I have sent a Rx for prednisone to take for 3 days. If symptoms worsen return call to office, if has shortness of breath she is advised to go to ER  Orders: -     predniSONE; Take 2 tabs daily x 3 days  Dispense: 6 tablet; Refill: 0    Follow Up Instructions: No follow-ups on file.   I discussed the assessment and treatment plan with the patient. The patient was provided an opportunity to ask questions, and all were answered. The patient agreed with the plan and demonstrated an understanding of the instructions.   The patient was advised to call back or seek an in-person evaluation if the symptoms worsen or if the condition fails to improve as anticipated.  The above assessment and management plan was discussed with the patient. The patient verbalized understanding of and has  agreed to the management plan.   Jeanell Sparrow, FNP, have reviewed all documentation for this visit. The documentation on 07/01/23 for the exam, diagnosis, procedures, and orders are all accurate and complete.

## 2023-07-01 NOTE — Assessment & Plan Note (Signed)
Declines a cough syrup. I have sent a Rx for prednisone to take for 3 days. If symptoms worsen return call to office, if has shortness of breath she is advised to go to ER

## 2023-07-02 ENCOUNTER — Ambulatory Visit: Payer: Medicare PPO | Admitting: Podiatry

## 2023-07-06 ENCOUNTER — Ambulatory Visit: Payer: Medicare PPO | Admitting: Podiatry

## 2023-07-06 NOTE — Telephone Encounter (Unsigned)
 Copied from CRM [id number]. Topic: Clinical - Medical Advice >> Jul 06, 2023  9:03 AM Gildardo Pounds wrote: Reason for CRM: Patient still has the cough from appointment on 06/29/2023. She has finished the medication prescribed. Patient states she cannot wait until 07/09/2023 to be seen which is the next appointment available. Callback number is [phone]

## 2023-07-14 ENCOUNTER — Ambulatory Visit: Payer: Medicare PPO | Admitting: Licensed Clinical Social Worker

## 2023-07-14 DIAGNOSIS — F4323 Adjustment disorder with mixed anxiety and depressed mood: Secondary | ICD-10-CM | POA: Diagnosis not present

## 2023-07-14 NOTE — Progress Notes (Signed)
 [facility] Counselor Initial Adult Exam  Name: Jasmine Lopez Date: 07/14/2023 MRN: [id number] DOB: 04/16/1941 PCP: [name], FNP  Time Spent: 11:03  am - 12:05 pm : 62 Minutes  Guardian/Payee:  self/adult    Paperwork requested: No   Reason for Visit /Presenting Problem: feeling more anxious and fearful, moody and hard to get out of a mood-anxious and depression   Mental Status Exam: Appearance:   Well Groomed     Behavior:  Appropriate and Sharing  Motor:  Normal  Speech/Language:   Normal Rate  Affect:  Appropriate  Mood:  anxious  Thought process:  goal directed  Thought content:    WNL  Sensory/Perceptual disturbances:    WNL  Orientation:  oriented to person, place, and time/date  Attention:  Good  Concentration:  Good  Memory:  WNL  Fund of knowledge:   Good  Insight:    Good  Judgment:   Good  Impulse Control:  Good   Reported Symptoms:  anxious thoughts, fearful and aging thoughts  Risk Assessment: Danger to Self:  No Self-injurious Behavior: No Danger to Others: No Duty to Warn:no Physical Aggression / Violence:No  Access to Firearms a concern: No  Gang Involvement:No  Patient / guardian was educated about steps to take if suicide or homicide risk level increases between visits: yes While future psychiatric events cannot be accurately predicted, the patient does not currently require acute inpatient psychiatric care and does not currently meet [facility] involuntary commitment criteria.  Substance Abuse History: Current substance abuse: No     Caffeine: Tobacco: Alcohol: Substance use:  Past Psychiatric History:   No previous psychological problems have been observed Outpatient Providers:None History of Psych Hospitalization: No  Psychological Testing:  None    Abuse History:  Victim of: No.,  None    Report needed: No. Victim of Neglect:No. Perpetrator of  None   Witness / Exposure to Domestic Violence: No    Protective Services Involvement: No  Witness to [employer] Violence:  No   Family History:  Family History  Problem Relation Age of Onset   Heart failure Mother        CAD   COPD Mother    Hypertension Mother    Cancer Father    Rectal cancer Maternal Grandmother        Unsure   Colon cancer Maternal Grandmother    Heart attack Maternal Grandfather    Esophageal cancer Neg Hx    Stomach cancer Neg Hx    Breast cancer Neg Hx     Living situation: the patient lives alone  Sexual Orientation: Straight  Relationship Status: widowed  Name of spouse / other:Deceased If a parent, number of children / ages:2 adult sons   Support Systems: Family  Financial Stress:  No   Income/Employment/Disability: Dance movement psychotherapist and Occupational psychologist Service: No   Educational History: Education: post Engineer, maintenance (IT) work or degree  Religion/Sprituality/World View:[employer]    Any cultural differences that may affect / interfere with treatment:  not applicable   Recreation/Hobbies: Shopping, yoga and exercise  Stressors: Health problems   Legal issue   Loss of husband and mother and step grandson   Marital or family conflict   Medication change or noncompliance    Strengths: Supportive Relationships, Family, Church, and Spirituality  Barriers:  none   Legal History: Pending legal issue / charges: The patient has no significant history of legal issues. History of legal issue / charges:  None  Medical History/Surgical History: not reviewed Past Medical History:  Diagnosis Date   Anxiety    Breast cancer (HCC)    Cancer (HCC)    Diabetes mellitus without complication (HCC)    Dysphagia    GERD (gastroesophageal reflux disease) 10/12/2020   Hair loss 10/12/2020   Heart murmur    Hx of adenomatous polyp of colon 06/2015   Dr. Hyman [facility]- [facility]   Hypertension    Irregular heart beat    Sleep apnea    SVT (supraventricular tachycardia) (HCC) 08/11/2022     Past Surgical History:  Procedure Laterality Date   BREAST EXCISIONAL BIOPSY Bilateral    BREAST LUMPECTOMY Left 2016    Medications: Current Outpatient Medications  Medication Sig Dispense Refill   allopurinol (ZYLOPRIM) 100 MG tablet TAKE 1 TABLET(100 MG) BY MOUTH DAILY 90 tablet 1   amLODipine (NORVASC) 10 MG tablet Take 1 tablet (10 mg total) by mouth daily. 90 tablet 3   benzonatate (TESSALON PERLES) 100 MG capsule Take 1 capsule (100 mg total) by mouth 3 (three) times daily as needed for cough. 30 capsule 0   Biotin 1000 MCG tablet Take 1,000 mcg by mouth daily.     Blood Glucose Monitoring Suppl DEVI Use to check glucose twice daily. May substitute to any manufacturer covered by patient's insurance. 1 each 0   buPROPion (WELLBUTRIN XL) 150 MG 24 hr tablet Take 1 tablet (150 mg total) by mouth daily. 30 tablet 5   carvedilol (COREG) 6.25 MG tablet Take 1 tablet (6.25 mg total) by mouth 2 (two) times daily with a meal. 180 tablet 3   cetirizine (ZYRTEC) 10 MG tablet Take 10 mg by mouth daily.     Cholecalciferol (VITAMIN D3) 125 MCG (5000 UT) CAPS Take 1 capsule by mouth daily.     COD LIVER OIL PO Take 1 capsule by mouth daily.     Dulaglutide (TRULICITY) 0.75 MG/0.5ML SOAJ Inject 0.75 mg into the skin once a week. 2 mL 1   Garlic 1000 MG CAPS Take 1 capsule by mouth daily.     Glucose Blood (BLOOD GLUCOSE TEST STRIPS) STRP Use to check glucose twice daily. May substitute to any manufacturer covered by patient's insurance. 100 strip 3   Lancet Device MISC Use to check glucose twice daily. May substitute to any manufacturer covered by patient's insurance. 1 each 0   Lancets Misc. (ACCU-CHEK FASTCLIX LANCET) KIT USE TO CHECK GLUCOSE TWICE DAILY 1 kit 1   Lancets Misc. MISC May substitute to any manufacturer covered by patient's insurance. 100 each 3   metFORMIN (GLUCOPHAGE-XR) 500 MG 24 hr tablet Take 1 tablet (500 mg total) by mouth daily. 90 tablet 1   Misc Natural Products  (TURMERIC CURCUMIN) CAPS Take 1 capsule by mouth daily.     Multiple Vitamin (MULTIVITAMIN) capsule Take 1 capsule by mouth daily.     predniSONE (DELTASONE) 20 MG tablet Take 2 tabs daily x 3 days 6 tablet 0   rosuvastatin (CRESTOR) 20 MG tablet TAKE 1 TABLET(20 MG) BY MOUTH DAILY 90 tablet 1   valsartan-hydrochlorothiazide (DIOVAN HCT) 160-12.5 MG tablet Take 1 tablet by mouth daily. 90 tablet 1   Current Facility-Administered Medications  Medication Dose Route Frequency Provider Last Rate Last Admin   0.9 %  sodium chloride infusion  500 mL Intravenous Once Armbruster, Willaim Rayas, MD        Allergies  Allergen Reactions   Ace Inhibitors Cough   Atorvastatin Other (See Comments)  Leg pain    Benadryl [Diphenhydramine] Itching    Benadryl cream   Peppermint Flavor [Flavoring Agent (Non-Screening)]     Causes horsenss    Diagnoses:  Adj with mixed anxiety and depression  Psychiatric Treatment: No , None  Plan of Care: in Person/Virtual  Narrative:  [name] participated from office with therapist and consented to treatment. We reviewed the limits of confidentiality prior to the start of the evaluation. Terin J Whitener expressed understanding and agreement to proceed.     A follow-up was scheduled to create a treatment plan and begin treatment. Therapist answered  and all questions during the evaluation and contact information was provided.    Anselmo Pickler, [facility]

## 2023-07-15 ENCOUNTER — Ambulatory Visit (INDEPENDENT_AMBULATORY_CARE_PROVIDER_SITE_OTHER): Payer: Medicare PPO | Admitting: Nurse Practitioner

## 2023-07-15 ENCOUNTER — Encounter: Payer: Self-pay | Admitting: Nurse Practitioner

## 2023-07-15 VITALS — BP 136/78 | HR 68 | Temp 98.6°F | Ht 63.0 in | Wt 192.4 lb

## 2023-07-15 DIAGNOSIS — E6609 Other obesity due to excess calories: Secondary | ICD-10-CM

## 2023-07-15 DIAGNOSIS — I10 Essential (primary) hypertension: Secondary | ICD-10-CM

## 2023-07-15 DIAGNOSIS — Z2821 Immunization not carried out because of patient refusal: Secondary | ICD-10-CM

## 2023-07-15 DIAGNOSIS — E1169 Type 2 diabetes mellitus with other specified complication: Secondary | ICD-10-CM

## 2023-07-15 DIAGNOSIS — E66811 Obesity, class 1: Secondary | ICD-10-CM | POA: Diagnosis not present

## 2023-07-15 DIAGNOSIS — E782 Mixed hyperlipidemia: Secondary | ICD-10-CM | POA: Diagnosis not present

## 2023-07-15 DIAGNOSIS — Z6834 Body mass index (BMI) 34.0-34.9, adult: Secondary | ICD-10-CM | POA: Diagnosis not present

## 2023-07-15 DIAGNOSIS — E785 Hyperlipidemia, unspecified: Secondary | ICD-10-CM

## 2023-07-15 DIAGNOSIS — R051 Acute cough: Secondary | ICD-10-CM | POA: Diagnosis not present

## 2023-07-15 DIAGNOSIS — F330 Major depressive disorder, recurrent, mild: Secondary | ICD-10-CM | POA: Diagnosis not present

## 2023-07-15 NOTE — Progress Notes (Addendum)
 Madelaine Bhat, CMA,acting as a Neurosurgeon for [name], FNP.,have documented all relevant documentation on the behalf of [name], FNP,as directed by  [name], FNP while in the presence of [name], FNP.  Subjective:  Patient ID: Jasmine Lopez , female    DOB: February 04, 1941 , 83 y.o.   MRN: [id number]  Chief Complaint  Patient presents with   Hypertension    HPI  Patient presents today for a bp and dm follow up, Patient reports compliance with medication. Patient denies any chest pain, SOB, or headaches. Patient reports she has been coughing for 2 weeks. Patient reports she took OTC medications. She was treated with prednisone 3 weeks ago. Continues to have chest congestion. Just began having relief in the last week. She has very little secretions now. She has not been taking any cough syrup since Sunday. She reports it is getting better. She was concerned because she has a lingering cough/mucous. She is drinking at least 3 bottles of water. She is avoiding dairy products.   She seen a counselor International aid/development worker yesterday at [employer].  She was switched to Trulicity due to the cost of Ozempic but was still expensive $300.   Hypertension This is a chronic problem. The current episode started more than 1 year ago. The problem is unchanged. The problem is controlled. Pertinent negatives include no anxiety, chest pain, headaches or shortness of breath. Risk factors for coronary artery disease include obesity, dyslipidemia and diabetes mellitus. Past treatments include calcium channel blockers. There are no compliance problems.  There is no history of angina. There is no history of chronic renal disease.  Diabetes She presents for her follow-up diabetic visit. She has type 2 diabetes mellitus. Her disease course has been stable. Pertinent negatives for hypoglycemia include no dizziness or headaches. Pertinent negatives for diabetes include no chest pain, no fatigue, no polydipsia, no  polyphagia and no polyuria. There are no hypoglycemic complications. There are no diabetic complications. Risk factors for coronary artery disease include obesity, hypertension and dyslipidemia. Current diabetic treatment includes oral agent (monotherapy). She is compliant with treatment most of the time. Her weight is increasing steadily. She is following a generally healthy diet. When asked about meal planning, she reported none. She has not had a previous visit with a dietitian. She participates in exercise intermittently. (Blood sugar was 180 once and had a blood sugar of 150. ) An ACE inhibitor/angiotensin II receptor blocker is being taken. She does not see a podiatrist.Eye exam is current.    Past Medical History:  Diagnosis Date   Anxiety    Breast cancer (HCC)    Cancer (HCC)    Diabetes mellitus without complication (HCC)    Dysphagia    GERD (gastroesophageal reflux disease) 10/12/2020   Hair loss 10/12/2020   Heart murmur    Hx of adenomatous polyp of colon 06/2015   Dr. Hyman [facility]- [facility]   Hypertension    Irregular heart beat    Sleep apnea    SVT (supraventricular tachycardia) (HCC) 08/11/2022     Family History  Problem Relation Age of Onset   Heart failure Mother        CAD   COPD Mother    Hypertension Mother    Cancer Father    Rectal cancer Maternal Grandmother        Unsure   Colon cancer Maternal Grandmother    Heart attack Maternal Grandfather    Esophageal cancer Neg Hx    Stomach  cancer Neg Hx    Breast cancer Neg Hx      Current Outpatient Medications:    allopurinol (ZYLOPRIM) 100 MG tablet, TAKE 1 TABLET(100 MG) BY MOUTH DAILY, Disp: 90 tablet, Rfl: 1   amLODipine (NORVASC) 10 MG tablet, Take 1 tablet (10 mg total) by mouth daily., Disp: 90 tablet, Rfl: 3   benzonatate (TESSALON PERLES) 100 MG capsule, Take 1 capsule (100 mg total) by mouth 3 (three) times daily as needed for cough. (Patient not taking: Reported on 07/27/2023), Disp: 30  capsule, Rfl: 0   Biotin 1000 MCG tablet, Take 1,000 mcg by mouth daily., Disp: , Rfl:    Blood Glucose Monitoring Suppl DEVI, Use to check glucose twice daily. May substitute to any manufacturer covered by patient's insurance., Disp: 1 each, Rfl: 0   buPROPion (WELLBUTRIN XL) 150 MG 24 hr tablet, Take 1 tablet (150 mg total) by mouth daily., Disp: 30 tablet, Rfl: 5   carvedilol (COREG) 6.25 MG tablet, Take 1 tablet (6.25 mg total) by mouth 2 (two) times daily with a meal., Disp: 180 tablet, Rfl: 3   cetirizine (ZYRTEC) 10 MG tablet, Take 10 mg by mouth daily., Disp: , Rfl:    Cholecalciferol (VITAMIN D3) 125 MCG (5000 UT) CAPS, Take 1 capsule by mouth daily., Disp: , Rfl:    COD LIVER OIL PO, Take 1 capsule by mouth daily., Disp: , Rfl:    Garlic 1000 MG CAPS, Take 1 capsule by mouth daily., Disp: , Rfl:    Glucose Blood (BLOOD GLUCOSE TEST STRIPS) STRP, Use to check glucose twice daily. May substitute to any manufacturer covered by patient's insurance., Disp: 100 strip, Rfl: 3   Lancet Device MISC, Use to check glucose twice daily. May substitute to any manufacturer covered by patient's insurance., Disp: 1 each, Rfl: 0   Lancets Misc. (ACCU-CHEK FASTCLIX LANCET) KIT, USE TO CHECK GLUCOSE TWICE DAILY, Disp: 1 kit, Rfl: 1   metFORMIN (GLUCOPHAGE-XR) 500 MG 24 hr tablet, Take 1 tablet (500 mg total) by mouth daily., Disp: 90 tablet, Rfl: 1   Misc Natural Products (TURMERIC CURCUMIN) CAPS, Take 1 capsule by mouth daily., Disp: , Rfl:    Multiple Vitamin (MULTIVITAMIN) capsule, Take 1 capsule by mouth daily., Disp: , Rfl:    rosuvastatin (CRESTOR) 20 MG tablet, TAKE 1 TABLET(20 MG) BY MOUTH DAILY, Disp: 90 tablet, Rfl: 1   Semaglutide (RYBELSUS) 3 MG TABS, Take 1 tablet (3 mg total) by mouth daily. Take 30 minutes before breakfast (Patient not taking: Reported on 07/27/2023), Disp: 30 tablet, Rfl: 1   valsartan-hydrochlorothiazide (DIOVAN HCT) 160-12.5 MG tablet, Take 1 tablet by mouth daily., Disp: 90  tablet, Rfl: 1  Current Facility-Administered Medications:    0.9 %  sodium chloride infusion, 500 mL, Intravenous, Once, Armbruster, Willaim Rayas, MD   Allergies  Allergen Reactions   Ace Inhibitors Cough   Atorvastatin Other (See Comments)    Leg pain    Benadryl [Diphenhydramine] Itching    Benadryl cream   Peppermint Flavor [Flavoring Agent (Non-Screening)]     Causes horsenss     Review of Systems  Constitutional: Negative.  Negative for fatigue.  HENT: Negative.    Eyes: Negative.   Respiratory: Negative.  Negative for shortness of breath and wheezing.   Cardiovascular: Negative.  Negative for chest pain.  Gastrointestinal: Negative.   Endocrine: Negative for polydipsia, polyphagia and polyuria.  Neurological:  Negative for dizziness and headaches.  Psychiatric/Behavioral: Negative.       Today's Vitals  07/15/23 1055  BP: 136/78  Pulse: 68  Temp: 98.6 F (37 C)  TempSrc: Oral  SpO2: 98%  Weight: 192 lb 6.4 oz (87.3 kg)  Height: 5\' 3"  (1.6 m)  PainSc: 0-No pain   Body mass index is 34.08 kg/m.  Wt Readings from Last 3 Encounters:  07/27/23 188 lb (85.3 kg)  07/22/23 192 lb 6.4 oz (87.3 kg)  07/15/23 192 lb 6.4 oz (87.3 kg)      Objective:  Physical Exam Vitals reviewed.  Constitutional:      General: She is not in acute distress.    Appearance: Normal appearance. She is obese.  Cardiovascular:     Rate and Rhythm: Normal rate and regular rhythm.     Pulses: Normal pulses.     Heart sounds: Normal heart sounds. No murmur heard. Pulmonary:     Effort: Pulmonary effort is normal. No respiratory distress.     Breath sounds: No wheezing.  Skin:    General: Skin is warm.     Capillary Refill: Capillary refill takes less than 2 seconds.  Neurological:     General: No focal deficit present.     Mental Status: She is alert and oriented to person, place, and time.     Cranial Nerves: No cranial nerve deficit.     Motor: No weakness.  Psychiatric:         Mood and Affect: Mood normal.        Behavior: Behavior normal.        Thought Content: Thought content normal.        Judgment: Judgment normal.         Assessment And Plan:  Essential hypertension Assessment & Plan: Blood pressure is fairly controlled, continue current medications and focus on lifestyle modifications  Orders: -     BMP8+eGFR  Type 2 diabetes mellitus with hyperlipidemia (HCC) Assessment & Plan: Hemoglobin A1c was elevated at last visit.  She was switched from Ozempic to Trulicity but was still elevated and she is not interested in any of those medications. I will refer her to a nutritionist to focus on her diet  Orders: -     Hemoglobin A1c -     Amb ref to Medical Nutrition Therapy-MNT -     Rybelsus; Take 1 tablet (3 mg total) by mouth daily. Take 30 minutes before breakfast (Patient not taking: Reported on 07/27/2023)  Dispense: 30 tablet; Refill: 1  Mixed hyperlipidemia Assessment & Plan: LDL is not at goal she is advised to continue taking statin.  Will check lipid panel  Orders: -     Lipid panel -     Amb ref to Medical Nutrition Therapy-MNT  Mild episode of recurrent major depressive disorder [facility]) Assessment & Plan: She is now going to a Counselor with her first day being yesterday   Acute cough Assessment & Plan: Cough is improving, lung sounds are clear and equal bilaterally. Advised if worsens to return call to office. Encouraged to increase water and avoid dairy   COVID-19 vaccination declined Assessment & Plan: Declines covid 19 vaccine. Discussed risk of covid 32 and if she changes her mind about the vaccine to call the office. Education has been provided regarding the importance of this vaccine but patient still declined. Advised may receive this vaccine at local pharmacy or Health Dept.or vaccine clinic. Aware to provide a copy of the vaccination record if obtained from local pharmacy or Health Dept.  Encouraged to take multivitamin,  vitamin d,  vitamin c and zinc to increase immune system. Aware can call office if would like to have vaccine here at office. Verbalized acceptance and understanding.    Herpes zoster vaccination declined Assessment & Plan: Declines shingrix, educated on disease process and is aware if he changes his mind to notify office    Class 1 obesity due to excess calories with body mass index (BMI) of 34.0 to 34.9 in adult, unspecified whether serious comorbidity present Assessment & Plan: She is encouraged to strive for BMI less than 30 to decrease cardiac risk. Advised to aim for at least 150 minutes of exercise per week.      Return for controlled DM check 4 months.  Patient was given opportunity to ask questions. Patient verbalized understanding of the plan and was able to repeat key elements of the plan. All questions were answered to their satisfaction.    Jeanell Sparrow, FNP, have reviewed all documentation for this visit. The documentation on 07/15/23 for the exam, diagnosis, procedures, and orders are all accurate and complete.   IF YOU HAVE BEEN REFERRED TO A SPECIALIST, IT MAY TAKE 1-2 WEEKS TO SCHEDULE/PROCESS THE REFERRAL. IF YOU HAVE NOT HEARD FROM US/SPECIALIST IN TWO WEEKS, PLEASE GIVE [place] A CALL AT [phone].

## 2023-07-16 LAB — BMP8+EGFR
BUN/Creatinine Ratio: 17 (ref 12–28)
BUN: 15 mg/dL (ref 8–27)
CO2: 25 mmol/L (ref 20–29)
Calcium: 9.6 mg/dL (ref 8.7–10.3)
Chloride: 100 mmol/L (ref 96–106)
Creatinine, Ser: 0.9 mg/dL (ref 0.57–1.00)
Glucose: 194 mg/dL — ABNORMAL HIGH (ref 70–99)
Potassium: 4.6 mmol/L (ref 3.5–5.2)
Sodium: 137 mmol/L (ref 134–144)
eGFR: 65 mL/min/{1.73_m2} (ref >59)

## 2023-07-16 LAB — LIPID PANEL
Chol/HDL Ratio: 2.4 ratio (ref 0.0–4.4)
Cholesterol, Total: 134 mg/dL (ref 100–199)
HDL: 56 mg/dL (ref >39)
LDL Chol Calc (NIH): 66 mg/dL (ref 0–99)
Triglycerides: 56 mg/dL (ref 0–149)
VLDL Cholesterol Cal: 12 mg/dL (ref 5–40)

## 2023-07-16 LAB — HEMOGLOBIN A1C
Est. average glucose Bld gHb Est-mCnc: 229 mg/dL
Hgb A1c MFr Bld: 9.6 % — ABNORMAL HIGH (ref 4.8–5.6)

## 2023-07-19 ENCOUNTER — Encounter: Payer: Self-pay | Admitting: Nurse Practitioner

## 2023-07-19 DIAGNOSIS — Z2821 Immunization not carried out because of patient refusal: Secondary | ICD-10-CM | POA: Insufficient documentation

## 2023-07-19 MED ORDER — RYBELSUS 3 MG PO TABS
1.0000 | ORAL_TABLET | Freq: Every day | ORAL | 1 refills | Status: DC
Start: 2023-07-19 — End: 2023-10-15

## 2023-07-19 NOTE — Assessment & Plan Note (Signed)
 She is now going to a Counselor with her first day being yesterday

## 2023-07-19 NOTE — Assessment & Plan Note (Addendum)
 Hemoglobin A1c was elevated at last visit.  She was switched from Ozempic to Trulicity but was still elevated and she is not interested in any of those medications. I will refer her to a nutritionist to focus on her diet

## 2023-07-19 NOTE — Assessment & Plan Note (Signed)
 She is encouraged to strive for BMI less than 30 to decrease cardiac risk. Advised to aim for at least 150 minutes of exercise per week.

## 2023-07-19 NOTE — Assessment & Plan Note (Signed)
 LDL is not at goal she is advised to continue taking statin.  Will check lipid panel

## 2023-07-19 NOTE — Assessment & Plan Note (Signed)
 Blood pressure is fairly controlled, continue current medications and focus on lifestyle modifications

## 2023-07-19 NOTE — Assessment & Plan Note (Signed)
 Declines shingrix, educated on disease process and is aware if he changes his mind to notify office

## 2023-07-19 NOTE — Assessment & Plan Note (Signed)

## 2023-07-19 NOTE — Assessment & Plan Note (Addendum)
 Cough is improving, lung sounds are clear and equal bilaterally. Advised if worsens to return call to office. Encouraged to increase water and avoid dairy

## 2023-07-22 ENCOUNTER — Encounter: Payer: Self-pay | Admitting: Podiatry

## 2023-07-22 ENCOUNTER — Ambulatory Visit: Admitting: Podiatry

## 2023-07-22 VITALS — Ht 63.0 in | Wt 192.4 lb

## 2023-07-22 DIAGNOSIS — M79674 Pain in right toe(s): Secondary | ICD-10-CM

## 2023-07-22 DIAGNOSIS — E1169 Type 2 diabetes mellitus with other specified complication: Secondary | ICD-10-CM | POA: Diagnosis not present

## 2023-07-22 DIAGNOSIS — B351 Tinea unguium: Secondary | ICD-10-CM

## 2023-07-22 DIAGNOSIS — M79675 Pain in left toe(s): Secondary | ICD-10-CM | POA: Diagnosis not present

## 2023-07-22 DIAGNOSIS — E785 Hyperlipidemia, unspecified: Secondary | ICD-10-CM

## 2023-07-22 NOTE — Progress Notes (Signed)
 Subjective:  Patient ID: Jasmine Lopez, female    DOB: 24-Nov-1940,  MRN: [id number]  ANET LOGSDON presents to clinic today for: preventative diabetic foot care and painful, elongated thickened toenails x 10 which are symptomatic when wearing enclosed shoe gear. This interferes with his/her daily activities. Patient states her left great toe is sore at the medial border. Chief Complaint  Patient presents with   Nail Problem    Pt is here for [facility] last A1C was 7.9 PCP is Dr Christell Constant and LOV was last week.    PCP is [name], FNP.  Allergies  Allergen Reactions   Ace Inhibitors Cough   Atorvastatin Other (See Comments)    Leg pain    Benadryl [Diphenhydramine] Itching    Benadryl cream   Peppermint Flavor [Flavoring Agent (Non-Screening)]     Causes horsenss    Review of Systems: Negative except as noted in the HPI.  Objective: No changes noted in today's physical examination. There were no vitals filed for this visit.  RANDELL DETTER is a pleasant 83 y.o. female in NAD. AAO x 3.  Vascular Examination: Capillary refill time <3 seconds b/l LE. Palpable pedal pulses b/l LE. Digital hair present b/l. No pedal edema b/l. Skin temperature gradient WNL b/l. No varicosities b/l. Trace edema noted BLE.Marland Kitchen  Dermatological Examination: Pedal skin with normal turgor, texture and tone b/l. No open wounds. No interdigital macerations b/l. Toenails 1-5 b/l thickened, discolored, dystrophic with subungual debris. There is pain on palpation to dorsal aspect of nailplates. Incurvated nailplate medial border left hallux.  Nail border hypertrophy absent. There is tenderness to palpation. Sign(s) of infection: no clinical signs of infection noted on examination today.. No corns, calluses nor porokeratotic lesions noted..  Neurological Examination: Protective sensation intact with 10 gram monofilament b/l LE. Vibratory sensation intact b/l LE.   Musculoskeletal Examination: Muscle strength  5/5 to all lower extremity muscle groups bilaterally. No pain, crepitus or joint limitation noted with ROM bilateral LE. No gross bony deformities bilaterally.     Latest Ref Rng & Units 07/15/2023   11:37 AM 04/16/2023   11:59 AM 01/05/2023    9:11 AM 09/11/2022   11:22 AM  Hemoglobin A1C  Hemoglobin-A1c 4.8 - 5.6 % 9.6  7.5  7.5  8.0    Assessment/Plan: 1. Pain due to onychomycosis of toenails of both feet   2. Type 2 diabetes mellitus with hyperlipidemia (HCC)     -Patient's family member present. All questions/concerns addressed on today's visit. -Continue supportive shoe gear daily. -Toenails 2-5 bilaterally and right great toe debrided in length and girth without iatrogenic bleeding with sterile nail nipper and dremel.  -No invasive procedure(s) performed. Offending nail border debrided and curretaged left great toe utilizing sterile nail nipper and currette. Border cleansed with alcohol and triple antibiotic ointment applied. No further treatment required by patient/caregiver. Call office if there are any concerns. -Patient/POA to call should there be question/concern in the interim.   Return in about 3 months (around 10/22/2023).  Freddie Breech, DPM      [place] LOCATION: [address], [place] [address]  Office [phone]   [facility] LOCATION: [address] [place], [place] [address] Office [phone]

## 2023-07-27 ENCOUNTER — Encounter: Payer: Self-pay | Admitting: Dietician

## 2023-07-27 ENCOUNTER — Encounter: Attending: Nurse Practitioner | Admitting: Dietician

## 2023-07-27 ENCOUNTER — Encounter: Payer: Self-pay | Admitting: Podiatry

## 2023-07-27 VITALS — Ht 63.5 in | Wt 188.0 lb

## 2023-07-27 DIAGNOSIS — E785 Hyperlipidemia, unspecified: Secondary | ICD-10-CM | POA: Insufficient documentation

## 2023-07-27 DIAGNOSIS — E1169 Type 2 diabetes mellitus with other specified complication: Secondary | ICD-10-CM | POA: Diagnosis not present

## 2023-07-27 NOTE — Progress Notes (Signed)
 Medical Nutrition Therapy  Appointment Start time:  69  Appointment End time:  1145  Primary concerns today: Patient states that she has gone through a lot in life and wants to get back on track.  She will also go to counseling.  Her husband passed about 5 years ago and her mother a short time later.  She completed a program at the [facility] 2 years ago that helped her lose weight and control her blood sugar.  She is now going to the senior center, taking classes such as yoga and walking. Fasting blood glucose 150  Referral diagnosis: Type 2 Diabetes, HLD Preferred learning style: no preference indicated Learning readiness:  ready, change in progress   NUTRITION ASSESSMENT  63.5" 188 lbs 07/27/2023 192 lbs 07/15/2023  Clinical Medical Hx: Type 2 Diabetes (2020), HLD, HTN, OSA (no c-pap), GERD Medications: Metformin, Rybelsus (hasn't started this yet) Labs: A1c 9.6% 07/15/2023 increased from 7.5% 04/16/2023, Vitamin B-12 670 in 2021, Vitamin D 54 in 2021, eGFR 65 07/15/2023 Lipid Panel     Component Value Date/Time   CHOL 134 07/15/2023 1137   TRIG 56 07/15/2023 1137   HDL 56 07/15/2023 1137   CHOLHDL 2.4 07/15/2023 1137   LDLCALC 66 07/15/2023 1137   LABVLDL 12 07/15/2023 1137   Notable Signs/Symptoms: none  Lifestyle & Dietary Hx Patient lives alone.  She moved from [place] and lives in the home she grew up in.  She has family locally.  She eats out about 3 times per week. She is retired Teacher, early years/pre. Doesn't tolerate increased amounts of dairy as this causes more sinus issues and coughing. Lower carb and lower sodium  Estimated daily fluid intake: 70 oz Supplements: Turmeric curcumin, garlic, MVI, biotin, vitamin D, Cod liver oil Sleep: good 6-7 hours and wakes 2-3 times per night Stress / self-care: moderate Current average weekly physical activity: She is now going to the senior center for classes 2 times per week.    24-Hr Dietary Recall First Meal: Keifer OR  boiled egg, lean sausage, [employer] toast (dry), occasional fruit OR oatmeal with fruit, honey OR egg McMuffin 1x/week Snack: none Second Meal: salad with protein OR soup and sandwich, occasional cookie Snack: pickled beets Third Meal: collard greens, chicken, baked sweet potato with cinnamon and butter Snack: occasional cheese, crackers Beverages: water, coffee with stevia and half and half, occasional diet Dr. Reino Kent  NUTRITION DIAGNOSIS  NB-1.1 Food and nutrition-related knowledge deficit As related to balance of carbohydrates, protein, and fat.  As evidenced by diet hx and patient report.   NUTRITION INTERVENTION  Nutrition education (E-1) on the following topics:  Counseling and diabetes education initiated.  Discussed My Royetta Crochet, the 3 main macronutrients and implications on blood glucose Discussed label reading Discussed benefits of increased activity and tips for any barriers Discussed basic physiology of Diabetes Instructed on blood glucose target, BG ranges pre and post meals, and A1C goals  Handouts Provided Include  How to Thrive:  A Guide for Your Journey with Diabetes by the ADA Meal Plan Card My Plate Snack list Diabetes Resources  Learning Style & Readiness for Change Teaching method utilized: Visual & Auditory  Demonstrated degree of understanding via: Teach Back  Barriers to learning/adherence to lifestyle change: none  Goals Established by Pt Plan:  Aim for 2-3 Carb Choices per meal (30-45 grams) +/- 1 either way  Aim for 0-1 Carbs per snack if hungry  Include protein in moderation with your meals and snacks Consider reading food  labels for Total Carbohydrate of foods Consider  increasing your activity level by walking or class for 30 or more minutes daily as tolerated Consider checking BG at alternate times per day  Consider taking medication as directed by MD   MONITORING & EVALUATION Dietary intake, weekly physical activity, and label reading in 2  months.  Next Steps  Patient is to call for questions.

## 2023-07-27 NOTE — Patient Instructions (Signed)
 Plan:  Aim for 2-3 Carb Choices per meal (30-45 grams) +/- 1 either way  Aim for 0-1 Carbs per snack if hungry  Include protein in moderation with your meals and snacks Consider reading food labels for Total Carbohydrate of foods Consider  increasing your activity level by walking or class for 30 or more minutes daily as tolerated Consider checking BG at alternate times per day  Consider taking medication as directed by MD

## 2023-07-28 ENCOUNTER — Ambulatory Visit: Admitting: Licensed Clinical Social Worker

## 2023-07-28 DIAGNOSIS — F4323 Adjustment disorder with mixed anxiety and depressed mood: Secondary | ICD-10-CM | POA: Diagnosis not present

## 2023-07-28 NOTE — Progress Notes (Addendum)
 [facility] Counselor/Therapist Progress Note  Patient ID: Jasmine Lopez, MRN: [id number]   Date: 07/28/23  Time Spent: 9:01  am - 10:02 am : 61 Minutes  Treatment Type: Individual Therapy.  Reported Symptoms: holding herself accountable for her depression and anxiety and feeling optimistic and moving forward  Mental Status Exam: Appearance:  Well Groomed     Behavior: Sharing  Motor: Normal  Speech/Language:  Clear and Coherent  Affect: Appropriate  Mood: normal  Thought process: normal  Thought content:   WNL  Sensory/Perceptual disturbances:   WNL  Orientation: oriented to person, place, and time/date  Attention: Good  Concentration: Good  Memory: WNL  Fund of knowledge:  Good  Insight:   Good  Judgment:  Good  Impulse Control: Good   Risk Assessment: Danger to Self:  No Self-injurious Behavior: No Danger to Others: No Duty to Warn:no Physical Aggression / Violence:No  Access to Firearms a concern: No  Gang Involvement:No   Subjective:   [name] participated from home, via video and consented to treatment. Therapist participated from home office. I discussed the limitations of evaluation and management by telemedicine and the availability of in person appointments. The patient expressed understanding and agreed to proceed. Anu reviewed the events of the past week.      We reviewed numerous treatment approaches including CBT and Solution focused therapy. Psych-education regarding the Adalene's diagnosis of No diagnosis found. was provided during the session. We discussed Aralynn J Lux's goals treatment goals which include Adj with mixed anxiety and depression . [name] provided verbal approval of the treatment plan.   Interventions: Psycho-education & Goal Setting.   Diagnosis:  Adjustment disorder with mixed anxiety and depressed mood   Psychiatric Treatment: No , N/A   Treatment Plan:  Client  Abilities/Strengths Maryelizabeth is open and mindful of her areas of lack and holding herself accountable.    Support System: Family and Friends  Copywriter, advertising Level Weekly  Symptoms  Lighter/Better   (Status: improved) Clear thinking   (Status: improved)  Goals:   Eli agreed to set goal of  identifying the causes of her stressors, triggers/glimmers, creating routines and order to manage physical health diagnosis, and mood regulation.     Treatment plan signed and available on s-drive:  Yes    Target Date: 09/08/23 Frequency: Weekly  Progress: 0 Modality: individual    Therapist will provide referrals for additional resources as appropriate.  Therapist will provide psycho-education regarding [place]'s diagnosis and corresponding treatment approaches and interventions. Licensed Clinical Mental Health Counselor, Anselmo Pickler, [facility] will support the patient's ability to achieve the goals identified. will employ CBT, BA, Problem-solving, Solution Focused, Mindfulness,  coping skills, & other evidenced-based practices will be used to promote progress towards healthy functioning to help manage decrease symptoms associated with her diagnosis.   Reduce overall level, frequency, and intensity of the feelings of depression, anxiety and panic evidenced by decreased anxious thoughts and negative self talk. Between feelings of depression, anxiety and their impact on thinking patterns and behaviors. Verbalize an understanding of the role that distorted thinking plays in creating fears, excessive worry, and ruminations.    English as a second language teacher participated in the creation of the treatment plan)    Anselmo Pickler, [facility]

## 2023-08-03 ENCOUNTER — Ambulatory Visit (INDEPENDENT_AMBULATORY_CARE_PROVIDER_SITE_OTHER): Admitting: Licensed Clinical Social Worker

## 2023-08-03 DIAGNOSIS — F4323 Adjustment disorder with mixed anxiety and depressed mood: Secondary | ICD-10-CM

## 2023-08-03 NOTE — Progress Notes (Signed)
 updated

## 2023-08-03 NOTE — Progress Notes (Signed)
  [facility] Counselor/Therapist Progress Note  Patient ID: Jasmine Lopez, MRN: [id number]    Date: 08/03/23  Time Spent: 9:01  am - 10:00 am : 59 Minutes  Treatment Type: Individual Therapy.  Reported Symptoms: has noticed that she is feeling more positive, having to talk herself into calm to reduce her anxiety and depression, perception of negative actions  Mental Status Exam: Appearance:  Well Groomed     Behavior: Appropriate and Sharing  Motor: Normal  Speech/Language:  Normal Rate  Affect: Appropriate  Mood: normal  Thought process: normal  Thought content:   WNL  Sensory/Perceptual disturbances:   WNL  Orientation: oriented to person, place, and time/date  Attention: Good  Concentration: Good  Memory: WNL  Fund of knowledge:  Good  Insight:   Good  Judgment:  Good  Impulse Control: Good   Risk Assessment: Danger to Self:  No Self-injurious Behavior: No Danger to Others: No Duty to Warn:no Physical Aggression / Violence:No  Access to Firearms a concern: No  Gang Involvement:No   Subjective:   Jasmine Lopez participated from home, via video, and consented to treatment. I discussed the limitations of evaluation and management by telemedicine and the availability of in person appointments. The patient expressed understanding and agreed to proceed.  Therapist participated from home office.  Jasmine Lopez reviewed the events of the past week.      Interventions: Cognitive Behavioral Therapy, Solution-Oriented/Positive Psychology, and Psycho-education/Bibliotherapy  Diagnosis:  Adjustment disorder with mixed anxiety and depressed mood  Psychiatric Treatment: No , N/A  Treatment Plan:  Client Abilities/Strengths Jasmine Lopez is   Support System: Family   Jasmine Lopez would like to stop services at this time because she found out this service is out of her network.   She will be  contacting her insurance to determine if she can return.    Treatment Level Weekly  Symptoms  Present/Practical   (Status: improved) Memory Fog   (Status: maintained)  Goals:   Jasmine Lopez experiences symptoms of feeling paralyzed living in a "negative vein".  She is aware she has the ability to do and think about strategies into words and action.  She is becoming more aware of her negative thinking patterns.     Target Date: 09/08/23 Frequency: Weekly  Progress: 25 Modality: individual    Therapist will provide referrals for additional resources as appropriate.  Therapist will provide psycho-education regarding Jasmine Lopez's diagnosis and corresponding treatment approaches and interventions. Licensed Clinical Mental Health Counselor, Anselmo Pickler, [facility] will support the patient's ability to achieve the goals identified. will employ CBT, BA, Problem-solving, Solution Focused, Mindfulness,  coping skills, & other evidenced-based practices will be used to promote progress towards healthy functioning to help manage decrease symptoms associated with her diagnosis.   Reduce overall level, frequency, and intensity of the feelings of depression, anxiety and panic evidenced by decreased negative thinking and mindfully connecting with the present moment.   Verbally express understanding of the relationship between feelings of depression, anxiety and their impact on thinking patterns and behaviors. Verbalize an understanding of the role that distorted thinking plays in creating fears, excessive worry, and ruminations.  Jasmine Lopez participated in the creation of the treatment plan)   Anselmo Pickler, [facility]

## 2023-08-04 ENCOUNTER — Telehealth: Payer: Self-pay

## 2023-08-04 NOTE — Telephone Encounter (Signed)
 Called patient to get scheduled for 6 week follow up via mychart. LVM for patient.

## 2023-08-06 ENCOUNTER — Encounter (HOSPITAL_BASED_OUTPATIENT_CLINIC_OR_DEPARTMENT_OTHER): Payer: Self-pay | Admitting: Cardiovascular Disease

## 2023-08-06 ENCOUNTER — Ambulatory Visit (HOSPITAL_BASED_OUTPATIENT_CLINIC_OR_DEPARTMENT_OTHER): Payer: Medicare PPO | Admitting: Cardiovascular Disease

## 2023-08-06 VITALS — BP 112/70 | HR 65 | Ht 64.0 in | Wt 184.4 lb

## 2023-08-06 DIAGNOSIS — R400 Somnolence: Secondary | ICD-10-CM | POA: Diagnosis not present

## 2023-08-06 DIAGNOSIS — R0683 Snoring: Secondary | ICD-10-CM

## 2023-08-06 DIAGNOSIS — I471 Supraventricular tachycardia, unspecified: Secondary | ICD-10-CM | POA: Diagnosis not present

## 2023-08-06 DIAGNOSIS — G4730 Sleep apnea, unspecified: Secondary | ICD-10-CM | POA: Diagnosis not present

## 2023-08-06 DIAGNOSIS — I5189 Other ill-defined heart diseases: Secondary | ICD-10-CM | POA: Diagnosis not present

## 2023-08-06 DIAGNOSIS — I10 Essential (primary) hypertension: Secondary | ICD-10-CM

## 2023-08-06 MED ORDER — CARVEDILOL 6.25 MG PO TABS: 6.2500 mg | ORAL_TABLET | Freq: Two times a day (BID) | ORAL | 3 refills | Status: AC

## 2023-08-06 MED ORDER — VALSARTAN-HYDROCHLOROTHIAZIDE 160-12.5 MG PO TABS: 1.0000 | ORAL_TABLET | Freq: Every day | ORAL | 3 refills | Status: AC

## 2023-08-06 NOTE — Patient Instructions (Addendum)
 Medication Instructions:  Your physician recommends that you continue on your current medications as directed. Please refer to the Current Medication list given to you today.   *If you need a refill on your cardiac medications before your next appointment, please call your pharmacy*  Lab Work: NONE  Testing/Procedures: [facility] HOME SLEEP STUDY  Follow-Up: At [facility], you and your health needs are our priority.  As part of our continuing mission to provide you with exceptional heart care, we have created designated Provider Care Teams.  These Care Teams include your primary Cardiologist (physician) and Advanced Practice Providers (APPs -  Physician Assistants and Nurse Practitioners) who all work together to provide you with the care you need, when you need it.  We recommend signing up for the patient portal called "MyChart".  Sign up information is provided on this After Visit Summary.  MyChart is used to connect with patients for Virtual Visits (Telemedicine).  Patients are able to view lab/test results, encounter notes, upcoming appointments, etc.  Non-urgent messages can be sent to your provider as well.   To learn more about what you can do with MyChart, go to [email or web address].    Your next appointment:   12 month(s)  Provider:   Chilton Si, MD, Eligha Bridegroom, NP, or Gillian Shields, NP    INCREASE YOUR EXERCISE  WatchPAT?  Is a FDA cleared portable home sleep study test that uses a watch and 3 points of contact to monitor 7 different channels, including your heart rate, oxygen saturations, body position, snoring, and chest motion.  The study is easy to use from the comfort of your own home and accurately detect sleep apnea.  Before bed, you attach the chest sensor, attached the sleep apnea bracelet to your nondominant hand, and attach the finger probe.  After the study, the raw data is downloaded from the watch and scored for apnea events.   For more  information: [email or web address]  Patient Testing Instructions:  Do not put battery into the device until bedtime when you are ready to begin the test. Please call the support number if you need assistance after following the instructions below: 24 hour support line- [phone] or [facility] support at [phone] (option 2)  Download the IntelWatchPAT One" app through the google play store or App Store  Be sure to turn on or enable access to bluetooth in settlings on your smartphone/ device  Make sure no other bluetooth devices are on and within the vicinity of your smartphone/ device and WatchPAT watch during testing.  Make sure to leave your smart phone/ device plugged in and charging all night.  When ready for bed:  Follow the instructions step by step in the WatchPAT One App to activate the testing device. For additional instructions, including video instruction, visit the WatchPAT One video on Youtube. You can search for WatchPat One within Youtube (video is 4 minutes and 18 seconds) or enter: https://youtube/watch?[email or web address]_vbiwxE Please note: You will be prompted to enter a Pin to connect via bluetooth when starting the test. The PIN will be assigned to you when you receive the test.  The device is disposable, but it recommended that you retain the device until you receive a call letting you know the study has been received and the results have been interpreted.  We will let you know if the study did not transmit to [place] properly after the test is completed. You do not need to call us to confirm the receipt of the test.  Please complete the test within 48 hours of receiving PIN.   Frequently Asked Questions:  What is Watch Dennie Bible one?  A single use fully disposable home sleep apnea testing device and will not need to be returned after completion.  What are the requirements to use WatchPAT one?  The be able to have a successful watchpat one sleep study, you should have your  Watch pat one device, your smart phone, watch pat one app, your PIN number and Internet access What type of phone do I need?  You should have a smart phone that uses Android 5.1 and above or any Iphone with IOS 10 and above How can I download the WatchPAT one app?  Based on your device type search for WatchPAT one app either in google play for android devices or APP store for Iphone's Where will I get my PIN for the study?  Your PIN will be provided by your physician's office. It is used for authentication and if you lose/forget your PIN, please reach out to your providers office.  I do not have Internet at home. Can I do WatchPAT one study?  WatchPAT One needs Internet connection throughout the night to be able to transmit the sleep data. You can use your home/local internet or your cellular's data package. However, it is always recommended to use home/local Internet. It is estimated that between 20MB-30MB will be used with each study.However, the application will be looking for space in the phone to start the study.  What happens if I lose internet or bluetooth connection?  During the internet disconnection, your phone will not be able to transmit the sleep data. All the data, will be stored in your phone. As soon as the internet connection is back on, the phone will being sending the sleep data. During the bluetooth disconnection, WatchPAT one will not be able to to send the sleep data to your phone. Data will be kept in the [facility] one until two devices have bluetooth connection back on. As soon as the connection is back on, WatchPAT one will send the sleep data to the phone.  How long do I need to wear the WatchPAT one?  After you start the study, you should wear the device at least 6 hours.  How far should I keep my phone from the device?  During the night, your phone should be within 15 feet.  What happens if I leave the room for restroom or other reasons?  Leaving the room for any  reason will not cause any problem. As soon as your get back to the room, both devices will reconnect and will continue to send the sleep data. Can I use my phone during the sleep study?  Yes, you can use your phone as usual during the study. But it is recommended to put your watchpat one on when you are ready to go to bed.  How will I get my study results?  A soon as you completed your study, your sleep data will be sent to the provider. They will then share the results with you when they are ready.

## 2023-08-06 NOTE — Progress Notes (Signed)
 Cardiology Office Note:  .   Date:  08/06/2023  ID:  Jasmine Lopez, DOB 1940-08-24, MRN [id number] PCP: Jasmine Felts, FNP  Jasmine Lopez Cardiologist:  Jasmine Si, MD    History of Present Illness: .   Jasmine Lopez is a 83 y.o. female  with hypertension, breast cancer, and diabetes here for follow up.  She was initially seen 10/2017 for heart murmur.  Jasmine Lopez recently moved to [place] from [place].  Her husband passed 02/2017 then her mother passed 06/2017.  She moved to be closer to her sons.  She established care with her PCP and was noted to have a murmur.  Jasmine Lopez was seeing a cardiologist in [place] due to poorly controlled hypertension.  She had a stress test around 2017 that was reportedly negative for ischemia.  She was started on metoprolol which helped her to feel less short of breath.  She was referred for an echocardiogram 11/17/2017 that revealed LVEF 65 to 70% and no LVH or intracavitary gradient.  She had no valvular heart disease. Her BP was above goal so amlodipine was added.  She followed up with Jasmine Lopez and her BP was still elevated.  She had a hard time cutting chlorthalidone in half so it was discontinued.  Metoprolol was switched to carvedilol due to bradycardia.  Her BP was a little elevated 02/2019, but this was 2/2 steroid use and she was only taking carvedilol once daily.  She struggled with cough and has been diagnosed with vocal cord dysfunction.  She gets rehab and has GERD that is being treated.  This has helped her cough.  She has noted significant improvement in the symptoms.  She occasionally has heartburn symptoms that occur when she eats a heavy meal and lays down.  Otherwise it is well-controlled.  She has no heartburn with exertion.   Jasmine Lopez's blood pressure was poorly controlled.  Amlodipine was increased to 10 mg.  Blood pressure was 140/68 when she followed up with our pharmacist.  Losartan was switched to valsartan.   Since then her blood pressure has been well controlled.  She was also referred to the PREP program at the [facility].  She wants to participate but feels uncomfortable driving around town.  She had some word finding difficulty and was concerned about Alzheimer's.  She was referred to neurology and had an MRI of the brain that was unremarkable, showing only mild age-appropriate changes of chronic small vessel disease and supratentorial cortical atrophy.  She was started back on Wellbutrin and feels that her symptoms have improved significantly.  At her last appointment she was making some improvements with the help of her health coach.  Her blood pressure was slightly above goal but she wanted to keep working on diet and exercise instead of adding any new medications.  She complained of hair loss and her TSH was normal.   She continued to work on her diet and exercise. She enjoyed the PREP program. Home blood pressures were well controlled in the 110-130s systolic. She called the office reporting syncope and collapse on 11/01/21 resulting in 2 black eyes. She had been evaluated by neurology 11/06/21 and was recommended for CT head and EEG as she reported a sweet smell prior to her syncopal episode. CT head 11/08/2027 with mild generalized cortical atrophy which is likely typical for age, few foci of hypoattenuation in hemispheres consistent with mild chronic microvascular ischemic change typical for age, and no acute findings. She  was seen in follow-up by Jasmine Shields, NP 11/13/2021 and it was felt her syncope was due to dehydration. A 14-day Zio was placed in clinic and was notable for 58 episodes of SVT versus atrial tachycardia, longest 30 seconds. Recommended to increase Coreg to 1.5 tablets BID. She had an echo 11/2021 revealing LVEF 58%, mild LVH, and normal diastolic parameters. Carotid dopplers 11/2021 showed mild stenosis of the right ICA. We recommended adding aspirin EC 81 mg daily.  At her visit 08/2022 she  reported a couple episodes of blood pressures in the 110s over 60s which concerned her.  She noted that she felt drowsy when this occurred.  Most blood pressures were in the 120s to 130s.  She saw Jasmine Shields, NP 01/2023 and was doing well on Ozempic.  Discussed the use of AI scribe software for clinical note transcription with the patient, who gave verbal consent to proceed.  History of Present Illness Jasmine Lopez notes that her hypertension is well-controlled with recent blood pressure readings around 130-135/60-70 mmHg. She is currently taking amlodipine, carvedilol, and valsartan/hydrochlorothiazide. She took her medication earlier than usual on the day of the visit to ensure good blood pressure control.  She is managing elevated blood sugar levels, with a recent increase in HbA1c to 8% after the holidays, previously around 7%. She started Rybelsus two weeks ago after finding Ozempic too expensive.  No chest pain or pressure, but she notes discomfort when bending down for extended periods. No other cardiac symptoms are present.  She has been referred to a local [employer] for exercise, which she found beneficial, but has struggled to maintain a consistent routine. She attends yoga classes weekly and occasionally walks at the center across from her home. She acknowledges the need to increase her physical activity.  She experiences snoring and occasional breathing difficulties at night but has not completed a sleep study. She recalls a fainting episode related to heat exposure, which led to a neurologist consultation, but no physical cause was identified.  She is taking allopurinol for gout and minoxidil for hair thinning, which she attributes to hereditary factors. She is concerned about taking multiple medications at once but reports no adverse symptoms.  ROS:  As per HPI  Studies Reviewed: .       Echo 11/2021: 1. Left ventricular ejection fraction by 3D volume is 58 %. The left  ventricle has  normal function. The left ventricle has no regional wall  motion abnormalities. There is mild concentric left ventricular  hypertrophy. Left ventricular diastolic  parameters were normal.   2. Right ventricular systolic function is normal. The right ventricular  size is normal.   3. The mitral valve is normal in structure. No evidence of mitral valve  regurgitation. No evidence of mitral stenosis.   4. The aortic valve is normal in structure. Aortic valve regurgitation is  not visualized. No aortic stenosis is present.   5. The inferior vena cava is normal in size with greater than 50%  respiratory variability, suggesting right atrial pressure of 3 mmHg.    Risk Assessment/Calculations:             Physical Exam:   VS:  BP 112/70   Pulse 65   Ht 5\' 4"  (1.626 m)   Wt 184 lb 6.4 oz (83.6 kg)   SpO2 97%   BMI 31.65 kg/m  , BMI Body mass index is 31.65 kg/m. GENERAL:  Well appearing HEENT: Pupils equal round and reactive, fundi not visualized, oral  mucosa unremarkable NECK:  No jugular venous distention, waveform within normal limits, carotid upstroke brisk and symmetric, no bruits, no thyromegaly LUNGS:  Clear to auscultation bilaterally HEART:  RRR.  PMI not displaced or sustained,S1 and S2 within normal limits, no S3, no S4, no clicks, no rubs, no murmurs ABD:  Flat, positive bowel sounds normal in frequency in pitch, no bruits, no rebound, no guarding, no midline pulsatile mass, no hepatomegaly, no splenomegaly EXT:  2 plus pulses throughout, no edema, no cyanosis no clubbing SKIN:  No rashes no nodules NEURO:  Cranial nerves II through XII grossly intact, motor grossly intact throughout PSYCH:  Cognitively intact, oriented to person place and time   ASSESSMENT AND PLAN: .    Assessment & Plan # Hypertension Hypertension well-controlled with current medications. Home BP 130-135/60-70 mmHg. - Continue amlodipine, carvedilol, valsartan/hydrochlorothiazide.  # Type 2  Diabetes Mellitus HbA1c increased to 8%. Rybelsus initiated due to insurance coverage issues with Ozempic. Emphasized exercise and diet for blood sugar management. - Continue Rybelsus. - Encourage regular exercise and dietary modifications.  # Hyperlipidemia Cholesterol levels well-controlled.  # Obstructive Sleep Apnea (suspected) Snoring and apnea suggest possible obstructive sleep apnea. Home sleep study recommended. - Arrange home sleep study.   Dispo: f/u 1 year  Signed, Jasmine Si, MD

## 2023-08-10 ENCOUNTER — Telehealth: Payer: Self-pay

## 2023-08-10 NOTE — Telephone Encounter (Signed)
**Note De-Identified Sims Laday Obfuscation** Ordering provider: Dr Duke Salvia Associated diagnoses: Snoring-R06.83 and Somnolence-R40.0  WatchPAT PA obtained on 08/10/2023 by Jany Buckwalter, Lorelle Formosa, LPN. Authorization: Per the [employer] Provider Portal: Information about your requested care Prior authorization is not required for this code: [id number]-HST Sleep study, unattended, simultaneous recording; heart rate, oxygen saturation, respiratory analysis (eg, by airflow or peripheral arterial tone), and sleep time  Patient notified of PIN ([id number]) on 08/10/2023 Jasmine Lopez Notification Method: phone-no answer so I left a message on the pts VM advising her of the Pin # for the WatchPAT One-HST device that Dr Duke Salvia ordered at her 03/27 office visit with her. I also advised her in the message that I am sending her a [facility] message with the [facility] One-HST Device Pin # as well.  Phone note routed to covering staff for follow-up.

## 2023-08-11 ENCOUNTER — Ambulatory Visit: Payer: Medicare PPO | Admitting: Nurse Practitioner

## 2023-08-28 NOTE — Telephone Encounter (Signed)
 Device returned to clinic today, but device has been opened.

## 2023-08-28 NOTE — Telephone Encounter (Signed)
 Jasmine Lopez not completed or returned, routing to billing

## 2023-09-15 NOTE — Progress Notes (Addendum)
 [place]avia, CMA,acting as a Neurosurgeon for [name], FNP.,have documented all relevant documentation on the behalf of [name], FNP,as directed by  [name], FNP while in the presence of [name], FNP.  Subjective:  Patient ID: Jasmine Lopez , female    DOB: 1940/09/23 , 83 y.o.   MRN: [id number]  Chief Complaint  Patient presents with   Hypertension    Patient presents today for a bp check. Patient reports compliance with her meds. Patient denies having sob,chest pain or headaches at this time.    Diabetes   Mass    Patient complains of a bump on the side of nose.     HPI  She is taking Rybelsus  daily but not always before breakfast. She has been doing well. She did pay $300 the first time and is now paying $30.   Diabetes She presents for her follow-up diabetic visit. She has type 2 diabetes mellitus. Her disease course has been stable. Pertinent negatives for hypoglycemia include no dizziness. Pertinent negatives for diabetes include no fatigue, no polydipsia, no polyphagia and no polyuria. There are no hypoglycemic complications. Diabetic complications include heart disease (dyslipidemia). Risk factors for coronary artery disease include obesity, hypertension and dyslipidemia. Current diabetic treatment includes oral agent (monotherapy). She is compliant with treatment most of the time. Her weight is increasing steadily. She is following a generally healthy diet. When asked about meal planning, she reported none. She has not had a previous visit with a dietitian. She participates in exercise intermittently. (Blood sugar was 180 once and had a blood sugar of 150. ) An ACE inhibitor/angiotensin II receptor blocker is being taken. She does not see a podiatrist.Eye exam is current.     Past Medical History:  Diagnosis Date   Anxiety    Breast cancer (HCC)    Cancer (HCC)    Class 1 obesity due to excess calories with body mass index (BMI) of 34.0 to 34.9 in adult  06/14/2023   Class 1 obesity due to excess calories with serious comorbidity and body mass index (BMI) of 33.0 to 33.9 in adult 04/22/2023   Diabetes mellitus without complication (HCC)    Dysphagia    Fall 10/20/2022   GERD (gastroesophageal reflux disease) 10/12/2020   Hair loss 10/12/2020   Heart murmur    Hoarseness of voice 07/15/2018   Hx of adenomatous polyp of colon 06/2015   Dr. Froylan [facility]   Hypertension    Irregular heart beat    Sleep apnea    SVT (supraventricular tachycardia) (HCC) 08/11/2022     Family History  Problem Relation Age of Onset   Heart failure Mother        CAD   COPD Mother    Hypertension Mother    Cancer Father    Rectal cancer Maternal Grandmother        Unsure   Colon cancer Maternal Grandmother    Heart attack Maternal Grandfather    Esophageal cancer Neg Hx    Stomach cancer Neg Hx    Breast cancer Neg Hx      Current Outpatient Medications:    allopurinol  (ZYLOPRIM ) 100 MG tablet, TAKE 1 TABLET(100 MG) BY MOUTH DAILY, Disp: 90 tablet, Rfl: 1   amLODipine  (NORVASC ) 10 MG tablet, Take 1 tablet (10 mg total) by mouth daily., Disp: 90 tablet, Rfl: 3   Biotin 1000 MCG tablet, Take 1,000 mcg by mouth daily., Disp: , Rfl:    Blood Glucose Monitoring  Suppl DEVI, Use to check glucose twice daily. May substitute to any manufacturer covered by patient's insurance., Disp: 1 each, Rfl: 0   buPROPion  (WELLBUTRIN  XL) 150 MG 24 hr tablet, Take 1 tablet (150 mg total) by mouth daily., Disp: 30 tablet, Rfl: 5   carvedilol  (COREG ) 6.25 MG tablet, Take 1 tablet (6.25 mg total) by mouth 2 (two) times daily with a meal., Disp: 180 tablet, Rfl: 3   cetirizine (ZYRTEC) 10 MG tablet, Take 10 mg by mouth daily., Disp: , Rfl:    Cholecalciferol (VITAMIN D3) 125 MCG (5000 UT) CAPS, Take 1 capsule by mouth daily., Disp: , Rfl:    COD LIVER OIL PO, Take 1 capsule by mouth daily., Disp: , Rfl:    Garlic 1000 MG CAPS, Take 1 capsule by mouth daily.,  Disp: , Rfl:    Glucose Blood (BLOOD GLUCOSE TEST STRIPS) STRP, Use to check glucose twice daily. May substitute to any manufacturer covered by patient's insurance., Disp: 100 strip, Rfl: 3   Lancet Device MISC, Use to check glucose twice daily. May substitute to any manufacturer covered by patient's insurance., Disp: 1 each, Rfl: 0   Lancets Misc. (ACCU-CHEK FASTCLIX LANCET) KIT, USE TO CHECK GLUCOSE TWICE DAILY, Disp: 1 kit, Rfl: 1   metFORMIN  (GLUCOPHAGE -XR) 500 MG 24 hr tablet, Take 1 tablet (500 mg total) by mouth daily., Disp: 90 tablet, Rfl: 1   Misc Natural Products (TURMERIC CURCUMIN) CAPS, Take 1 capsule by mouth daily., Disp: , Rfl:    Multiple Vitamin (MULTIVITAMIN) capsule, Take 1 capsule by mouth daily., Disp: , Rfl:    Semaglutide  (RYBELSUS ) 3 MG TABS, Take 1 tablet (3 mg total) by mouth daily. Take 30 minutes before breakfast, Disp: 30 tablet, Rfl: 1   valsartan -hydrochlorothiazide  (DIOVAN  HCT) 160-12.5 MG tablet, Take 1 tablet by mouth daily., Disp: 90 tablet, Rfl: 3   rosuvastatin  (CRESTOR ) 20 MG tablet, TAKE 1 TABLET(20 MG) BY MOUTH DAILY, Disp: 90 tablet, Rfl: 1  Current Facility-Administered Medications:    0.9 %  sodium chloride  infusion, 500 mL, Intravenous, Once, Armbruster, Lendon Queen, MD   Allergies  Allergen Reactions   Ace Inhibitors Cough   Atorvastatin  Other (See Comments)    Leg pain    Benadryl [Diphenhydramine] Itching    Benadryl cream   Peppermint Flavor [Flavoring Agent (Non-Screening)]     Causes horsenss     Review of Systems  Constitutional:  Negative for fatigue.  Respiratory: Negative.    Cardiovascular: Negative.   Gastrointestinal:  Negative for abdominal pain.  Endocrine: Negative for polydipsia, polyphagia and polyuria.  Neurological:  Negative for dizziness.  Psychiatric/Behavioral: Negative.       Today's Vitals   09/16/23 1100  BP: 110/80  Pulse: (!) 56  Temp: 98.2 F (36.8 C)  TempSrc: Oral  Weight: 186 lb (84.4 kg)  Height: 5'  4" (1.626 m)  PainSc: 0-No pain   Body mass index is 31.93 kg/m.  Wt Readings from Last 3 Encounters:  09/16/23 186 lb (84.4 kg)  08/06/23 184 lb 6.4 oz (83.6 kg)  07/27/23 188 lb (85.3 kg)    Objective:  Physical Exam Vitals and nursing note reviewed.  Constitutional:      General: She is not in acute distress.    Appearance: Normal appearance. She is obese.  Cardiovascular:     Rate and Rhythm: Normal rate and regular rhythm.     Pulses: Normal pulses.     Heart sounds: Normal heart sounds. No murmur heard. Pulmonary:  Effort: Pulmonary effort is normal. No respiratory distress.     Breath sounds: No wheezing.  Skin:    General: Skin is warm.     Capillary Refill: Capillary refill takes less than 2 seconds.  Neurological:     General: No focal deficit present.     Mental Status: She is alert and oriented to person, place, and time.     Cranial Nerves: No cranial nerve deficit.     Motor: No weakness.  Psychiatric:        Mood and Affect: Mood normal.        Behavior: Behavior normal.        Thought Content: Thought content normal.        Judgment: Judgment normal.       Assessment And Plan:  Essential hypertension Assessment & Plan: Blood pressure is controlled, continue current medications and focus on lifestyle modifications   Type 2 diabetes mellitus with hyperlipidemia (HCC) Assessment & Plan: Hemoglobin A1c improved at last visit, continue Trulicity.  Will recheck A1c.    Mixed hyperlipidemia Assessment & Plan: LDL is stable.  she is advised to continue taking statin.  Will check lipid panel   Class 1 obesity due to excess calories with body mass index (BMI) of 31.0 to 31.9 in adult, unspecified whether serious comorbidity present Assessment & Plan: She is encouraged to strive for BMI less than 30 to decrease cardiac risk. Advised to aim for at least 150 minutes of exercise per week.      Return for move july appt to Aug .  Patient was given  opportunity to ask questions. Patient verbalized understanding of the plan and was able to repeat key elements of the plan. All questions were answered to their satisfaction.    Inge Mangle, FNP, have reviewed all documentation for this visit. The documentation on 09/30/23 for the exam, diagnosis, procedures, and orders are all accurate and complete.   IF YOU HAVE BEEN REFERRED TO A SPECIALIST, IT MAY TAKE 1-2 WEEKS TO SCHEDULE/PROCESS THE REFERRAL. IF YOU HAVE NOT HEARD FROM US /SPECIALIST IN TWO WEEKS, PLEASE GIVE US  A CALL AT [phone].

## 2023-09-16 ENCOUNTER — Encounter: Payer: Self-pay | Admitting: Nurse Practitioner

## 2023-09-16 ENCOUNTER — Telehealth: Payer: Self-pay | Admitting: Cardiovascular Disease

## 2023-09-16 ENCOUNTER — Ambulatory Visit (INDEPENDENT_AMBULATORY_CARE_PROVIDER_SITE_OTHER): Payer: Self-pay | Admitting: Nurse Practitioner

## 2023-09-16 VITALS — BP 110/80 | HR 56 | Temp 98.2°F | Ht 64.0 in | Wt 186.0 lb

## 2023-09-16 DIAGNOSIS — E785 Hyperlipidemia, unspecified: Secondary | ICD-10-CM

## 2023-09-16 DIAGNOSIS — Z6831 Body mass index (BMI) 31.0-31.9, adult: Secondary | ICD-10-CM | POA: Diagnosis not present

## 2023-09-16 DIAGNOSIS — E1169 Type 2 diabetes mellitus with other specified complication: Secondary | ICD-10-CM

## 2023-09-16 DIAGNOSIS — E6609 Other obesity due to excess calories: Secondary | ICD-10-CM

## 2023-09-16 DIAGNOSIS — I10 Essential (primary) hypertension: Secondary | ICD-10-CM | POA: Diagnosis not present

## 2023-09-16 DIAGNOSIS — E66811 Obesity, class 1: Secondary | ICD-10-CM | POA: Diagnosis not present

## 2023-09-16 DIAGNOSIS — E782 Mixed hyperlipidemia: Secondary | ICD-10-CM | POA: Diagnosis not present

## 2023-09-16 NOTE — Telephone Encounter (Signed)
 Will forward this message to our sleep study pool for further management and follow-up with the pt.

## 2023-09-16 NOTE — Telephone Encounter (Signed)
 Pt requesting cb regarding sleep study test that was returned and billed $100

## 2023-09-16 NOTE — Patient Instructions (Signed)

## 2023-09-22 NOTE — Telephone Encounter (Signed)
 Spoke with patient. Discussed [employer] Patient Agreement and $100 charge for opened devices. Explained that we are unable to reuse/repurpose opened devices, so they have to be disposed of when returned. Pt verbalizes understanding. She is still interested in having a sleep study, but feels that the [facility] device is too complicated. She states her son is too far away to help her with the device.   She is willing to undergo an in-lab sleep study if this is Dr. Gerlene Koh recommendation. Advised that this message will be forwarded to Dr. Theodis Fiscal for advisement. Pt verbalizes understanding and agreement with plan.

## 2023-09-25 NOTE — Telephone Encounter (Signed)
 Addressed via MyChart message on 09/22/23.

## 2023-09-27 ENCOUNTER — Encounter: Payer: Self-pay | Admitting: Nurse Practitioner

## 2023-09-27 NOTE — Assessment & Plan Note (Signed)
 Blood pressure is controlled, continue current medications and focus on lifestyle modifications

## 2023-09-27 NOTE — Assessment & Plan Note (Signed)
 Hemoglobin A1c improved at last visit, continue Trulicity.  Will recheck A1c.

## 2023-09-27 NOTE — Assessment & Plan Note (Signed)
 She is encouraged to strive for BMI less than 30 to decrease cardiac risk. Advised to aim for at least 150 minutes of exercise per week.

## 2023-09-27 NOTE — Assessment & Plan Note (Signed)
 LDL is stable.  she is advised to continue taking statin.  Will check lipid panel

## 2023-09-30 ENCOUNTER — Other Ambulatory Visit: Payer: Self-pay

## 2023-09-30 MED ORDER — ROSUVASTATIN CALCIUM 20 MG PO TABS: ORAL_TABLET | ORAL | 1 refills | Status: DC

## 2023-09-30 NOTE — Progress Notes (Signed)
 Done

## 2023-10-01 ENCOUNTER — Other Ambulatory Visit: Payer: Self-pay

## 2023-10-01 ENCOUNTER — Encounter: Payer: Self-pay | Admitting: Dietician

## 2023-10-01 ENCOUNTER — Encounter: Attending: Nurse Practitioner | Admitting: Dietician

## 2023-10-01 DIAGNOSIS — E1169 Type 2 diabetes mellitus with other specified complication: Secondary | ICD-10-CM | POA: Insufficient documentation

## 2023-10-01 DIAGNOSIS — E785 Hyperlipidemia, unspecified: Secondary | ICD-10-CM | POA: Insufficient documentation

## 2023-10-01 MED ORDER — ROSUVASTATIN CALCIUM 20 MG PO TABS: ORAL_TABLET | ORAL | 1 refills | Status: DC

## 2023-10-01 NOTE — Patient Instructions (Addendum)
 Continue to stay active  Walk  Classes  Weights - maintain muscle mass Add a high protein snack in the middle of the day as you are only eating 2 meals per day and need additional nutrition  Cottage cheese and fruit  Hummus, vegetables, whole grain crackers  [place] yogurt with fresh fruit  1 oz cheese and fresh fruit or whole grain crackers  Wrap with a low carb tortilla (or Joseph's pita), hummus, greens and fresh fruit  Small portion chicken or tuna salad with whole grain crackers or 1 slice of bread  Bowl of homemade soup  Aim for 2-3 Carb Choices per meal (30-45 grams) +/- 1 either way  Aim for 0-1 Carbs per snack if hungry  Include protein in moderation with your meals and snacks Consider reading food labels for Total Carbohydrate of foods Consider  increasing your activity level by walking or class for 30 or more minutes daily as tolerated Consider checking BG at alternate times per day  Consider taking medication as directed by MD

## 2023-10-01 NOTE — Progress Notes (Signed)
 Medical Nutrition Therapy  Appointment Start time:  [phone]  Appointment End time:  0945 Patient is here today alone.  She was last seen by this RD on 07/27/2023.  Primary concerns today: Patient states that she has gone through a lot in life and wants to get back on track.  She will also go to counseling.  Her husband passed about 5 years ago and her mother a short time later.  She completed a program at the [facility] 2 years ago that helped her lose weight and control her blood sugar.  She is now going to the senior center, taking classes such as yoga and walking. Fasting blood glucose 130-140 lowered from 150 at the previous visit.  Referral diagnosis: Type 2 Diabetes, HLD Preferred learning style: no preference indicated Learning readiness:  ready, change in progress   NUTRITION ASSESSMENT  63.5" 191 lbs 10/01/2023 188 lbs 07/27/2023 192 lbs 07/15/2023  Clinical Medical Hx: Type 2 Diabetes (2020), HLD, HTN, OSA (no c-pap), GERD Medications: Metformin , Rybelsus   Labs: A1c 9.6% 07/15/2023 increased from 7.5% 04/16/2023, Vitamin B-12 670 in 2021, Vitamin D 54 in 2021, eGFR 65 07/15/2023 Lipid Panel     Component Value Date/Time   CHOL 134 07/15/2023 1137   TRIG 56 07/15/2023 1137   HDL 56 07/15/2023 1137   CHOLHDL 2.4 07/15/2023 1137   LDLCALC 66 07/15/2023 1137   LABVLDL 12 07/15/2023 1137   Notable Signs/Symptoms: none  Lifestyle & Dietary Hx Patient lives alone.  She moved from [place] and lives in the home she grew up in.  She has family locally.  She eats out about 3 times per week. She is retired Teacher, early years/pre. Doesn't tolerate increased amounts of dairy as this causes more sinus issues and coughing. Lower carb and lower sodium  Estimated daily fluid intake: 70 oz Supplements: Turmeric curcumin, garlic, MVI, biotin, vitamin D, Cod liver oil Sleep: good 6-7 hours and wakes 2-3 times per night Stress / self-care: moderate Current average weekly physical activity: She is  now going to the senior center for classes 2 times per week.  Joined the [facility] and plans to go 2 days per week.  She walks occasionally.  24-Hr Dietary Recall First Meal 10:30 am: 2 eggs, 2 strips bacon, 1/2 slice bread (plain) Snack: none Second Meal:  Snack: none Third Meal 6 pm: curry (beans, vegetables) fish, asparagus Snack: occasional popcorn Beverages: water, coffee with stevia and half and half, unsweetened green tea, rare diet soda  NUTRITION DIAGNOSIS  NB-1.1 Food and nutrition-related knowledge deficit As related to balance of carbohydrates, protein, and fat.  As evidenced by diet hx and patient report.   NUTRITION INTERVENTION  Nutrition education (E-1) on the following topics:  Counseling and diabetes education continued. Discussed basic meal planning and ideas to increase the nutrition in her diet Discussed benefits of exercise - consistency, weight bearing Discussed tips to maintain adequate water intake  Handouts Provided Include - initial visit How to Thrive:  A Guide for Your Journey with Diabetes by the ADA Meal Plan Card My Plate Snack list Diabetes Resources [employer] developer of Lifestyle Medicine) packet  Learning Style & Readiness for Change Teaching method utilized: Visual & Auditory  Demonstrated degree of understanding via: Teach Back  Barriers to learning/adherence to lifestyle change: none  Goals Established by Pt Plan:  Continue to stay active  Walk  Classes  Weights - maintain muscle mass Add a high protein snack in the middle of the day as you  are only eating 2 meals per day and need additional nutrition  Cottage cheese and fruit  Hummus, vegetables, whole grain crackers  [place] yogurt with fresh fruit  1 oz cheese and fresh fruit or whole grain crackers  Wrap with a low carb tortilla (or Joseph's pita), hummus, greens and fresh fruit  Small portion chicken or tuna salad with whole grain crackers or 1 slice of bread  Bowl of homemade  soup  Aim for 2-3 Carb Choices per meal (30-45 grams) +/- 1 either way  Aim for 0-1 Carbs per snack if hungry  Include protein in moderation with your meals and snacks Consider reading food labels for Total Carbohydrate of foods Consider  increasing your activity level by walking or class for 30 or more minutes daily as tolerated Consider checking BG at alternate times per day  Consider taking medication as directed by MD  MONITORING & EVALUATION Dietary intake, weekly physical activity, and label reading in 3 months.  Next Steps  Patient is to call for questions.

## 2023-10-13 ENCOUNTER — Other Ambulatory Visit: Payer: Self-pay | Admitting: Nurse Practitioner

## 2023-10-13 DIAGNOSIS — E1169 Type 2 diabetes mellitus with other specified complication: Secondary | ICD-10-CM

## 2023-10-28 ENCOUNTER — Telehealth: Payer: Self-pay

## 2023-10-28 NOTE — Telephone Encounter (Signed)
 Patient was identified as falling into the [place] Measure - Diabetes.   Patient was: Appointment already scheduled for:  12/16/23.

## 2023-11-10 ENCOUNTER — Encounter: Payer: Self-pay | Admitting: Podiatry

## 2023-11-10 ENCOUNTER — Ambulatory Visit: Admitting: Podiatry

## 2023-11-10 DIAGNOSIS — E1169 Type 2 diabetes mellitus with other specified complication: Secondary | ICD-10-CM

## 2023-11-10 DIAGNOSIS — E785 Hyperlipidemia, unspecified: Secondary | ICD-10-CM

## 2023-11-10 DIAGNOSIS — M79675 Pain in left toe(s): Secondary | ICD-10-CM | POA: Diagnosis not present

## 2023-11-10 DIAGNOSIS — B351 Tinea unguium: Secondary | ICD-10-CM | POA: Diagnosis not present

## 2023-11-10 DIAGNOSIS — M79674 Pain in right toe(s): Secondary | ICD-10-CM | POA: Diagnosis not present

## 2023-11-10 NOTE — Patient Instructions (Signed)
 How to Use Compression Stockings  Compression stockings are elastic socks that help increase blood flow (circulation) to the legs, decrease swelling in the legs, and reduce the chance of developing blood clots in the lower legs. Compression stockings squeeze or apply pressure to the legs. The stockings are graduated, meaning the highest amount of pressure occurs at the toes and it decreases going toward the upper part of the leg. This helps ensure proper circulation through the veins. Compression stockings are often used by people who: Are recovering from surgery. The stockings help prevent blood clots after surgery. Have poor circulation or swelling in their legs because of a medical condition, such as chronic venous insufficiency, venous stasis, or lymphedema. Have a history of getting blood clots in their legs. Have bulging (varicose) veins. Sit or stay in bed for long periods of time (immobilization). Stand for long periods of time and experience leg pain or fatigue. Follow instructions from your health care provider about how and when to wear your compression stockings. What are the risks? Generally, compression stockings are safe to wear. However, problems may occur for some people, such as: The stockings being ineffective at increasing the circulation to the legs, decreasing swelling in the legs, or reducing the chance of developing blood clots in the lower legs. Skin complications, including breaks in the skin, open wounds, blisters, or dermatitis. How to wear compression stockings Before you put on your compression stockings: Make sure that they are the correct size and degree of compression. If you do not know your size or required grade of compression, ask your health care provider and follow the manufacturer's instructions that come with the stockings. Be sure they are the appropriate length for your medical needs. Compression stockings come in different lengths, including knee high,  thigh high, and even up to the waist. Make sure that the stockings are clean, dry, and in good condition. Check the stockings for rips and tears. Do not put them on if they are ripped or torn. Put your stockings on first thing in the morning, before you get out of bed. Keep them on for as long as your health care provider advises. Most people are told to remove their compression stockings at the end of the day before bed. When you are wearing your stockings: Keep them as smooth as possible. Do not allow them to bunch up. It is especially important to prevent the stockings from bunching up around your toes or behind your knees. Make sure that the toe holes are underneath the toes and the heel patches are positioned at the heels. Do not roll the stockings downward and leave them rolled down. This can decrease blood flow to your legs. Change the stockings right away if they become wet or dirty or if they have a bad smell. If you have chronic leg wounds, make sure the wounds are properly covered or dressed before putting on your compression stockings. When you take off your stockings, check your legs and feet for: Open sores. Red spots or other areas of discoloration. Swelling. General tips Do not stop wearing compression stockings. Talk to your health care provider if your stockings feel too tight. Wash your stockings often with mild detergent in cold or warm water. Also wash them whenever they get dirty or have a bad smell. Do not use bleach. Air-dry your stockings or dry them in a clothes dryer on low heat. It may be helpful to have two pairs so that you have a pair to  wear while the other is being washed. Replace your stockings every 3-6 months. If skin moisturizing is part of your treatment plan, apply lotion or cream at night so that your skin will be dry when you put on the stockings in the morning. It is harder to put the stockings on when you have lotion on your legs or feet. Wear nonskid  shoes or slip-resistant socks when walking while wearing compression stockings. If you have difficulty putting on or taking off the compression stockings, ask your health care provider about devices that may help make this easier. Contact a health care provider and remove your stockings if: You have a prickling or tingling feeling in your feet or legs. You have new open sores, red spots, or other skin changes on your feet or legs. You have swelling or pain that gets worse. Get help right away if: You have shortness of breath or chest pain. Your heartbeat is fast or irregular. You have new swelling, pain, or warmth in your leg. You have numbness or tingling in your lower legs that does not get better after you take the stockings off. Your toes or feet are unusually cold or turn a bluish color. You feel light-headed or dizzy. These symptoms may represent a serious problem that is an emergency. Do not wait to see if the symptoms will go away. Get medical help right away. Call your local emergency services (911 in the [place] Do not drive yourself to the hospital. Summary Compression stockings are elastic socks that are worn to treat a variety of symptoms and medical conditions such as venous insufficiency, venous stasis, or lymphedema. Compression stockings help increase blood flow (circulation) to the legs, decrease swelling in the legs, and reduce the chance of developing blood clots in the lower legs. Follow instructions from your health care provider about how and when to wear your compression stockings. Do not stop wearing your compression stockings without talking to your health care provider first. This information is not intended to replace advice given to you by your health care provider. Make sure you discuss any questions you have with your health care provider. Document Revised: 10/18/2020 Document Reviewed: 10/18/2020 Elsevier Patient Education  2024 [employer]  Varicose  Veins Varicose veins are veins that have become enlarged, bulged, and twisted. They most often appear in the legs. What are the causes? This condition is caused by damage to the valves in the vein. These valves help blood return to your heart. When they are damaged and they stop working properly, blood may flow backward and back up in the veins near the skin, causing the veins to get larger and appear twisted. The condition can result from any issue that causes blood to back up, like pregnancy, prolonged standing, or obesity. What increases the risk? The following factors may make you more likely to develop this condition: Being on your feet a lot. Being pregnant. Being overweight. Smoking. Having had a previous deep vein thrombosis or having a thrombotic disorder. Aging. The risk increases with age. Having a condition called Klippel-Trenaunay syndrome. What are the signs or symptoms? Symptoms of this condition include: Bulging, twisted, and bluish veins. A feeling of heaviness in your legs. This may be worse at the end of the day. Leg pain. This may be worse at the end of the day. Swelling in the leg. Changes in skin color over the veins. Swelling or pain in the legs can limit your activities. Your symptoms may get worse when you  sit or stand for long periods of time. How is this diagnosed? This condition may be diagnosed based on: Your symptoms, family history, activity levels, and lifestyle. A physical exam. You may also have tests, including an ultrasound or X-ray. How is this treated? Treatment for this condition may involve: Avoiding sitting or standing in one position for long periods of time. Wearing compression stockings. These stockings help to prevent blood clots and reduce swelling in the legs. Raising (elevating) the legs when resting. Losing weight. Exercising regularly. If you have persistent symptoms or want to improve the way your varicose veins look, you may choose  to have a procedure to close the varicose veins off or to remove them. Nonsurgical treatments to close off the veins include: Sclerotherapy. In this treatment, a solution is injected into a vein to close it off. Laser treatment. The vein is heated with a laser to close it off. Radiofrequency vein ablation. An electrical current produced by radio waves is used to close off the vein. Surgical treatments to remove the veins include: Phlebectomy. In this procedure, the veins are removed through small incisions made over the veins. Vein ligation and stripping. In this procedure, incisions are made over the veins. The veins are then removed after being tied (ligated) with stitches (sutures). Follow these instructions at home: Medicines Take over-the-counter and prescription medicines only as told by your health care provider. If you were prescribed an antibiotic medicine, use it as told by your health care provider. Do not stop using the antibiotic even if you start to feel better. Activity Walk as much as possible. Walking increases blood flow. This helps blood return to the heart and takes pressure off your veins. Do not stand or sit in one position for a long period of time. Do not sit with your legs crossed. Avoid sitting for a long time without moving. Get up to take short walks every 1-2 hours. This is important to improve blood flow and breathing. Ask for help if you feel weak or unsteady. Return to your normal activities as told by your health care provider. Ask your health care provider what activities are safe for you. Do exercises as told by your health care provider. General instructions  Follow any diet instructions given to you by your health care provider. Elevate your legs at night to above the level of your heart. If you get a cut in the skin over the varicose vein and the vein bleeds: Lie down with your leg raised. Apply firm pressure to the cut with a clean cloth until the  bleeding stops. Place a bandage (dressing) on the cut. Drink enough fluid to keep your urine pale yellow. Do not use any products that contain nicotine or tobacco. These products include cigarettes, chewing tobacco, and vaping devices, such as e-cigarettes. If you need help quitting, ask your health care provider. Wear compression stockings as told by your health care provider. Do not wear other kinds of tight clothing around your legs, pelvis, or waist. Keep all follow-up visits. This is important. Contact a health care provider if: The skin around your varicose veins starts to break down. You have more pain, redness, tenderness, or hard swelling over a vein. You are uncomfortable because of pain. You get a cut in the skin over a varicose vein and it will not stop bleeding. Get help right away if: You have chest pain. You have trouble breathing. You have severe leg pain. Summary Varicose veins are veins that have become  enlarged, bulged, and twisted. They most often appear in the legs. This condition is caused by damage to the valves in the vein. These valves help blood return to your heart. Treatment for this condition includes frequent movements, wearing compression stockings, losing weight, and exercising regularly. In some cases, procedures are done to close off or remove the veins. Nonsurgical treatments to close off the veins include sclerotherapy, laser therapy, and radiofrequency vein ablation. This information is not intended to replace advice given to you by your health care provider. Make sure you discuss any questions you have with your health care provider. Document Revised: 10/10/2020 Document Reviewed: 10/10/2020 Elsevier Patient Education  2024 [employer] Chronic Venous Insufficiency Chronic venous insufficiency is a condition that causes the veins in the legs to struggle to pump blood from the legs to the heart. It is also called venous stasis. This condition can happen  when the vein walls are stretched, weakened, or damaged. It can also happen when the valves inside the vein are damaged. With the right treatment, you should be able to still lead an active life. What are the causes? Common causes of this condition include: Venous hypertension. This is high blood pressure inside the veins. Sitting or standing too long. This can cause increased blood pressure in the veins of the leg. Deep vein thrombosis (DVT). This is a blood clot that blocks blood flow in a vein. Phlebitis. This is inflammation of a vein. It can cause a blood clot to form. An abnormal growth of cells (tumor) in the area between your hip bones (pelvis). This can cause blood to back up. What increases the risk? Factors that may make you more likely to get this condition include: Having a family history of the condition. Being overweight. Being pregnant. Not getting enough exercise. Smoking. Having a job that requires you to sit or stand in one place for a long time. Being a certain age. Females in their 57s and 69s and males in their 61s are more likely to get this condition. What are the signs or symptoms? Symptoms of this condition include: Varicose veins. These are veins that are enlarged, bulging, or twisted. Skin breakdown or ulcers. Reddened skin or dark discoloration of the skin on the leg between the knee and ankle. Lipodermatosclerosis. This is brown, smooth, tight, and painful skin just above the ankle. It is often on the inside of the leg. Swelling of the legs. How is this diagnosed? This condition may be diagnosed based on your medical history and a physical exam. You may also need tests, such as: A duplex ultrasound. This shows how blood flows through a blood vessel. Plethysmography. This tests blood flow. Venogram. This looks at the veins using an X-ray and dye. How is this treated? The goals of treatment are to help you return to an active life and to relieve pain.  Treatment may include: Wearing compression stockings. These do not cure the condition but can help relieve symptoms. They can also help stop your condition from getting worse. Sclerotherapy. This involves injecting a solution to shrink damaged veins. Surgery. This may include: Vein stripping. This is when a diseased vein is taken out. Laser ablation surgery. This is when blood flow is cut off through the vein. Repairing or remaking a valve inside the affected vein. Follow these instructions at home: Lifestyle Do not use any products that contain nicotine or tobacco. These products include cigarettes, chewing tobacco, and vaping devices, such as e-cigarettes. If you need help quitting,  ask your health care provider. Stay active. Exercise, walk, or do other activities. Ask your provider what activities are safe for you. General instructions Take over-the-counter and prescription medicines only as told by your provider. Drink enough fluid to keep your pee (urine) pale yellow. Wear compression stockings as told by your provider. These stockings help to prevent blood clots and reduce swelling in your legs. Keep all follow-up visits. Your provider will check your legs for any changes and adjust your treatment plan as needed. Contact a health care provider if: You have redness, swelling, or more pain in the affected area. You see a red streak or line that goes up or down from the area. You have skin breakdown or skin loss. You get an injury in the affected area. You get a fever. Get help right away if: You have severe pain that does not get better with medicine. You get an injury and an open wound in the affected area. Your foot or ankle becomes numb or weak all of a sudden. You have trouble moving your foot or ankle. Your symptoms do not go away or get worse. You have chest pain. You have shortness of breath. These symptoms may be an emergency. Get help right away. Call 911. Do not wait to  see if the symptoms will go away. Do not drive yourself to the hospital. This information is not intended to replace advice given to you by your health care provider. Make sure you discuss any questions you have with your health care provider. Document Revised: 05/13/2022 Document Reviewed: 05/13/2022 Elsevier Patient Education  2024 [employer].

## 2023-11-12 NOTE — Progress Notes (Signed)
  Subjective:  Patient ID: Jasmine Lopez, female    DOB: February 07, 1941,  MRN: [id number]  Jasmine Lopez presents to clinic today for preventative diabetic foot care and painful thick toenails that are difficult to trim. Pain interferes with ambulation. Aggravating factors include wearing enclosed shoe gear. Pain is relieved with periodic professional debridement.  Chief Complaint  Patient presents with   Diabetes    My toenails are ingrown.  Saw [name], FNP - 09/16/2023; A1c - 9.6   New problem(s): None.   PCP is Ada Gaines, FNP.  Allergies  Allergen Reactions   Ace Inhibitors Cough   Atorvastatin  Other (See Comments)    Leg pain    Benadryl [Diphenhydramine] Itching    Benadryl cream   Peppermint Flavor [Flavoring Agent (Non-Screening)]     Causes horsenss    Review of Systems: Negative except as noted in the HPI.  Objective: No changes noted in today's physical examination. There were no vitals filed for this visit. Jasmine Lopez is a pleasant 83 y.o. female in NAD. AAO x 3.  Vascular Examination: Capillary refill time immediate b/l. Palpable pedal pulses. Pedal hair present b/l. No pain with calf compression b/l. Skin temperature gradient WNL b/l. No cyanosis or clubbing b/l. No ischemia or gangrene noted b/l. Trace edema noted BLE.  Neurological Examination: Sensation grossly intact b/l with 10 gram monofilament. Vibratory sensation intact b/l.   Dermatological Examination: Pedal skin with normal turgor, texture and tone b/l.  No open wounds. No interdigital macerations.   Toenails 1-5 b/l thick, discolored, elongated with subungual debris and pain on dorsal palpation.   No hyperkeratotic nor porokeratotic lesions present on today's visit.  Musculoskeletal Examination: Normal muscle strength 5/5 to all lower extremity muscle groups bilaterally. No pain, crepitus or joint limitation noted with ROM b/l LE. No gross bony pedal deformities b/l. Patient  ambulates independently without assistive aids.  Radiographs: None  Last A1c:      Latest Ref Rng & Units 07/15/2023   11:37 AM 04/16/2023   11:59 AM 01/05/2023    9:11 AM  Hemoglobin A1C  Hemoglobin-A1c 4.8 - 5.6 % 9.6  7.5  7.5    Assessment/Plan: 1. Pain due to onychomycosis of toenails of both feet   2. Type 2 diabetes mellitus with hyperlipidemia [facility])     Consent given for treatment. Patient examined. All patient's and/or POA's questions/concerns addressed on today's visit. Dispensed information on applying compression stockings. Mycotic toenails 1-5 debrided in length and girth without incident. Continue foot and shoe inspections daily. Monitor blood glucose per PCP/Endocrinologist's recommendations.Continue soft, supportive shoe gear daily. Report any pedal injuries to medical professional. Call office if there are any quesitons/concerns. -Patient/POA to call should there be question/concern in the interim.   Return in about 3 months (around 02/10/2024).  Jasmine Lopez, DPM      [place] LOCATION: [address] [place] [address]                   Office [phone]   [facility] LOCATION: [address] [place], [place] [address] Office [phone]

## 2023-11-17 ENCOUNTER — Ambulatory Visit: Admitting: Nurse Practitioner

## 2023-12-16 ENCOUNTER — Ambulatory Visit: Admitting: Nurse Practitioner

## 2023-12-21 DIAGNOSIS — L6681 Central centrifugal cicatricial alopecia: Secondary | ICD-10-CM | POA: Diagnosis not present

## 2023-12-22 ENCOUNTER — Ambulatory Visit: Admitting: Nurse Practitioner

## 2023-12-24 ENCOUNTER — Ambulatory Visit (INDEPENDENT_AMBULATORY_CARE_PROVIDER_SITE_OTHER): Admitting: Nurse Practitioner

## 2023-12-24 VITALS — BP 130/70 | HR 84 | Temp 98.1°F | Ht 64.0 in | Wt 188.4 lb

## 2023-12-24 DIAGNOSIS — W19XXXA Unspecified fall, initial encounter: Secondary | ICD-10-CM | POA: Diagnosis not present

## 2023-12-24 DIAGNOSIS — E782 Mixed hyperlipidemia: Secondary | ICD-10-CM | POA: Diagnosis not present

## 2023-12-24 DIAGNOSIS — R41840 Attention and concentration deficit: Secondary | ICD-10-CM | POA: Diagnosis not present

## 2023-12-24 DIAGNOSIS — I10 Essential (primary) hypertension: Secondary | ICD-10-CM

## 2023-12-24 DIAGNOSIS — E785 Hyperlipidemia, unspecified: Secondary | ICD-10-CM | POA: Diagnosis not present

## 2023-12-24 DIAGNOSIS — E1169 Type 2 diabetes mellitus with other specified complication: Secondary | ICD-10-CM

## 2023-12-24 MED ORDER — METFORMIN HCL ER 500 MG PO TB24: 500.0000 mg | ORAL_TABLET | Freq: Two times a day (BID) | ORAL | 1 refills | Status: AC

## 2023-12-24 MED ORDER — RYBELSUS 7 MG PO TABS: 1.0000 | ORAL_TABLET | Freq: Every day | ORAL | 1 refills | Status: DC

## 2023-12-24 NOTE — Assessment & Plan Note (Signed)
 Suboptimal control with A1c of 9.6%. Missed Rybelsus  doses due to irregular schedule and forgetfulness. Blood glucose 125-145 mg/dL preprandially. - Recheck A1c today. - Ensure consistent daily intake of Rybelsus . - Continue metformin , two tablets daily.

## 2023-12-24 NOTE — Assessment & Plan Note (Signed)
 Recent fall with minor head and elbow injury. No medical evaluation sough

## 2023-12-24 NOTE — Assessment & Plan Note (Signed)
Will check vitamin B12

## 2023-12-24 NOTE — Progress Notes (Signed)
 [name], CMA,acting as a Neurosurgeon for [name], FNP.,have documented all relevant documentation on the behalf of [name], FNP,as directed by  [name], FNP while in the presence of [name], FNP.  Subjective:  Patient ID: Jasmine Lopez , female    DOB: 1941/02/25 , 83 y.o.   MRN: [id number]  Chief Complaint  Patient presents with   Hypertension    Patient presents today for a bp and dm follow up, Patient reports compliance with medication. Patient denies any chest pain, SOB, or headaches. Patient hasn't taking chol meds in 3 weeks.     HPI Discussed the use of AI scribe software for clinical note transcription with the patient, who gave verbal consent to proceed.  History of Present Illness Jasmine Lopez is an 83 year old female with diabetes who presents with medication management issues.  She has been experiencing difficulty maintaining a consistent schedule for taking her diabetes medication, Rybelsus , missing doses over the past three weeks and taking it only intermittently. She reports that her irregular eating schedule and being frequently away from home have contributed to her inconsistency in taking Rybelsus . She takes metformin , one tablet in the morning and one in the evening, and has recently increased her dose back to two tablets daily due to high A1c levels and blood sugar readings between 125 and 145 mg/dL before meals.  About a month ago, she experienced a fall in a grocery store [employer]), resulting in a bump to her head and elbow. Despite an ambulance being called, she did not seek medical attention and reports no ongoing symptoms from the fall.  She has stopped taking amlodipine , which was previously prescribed for blood pressure management by Dr. Raford. She is unsure of why she stopped taking the medication. She notes that her left leg appears larger than her right, which she attributes to past plantar fasciitis and home therapy, but she does  not report any pain or significant concern.  She is preparing for a family reunion in [place]  and expresses feeling mentally scattered. Her son has been providing her with tea to help with concentration and focus.   Past Medical History:  Diagnosis Date   Allergy    Anxiety    Breast cancer (HCC)    Cancer (HCC)    Class 1 obesity due to excess calories with body mass index (BMI) of 34.0 to 34.9 in adult 06/14/2023   Class 1 obesity due to excess calories with serious comorbidity and body mass index (BMI) of 33.0 to 33.9 in adult 04/22/2023   Diabetes mellitus without complication (HCC)    Dysphagia    Fall 10/20/2022   GERD (gastroesophageal reflux disease) 10/12/2020   Hair loss 10/12/2020   Heart murmur    Hoarseness of voice 07/15/2018   Hx of adenomatous polyp of colon 06/2015   Dr. Alm Blowers- [facility]   Hypertension    Irregular heart beat    Sleep apnea    SVT (supraventricular tachycardia) (HCC) 08/11/2022     Family History  Problem Relation Age of Onset   Heart failure Mother        CAD   COPD Mother    Hypertension Mother    Arthritis Mother    Heart disease Mother    Varicose Veins Mother    Cancer Father    Rectal cancer Maternal Grandmother        Unsure   Colon cancer Maternal Grandmother    Arthritis Maternal  Grandmother    Cancer Maternal Grandmother    Diabetes Maternal Grandmother    Heart attack Maternal Grandfather    Heart disease Maternal Grandfather    Hypertension Maternal Grandfather    Esophageal cancer Neg Hx    Stomach cancer Neg Hx    Breast cancer Neg Hx      Current Outpatient Medications:    allopurinol  (ZYLOPRIM ) 100 MG tablet, TAKE 1 TABLET(100 MG) BY MOUTH DAILY, Disp: 90 tablet, Rfl: 1   Biotin 1000 MCG tablet, Take 1,000 mcg by mouth daily., Disp: , Rfl:    Blood Glucose Monitoring Suppl DEVI, Use to check glucose twice daily. May substitute to any manufacturer covered by patient's insurance., Disp: 1 each, Rfl: 0    buPROPion  (WELLBUTRIN  XL) 150 MG 24 hr tablet, Take 1 tablet (150 mg total) by mouth daily., Disp: 30 tablet, Rfl: 5   carvedilol  (COREG ) 6.25 MG tablet, Take 1 tablet (6.25 mg total) by mouth 2 (two) times daily with a meal., Disp: 180 tablet, Rfl: 3   cetirizine (ZYRTEC) 10 MG tablet, Take 10 mg by mouth daily., Disp: , Rfl:    Cholecalciferol (VITAMIN D3) 125 MCG (5000 UT) CAPS, Take 1 capsule by mouth daily., Disp: , Rfl:    COD LIVER OIL PO, Take 1 capsule by mouth daily., Disp: , Rfl:    Garlic 1000 MG CAPS, Take 1 capsule by mouth daily., Disp: , Rfl:    Glucose Blood (BLOOD GLUCOSE TEST STRIPS) STRP, Use to check glucose twice daily. May substitute to any manufacturer covered by patient's insurance., Disp: 100 strip, Rfl: 3   Lancet Device MISC, Use to check glucose twice daily. May substitute to any manufacturer covered by patient's insurance., Disp: 1 each, Rfl: 0   Lancets Misc. (ACCU-CHEK FASTCLIX LANCET) KIT, USE TO CHECK GLUCOSE TWICE DAILY, Disp: 1 kit, Rfl: 1   Misc Natural Products (TURMERIC CURCUMIN) CAPS, Take 1 capsule by mouth daily., Disp: , Rfl:    Multiple Vitamin (MULTIVITAMIN) capsule, Take 1 capsule by mouth daily., Disp: , Rfl:    rosuvastatin  (CRESTOR ) 20 MG tablet, TAKE 1 TABLET(20 MG) BY MOUTH DAILY, Disp: 90 tablet, Rfl: 1   Semaglutide  (RYBELSUS ) 7 MG TABS, Take 1 tablet (7 mg total) by mouth daily. Take 30 minutes before breakfast, Disp: 90 tablet, Rfl: 1   valsartan -hydrochlorothiazide  (DIOVAN  HCT) 160-12.5 MG tablet, Take 1 tablet by mouth daily., Disp: 90 tablet, Rfl: 3   amLODipine  (NORVASC ) 10 MG tablet, Take 1 tablet (10 mg total) by mouth daily. (Patient not taking: Reported on 12/24/2023), Disp: 90 tablet, Rfl: 3   metFORMIN  (GLUCOPHAGE -XR) 500 MG 24 hr tablet, Take 1 tablet (500 mg total) by mouth 2 (two) times daily with a meal., Disp: 180 tablet, Rfl: 1  Current Facility-Administered Medications:    0.9 %  sodium chloride  infusion, 500 mL, Intravenous,  Once, Armbruster, Elspeth SQUIBB, MD   Allergies  Allergen Reactions   Ace Inhibitors Cough   Atorvastatin  Other (See Comments)    Leg pain    Benadryl [Diphenhydramine] Itching    Benadryl cream   Peppermint Flavor [Flavoring Agent (Non-Screening)]     Causes horsenss     Review of Systems  Constitutional: Negative.  Negative for fatigue.  Respiratory: Negative.    Cardiovascular: Negative.   Gastrointestinal:  Negative for abdominal pain.  Endocrine: Negative for polydipsia, polyphagia and polyuria.  Neurological: Negative.  Negative for dizziness.  Psychiatric/Behavioral: Negative.       Today's Vitals   12/24/23  1039  BP: 130/70  Pulse: 84  Temp: 98.1 F (36.7 C)  TempSrc: Oral  Weight: 188 lb 6.4 oz (85.5 kg)  Height: 5' 4 (1.626 m)  PainSc: 0-No pain   Body mass index is 32.34 kg/m.  Wt Readings from Last 3 Encounters:  12/24/23 188 lb 6.4 oz (85.5 kg)  09/16/23 186 lb (84.4 kg)  08/06/23 184 lb 6.4 oz (83.6 kg)     Objective:  Physical Exam Vitals and nursing note reviewed.  Constitutional:      General: She is not in acute distress.    Appearance: Normal appearance. She is obese.  Cardiovascular:     Rate and Rhythm: Normal rate and regular rhythm.     Pulses: Normal pulses.     Heart sounds: Normal heart sounds. No murmur heard. Pulmonary:     Effort: Pulmonary effort is normal. No respiratory distress.     Breath sounds: No wheezing.  Skin:    General: Skin is warm.     Capillary Refill: Capillary refill takes less than 2 seconds.  Neurological:     General: No focal deficit present.     Mental Status: She is alert and oriented to person, place, and time.     Cranial Nerves: No cranial nerve deficit.     Motor: No weakness.  Psychiatric:        Mood and Affect: Mood normal.        Behavior: Behavior normal.        Thought Content: Thought content normal.        Judgment: Judgment normal.      Assessment And Plan:  Essential  hypertension Assessment & Plan: Blood pressure is controlled. Management complicated by unreported discontinuation of amlodipine . Blood pressure control status unclear. - Inform Dr. Raford about amlodipine  discontinuation.  Orders: -     BMP8+eGFR  Type 2 diabetes mellitus with hyperlipidemia (HCC) Assessment & Plan: Suboptimal control with A1c of 9.6%. Missed Rybelsus  doses due to irregular schedule and forgetfulness. Blood glucose 125-145 mg/dL preprandially. - Recheck A1c today. - Ensure consistent daily intake of Rybelsus . - Continue metformin , two tablets daily.  Orders: -     BMP8+eGFR -     Hemoglobin A1c -     Lipid panel -     Microalbumin / creatinine urine ratio -     metFORMIN  HCl ER; Take 1 tablet (500 mg total) by mouth 2 (two) times daily with a meal.  Dispense: 180 tablet; Refill: 1 -     Rybelsus ; Take 1 tablet (7 mg total) by mouth daily. Take 30 minutes before breakfast  Dispense: 90 tablet; Refill: 1  Poor concentration Assessment & Plan: Will check vitamin B12   Orders: -     Vitamin B12 -     TSH  Fall, initial encounter Assessment & Plan: Recent fall with minor head and elbow injury. No medical evaluation sough     Return for controlled DM check 4 months.  Patient was given opportunity to ask questions. Patient verbalized understanding of the plan and was able to repeat key elements of the plan. All questions were answered to their satisfaction.    LILLETTE [name], FNP, have reviewed all documentation for this visit. The documentation on 12/24/23 for the exam, diagnosis, procedures, and orders are all accurate and complete.   IF YOU HAVE BEEN REFERRED TO A SPECIALIST, IT MAY TAKE 1-2 WEEKS TO SCHEDULE/PROCESS THE REFERRAL. IF YOU HAVE NOT HEARD FROM US /SPECIALIST IN TWO WEEKS,  PLEASE GIVE US  A CALL AT [phone].

## 2023-12-24 NOTE — Assessment & Plan Note (Signed)
 Blood pressure is controlled. Management complicated by unreported discontinuation of amlodipine . Blood pressure control status unclear. - Inform Dr. Raford about amlodipine  discontinuation.

## 2023-12-26 LAB — LIPID PANEL
Chol/HDL Ratio: 2.7 ratio (ref 0.0–4.4)
Cholesterol, Total: 129 mg/dL (ref 100–199)
HDL: 48 mg/dL (ref >39)
LDL Chol Calc (NIH): 70 mg/dL (ref 0–99)
Triglycerides: 49 mg/dL (ref 0–149)
VLDL Cholesterol Cal: 11 mg/dL (ref 5–40)

## 2023-12-26 LAB — BMP8+EGFR
BUN/Creatinine Ratio: 20 (ref 12–28)
BUN: 17 mg/dL (ref 8–27)
CO2: 21 mmol/L (ref 20–29)
Calcium: 9.5 mg/dL (ref 8.7–10.3)
Chloride: 99 mmol/L (ref 96–106)
Creatinine, Ser: 0.86 mg/dL (ref 0.57–1.00)
Glucose: 152 mg/dL — ABNORMAL HIGH (ref 70–99)
Potassium: 4.5 mmol/L (ref 3.5–5.2)
Sodium: 136 mmol/L (ref 134–144)
eGFR: 68 mL/min/1.73 (ref >59)

## 2023-12-26 LAB — HEMOGLOBIN A1C
Est. average glucose Bld gHb Est-mCnc: 189 mg/dL
Hgb A1c MFr Bld: 8.2 % — ABNORMAL HIGH (ref 4.8–5.6)

## 2023-12-26 LAB — MICROALBUMIN / CREATININE URINE RATIO
Creatinine, Urine: 58.8 mg/dL
Microalb/Creat Ratio: 8 mg/g{creat} (ref 0–29)
Microalbumin, Urine: 4.5 ug/mL

## 2023-12-26 LAB — VITAMIN B12: Vitamin B-12: 569 pg/mL (ref 232–1245)

## 2023-12-26 LAB — TSH: TSH: 1.16 u[IU]/mL (ref 0.450–4.500)

## 2023-12-30 ENCOUNTER — Ambulatory Visit (INDEPENDENT_AMBULATORY_CARE_PROVIDER_SITE_OTHER)

## 2023-12-30 DIAGNOSIS — Z0000 Encounter for general adult medical examination without abnormal findings: Secondary | ICD-10-CM

## 2023-12-30 NOTE — Patient Instructions (Signed)
 Jasmine Lopez , Thank you for taking time out of your busy schedule to complete your Annual Wellness Visit with me. I enjoyed our conversation and look forward to speaking with you again next year. I, as well as your care team,  appreciate your ongoing commitment to your health goals. Please review the following plan we discussed and let me know if I can assist you in the future. Your Game plan/ To Do List    Referrals: If you haven't heard from the office you've been referred to, please reach out to them at the phone provided.   Follow up Visits: We will see or speak with you next year for your Next Medicare AWV with our clinical staff Have you seen your provider in the last 6 months (3 months if uncontrolled diabetes)? Yes  Clinician Recommendations:  Aim for 30 minutes of exercise or brisk walking, 6-8 glasses of water, and 5 servings of fruits and vegetables each day.       This is a list of the screenings recommended for you:  Health Maintenance  Topic Date Due   COVID-19 Vaccine (5 - 2024-25 season) 01/09/2024*   Zoster (Shingles) Vaccine (1 of 2) 03/25/2024*   Pneumococcal Vaccine for age over 48 (1 of 2 - PCV) 07/14/2024*   Flu Shot  08/09/2024*   Eye exam for diabetics  01/01/2024   Complete foot exam   01/13/2024   Hemoglobin A1C  06/25/2024   Yearly kidney function blood test for diabetes  12/23/2024   Yearly kidney health urinalysis for diabetes  12/23/2024   Medicare Annual Wellness Visit  12/29/2024   Colon Cancer Screening  01/10/2026   DTaP/Tdap/Td vaccine (2 - Td or Tdap) 11/06/2031   DEXA scan (bone density measurement)  Completed   HPV Vaccine  Aged Out   Meningitis B Vaccine  Aged Out   Hepatitis C Screening  Discontinued  *Topic was postponed. The date shown is not the original due date.    Advanced directives: (ACP Link)Information on Advanced Care Planning can be found at [place]  Secretary of [facility] Advance Health Care Directives Advance Health Care  Directives. [email or web address]  Advance Care Planning is important because it:  [x]  Makes sure you receive the medical care that is consistent with your values, goals, and preferences  [x]  It provides guidance to your family and loved ones and reduces their decisional burden about whether or not they are making the right decisions based on your wishes.  Follow the link provided in your after visit summary or read over the paperwork we have mailed to you to help you started getting your Advance Directives in place. If you need assistance in completing these, please reach out to us  so that we can help you!  See attachments for Preventive Care and Fall Prevention Tips.

## 2023-12-30 NOTE — Progress Notes (Signed)
 Subjective:   Jasmine Lopez is a 83 y.o. who presents for a Medicare Wellness preventive visit.  As a reminder, Annual Wellness Visits don't include a physical exam, and some assessments may be limited, especially if this visit is performed virtually. We may recommend an in-person follow-up visit with your provider if needed.  Visit Complete: Virtual I connected with  Jasmine Lopez on 12/30/23 by a audio enabled telemedicine application and verified that I am speaking with the correct person using two identifiers.  Patient Location: Home  Provider Location: Office/Clinic  I discussed the limitations of evaluation and management by telemedicine. The patient expressed understanding and agreed to proceed.  Vital Signs: Because this visit was a virtual/telehealth visit, some criteria may be missing or patient reported. Any vitals not documented were not able to be obtained and vitals that have been documented are patient reported.  VideoError- Librarian, academic were attempted between this provider and patient, however failed, due to patient having technical difficulties OR patient did not have access to video capability.  We continued and completed visit with audio only.   Persons Participating in Visit: Patient.  AWV Questionnaire: No: Patient Medicare AWV questionnaire was not completed prior to this visit.  Cardiac Risk Factors include: advanced age (>45men, >38 women);diabetes mellitus;dyslipidemia;hypertension     Objective:    Today's Vitals   There is no height or weight on file to calculate BMI.     12/30/2023   10:31 AM 07/27/2023   10:41 AM 12/25/2022   11:04 AM 12/04/2021    9:18 AM 10/24/2020    3:14 PM 10/13/2019    3:04 PM 02/14/2019   12:10 PM  Advanced Directives  Does Patient Have a Medical Advance Directive? No No No No No No Yes  Type of Tax inspector;Living will  Copy of Healthcare Power of  Attorney in Chart?       No - copy requested  Would patient like information on creating a medical advance directive? No - Patient declined Yes (MAU/Ambulatory/Procedural Areas - Information given)  Yes (MAU/Ambulatory/Procedural Areas - Information given) Yes (MAU/Ambulatory/Procedural Areas - Information given) No - Patient declined     Current Medications (verified) Outpatient Encounter Medications as of 12/30/2023  Medication Sig   allopurinol  (ZYLOPRIM ) 100 MG tablet TAKE 1 TABLET(100 MG) BY MOUTH DAILY   Biotin 1000 MCG tablet Take 1,000 mcg by mouth daily.   Blood Glucose Monitoring Suppl DEVI Use to check glucose twice daily. May substitute to any manufacturer covered by patient's insurance.   buPROPion  (WELLBUTRIN  XL) 150 MG 24 hr tablet Take 1 tablet (150 mg total) by mouth daily.   carvedilol  (COREG ) 6.25 MG tablet Take 1 tablet (6.25 mg total) by mouth 2 (two) times daily with a meal.   cetirizine (ZYRTEC) 10 MG tablet Take 10 mg by mouth daily.   Cholecalciferol (VITAMIN D3) 125 MCG (5000 UT) CAPS Take 1 capsule by mouth daily.   COD LIVER OIL PO Take 1 capsule by mouth daily.   Garlic 1000 MG CAPS Take 1 capsule by mouth daily.   Glucose Blood (BLOOD GLUCOSE TEST STRIPS) STRP Use to check glucose twice daily. May substitute to any manufacturer covered by patient's insurance.   Lancet Device MISC Use to check glucose twice daily. May substitute to any manufacturer covered by patient's insurance.   Lancets Misc. (ACCU-CHEK FASTCLIX LANCET) KIT USE TO CHECK GLUCOSE TWICE DAILY  metFORMIN  (GLUCOPHAGE -XR) 500 MG 24 hr tablet Take 1 tablet (500 mg total) by mouth 2 (two) times daily with a meal.   Misc Natural Products (TURMERIC CURCUMIN) CAPS Take 1 capsule by mouth daily.   Multiple Vitamin (MULTIVITAMIN) capsule Take 1 capsule by mouth daily.   rosuvastatin  (CRESTOR ) 20 MG tablet TAKE 1 TABLET(20 MG) BY MOUTH DAILY   Semaglutide  (RYBELSUS ) 7 MG TABS Take 1 tablet (7 mg total) by  mouth daily. Take 30 minutes before breakfast   valsartan -hydrochlorothiazide  (DIOVAN  HCT) 160-12.5 MG tablet Take 1 tablet by mouth daily.   amLODipine  (NORVASC ) 10 MG tablet Take 1 tablet (10 mg total) by mouth daily. (Patient not taking: Reported on 12/30/2023)   Facility-Administered Encounter Medications as of 12/30/2023  Medication   0.9 %  sodium chloride  infusion    Allergies (verified) Ace inhibitors, Atorvastatin , Benadryl [diphenhydramine], and Peppermint flavor [flavoring agent (non-screening)]   History: Past Medical History:  Diagnosis Date   Allergy    Anxiety    Breast cancer (HCC)    Cancer (HCC)    Class 1 obesity due to excess calories with body mass index (BMI) of 34.0 to 34.9 in adult 06/14/2023   Class 1 obesity due to excess calories with serious comorbidity and body mass index (BMI) of 33.0 to 33.9 in adult 04/22/2023   Diabetes mellitus without complication (HCC)    Dysphagia    Fall 10/20/2022   GERD (gastroesophageal reflux disease) 10/12/2020   Hair loss 10/12/2020   Heart murmur    Hoarseness of voice 07/15/2018   Hx of adenomatous polyp of colon 06/2015   Dr. Alm Blowers- [facility]   Hypertension    Irregular heart beat    Sleep apnea    SVT (supraventricular tachycardia) (HCC) 08/11/2022   Past Surgical History:  Procedure Laterality Date   BREAST EXCISIONAL BIOPSY Bilateral    BREAST LUMPECTOMY Left 2016   Family History  Problem Relation Age of Onset   Heart failure Mother        CAD   COPD Mother    Hypertension Mother    Arthritis Mother    Heart disease Mother    Varicose Veins Mother    Cancer Father    Rectal cancer Maternal Grandmother        Unsure   Colon cancer Maternal Grandmother    Arthritis Maternal Grandmother    Cancer Maternal Grandmother    Diabetes Maternal Grandmother    Heart attack Maternal Grandfather    Heart disease Maternal Grandfather    Hypertension Maternal Grandfather    Esophageal cancer Neg Hx     Stomach cancer Neg Hx    Breast cancer Neg Hx    Social History   Socioeconomic History   Marital status: Widowed    Spouse name: Not on file   Number of children: Not on file   Years of education: Not on file   Highest education level: Master's degree (e.g., MA, MS, MEng, MEd, MSW, MBA)  Occupational History   Occupation: retired  Tobacco Use   Smoking status: Never   Smokeless tobacco: Never  Vaping Use   Vaping status: Never Used  Substance and Sexual Activity   Alcohol use: Never   Drug use: Never   Sexual activity: Not Currently  Other Topics Concern   Not on file  Social History Narrative   Lives home alone.  Widowed.  Retired.  MS counseling education. 4 cups coffee/ wk.     Social Drivers of  Health   Financial Resource Strain: Low Risk  (12/30/2023)   Overall Financial Resource Strain (CARDIA)    Difficulty of Paying Living Expenses: Not hard at all  Food Insecurity: No Food Insecurity (12/30/2023)   Hunger Vital Sign    Worried About Running Out of Food in the Last Year: Never true    Ran Out of Food in the Last Year: Never true  Transportation Needs: No Transportation Needs (12/30/2023)   PRAPARE - Administrator, Civil Service (Medical): No    Lack of Transportation (Non-Medical): No  Physical Activity: Insufficiently Active (12/30/2023)   Exercise Vital Sign    Days of Exercise per Week: 2 days    Minutes of Exercise per Session: 60 min  Stress: Stress Concern Present (12/30/2023)   [employer] of Occupational Health - Occupational Stress Questionnaire    Feeling of Stress: To some extent  Social Connections: Moderately Integrated (12/30/2023)   Social Connection and Isolation Panel    Frequency of Communication with Friends and Family: More than three times a week    Frequency of Social Gatherings with Friends and Family: Once a week    Attends Religious Services: More than 4 times per year    Active Member of [employer] or Organizations: Yes     Attends Banker Meetings: More than 4 times per year    Marital Status: Widowed    Tobacco Counseling Counseling given: Not Answered    Clinical Intake:  Pre-visit preparation completed: Yes  Pain : No/denies pain     Nutritional Risks: None Diabetes: Yes CBG done?: No Did pt. bring in CBG monitor from home?: No  Lab Results  Component Value Date   HGBA1C 8.2 (H) 12/24/2023   HGBA1C 9.6 (H) 07/15/2023   HGBA1C 7.5 (H) 04/16/2023     How often do you need to have someone help you when you read instructions, pamphlets, or other written materials from your doctor or pharmacy?: 1 - Never  Interpreter Needed?: No  Information entered by :: NAllen LPN   Activities of Daily Living     12/30/2023   10:18 AM  In your present state of health, do you have any difficulty performing the following activities:  Hearing? 0  Vision? 0  Difficulty concentrating or making decisions? 0  Walking or climbing stairs? 1  Dressing or bathing? 0  Doing errands, shopping? 0  Preparing Food and eating ? N  Using the Toilet? N  In the past six months, have you accidently leaked urine? Y  Comment with a sneeze once or twice  Do you have problems with loss of bowel control? N  Managing your Medications? N  Managing your Finances? N  Housekeeping or managing your Housekeeping? N    Patient Care Team: Georgina Speaks, FNP as PCP - General (General Practice) Raford Riggs, MD as PCP - Cardiology (Cardiology) Jama No (Inactive)  I have updated your Care Teams any recent Medical Services you may have received from other providers in the past year.     Assessment:   This is a routine wellness examination for Jasmine Lopez.  Hearing/Vision screen Hearing Screening - Comments:: Denies hearing issues Vision Screening - Comments:: Regular eye exams, Dr. Octavia   Goals Addressed             This Visit's Progress    Patient Stated       12/30/2023, maintain  independence       Depression Screen  12/30/2023   10:37 AM 12/24/2023   10:55 AM 07/27/2023   10:32 AM 07/15/2023   11:39 AM 06/10/2023   10:31 AM 12/25/2022   11:07 AM 09/11/2022   10:24 AM  PHQ 2/9 Scores  PHQ - 2 Score 1 3 0 3 4 0 0  PHQ- 9 Score 3 12  15 10  0 0    Fall Risk     12/30/2023   10:33 AM 07/27/2023   10:32 AM 06/10/2023    9:54 AM 12/25/2022   11:06 AM 09/11/2022   10:24 AM  Fall Risk   Falls in the past year? 1 1 0 1 1  Comment fell in grocery store   tripped   Number falls in past yr: 1 1 0 1 0  Injury with Fall? 0 1 0 0 1  Risk for fall due to : Medication side effect  No Fall Risks No Fall Risks;History of fall(s);Medication side effect History of fall(s)  Follow up Falls prevention discussed;Falls evaluation completed  Falls evaluation completed Falls prevention discussed Falls evaluation completed    MEDICARE RISK AT HOME:  Medicare Risk at Home Any stairs in or around the home?: Yes If so, are there any without handrails?: No Home free of loose throw rugs in walkways, pet beds, electrical cords, etc?: Yes Adequate lighting in your home to reduce risk of falls?: Yes Life alert?: No Use of a cane, walker or w/c?: No Grab bars in the bathroom?: Yes Shower chair or bench in shower?: No Elevated toilet seat or a handicapped toilet?: Yes  TIMED UP AND GO:  Was the test performed?  No  Cognitive Function: 6CIT completed    11/06/2021    2:00 PM 11/06/2020    2:12 PM 11/01/2019    7:38 AM 05/17/2019    1:28 PM  MMSE - Mini Mental State Exam  Orientation to time 5 5 5 5   Orientation to Place 5 5 5 5   Registration 3 3 3 3   Attention/ Calculation 4 2 2 2   Recall 3 3 2 3   Language- name 2 objects 2 2 2 2   Language- repeat 1 1 1 1   Language- follow 3 step command 3 2 3 3   Language- read & follow direction 1 1 1 1   Write a sentence 1 1 1 1   Copy design 1 1 1  0  Copy design-comments   10 animals   Total score 29 26 26 26       03/11/2023    9:34 AM  03/10/2022    9:03 AM  Montreal Cognitive Assessment   Visuospatial/ Executive (0/5) 4 4  Naming (0/3) 3 3  Attention: Read list of digits (0/2) 1 2  Attention: Read list of letters (0/1) 1 1  Attention: Serial 7 subtraction starting at 100 (0/3) 3 3  Language: Repeat phrase (0/2) 2 1  Language : Fluency (0/1) 0 1  Abstraction (0/2) 1 2  Delayed Recall (0/5) 4 2  Orientation (0/6) 6 6  Total 25 25  Adjusted Score (based on education) 25       12/30/2023   10:39 AM 12/25/2022   11:08 AM 12/04/2021    9:23 AM 11/05/2021   10:00 AM 10/24/2020    3:19 PM  6CIT Screen  What Year? 0 points 0 points 0 points 0 points 0 points  What month? 0 points 0 points 0 points 0 points 0 points  What time? 3 points 0 points 0 points 0 points 0 points  Count back from 20 0 points 0 points 0 points 0 points 2 points  Months in reverse 0 points 0 points 0 points 0 points 0 points  Repeat phrase 0 points 0 points 0 points 0 points 0 points  Total Score 3 points 0 points 0 points 0 points 2 points    Immunizations Immunization History  Administered Date(s) Administered   PFIZER(Purple Top)SARS-COV-2 Vaccination 07/04/2019, 07/25/2019, 02/17/2020   Pfizer Covid-19 Vaccine Bivalent Booster 61yrs & up 04/01/2021   Tdap 11/05/2021    Screening Tests Health Maintenance  Topic Date Due   COVID-19 Vaccine (5 - 2024-25 season) 01/09/2024 (Originally 01/11/2023)   Zoster Vaccines- Shingrix (1 of 2) 03/25/2024 (Originally 08/31/1959)   Pneumococcal Vaccine: 50+ Years (1 of 2 - PCV) 07/14/2024 (Originally 08/31/1959)   INFLUENZA VACCINE  08/09/2024 (Originally 12/11/2023)   OPHTHALMOLOGY EXAM  01/01/2024   FOOT EXAM  01/13/2024   HEMOGLOBIN A1C  06/25/2024   Diabetic kidney evaluation - eGFR measurement  12/23/2024   Diabetic kidney evaluation - Urine ACR  12/23/2024   Medicare Annual Wellness (AWV)  12/29/2024   Colonoscopy  01/10/2026   DTaP/Tdap/Td (2 - Td or Tdap) 11/06/2031   DEXA SCAN  Completed    HPV VACCINES  Aged Out   Meningococcal B Vaccine  Aged Out   Hepatitis C Screening  Discontinued    Health Maintenance  There are no preventive care reminders to display for this patient.  Health Maintenance Items Addressed: Declines vaccines.  Additional Screening:  Vision Screening: Recommended annual ophthalmology exams for early detection of glaucoma and other disorders of the eye. Would you like a referral to an eye doctor? No    Dental Screening: Recommended annual dental exams for proper oral hygiene  Community Resource Referral / Chronic Care Management: CRR required this visit?  No   CCM required this visit?  No   Plan:    I have personally reviewed and noted the following in the patient's chart:   Medical and social history Use of alcohol, tobacco or illicit drugs  Current medications and supplements including opioid prescriptions. Patient is not currently taking opioid prescriptions. Functional ability and status Nutritional status Physical activity Advanced directives List of other physicians Hospitalizations, surgeries, and ER visits in previous 12 months Vitals Screenings to include cognitive, depression, and falls Referrals and appointments  In addition, I have reviewed and discussed with patient certain preventive protocols, quality metrics, and best practice recommendations. A written personalized care plan for preventive services as well as general preventive health recommendations were provided to patient.   Jasmine FORBES Dawn, LPN   [id number]/7974   After Visit Summary: (MyChart) Due to this being a telephonic visit, the after visit summary with patients personalized plan was offered to patient via MyChart   Notes: Nothing significant to report at this time.

## 2024-01-05 DIAGNOSIS — H02413 Mechanical ptosis of bilateral eyelids: Secondary | ICD-10-CM | POA: Diagnosis not present

## 2024-01-05 DIAGNOSIS — H25813 Combined forms of age-related cataract, bilateral: Secondary | ICD-10-CM | POA: Diagnosis not present

## 2024-01-05 DIAGNOSIS — H40013 Open angle with borderline findings, low risk, bilateral: Secondary | ICD-10-CM | POA: Diagnosis not present

## 2024-01-05 DIAGNOSIS — E119 Type 2 diabetes mellitus without complications: Secondary | ICD-10-CM | POA: Diagnosis not present

## 2024-01-05 LAB — HM DIABETES EYE EXAM

## 2024-01-08 ENCOUNTER — Encounter: Attending: Nurse Practitioner | Admitting: Dietician

## 2024-01-08 ENCOUNTER — Encounter: Payer: Self-pay | Admitting: Dietician

## 2024-01-08 DIAGNOSIS — E785 Hyperlipidemia, unspecified: Secondary | ICD-10-CM | POA: Insufficient documentation

## 2024-01-08 DIAGNOSIS — E1169 Type 2 diabetes mellitus with other specified complication: Secondary | ICD-10-CM | POA: Diagnosis not present

## 2024-01-08 NOTE — Patient Instructions (Addendum)
 Return to the gym. Continue to monitor your blood glucose Mindful eating  Avoid or change the snack before bed.  Identify, am I eating this due to hunger or another reason.

## 2024-01-08 NOTE — Progress Notes (Signed)
 Medical Nutrition Therapy  Appointment Start time:  [phone]  Appointment End time:  0945 Patient is here today alone.  She was last seen by this RD on 10/01/2023. This is a phone visit. She states that her appetite is lower since starting on the Rybelsus . She has lost 3 lbs in the past 3 months.  She states that she wishes she had lost more but discussed importance of maintaining muscle. Discussed getting back into her exercise habit as she stopped since returning from [place] . Snacks at night due to being lonely.   Primary concerns today: Patient states that she has gone through a lot in life and wants to get back on track.  She will also go to counseling.  Her husband passed about 5 years ago and her mother a short time later.  She completed a program at the [facility] 2 years ago that helped her lose weight and control her blood sugar.  She is now going to the senior center, taking classes such as yoga and walking. Fasting blood glucose 104-140 rare 190 after eating late the night before (birthday party)  Referral diagnosis: Type 2 Diabetes, HLD Preferred learning style: no preference indicated Learning readiness:  ready, change in progress   NUTRITION ASSESSMENT  63.5 188 lbs 12/24/2023 191 lbs 10/01/2023 188 lbs 07/27/2023 192 lbs 07/15/2023  Clinical Medical Hx: Type 2 Diabetes (2020), HLD, HTN, OSA (no c-pap), GERD Medications: Metformin , Rybelsus   Labs: A1c 8.2% 12/24/2023 decreased from 9.6% 07/15/2023, 7.5% 04/16/2023, Vitamin B-12 670 in 2021, Vitamin D 54 in 2021, eGFR 65 07/15/2023 Lipid Panel     Component Value Date/Time   CHOL 129 12/24/2023 1125   TRIG 49 12/24/2023 1125   HDL 48 12/24/2023 1125   CHOLHDL 2.7 12/24/2023 1125   LDLCALC 70 12/24/2023 1125   LABVLDL 11 12/24/2023 1125   Notable Signs/Symptoms: none  Lifestyle & Dietary Hx Patient lives alone.  She moved from [place] and lives in the home she grew up in.  She has family locally.  She eats out about 3 times per  week. She is retired Teacher, early years/pre. Doesn't tolerate increased amounts of dairy as this causes more sinus issues and coughing. Lower carb and lower sodium  Estimated daily fluid intake: 70 oz Supplements: Turmeric curcumin, garlic, MVI, biotin, vitamin D, Cod liver oil Sleep: good 6-7 hours and wakes 1 time per night Stress / self-care: moderate Current average weekly physical activity: She is now going to the senior center for classes 2 times per week.  Joined the [facility] and plans to go 2 days per week.  She walks occasionally.  24-Hr Dietary Recall First Meal 18:30 am: egg and cheese McMuffin Snack: none Second Meal: fried fish, vegetables, garlic toast Snack: none Third Meal: fried fish, vegetables, garlic toast Snack: fried fish, garlic Beverages: water, coffee with stevia and half and half, unsweetened green tea, rare diet soda  NUTRITION DIAGNOSIS  NB-1.1 Food and nutrition-related knowledge deficit As related to balance of carbohydrates, protein, and fat.  As evidenced by diet hx and patient report.   NUTRITION INTERVENTION  Nutrition education (E-1) on the following topics:  Counseling and diabetes education continued. Discussed basic meal planning and ideas to increase the nutrition in her diet - my plate, need to decrease fat intake Discussed benefits of exercise - consistency, weight bearing  Handouts Provided Include - initial visit How to Thrive:  A Guide for Your Journey with Diabetes by the ADA Meal Plan Card My Plate Snack list  Diabetes Resources [employer] developer of Lifestyle Medicine) packet  Learning Style & Readiness for Change Teaching method utilized: Visual & Auditory  Demonstrated degree of understanding via: Teach Back  Barriers to learning/adherence to lifestyle change: none  Goals Established by Pt Return to the gym.  Walk  Classes  Weights - maintain muscle mass Continue to monitor your blood glucose Mindful eating  Avoid or  change the snack before bed.  Identify, am I eating this due to hunger or another reason.  Add a high protein snack in the middle of the day when eating only 2 meals per day as you need additional nutrition  Cottage cheese and fruit  Hummus, vegetables, whole grain crackers  [place] yogurt with fresh fruit  1 oz cheese and fresh fruit or whole grain crackers  Wrap with a low carb tortilla (or Joseph's pita), hummus, greens and fresh fruit  Small portion chicken or tuna salad with whole grain crackers or 1 slice of bread  Bowl of homemade soup  Aim for 2-3 Carb Choices per meal (30-45 grams) +/- 1 either way  Aim for 0-1 Carbs per snack if hungry  Include protein in moderation with your meals and snacks Consider reading food labels for Total Carbohydrate of foods Consider  increasing your activity level by walking or class for 30 or more minutes daily as tolerated Consider checking BG at alternate times per day  Consider taking medication as directed by MD  MONITORING & EVALUATION Dietary intake, weekly physical activity, and label reading in 3 months.  Next Steps  Patient is to call for questions.

## 2024-02-03 ENCOUNTER — Other Ambulatory Visit: Payer: Self-pay | Admitting: Nurse Practitioner

## 2024-02-03 DIAGNOSIS — Z1231 Encounter for screening mammogram for malignant neoplasm of breast: Secondary | ICD-10-CM

## 2024-02-04 ENCOUNTER — Encounter: Attending: Nurse Practitioner | Admitting: Dietician

## 2024-02-19 ENCOUNTER — Ambulatory Visit
Admission: RE | Admit: 2024-02-19 | Discharge: 2024-02-19 | Disposition: A | Discharge status: Home / Self Care | Source: Ambulatory Visit | Attending: Nurse Practitioner | Admitting: Nurse Practitioner

## 2024-02-19 DIAGNOSIS — Z1231 Encounter for screening mammogram for malignant neoplasm of breast: Secondary | ICD-10-CM

## 2024-02-24 ENCOUNTER — Ambulatory Visit: Admitting: Podiatry

## 2024-02-24 ENCOUNTER — Ambulatory Visit: Payer: Self-pay

## 2024-02-24 ENCOUNTER — Encounter: Payer: Self-pay | Admitting: Podiatry

## 2024-02-24 ENCOUNTER — Encounter: Payer: Self-pay | Admitting: Family Medicine

## 2024-02-24 ENCOUNTER — Ambulatory Visit: Admitting: Family Medicine

## 2024-02-24 VITALS — BP 128/72 | HR 76 | Temp 100.5°F | Ht 63.0 in | Wt 187.2 lb

## 2024-02-24 DIAGNOSIS — M79674 Pain in right toe(s): Secondary | ICD-10-CM | POA: Diagnosis not present

## 2024-02-24 DIAGNOSIS — M2141 Flat foot [pes planus] (acquired), right foot: Secondary | ICD-10-CM

## 2024-02-24 DIAGNOSIS — E1169 Type 2 diabetes mellitus with other specified complication: Secondary | ICD-10-CM | POA: Diagnosis not present

## 2024-02-24 DIAGNOSIS — M79675 Pain in left toe(s): Secondary | ICD-10-CM

## 2024-02-24 DIAGNOSIS — B351 Tinea unguium: Secondary | ICD-10-CM

## 2024-02-24 DIAGNOSIS — R051 Acute cough: Secondary | ICD-10-CM | POA: Diagnosis not present

## 2024-02-24 DIAGNOSIS — M2142 Flat foot [pes planus] (acquired), left foot: Secondary | ICD-10-CM | POA: Diagnosis not present

## 2024-02-24 DIAGNOSIS — Z0189 Encounter for other specified special examinations: Secondary | ICD-10-CM | POA: Diagnosis not present

## 2024-02-24 DIAGNOSIS — R509 Fever, unspecified: Secondary | ICD-10-CM | POA: Diagnosis not present

## 2024-02-24 DIAGNOSIS — E119 Type 2 diabetes mellitus without complications: Secondary | ICD-10-CM

## 2024-02-24 DIAGNOSIS — E785 Hyperlipidemia, unspecified: Secondary | ICD-10-CM | POA: Diagnosis not present

## 2024-02-24 DIAGNOSIS — J069 Acute upper respiratory infection, unspecified: Secondary | ICD-10-CM

## 2024-02-24 LAB — POCT INFLUENZA A/B
Influenza A, POC: NEGATIVE
Influenza B, POC: NEGATIVE

## 2024-02-24 LAB — POC COVID19 BINAXNOW: SARS Coronavirus 2 Ag: NEGATIVE

## 2024-02-24 NOTE — Addendum Note (Signed)
 Addended by: VIVIAN COUNTRYMAN F on: 02/24/2024 02:35 PM   Modules accepted: Orders

## 2024-02-24 NOTE — Patient Instructions (Signed)
 Stay well hydrated. I recommend taking guaifenesin  (the expectorant that is found in Mucinex  and Robitussin).  I recommend getting the PLAIN mucinex , 12 hour, and taking 1 pill twice daily.  You may use this along with your coricidin, as long as you get the PLAIN mucinex  (not D or DM), and you verify that your over-the-counter medication doesn't contain this same ingredient. This will help loosen up any phlegm that is in your sinuses, throat and chest.  Your lungs were clear, no sign of wheezing or infection. You had some clear mucus in the nose--this is likely draining down the throat and contributing to cough and chest congestion. Continue using the coricidin, as this has an antihistamine that will dry this up, and should help with your nasal congestion and cough.  Seek re-evaluation with your primary care doctor if you have persistent or worsening fever, worsening cough, chest pain, shortness of breath or other worsening symptoms.  It was a pleasure meeting you--I hope you feel better soon!

## 2024-02-24 NOTE — Progress Notes (Signed)
 Chief Complaint  Patient presents with   Cough    Cough started yesterday. Had a dry feeling in her throat Monday. Cough is moving to her chest. No covid testing. Last night took coricidin and Emergen C and then got sick (vomited).    Patient has cough, that started last night, chest feels heavy when coughing, vomited once last night.  Yesterday cough started but was feeling not well on Monday.  Cough is nonproductive, but she feels warmth in her chest.  She feels like there is phlegm, but she can't get it up. Chest burning with cough. Vomited last night, after taking kefir. Emesis had some clear phlegm.  Earlier she had taken coricidin and Emergen-C. Vomiting wasn't related to a coughing spell.   Today I don't feel bad--just a little warm. She is more tired than usual. Denies feeling achey, denies headache. She has had some mild runny nose earlier this week, clear mucus.  Denies any known ill contacts.   PMH, PSH, SH reviewed  HTN, gout, obesity DM--not well controlled per last A1c. She reports sugars have been 135-140.  Lab Results  Component Value Date   HGBA1C 8.2 (H) 12/24/2023    Outpatient Encounter Medications as of 02/24/2024  Medication Sig Note   allopurinol  (ZYLOPRIM ) 100 MG tablet TAKE 1 TABLET(100 MG) BY MOUTH DAILY    Biotin 1000 MCG tablet Take 1,000 mcg by mouth daily.    Blood Glucose Monitoring Suppl DEVI Use to check glucose twice daily. May substitute to any manufacturer covered by patient's insurance.    buPROPion  (WELLBUTRIN  XL) 150 MG 24 hr tablet Take 1 tablet (150 mg total) by mouth daily.    carvedilol  (COREG ) 6.25 MG tablet Take 1 tablet (6.25 mg total) by mouth 2 (two) times daily with a meal.    Cholecalciferol (VITAMIN D3) 125 MCG (5000 UT) CAPS Take 1 capsule by mouth daily.    COD LIVER OIL PO Take 1 capsule by mouth daily.    DM-APAP-CPM (CORICIDIN HBP PO) Take 2 tablets by mouth as needed. 02/24/2024: Last dose last night   Garlic 1000 MG  CAPS Take 1 capsule by mouth daily.    Glucose Blood (BLOOD GLUCOSE TEST STRIPS) STRP Use to check glucose twice daily. May substitute to any manufacturer covered by patient's insurance.    Lancet Device MISC Use to check glucose twice daily. May substitute to any manufacturer covered by patient's insurance.    Lancets Misc. (ACCU-CHEK FASTCLIX LANCET) KIT USE TO CHECK GLUCOSE TWICE DAILY    metFORMIN  (GLUCOPHAGE -XR) 500 MG 24 hr tablet Take 1 tablet (500 mg total) by mouth 2 (two) times daily with a meal.    Misc Natural Products (TURMERIC CURCUMIN) CAPS Take 1 capsule by mouth daily.    Multiple Vitamin (MULTIVITAMIN) capsule Take 1 capsule by mouth daily.    Multiple Vitamins-Minerals (EMERGEN-C IMMUNE PO) Take 1 Package by mouth as needed. 02/24/2024: Last dose last night   rosuvastatin  (CRESTOR ) 20 MG tablet TAKE 1 TABLET(20 MG) BY MOUTH DAILY    Semaglutide  (RYBELSUS ) 7 MG TABS Take 1 tablet (7 mg total) by mouth daily. Take 30 minutes before breakfast    valsartan -hydrochlorothiazide  (DIOVAN  HCT) 160-12.5 MG tablet Take 1 tablet by mouth daily.    amLODipine  (NORVASC ) 10 MG tablet Take 1 tablet (10 mg total) by mouth daily. (Patient not taking: Reported on 02/24/2024)    cetirizine (ZYRTEC) 10 MG tablet Take 10 mg by mouth daily. (Patient not taking: Reported on 02/24/2024) 02/24/2024: seasonally  Facility-Administered Encounter Medications as of 02/24/2024  Medication   0.9 %  sodium chloride  infusion   Allergies  Allergen Reactions   Ace Inhibitors Cough   Atorvastatin  Other (See Comments)    Leg pain    Benadryl [Diphenhydramine] Itching    Benadryl cream   Peppermint Flavor [Flavoring Agent (Non-Screening)]     Causes horsenss     ROS: no known fever, chills, no ear pain. Currently denies nausea. Emesis x 1 last night. No diarrhea. No bleeding, bruising, rashes. Denies edema. Saw podiatrist earlier today. No chest pain, shortness of breath.   PHYSICAL EXAM:  BP  128/72   Pulse 76   Temp (!) 100.5 F (38.1 C) (Tympanic)   Ht 5' 3 (1.6 m)   Wt 187 lb 3.2 oz (84.9 kg)   BMI 33.16 kg/m   Wt Readings from Last 3 Encounters:  02/24/24 187 lb 3.2 oz (84.9 kg)  12/24/23 188 lb 6.4 oz (85.5 kg)  09/16/23 186 lb (84.4 kg)   Well-appearing, pleasant female in no distress. Sounds nasally congested. No throat-clearing or coughing during visit HEENT: conjunctiva and sclera are clear, EOMI. TM's and EACs normal. OP is clear.  Nasal mucosa is normal, some clear mucus noted in R nares.  Sinuses are nontender Neck: no lymphadenopathy or mass Heart: regular rate and rhythm with 2/6 SEM at LUSB Lungs: clear bilaterally, no wheezes, rales, rhonci Psych: normal mood, affect, hygiene and grooming Neuro: alert and oriented, cranial nerves grossly intact. Normal gait.  COVID and influenza tests negative   ASSESSMENT/PLAN:  Viral upper respiratory illness - supportive measures reviewed, as well as s/sx for which she should return. To consider repeat COVID/flu tests in 1-2 days if worsening sx  Acute cough - Plan: POC COVID-19  Fever, unspecified fever cause - Plan: POC COVID-19  Stay well hydrated. I recommend taking guaifenesin  (the expectorant that is found in Mucinex  and Robitussin).  I recommend getting the PLAIN mucinex , 12 hour, and taking 1 pill twice daily.  You may use this along with your coricidin, as long as you get the PLAIN mucinex  (not D or DM), and you verify that your over-the-counter medication doesn't contain this same ingredient. This will help loosen up any phlegm that is in your sinuses, throat and chest.  Your lungs were clear, no sign of wheezing or infection. You had some clear mucus in the nose--this is likely draining down the throat and contributing to cough and chest congestion. Continue using the coricidin, as this has an antihistamine that will dry this up, and should help with your nasal congestion and cough.  Seek  re-evaluation with your primary care doctor if you have persistent or worsening fever, worsening cough, chest pain, shortness of breath or other worsening symptoms.  It was a pleasure meeting you--I hope you feel better soon!

## 2024-02-24 NOTE — Telephone Encounter (Signed)
 FYI Only or Action Required?: FYI only for provider.  Patient was last seen in primary care on 12/24/2023 by Georgina Speaks, FNP.  Called Nurse Triage reporting Cough.  Symptoms began several days ago.  Interventions attempted: OTC medications: coricidin.  Symptoms are: gradually worsening.  Triage Disposition: See Physician Within 24 Hours  Patient/caregiver understands and will follow disposition?: Yes   Copied from CRM #[id number]. Topic: Clinical - Red Word Triage >> Feb 24, 2024 10:15 AM Jasmine Lopez wrote: Red Word that prompted transfer to Nurse Triage: Patient was seen by Podiatrist this morning, who advised patient to call PCP and schedule an appt, Patient has cough, that started last night, chest feels heavy when coughing,  vomited once last night. If cough gets bad, it lasts for 3 weeks and ends up with breathing problems Reason for Disposition  SEVERE coughing spells (e.g., whooping sound after coughing, vomiting after coughing)  Answer Assessment - Initial Assessment Questions Additional info: Scheduled acute at alternate regional office. Patient aware of location and provider name.    1. ONSET: When did the cough begin?      Yesterday cough started  but was feeling not well on Monday.  2. SEVERITY: How bad is the cough today?      Moderate-severe  3. SPUTUM: Describe the color of your sputum (e.g., none, dry cough; clear, white, yellow, green)     dry 4. HEMOPTYSIS: Are you coughing up any blood? If Yes, ask: How much? (e.g., flecks, streaks, tablespoons, etc.)     dry 5. DIFFICULTY BREATHING: Are you having difficulty breathing? If Yes, ask: How bad is it? (e.g., mild, moderate, severe)      Speaking in full sentences on call 6. FEVER: Do you have a fever? If Yes, ask: What is your temperature, how was it measured, and when did it start?     denies 7. CARDIAC HISTORY: Do you have any history of heart disease? (e.g., heart attack, congestive heart  failure)      Htn,  svt 8. LUNG HISTORY: Do you have any history of lung disease?  (e.g., pulmonary embolus, asthma, emphysema)     apnea 9. PE RISK FACTORS: Do you have a history of blood clots? (or: recent major surgery, recent prolonged travel, bedridden)      10. OTHER SYMPTOMS: Do you have any other symptoms? (e.g., runny nose, wheezing, chest pain)       Chest congestion. Chest burning with cough. Vomited X 1 last night. Dry throat.  Protocols used: Cough - Acute Productive-A-AH

## 2024-02-28 NOTE — Progress Notes (Signed)
  Subjective:  Patient ID: Jasmine Lopez, female    DOB: [birth year],  MRN: [id number]  Jasmine Lopez presents to clinic today for preventative diabetic foot care for painful elongated mycotic toenails 1-5 bilaterally which are tender when wearing enclosed shoe gear. Pain is relieved with periodic professional debridement.  Chief Complaint  Patient presents with   Diabetes    DFC NIDDM A1C 8.2. LOV with PCP 12/24/23.   New problem(s): None.   PCP is Georgina Speaks, FNP.  Allergies  Allergen Reactions   Ace Inhibitors Cough   Atorvastatin  Other (See Comments)    Leg pain    Benadryl [Diphenhydramine] Itching    Benadryl cream   Peppermint Flavor [Flavoring Agent (Non-Screening)]     Causes horsenss    Review of Systems: Negative except as noted in the HPI.  Objective: No changes noted in today's physical examination. There were no vitals filed for this visit. Jasmine Lopez is a pleasant 83 y.o. female in NAD. AAO x 3.   Diabetic foot exam was performed with the following findings:   No deformities, ulcerations, or other skin breakdown Normal sensation of 10g monofilament Intact posterior tibialis and dorsalis pedis pulses Vascular Examination: Capillary refill time immediate b/l. Palpable pedal pulses. Pedal hair present b/l. Trace edema b/l. No pain with calf compression b/l. Skin temperature gradient WNL b/l. No cyanosis or clubbing b/l. No ischemia or gangrene noted b/l.   Neurological Examination: Sensation grossly intact b/l with 10 gram monofilament. Vibratory sensation intact b/l.   Dermatological Examination: Pedal skin with normal turgor, texture and tone b/l.  No open wounds. No interdigital macerations.   Toenails 1-5 b/l thick, discolored, elongated with subungual debris and pain on dorsal palpation.   No hyperkeratotic nor porokeratotic lesions.  Musculoskeletal Examination: Muscle strength 5/5 to all lower extremity muscle groups bilaterally. No  pain, crepitus or joint limitation noted with ROM bilateral LE. Pes planus b/l.  Radiographs: None     Assessment/Plan: 1. Pain due to onychomycosis of toenails of both feet   2. Pes planus of both feet   3. Type 2 diabetes mellitus with hyperlipidemia (HCC)   4. Encounter for diabetic foot exam (HCC)    Diabetic foot examination performed today. All patient's and/or POA's questions/concerns addressed on today's visit. Toenails 1-5 b/l debrided in length and girth without incident. Continue foot and shoe inspections daily. Monitor blood glucose per PCP/Endocrinologist's recommendations. Continue soft, supportive shoe gear daily. Report any pedal injuries to medical professional. Call office if there are any questions/concerns. -Patient/POA to call should there be question/concern in the interim.   Return in about 3 months (around 05/26/2024).  Delon LITTIE Merlin, DPM      [place] LOCATION: [address] [place] [address]                   Office [phone]   [facility] LOCATION: [address] [place], [place] [address] Office [phone]

## 2024-03-14 ENCOUNTER — Other Ambulatory Visit: Payer: Self-pay | Admitting: Nurse Practitioner

## 2024-03-21 ENCOUNTER — Ambulatory Visit
Admission: RE | Admit: 2024-03-21 | Discharge: 2024-03-21 | Disposition: A | Source: Ambulatory Visit | Attending: Nurse Practitioner | Admitting: Nurse Practitioner

## 2024-03-21 DIAGNOSIS — Z1231 Encounter for screening mammogram for malignant neoplasm of breast: Secondary | ICD-10-CM | POA: Diagnosis not present

## 2024-03-23 DIAGNOSIS — M1990 Unspecified osteoarthritis, unspecified site: Secondary | ICD-10-CM | POA: Diagnosis not present

## 2024-03-23 DIAGNOSIS — E1136 Type 2 diabetes mellitus with diabetic cataract: Secondary | ICD-10-CM | POA: Diagnosis not present

## 2024-03-23 DIAGNOSIS — R32 Unspecified urinary incontinence: Secondary | ICD-10-CM | POA: Diagnosis not present

## 2024-03-23 DIAGNOSIS — E1122 Type 2 diabetes mellitus with diabetic chronic kidney disease: Secondary | ICD-10-CM | POA: Diagnosis not present

## 2024-03-23 DIAGNOSIS — E1151 Type 2 diabetes mellitus with diabetic peripheral angiopathy without gangrene: Secondary | ICD-10-CM | POA: Diagnosis not present

## 2024-03-23 DIAGNOSIS — E669 Obesity, unspecified: Secondary | ICD-10-CM | POA: Diagnosis not present

## 2024-03-23 DIAGNOSIS — F325 Major depressive disorder, single episode, in full remission: Secondary | ICD-10-CM | POA: Diagnosis not present

## 2024-03-23 DIAGNOSIS — E1142 Type 2 diabetes mellitus with diabetic polyneuropathy: Secondary | ICD-10-CM | POA: Diagnosis not present

## 2024-03-23 DIAGNOSIS — F419 Anxiety disorder, unspecified: Secondary | ICD-10-CM | POA: Diagnosis not present

## 2024-03-30 ENCOUNTER — Other Ambulatory Visit: Payer: Self-pay | Admitting: Family Medicine

## 2024-03-30 DIAGNOSIS — F330 Major depressive disorder, recurrent, mild: Secondary | ICD-10-CM

## 2024-04-05 ENCOUNTER — Telehealth: Payer: Self-pay

## 2024-04-05 NOTE — Telephone Encounter (Signed)
 Patient was identified as falling into the [place] Measure - Diabetes.   Patient was: Appointment already scheduled for:  04/25/24.

## 2024-04-18 ENCOUNTER — Telehealth: Payer: Self-pay

## 2024-04-18 NOTE — Telephone Encounter (Signed)
 Patient was identified as falling into the [place] Measure - Diabetes.   Patient was: Appointment already scheduled for:  04/25/2024.

## 2024-04-25 ENCOUNTER — Ambulatory Visit: Payer: Self-pay | Admitting: Nurse Practitioner

## 2024-04-25 ENCOUNTER — Encounter: Payer: Self-pay | Admitting: Nurse Practitioner

## 2024-04-25 VITALS — BP 120/80 | HR 60 | Temp 98.5°F | Ht 63.0 in | Wt 191.8 lb

## 2024-04-25 DIAGNOSIS — E66811 Obesity, class 1: Secondary | ICD-10-CM | POA: Diagnosis not present

## 2024-04-25 DIAGNOSIS — F330 Major depressive disorder, recurrent, mild: Secondary | ICD-10-CM | POA: Diagnosis not present

## 2024-04-25 DIAGNOSIS — Z6833 Body mass index (BMI) 33.0-33.9, adult: Secondary | ICD-10-CM

## 2024-04-25 DIAGNOSIS — E6609 Other obesity due to excess calories: Secondary | ICD-10-CM

## 2024-04-25 DIAGNOSIS — E1169 Type 2 diabetes mellitus with other specified complication: Secondary | ICD-10-CM | POA: Diagnosis not present

## 2024-04-25 DIAGNOSIS — I10 Essential (primary) hypertension: Secondary | ICD-10-CM

## 2024-04-25 DIAGNOSIS — E785 Hyperlipidemia, unspecified: Secondary | ICD-10-CM | POA: Diagnosis not present

## 2024-04-25 DIAGNOSIS — E782 Mixed hyperlipidemia: Secondary | ICD-10-CM

## 2024-04-25 DIAGNOSIS — Z2821 Immunization not carried out because of patient refusal: Secondary | ICD-10-CM | POA: Diagnosis not present

## 2024-04-25 LAB — BMP8+EGFR
BUN/Creatinine Ratio: 13 (ref 12–28)
BUN: 11 mg/dL (ref 8–27)
CO2: 23 mmol/L (ref 20–29)
Calcium: 9.4 mg/dL (ref 8.7–10.3)
Chloride: 101 mmol/L (ref 96–106)
Creatinine, Ser: 0.83 mg/dL (ref 0.57–1.00)
Glucose: 136 mg/dL — ABNORMAL HIGH (ref 70–99)
Potassium: 4.5 mmol/L (ref 3.5–5.2)
Sodium: 140 mmol/L (ref 134–144)
eGFR: 71 mL/min/1.73 (ref >59)

## 2024-04-25 LAB — LIPID PANEL
Chol/HDL Ratio: 2 ratio (ref 0.0–4.4)
Cholesterol, Total: 126 mg/dL (ref 100–199)
HDL: 64 mg/dL (ref >39)
LDL Chol Calc (NIH): 53 mg/dL (ref 0–99)
Triglycerides: 35 mg/dL (ref 0–149)
VLDL Cholesterol Cal: 9 mg/dL (ref 5–40)

## 2024-04-25 LAB — HEMOGLOBIN A1C
Est. average glucose Bld gHb Est-mCnc: 177 mg/dL
Hgb A1c MFr Bld: 7.8 % — ABNORMAL HIGH (ref 4.8–5.6)

## 2024-04-25 MED ORDER — ROSUVASTATIN CALCIUM 20 MG PO TABS: ORAL_TABLET | ORAL | 1 refills | Status: AC

## 2024-04-25 MED ORDER — RYBELSUS 7 MG PO TABS: 1.0000 | ORAL_TABLET | Freq: Every day | ORAL | 1 refills | Status: DC

## 2024-04-25 MED ORDER — BUPROPION HCL ER (XL) 150 MG PO TB24: 150.0000 mg | ORAL_TABLET | Freq: Every day | ORAL | 1 refills | Status: AC

## 2024-04-25 NOTE — Progress Notes (Unsigned)
 [name], CMA,acting as a neurosurgeon for Jasmine Ada, FNP.,have documented all relevant documentation on the behalf of Jasmine Ada, FNP,as directed by  Jasmine Ada, FNP while in the presence of Jasmine Ada, FNP.  Subjective:  Patient ID: Jasmine Lopez , female    DOB: 02/16/1941 , 83 y.o.   MRN: [id number]  Chief Complaint  Patient presents with   Hypertension    Patient presents today for a bp and dm follow up, Patient reports compliance with medication. Patient denies any chest pain, SOB, or headaches. Patient has no concerns today.     HPI  Discussed the use of AI scribe software for clinical note transcription with the patient, who gave verbal consent to proceed.  History of Present Illness Jasmine Lopez is an 83 year old female with diabetes who presents for a follow-up visit.  She has been inconsistent with her diabetes medication, Rybelsus , over the past three weeks due to difficulty timing the medication with meals. The medication was effective when taken consistently. She is currently on a 7 mg dose, having previously started on a 5 mg dose, and wants to resume regular use of the medication. She is also taking metformin  for diabetes management.  She faces challenges with dietary management, particularly during family gatherings where she finds it difficult to resist non-vegetable foods. She admits to limited physical activity, attending chair yoga at the senior center only once a week, attributing this to the cold weather.  Regarding her hypertension, she has stopped taking amlodipine  since August due to concerns about leg swelling, which she attributes to the medication. She has not experienced significant swelling since discontinuing the medication. She continues to take valsartan  for blood pressure management and reports good control. She also takes rosuvastatin  for cholesterol management.  She is currently taking Wellbutrin  for depression and requests a 90-day supply.  In the review of symptoms, her leg swelling occurs primarily in the evenings and is not present currently. She acknowledges inconsistent water intake.  Past Medical History:  Diagnosis Date   Allergy    Anxiety    Breast cancer (HCC)    Cancer (HCC)    Class 1 obesity due to excess calories with body mass index (BMI) of 34.0 to 34.9 in adult 06/14/2023   Class 1 obesity due to excess calories with serious comorbidity and body mass index (BMI) of 33.0 to 33.9 in adult 04/22/2023   Diabetes mellitus without complication (HCC)    Dysphagia    Fall 10/20/2022   GERD (gastroesophageal reflux disease) 10/12/2020   Hair loss 10/12/2020   Heart murmur    Hoarseness of voice 07/15/2018   Hx of adenomatous polyp of colon 06/2015   Dr. Alm Blowers- [facility]   Hypertension    Irregular heart beat    Sleep apnea    SVT (supraventricular tachycardia) 08/11/2022     Family History  Problem Relation Age of Onset   Heart failure Mother        CAD   COPD Mother    Hypertension Mother    Arthritis Mother    Heart disease Mother    Varicose Veins Mother    Cancer Father    Rectal cancer Maternal Grandmother        Unsure   Colon cancer Maternal Grandmother    Arthritis Maternal Grandmother    Cancer Maternal Grandmother    Diabetes Maternal Grandmother    Heart attack Maternal Grandfather    Heart disease Maternal Grandfather  Hypertension Maternal Grandfather    Esophageal cancer Neg Hx    Stomach cancer Neg Hx    Breast cancer Neg Hx      Current Outpatient Medications:    Accu-Chek Softclix Lancets lancets, USE AS DIRECTED TWICE DAILY, Disp: 100 each, Rfl: 0   allopurinol  (ZYLOPRIM ) 100 MG tablet, TAKE 1 TABLET(100 MG) BY MOUTH DAILY, Disp: 90 tablet, Rfl: 1   Biotin 1000 MCG tablet, Take 1,000 mcg by mouth daily., Disp: , Rfl:    Blood Glucose Monitoring Suppl DEVI, Use to check glucose twice daily. May substitute to any manufacturer covered by patient's insurance., Disp:  1 each, Rfl: 0   carvedilol  (COREG ) 6.25 MG tablet, Take 1 tablet (6.25 mg total) by mouth 2 (two) times daily with a meal., Disp: 180 tablet, Rfl: 3   Cholecalciferol (VITAMIN D3) 125 MCG (5000 UT) CAPS, Take 1 capsule by mouth daily., Disp: , Rfl:    COD LIVER OIL PO, Take 1 capsule by mouth daily., Disp: , Rfl:    DM-APAP-CPM (CORICIDIN HBP PO), Take 2 tablets by mouth as needed., Disp: , Rfl:    Garlic 1000 MG CAPS, Take 1 capsule by mouth daily., Disp: , Rfl:    Glucose Blood (BLOOD GLUCOSE TEST STRIPS) STRP, Use to check glucose twice daily. May substitute to any manufacturer covered by patient's insurance., Disp: 100 strip, Rfl: 3   Lancet Device MISC, Use to check glucose twice daily. May substitute to any manufacturer covered by patient's insurance., Disp: 1 each, Rfl: 0   Lancets Misc. (ACCU-CHEK FASTCLIX LANCET) KIT, USE TO CHECK GLUCOSE TWICE DAILY, Disp: 1 kit, Rfl: 1   metFORMIN  (GLUCOPHAGE -XR) 500 MG 24 hr tablet, Take 1 tablet (500 mg total) by mouth 2 (two) times daily with a meal., Disp: 180 tablet, Rfl: 1   Misc Natural Products (TURMERIC CURCUMIN) CAPS, Take 1 capsule by mouth daily., Disp: , Rfl:    Multiple Vitamin (MULTIVITAMIN) capsule, Take 1 capsule by mouth daily., Disp: , Rfl:    Multiple Vitamins-Minerals (EMERGEN-C IMMUNE PO), Take 1 Package by mouth as needed., Disp: , Rfl:    valsartan -hydrochlorothiazide  (DIOVAN  HCT) 160-12.5 MG tablet, Take 1 tablet by mouth daily., Disp: 90 tablet, Rfl: 3   amLODipine  (NORVASC ) 10 MG tablet, Take 1 tablet (10 mg total) by mouth daily. (Patient not taking: Reported on 04/25/2024), Disp: 90 tablet, Rfl: 3   buPROPion  (WELLBUTRIN  XL) 150 MG 24 hr tablet, Take 1 tablet (150 mg total) by mouth daily., Disp: 90 tablet, Rfl: 1   cetirizine (ZYRTEC) 10 MG tablet, Take 10 mg by mouth daily. (Patient not taking: Reported on 04/25/2024), Disp: , Rfl:    rosuvastatin  (CRESTOR ) 20 MG tablet, TAKE 1 TABLET(20 MG) BY MOUTH DAILY, Disp: 90 tablet,  Rfl: 1   Semaglutide  (RYBELSUS ) 7 MG TABS, Take 1 tablet (7 mg total) by mouth daily. Take 30 minutes before breakfast, Disp: 90 tablet, Rfl: 1  Current Facility-Administered Medications:    0.9 %  sodium chloride  infusion, 500 mL, Intravenous, Once, Armbruster, Elspeth SQUIBB, MD   Allergies  Allergen Reactions   Ace Inhibitors Cough   Atorvastatin  Other (See Comments)    Leg pain    Benadryl [Diphenhydramine] Itching    Benadryl cream   Peppermint Flavor [Flavoring Agent (Non-Screening)]     Causes horsenss     Review of Systems  Constitutional: Negative.  Negative for fatigue.  Respiratory: Negative.    Cardiovascular: Negative.   Gastrointestinal:  Negative for abdominal pain.  Endocrine:  Negative for polydipsia, polyphagia and polyuria.  Neurological: Negative.  Negative for dizziness.  Psychiatric/Behavioral: Negative.       Today's Vitals   04/25/24 1109  BP: 120/80  Pulse: 60  Temp: 98.5 F (36.9 C)  TempSrc: Oral  Weight: 191 lb 12.8 oz (87 kg)  Height: 5' 3 (1.6 m)  PainSc: 0-No pain   Body mass index is 33.98 kg/m.  Wt Readings from Last 3 Encounters:  04/25/24 191 lb 12.8 oz (87 kg)  02/24/24 187 lb 3.2 oz (84.9 kg)  12/24/23 188 lb 6.4 oz (85.5 kg)     Objective:  Physical Exam Vitals and nursing note reviewed.  Constitutional:      General: She is not in acute distress.    Appearance: Normal appearance. She is obese.  Cardiovascular:     Rate and Rhythm: Normal rate and regular rhythm.     Pulses: Normal pulses.     Heart sounds: Normal heart sounds. No murmur heard. Pulmonary:     Effort: Pulmonary effort is normal. No respiratory distress.     Breath sounds: No wheezing.  Skin:    General: Skin is warm.     Capillary Refill: Capillary refill takes less than 2 seconds.  Neurological:     General: No focal deficit present.     Mental Status: She is alert and oriented to person, place, and time.     Cranial Nerves: No cranial nerve deficit.      Motor: No weakness.  Psychiatric:        Mood and Affect: Mood normal.        Behavior: Behavior normal.        Thought Content: Thought content normal.        Judgment: Judgment normal.      Assessment And Plan:   Assessment & Plan Essential hypertension  Mixed hyperlipidemia  Type 2 diabetes mellitus with hyperlipidemia (HCC)  Herpes zoster vaccination declined  Class 1 obesity due to excess calories with serious comorbidity and body mass index (BMI) of 33.0 to 33.9 in adult  Mild episode of recurrent major depressive disorder   Orders Placed This Encounter  Procedures   Hemoglobin A1c   Lipid panel   BMP8+eGFR    Assessment & Plan Type 2 diabetes mellitus with hyperlipidemia Non-adherence to Rybelsus  due to forgetfulness and logistical issues. A1c was improving prior to non-adherence. - Provided Rybelsus  7 mg samples to restart. - Encouraged adherence to Rybelsus . - Ordered blood work for A1c and lipid levels.  Essential hypertension Non-adherence to amlodipine  due to leg swelling concerns. Blood pressure well-controlled with valsartan -hydrochlorothiazide . - Continue valsartan -hydrochlorothiazide . - Advised compression socks for leg swelling. - Encouraged hydration.  Mixed hyperlipidemia Managed with rosuvastatin . - Continue rosuvastatin .  Major depressive disorder, recurrent, mild Managed with Wellbutrin . - Provided 90-day supply of Wellbutrin .   Return for controlled DM check 4 months/HM.  Patient was given opportunity to ask questions. Patient verbalized understanding of the plan and was able to repeat key elements of the plan. All questions were answered to their satisfaction.    LILLETTE Jasmine Ada, FNP, have reviewed all documentation for this visit. The documentation on 04/25/2024 for the exam, diagnosis, procedures, and orders are all accurate and complete.   IF YOU HAVE BEEN REFERRED TO A SPECIALIST, IT MAY TAKE 1-2 WEEKS TO SCHEDULE/PROCESS THE  REFERRAL. IF YOU HAVE NOT HEARD FROM US /SPECIALIST IN TWO WEEKS, PLEASE GIVE US  A CALL AT [phone].

## 2024-05-01 ENCOUNTER — Ambulatory Visit: Payer: Self-pay | Admitting: Nurse Practitioner

## 2024-05-01 NOTE — Assessment & Plan Note (Signed)
 Managed with Wellbutrin . - Provided 90-day supply of Wellbutrin .

## 2024-05-01 NOTE — Assessment & Plan Note (Signed)
 She is encouraged to strive for BMI less than 30 to decrease cardiac risk. Advised to aim for at least 150 minutes of exercise per week.

## 2024-05-01 NOTE — Assessment & Plan Note (Signed)
 Non-adherence to Rybelsus  due to forgetfulness and logistical issues. A1c was improving prior to non-adherence. - Provided Rybelsus  7 mg samples to restart. - Encouraged adherence to Rybelsus . - Ordered blood work for A1c and lipid levels.

## 2024-05-01 NOTE — Assessment & Plan Note (Signed)
 Non-adherence to amlodipine  due to leg swelling concerns. Blood pressure well-controlled with valsartan -hydrochlorothiazide . - Continue valsartan -hydrochlorothiazide . - Advised compression socks for leg swelling. - Encouraged hydration.

## 2024-05-01 NOTE — Assessment & Plan Note (Signed)
 Managed with rosuvastatin . Continue rosuvastatin .

## 2024-05-15 ENCOUNTER — Emergency Department (HOSPITAL_COMMUNITY)
Admission: EM | Admit: 2024-05-15 | Discharge: 2024-05-15 | Disposition: A | Discharge status: Home / Self Care | Attending: Emergency Medicine | Admitting: Emergency Medicine

## 2024-05-15 ENCOUNTER — Encounter (HOSPITAL_COMMUNITY): Payer: Self-pay

## 2024-05-15 ENCOUNTER — Other Ambulatory Visit: Payer: Self-pay

## 2024-05-15 ENCOUNTER — Emergency Department (HOSPITAL_COMMUNITY)

## 2024-05-15 DIAGNOSIS — M542 Cervicalgia: Secondary | ICD-10-CM | POA: Insufficient documentation

## 2024-05-15 DIAGNOSIS — S3011XA Contusion of abdominal wall, initial encounter: Secondary | ICD-10-CM | POA: Insufficient documentation

## 2024-05-15 DIAGNOSIS — S82831A Other fracture of upper and lower end of right fibula, initial encounter for closed fracture: Secondary | ICD-10-CM | POA: Diagnosis not present

## 2024-05-15 DIAGNOSIS — R109 Unspecified abdominal pain: Secondary | ICD-10-CM | POA: Diagnosis not present

## 2024-05-15 DIAGNOSIS — Y92410 Unspecified street and highway as the place of occurrence of the external cause: Secondary | ICD-10-CM | POA: Diagnosis not present

## 2024-05-15 DIAGNOSIS — S0083XA Contusion of other part of head, initial encounter: Secondary | ICD-10-CM | POA: Insufficient documentation

## 2024-05-15 DIAGNOSIS — R0789 Other chest pain: Secondary | ICD-10-CM | POA: Insufficient documentation

## 2024-05-15 DIAGNOSIS — S99911A Unspecified injury of right ankle, initial encounter: Secondary | ICD-10-CM | POA: Diagnosis present

## 2024-05-15 LAB — BASIC METABOLIC PANEL WITH GFR
Anion gap: 9 (ref 5–15)
BUN: 14 mg/dL (ref 8–23)
CO2: 26 mmol/L (ref 22–32)
Calcium: 9.2 mg/dL (ref 8.9–10.3)
Chloride: 105 mmol/L (ref 98–111)
Creatinine, Ser: 0.81 mg/dL (ref 0.44–1.00)
GFR, Estimated: >60 mL/min
Glucose, Bld: 208 mg/dL — ABNORMAL HIGH (ref 70–99)
Potassium: 4.2 mmol/L (ref 3.5–5.1)
Sodium: 140 mmol/L (ref 135–145)

## 2024-05-15 LAB — CBC WITH DIFFERENTIAL/PLATELET
Abs Immature Granulocytes: 0.03 K/uL (ref 0.00–0.07)
Basophils Absolute: 0 K/uL (ref 0.0–0.1)
Basophils Relative: 1 %
Eosinophils Absolute: 0.1 K/uL (ref 0.0–0.5)
Eosinophils Relative: 1 %
HCT: 42.2 % (ref 36.0–46.0)
Hemoglobin: 13.9 g/dL (ref 12.0–15.0)
Immature Granulocytes: 1 %
Lymphocytes Relative: 19 %
Lymphs Abs: 1.1 K/uL (ref 0.7–4.0)
MCH: 30 pg (ref 26.0–34.0)
MCHC: 32.9 g/dL (ref 30.0–36.0)
MCV: 91.1 fL (ref 80.0–100.0)
Monocytes Absolute: 0.4 K/uL (ref 0.1–1.0)
Monocytes Relative: 6 %
Neutro Abs: 4.2 K/uL (ref 1.7–7.7)
Neutrophils Relative %: 72 %
Platelets: 210 K/uL (ref 150–400)
RBC: 4.63 MIL/uL (ref 3.87–5.11)
RDW: 14.2 % (ref 11.5–15.5)
WBC: 5.7 K/uL (ref 4.0–10.5)
nRBC: 0 % (ref 0.0–0.2)

## 2024-05-15 MED ORDER — IBUPROFEN 200 MG PO TABS
600.0000 mg | ORAL_TABLET | Freq: Once | ORAL | Status: AC
  Administered 2024-05-15: 600 mg via ORAL
  Filled 2024-05-15: qty 3

## 2024-05-15 MED ORDER — IOHEXOL 300 MG/ML  SOLN: 100.0000 mL | Freq: Once | INTRAMUSCULAR | Status: DC | PRN

## 2024-05-15 MED ORDER — TRAMADOL HCL 50 MG PO TABS: 50.0000 mg | ORAL_TABLET | Freq: Three times a day (TID) | ORAL | 0 refills | Status: DC | PRN

## 2024-05-15 MED ORDER — CYCLOBENZAPRINE HCL 10 MG PO TABS
5.0000 mg | ORAL_TABLET | Freq: Once | ORAL | Status: AC
  Administered 2024-05-15: 5 mg via ORAL
  Filled 2024-05-15: qty 1

## 2024-05-15 MED ORDER — CYCLOBENZAPRINE HCL 5 MG PO TABS: 5.0000 mg | ORAL_TABLET | Freq: Two times a day (BID) | ORAL | 0 refills | Status: DC | PRN

## 2024-05-15 MED ORDER — TRAMADOL HCL 50 MG PO TABS
50.0000 mg | ORAL_TABLET | Freq: Once | ORAL | Status: AC
  Administered 2024-05-15: 50 mg via ORAL
  Filled 2024-05-15: qty 1

## 2024-05-15 MED ORDER — IBUPROFEN 600 MG PO TABS: 600.0000 mg | ORAL_TABLET | Freq: Four times a day (QID) | ORAL | 0 refills | Status: DC | PRN
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
## 2024-05-15 NOTE — Progress Notes (Signed)
 Orthopedic Tech Progress Note Patient Details:  Jasmine Lopez 11-03-1940 [id number]  Ortho Devices Type of Ortho Device: CAM walker Ortho Device/Splint Location: RLE Ortho Device/Splint Interventions: Application   Post Interventions Patient Tolerated: Well  Massie BRAVO Jasmine Lopez 05/15/2024, 3:01 PM
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
## 2024-05-16 ENCOUNTER — Ambulatory Visit: Payer: Self-pay

## 2024-05-16 NOTE — Telephone Encounter (Signed)
 Called [facility]  spoke with Jonell and reported decline ER and patient request for ER f/u and ortho referral
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
## 2024-05-19 ENCOUNTER — Encounter: Payer: Self-pay | Admitting: Nurse Practitioner

## 2024-05-19 ENCOUNTER — Telehealth: Payer: Self-pay | Admitting: Nurse Practitioner

## 2024-05-19 DIAGNOSIS — Z09 Encounter for follow-up examination after completed treatment for conditions other than malignant neoplasm: Secondary | ICD-10-CM

## 2024-05-19 DIAGNOSIS — S82831G Other fracture of upper and lower end of right fibula, subsequent encounter for closed fracture with delayed healing: Secondary | ICD-10-CM | POA: Diagnosis not present

## 2024-05-19 DIAGNOSIS — S0083XA Contusion of other part of head, initial encounter: Secondary | ICD-10-CM | POA: Insufficient documentation

## 2024-05-19 DIAGNOSIS — M79644 Pain in right finger(s): Secondary | ICD-10-CM | POA: Insufficient documentation

## 2024-05-19 DIAGNOSIS — S0083XD Contusion of other part of head, subsequent encounter: Secondary | ICD-10-CM | POA: Diagnosis not present

## 2024-05-19 MED ORDER — TRAMADOL HCL 50 MG PO TABS: 50.0000 mg | ORAL_TABLET | Freq: Three times a day (TID) | ORAL | 0 refills | Status: AC | PRN

## 2024-05-19 MED ORDER — CYCLOBENZAPRINE HCL 5 MG PO TABS: 5.0000 mg | ORAL_TABLET | Freq: Two times a day (BID) | ORAL | 0 refills | Status: AC | PRN

## 2024-05-19 MED ORDER — IBUPROFEN 600 MG PO TABS: 600.0000 mg | ORAL_TABLET | Freq: Four times a day (QID) | ORAL | 0 refills | Status: AC | PRN

## 2024-05-23 NOTE — Assessment & Plan Note (Signed)
 She is now having pain to her 3rd finger on right hand. Will order an xray. I can not see any obvious swelling.
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
## 2024-05-24 ENCOUNTER — Telehealth: Payer: Self-pay

## 2024-05-24 NOTE — Telephone Encounter (Signed)
 Patient called again and she states that [facility] will not be able to take her as a patient because they do not provide the services that she needs so she wanted a referral for [facility] because her insurance will cover according to patient.   I did explain that the referral will not be able to be placed until tomorrow 05/25/24 so that will delay the referral process ask the patient to allow 24 hours before she hears anything from [facility]
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
## 2024-05-26 NOTE — Telephone Encounter (Signed)
 Jasmine Lopez with [facility] called in wanting to speak with the referral coordinator about an order that was received. I called and spoke with Cecil and she transferred me to Ciera, Ciera then had me transfer Jasmine Lopez for further assistance.

## 2024-05-27 ENCOUNTER — Telehealth: Payer: Self-pay
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
## 2024-06-08 ENCOUNTER — Ambulatory Visit: Admitting: Podiatry

## 2024-06-08 ENCOUNTER — Encounter: Payer: Self-pay | Admitting: Podiatry

## 2024-06-08 DIAGNOSIS — E1169 Type 2 diabetes mellitus with other specified complication: Secondary | ICD-10-CM

## 2024-06-08 DIAGNOSIS — M79675 Pain in left toe(s): Secondary | ICD-10-CM

## 2024-06-08 DIAGNOSIS — E785 Hyperlipidemia, unspecified: Secondary | ICD-10-CM | POA: Diagnosis not present

## 2024-06-08 DIAGNOSIS — B351 Tinea unguium: Secondary | ICD-10-CM | POA: Diagnosis not present

## 2024-06-08 DIAGNOSIS — M79674 Pain in right toe(s): Secondary | ICD-10-CM
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
## 2024-06-10 ENCOUNTER — Ambulatory Visit: Payer: Self-pay

## 2024-06-10 VITALS — BP 122/80 | HR 70 | Temp 98.4°F | Ht 63.0 in | Wt 191.8 lb

## 2024-06-10 DIAGNOSIS — J069 Acute upper respiratory infection, unspecified: Secondary | ICD-10-CM

## 2024-06-10 DIAGNOSIS — J3489 Other specified disorders of nose and nasal sinuses: Secondary | ICD-10-CM

## 2024-06-10 DIAGNOSIS — Z1159 Encounter for screening for other viral diseases: Secondary | ICD-10-CM

## 2024-06-10 DIAGNOSIS — R062 Wheezing: Secondary | ICD-10-CM

## 2024-06-10 LAB — POCT XPERT XPRESS SARS COVID-2/FLU/RSV
FLU A: NEGATIVE
FLU B: NEGATIVE
SARS Coronavirus 2: NEGATIVE

## 2024-06-10 MED ORDER — ALBUTEROL SULFATE HFA 108 (90 BASE) MCG/ACT IN AERS: 2.0000 | INHALATION_SPRAY | Freq: Four times a day (QID) | RESPIRATORY_TRACT | 1 refills | Status: AC | PRN

## 2024-06-10 NOTE — Progress Notes (Signed)
 "  Acute Office Visit  Subjective:     Patient ID: Jasmine Lopez, female    DOB: [birth year], 84 y.o.   MRN: [id number]  Chief Complaint  Patient presents with   Cough    Cough and congestion started Monday and has gotten worse. She has also noticed some wheezing in her chest    This is a 84 year old African-American female who is here with her son today because she has had a deep cough for the past 4-5 days, chest congestion, and a slight beige color productive cough this morning.  Her son has been giving her both DayQuil and NyQuil.  She has mild shortness of breath with exertion and coughing that is new.  She has been wheezing with coughing.  She does feel somewhat lightheaded and has had a runny nose.  She does have sinus congestion.  She does have ENVIRONMENTAL ALLERGIES.  She does take over-the-counter Zyrtec but has not taken it lately.  She denies any headaches or sore throat.     Review of Systems  Constitutional:  Negative for chills and fever.  HENT:  Positive for congestion.   Respiratory:  Positive for cough, sputum production, shortness of breath and wheezing.   Neurological:  Positive for dizziness. Negative for headaches.        Objective:    Vitals:   06/10/24 1100  BP: 122/80  Pulse: 70  Temp: 98.4 F (36.9 C)  Height: 5' 3 (1.6 m)  Weight: 191 lb 12.8 oz (87 kg) Comment: Patient has on a boot  SpO2: 98%  BMI (Calculated): 33.98      Physical Exam Vitals reviewed.  Constitutional:      Appearance: Normal appearance.  HENT:     Head: Normocephalic and atraumatic.     Right Ear: A middle ear effusion is present. Tympanic membrane is not erythematous.     Left Ear: Tympanic membrane, ear canal and external ear normal.  No middle ear effusion. Tympanic membrane is not erythematous.     Nose:     Right Turbinates: Enlarged.     Right Sinus: No maxillary sinus tenderness or frontal sinus tenderness.     Left Sinus: Maxillary sinus tenderness  present. No frontal sinus tenderness.  Cardiovascular:     Rate and Rhythm: Normal rate and regular rhythm.     Pulses: Normal pulses.     Heart sounds: Normal heart sounds.  Pulmonary:     Effort: Pulmonary effort is normal.     Breath sounds: Wheezing present.     Comments: The presence of expiratory wheezing over the bilateral posterior lung fields right greater than left.  After nebulized treatment: the presence of expiratory wheezing over the bilateral posterior lung fields right greater than left. Musculoskeletal:     Cervical back: Normal range of motion and neck supple.  Neurological:     Mental Status: She is alert.     Results for orders placed or performed in visit on 06/10/24  POCT XPERT XPRESS SARS COVID-2/FLU/RSV [[id number]]  Result Value Ref Range   SARS Coronavirus 2 Negative    FLU A Negative    FLU B Negative    RSV RNA, PCR N/A         Assessment & Plan:   Assessment & Plan Encounter for screening for other viral diseases     The rapid COVID-19 test and the rapid influenza A/B test done on 06/10/2024 were negative. Orders:   POCT XPERT XPRESS SARS COVID-2/FLU/RSV [[id number]]  Sinus drainage     Wheezing on expiration This patient was given an albuterol  nebulized treatment on 06/10/2024.  Since this expiratory wheezing did not improve with the albuterol  inhaler she was prescribed an albuterol  inhaler on 06/10/2024.  I demonstrated her to her on how to use the albuterol  inhaler.  She was advised to call back if she develops fevers/chills, increased shortness of breath or coughing.  She voiced understanding.  Orders:   PR PRESSURIZED/NONPRESSURIZED INHALATION TREATMENT  Viral URI with cough I advised her on 06/10/2024 to get a sugar-free over-the-counter Robitussin DM and to begin taking zinc 50 mg once a day with food for 7 days.     Return if symptoms worsen or fail to improve.  Jasmine JONELLE Fischer, DO  "

## 2024-06-11 ENCOUNTER — Encounter: Payer: Self-pay | Admitting: Podiatry

## 2024-08-24 ENCOUNTER — Encounter: Payer: Self-pay | Admitting: Nurse Practitioner

## 2024-09-13 ENCOUNTER — Ambulatory Visit: Admitting: Podiatry

## 2025-01-25 ENCOUNTER — Ambulatory Visit: Payer: Self-pay
